# Patient Record
Sex: Female | Born: 1950 | Race: White | Hispanic: No | Marital: Married | Smoking: Never smoker
Health system: Southern US, Community
[De-identification: ages and names within clinical notes are randomized; demographics above are authoritative.]

## PROBLEM LIST (undated history)

## (undated) DIAGNOSIS — K589 Irritable bowel syndrome without diarrhea: Secondary | ICD-10-CM

## (undated) DIAGNOSIS — J45909 Unspecified asthma, uncomplicated: Secondary | ICD-10-CM

## (undated) DIAGNOSIS — C439 Malignant melanoma of skin, unspecified: Secondary | ICD-10-CM

## (undated) DIAGNOSIS — K8020 Calculus of gallbladder without cholecystitis without obstruction: Secondary | ICD-10-CM

## (undated) DIAGNOSIS — F329 Major depressive disorder, single episode, unspecified: Secondary | ICD-10-CM

## (undated) DIAGNOSIS — I493 Ventricular premature depolarization: Secondary | ICD-10-CM

## (undated) DIAGNOSIS — D649 Anemia, unspecified: Secondary | ICD-10-CM

## (undated) DIAGNOSIS — Z87440 Personal history of urinary (tract) infections: Secondary | ICD-10-CM

## (undated) DIAGNOSIS — M1990 Unspecified osteoarthritis, unspecified site: Secondary | ICD-10-CM

## (undated) DIAGNOSIS — N200 Calculus of kidney: Secondary | ICD-10-CM

## (undated) DIAGNOSIS — I10 Essential (primary) hypertension: Secondary | ICD-10-CM

## (undated) DIAGNOSIS — E119 Type 2 diabetes mellitus without complications: Secondary | ICD-10-CM

## (undated) DIAGNOSIS — M5030 Other cervical disc degeneration, unspecified cervical region: Secondary | ICD-10-CM

## (undated) DIAGNOSIS — K219 Gastro-esophageal reflux disease without esophagitis: Secondary | ICD-10-CM

## (undated) DIAGNOSIS — F32A Depression, unspecified: Secondary | ICD-10-CM

## (undated) DIAGNOSIS — Z86006 Personal history of melanoma in-situ: Secondary | ICD-10-CM

## (undated) DIAGNOSIS — Z8719 Personal history of other diseases of the digestive system: Secondary | ICD-10-CM

## (undated) DIAGNOSIS — B019 Varicella without complication: Secondary | ICD-10-CM

## (undated) DIAGNOSIS — E785 Hyperlipidemia, unspecified: Secondary | ICD-10-CM

## (undated) DIAGNOSIS — D049 Carcinoma in situ of skin, unspecified: Secondary | ICD-10-CM

## (undated) DIAGNOSIS — Z9889 Other specified postprocedural states: Secondary | ICD-10-CM

## (undated) DIAGNOSIS — E039 Hypothyroidism, unspecified: Secondary | ICD-10-CM

## (undated) DIAGNOSIS — T7840XA Allergy, unspecified, initial encounter: Secondary | ICD-10-CM

## (undated) HISTORY — DX: Other cervical disc degeneration, unspecified cervical region: M50.30

## (undated) HISTORY — DX: Personal history of urinary (tract) infections: Z87.440

## (undated) HISTORY — PX: RECTAL SURGERY: SHX760

## (undated) HISTORY — DX: Varicella without complication: B01.9

## (undated) HISTORY — DX: Other specified postprocedural states: Z98.890

## (undated) HISTORY — DX: Type 2 diabetes mellitus without complications: E11.9

## (undated) HISTORY — DX: Hypothyroidism, unspecified: E03.9

## (undated) HISTORY — DX: Carcinoma in situ of skin, unspecified: D04.9

## (undated) HISTORY — PX: COLONOSCOPY: SHX174

## (undated) HISTORY — PX: OTHER SURGICAL HISTORY: SHX169

## (undated) HISTORY — DX: Personal history of melanoma in-situ: Z86.006

## (undated) HISTORY — DX: Unspecified asthma, uncomplicated: J45.909

## (undated) HISTORY — DX: Calculus of kidney: N20.0

## (undated) HISTORY — DX: Malignant melanoma of skin, unspecified: C43.9

## (undated) HISTORY — DX: Irritable bowel syndrome, unspecified: K58.9

## (undated) HISTORY — DX: Personal history of other diseases of the digestive system: Z87.19

## (undated) HISTORY — DX: Gastro-esophageal reflux disease without esophagitis: K21.9

## (undated) HISTORY — DX: Allergy, unspecified, initial encounter: T78.40XA

## (undated) HISTORY — DX: Unspecified osteoarthritis, unspecified site: M19.90

## (undated) HISTORY — DX: Ventricular premature depolarization: I49.3

## (undated) HISTORY — PX: BREAST EXCISIONAL BIOPSY: SUR124

## (undated) HISTORY — DX: Hyperlipidemia, unspecified: E78.5

## (undated) HISTORY — DX: Depression, unspecified: F32.A

## (undated) HISTORY — DX: Essential (primary) hypertension: I10

## (undated) HISTORY — DX: Anemia, unspecified: D64.9

## (undated) HISTORY — DX: Calculus of gallbladder without cholecystitis without obstruction: K80.20

## (undated) HISTORY — DX: Major depressive disorder, single episode, unspecified: F32.9

---

## 1972-10-21 HISTORY — PX: ABDOMINAL HYSTERECTOMY: SHX81

## 1972-10-21 HISTORY — PX: BLADDER SURGERY: SHX569

## 1975-10-22 HISTORY — PX: APPENDECTOMY: SHX54

## 1985-10-21 HISTORY — PX: CHOLECYSTECTOMY: SHX55

## 1999-03-20 ENCOUNTER — Other Ambulatory Visit: Admission: RE | Admit: 1999-03-20 | Discharge: 1999-03-20 | Payer: Self-pay | Admitting: Family Medicine

## 1999-08-06 ENCOUNTER — Ambulatory Visit (HOSPITAL_COMMUNITY): Admission: RE | Admit: 1999-08-06 | Discharge: 1999-08-06 | Payer: Self-pay | Admitting: *Deleted

## 1999-08-06 ENCOUNTER — Encounter (INDEPENDENT_AMBULATORY_CARE_PROVIDER_SITE_OTHER): Payer: Self-pay

## 2000-07-01 ENCOUNTER — Other Ambulatory Visit: Admission: RE | Admit: 2000-07-01 | Discharge: 2000-07-01 | Payer: Self-pay | Admitting: *Deleted

## 2000-10-21 DIAGNOSIS — Z86006 Personal history of melanoma in-situ: Secondary | ICD-10-CM

## 2000-10-21 HISTORY — DX: Personal history of melanoma in-situ: Z86.006

## 2001-07-16 ENCOUNTER — Other Ambulatory Visit: Admission: RE | Admit: 2001-07-16 | Discharge: 2001-07-16 | Payer: Self-pay | Admitting: Obstetrics and Gynecology

## 2001-12-04 ENCOUNTER — Encounter: Admission: RE | Admit: 2001-12-04 | Discharge: 2001-12-04 | Payer: Self-pay | Admitting: Internal Medicine

## 2001-12-04 ENCOUNTER — Encounter: Payer: Self-pay | Admitting: Internal Medicine

## 2002-09-14 ENCOUNTER — Encounter (INDEPENDENT_AMBULATORY_CARE_PROVIDER_SITE_OTHER): Payer: Self-pay | Admitting: Specialist

## 2002-09-14 ENCOUNTER — Ambulatory Visit (HOSPITAL_BASED_OUTPATIENT_CLINIC_OR_DEPARTMENT_OTHER): Admission: RE | Admit: 2002-09-14 | Discharge: 2002-09-14 | Payer: Self-pay | Admitting: Plastic Surgery

## 2003-01-11 ENCOUNTER — Encounter: Payer: Self-pay | Admitting: Internal Medicine

## 2003-01-11 ENCOUNTER — Encounter: Admission: RE | Admit: 2003-01-11 | Discharge: 2003-01-11 | Payer: Self-pay | Admitting: Internal Medicine

## 2003-02-28 ENCOUNTER — Encounter: Admission: RE | Admit: 2003-02-28 | Discharge: 2003-02-28 | Payer: Self-pay | Admitting: Internal Medicine

## 2003-02-28 ENCOUNTER — Encounter: Payer: Self-pay | Admitting: Internal Medicine

## 2003-03-10 ENCOUNTER — Ambulatory Visit (HOSPITAL_BASED_OUTPATIENT_CLINIC_OR_DEPARTMENT_OTHER): Admission: RE | Admit: 2003-03-10 | Discharge: 2003-03-10 | Payer: Self-pay | Admitting: Urology

## 2004-02-23 ENCOUNTER — Other Ambulatory Visit: Admission: RE | Admit: 2004-02-23 | Discharge: 2004-02-23 | Payer: Self-pay | Admitting: Internal Medicine

## 2004-08-17 ENCOUNTER — Encounter: Admission: RE | Admit: 2004-08-17 | Discharge: 2004-08-17 | Payer: Self-pay | Admitting: Dermatology

## 2005-12-11 ENCOUNTER — Encounter: Admission: RE | Admit: 2005-12-11 | Discharge: 2005-12-11 | Payer: Self-pay | Admitting: Internal Medicine

## 2005-12-27 ENCOUNTER — Ambulatory Visit: Payer: Self-pay | Admitting: Internal Medicine

## 2006-01-07 ENCOUNTER — Ambulatory Visit: Payer: Self-pay | Admitting: Internal Medicine

## 2006-01-15 ENCOUNTER — Ambulatory Visit: Payer: Self-pay | Admitting: Internal Medicine

## 2006-02-17 ENCOUNTER — Ambulatory Visit: Payer: Self-pay | Admitting: Internal Medicine

## 2006-05-21 ENCOUNTER — Ambulatory Visit: Payer: Self-pay | Admitting: Family Medicine

## 2006-07-10 ENCOUNTER — Ambulatory Visit: Payer: Self-pay | Admitting: Family Medicine

## 2007-03-12 LAB — HM MAMMOGRAPHY

## 2007-05-25 ENCOUNTER — Encounter: Payer: Self-pay | Admitting: Family Medicine

## 2007-05-25 DIAGNOSIS — Z87442 Personal history of urinary calculi: Secondary | ICD-10-CM

## 2007-05-25 DIAGNOSIS — M1990 Unspecified osteoarthritis, unspecified site: Secondary | ICD-10-CM | POA: Insufficient documentation

## 2007-05-25 DIAGNOSIS — I10 Essential (primary) hypertension: Secondary | ICD-10-CM

## 2007-05-25 DIAGNOSIS — Z8739 Personal history of other diseases of the musculoskeletal system and connective tissue: Secondary | ICD-10-CM

## 2007-06-04 ENCOUNTER — Ambulatory Visit: Payer: Self-pay | Admitting: Family Medicine

## 2007-06-04 ENCOUNTER — Encounter: Payer: Self-pay | Admitting: Family Medicine

## 2007-06-04 ENCOUNTER — Other Ambulatory Visit: Admission: RE | Admit: 2007-06-04 | Discharge: 2007-06-04 | Payer: Self-pay | Admitting: Family Medicine

## 2007-06-04 DIAGNOSIS — H838X9 Other specified diseases of inner ear, unspecified ear: Secondary | ICD-10-CM

## 2007-06-04 DIAGNOSIS — F410 Panic disorder [episodic paroxysmal anxiety] without agoraphobia: Secondary | ICD-10-CM | POA: Insufficient documentation

## 2007-06-09 ENCOUNTER — Encounter: Payer: Self-pay | Admitting: Family Medicine

## 2007-06-10 LAB — CONVERTED CEMR LAB
ALT: 27 units/L (ref 0–35)
Albumin: 4 g/dL (ref 3.5–5.2)
Alkaline Phosphatase: 76 units/L (ref 39–117)
BUN: 6 mg/dL (ref 6–23)
Basophils Absolute: 0 10*3/uL (ref 0.0–0.1)
Basophils Relative: 0.5 % (ref 0.0–1.0)
Bilirubin, Direct: 0.1 mg/dL (ref 0.0–0.3)
Calcium: 9.4 mg/dL (ref 8.4–10.5)
Cholesterol: 187 mg/dL (ref 0–200)
Eosinophils Absolute: 0.1 10*3/uL (ref 0.0–0.6)
Eosinophils Relative: 2.1 % (ref 0.0–5.0)
GFR calc Af Amer: 111 mL/min
GFR calc non Af Amer: 92 mL/min
HCT: 40.6 % (ref 36.0–46.0)
Hemoglobin: 13.7 g/dL (ref 12.0–15.0)
LDL Cholesterol: 118 mg/dL — ABNORMAL HIGH (ref 0–99)
Lymphocytes Relative: 30.3 % (ref 12.0–46.0)
Monocytes Absolute: 0.7 10*3/uL (ref 0.2–0.7)
Neutro Abs: 2.5 10*3/uL (ref 1.4–7.7)
Neutrophils Relative %: 52 % (ref 43.0–77.0)
Potassium: 3.8 meq/L (ref 3.5–5.1)
RDW: 12.7 % (ref 11.5–14.6)
Total CHOL/HDL Ratio: 3.6
Total Protein: 7.2 g/dL (ref 6.0–8.3)
VLDL: 17 mg/dL (ref 0–40)

## 2007-07-15 ENCOUNTER — Telehealth: Payer: Self-pay

## 2007-07-22 ENCOUNTER — Telehealth: Payer: Self-pay | Admitting: Family Medicine

## 2007-09-10 ENCOUNTER — Ambulatory Visit: Payer: Self-pay | Admitting: Family Medicine

## 2007-12-04 ENCOUNTER — Encounter: Payer: Self-pay | Admitting: Family Medicine

## 2007-12-04 ENCOUNTER — Telehealth: Payer: Self-pay | Admitting: Internal Medicine

## 2007-12-04 ENCOUNTER — Ambulatory Visit: Payer: Self-pay | Admitting: Internal Medicine

## 2007-12-04 LAB — CONVERTED CEMR LAB
BUN: 8 mg/dL (ref 6–23)
CO2: 31 meq/L (ref 19–32)
Calcium: 9.8 mg/dL (ref 8.4–10.5)
Glucose, Bld: 88 mg/dL (ref 70–99)
Potassium: 4.4 meq/L (ref 3.5–5.1)
Sodium: 143 meq/L (ref 135–145)
T3, Free: 3 pg/mL (ref 2.3–4.2)

## 2007-12-15 ENCOUNTER — Encounter: Payer: Self-pay | Admitting: Pulmonary Disease

## 2007-12-15 ENCOUNTER — Encounter: Payer: Self-pay | Admitting: Family Medicine

## 2007-12-15 ENCOUNTER — Ambulatory Visit: Payer: Self-pay

## 2007-12-15 ENCOUNTER — Encounter: Payer: Self-pay | Admitting: Internal Medicine

## 2007-12-16 ENCOUNTER — Ambulatory Visit: Payer: Self-pay | Admitting: Family Medicine

## 2007-12-16 DIAGNOSIS — E039 Hypothyroidism, unspecified: Secondary | ICD-10-CM | POA: Insufficient documentation

## 2007-12-16 LAB — CONVERTED CEMR LAB: Vit D, 1,25-Dihydroxy: 41 (ref 30–89)

## 2008-01-14 ENCOUNTER — Ambulatory Visit: Payer: Self-pay | Admitting: Family Medicine

## 2008-01-18 LAB — CONVERTED CEMR LAB: TSH: 3.73 microintl units/mL (ref 0.35–5.50)

## 2008-02-01 ENCOUNTER — Telehealth: Payer: Self-pay | Admitting: Family Medicine

## 2008-03-24 ENCOUNTER — Telehealth: Payer: Self-pay | Admitting: Family Medicine

## 2008-03-31 ENCOUNTER — Ambulatory Visit: Payer: Self-pay | Admitting: Family Medicine

## 2008-03-31 DIAGNOSIS — I479 Paroxysmal tachycardia, unspecified: Secondary | ICD-10-CM | POA: Insufficient documentation

## 2008-04-13 ENCOUNTER — Telehealth: Payer: Self-pay | Admitting: Family Medicine

## 2008-04-13 ENCOUNTER — Encounter: Payer: Self-pay | Admitting: Family Medicine

## 2008-05-05 ENCOUNTER — Encounter: Payer: Self-pay | Admitting: Family Medicine

## 2008-05-05 ENCOUNTER — Encounter: Admission: RE | Admit: 2008-05-05 | Discharge: 2008-05-05 | Payer: Self-pay | Admitting: Dermatology

## 2008-05-19 ENCOUNTER — Ambulatory Visit: Payer: Self-pay | Admitting: Family Medicine

## 2008-05-19 DIAGNOSIS — R079 Chest pain, unspecified: Secondary | ICD-10-CM | POA: Insufficient documentation

## 2008-05-19 DIAGNOSIS — F419 Anxiety disorder, unspecified: Secondary | ICD-10-CM | POA: Insufficient documentation

## 2008-05-19 DIAGNOSIS — R0602 Shortness of breath: Secondary | ICD-10-CM

## 2008-05-19 DIAGNOSIS — F438 Other reactions to severe stress: Secondary | ICD-10-CM

## 2008-05-19 DIAGNOSIS — H04129 Dry eye syndrome of unspecified lacrimal gland: Secondary | ICD-10-CM | POA: Insufficient documentation

## 2008-05-25 ENCOUNTER — Telehealth: Payer: Self-pay | Admitting: Family Medicine

## 2008-06-09 ENCOUNTER — Ambulatory Visit: Payer: Self-pay | Admitting: Family Medicine

## 2008-06-16 ENCOUNTER — Telehealth: Payer: Self-pay | Admitting: Family Medicine

## 2008-06-22 ENCOUNTER — Encounter: Payer: Self-pay | Admitting: Family Medicine

## 2008-06-22 ENCOUNTER — Ambulatory Visit: Payer: Self-pay

## 2008-06-23 ENCOUNTER — Ambulatory Visit: Payer: Self-pay | Admitting: Family Medicine

## 2008-06-28 ENCOUNTER — Telehealth: Payer: Self-pay

## 2008-06-29 ENCOUNTER — Telehealth: Payer: Self-pay | Admitting: Critical Care Medicine

## 2008-06-29 ENCOUNTER — Ambulatory Visit: Payer: Self-pay | Admitting: Family Medicine

## 2008-06-29 ENCOUNTER — Telehealth: Payer: Self-pay | Admitting: Family Medicine

## 2008-06-29 DIAGNOSIS — R062 Wheezing: Secondary | ICD-10-CM

## 2008-06-30 ENCOUNTER — Ambulatory Visit: Payer: Self-pay | Admitting: Pulmonary Disease

## 2008-07-08 ENCOUNTER — Ambulatory Visit: Payer: Self-pay | Admitting: Pulmonary Disease

## 2008-07-11 ENCOUNTER — Telehealth (INDEPENDENT_AMBULATORY_CARE_PROVIDER_SITE_OTHER): Payer: Self-pay | Admitting: *Deleted

## 2008-07-15 ENCOUNTER — Ambulatory Visit: Payer: Self-pay | Admitting: Pulmonary Disease

## 2008-08-05 ENCOUNTER — Telehealth: Payer: Self-pay | Admitting: Family Medicine

## 2008-08-09 ENCOUNTER — Encounter: Payer: Self-pay | Admitting: Family Medicine

## 2008-08-30 ENCOUNTER — Encounter: Payer: Self-pay | Admitting: Family Medicine

## 2008-11-24 ENCOUNTER — Ambulatory Visit: Payer: Self-pay | Admitting: Family Medicine

## 2008-11-24 DIAGNOSIS — Z9109 Other allergy status, other than to drugs and biological substances: Secondary | ICD-10-CM

## 2008-12-05 ENCOUNTER — Telehealth: Payer: Self-pay

## 2009-03-24 ENCOUNTER — Encounter: Payer: Self-pay | Admitting: Family Medicine

## 2009-06-29 ENCOUNTER — Encounter: Payer: Self-pay | Admitting: Family Medicine

## 2009-07-14 ENCOUNTER — Encounter: Payer: Self-pay | Admitting: Family Medicine

## 2009-07-15 ENCOUNTER — Encounter: Payer: Self-pay | Admitting: Family Medicine

## 2009-07-17 ENCOUNTER — Telehealth: Payer: Self-pay | Admitting: Family Medicine

## 2009-07-20 ENCOUNTER — Ambulatory Visit: Payer: Self-pay | Admitting: Family Medicine

## 2009-07-20 DIAGNOSIS — E876 Hypokalemia: Secondary | ICD-10-CM

## 2009-07-28 LAB — CONVERTED CEMR LAB
Chloride: 103 meq/L (ref 96–112)
Sodium: 141 meq/L (ref 135–145)

## 2009-08-17 ENCOUNTER — Encounter: Payer: Self-pay | Admitting: Family Medicine

## 2009-08-17 ENCOUNTER — Ambulatory Visit: Payer: Self-pay | Admitting: Family Medicine

## 2009-08-17 ENCOUNTER — Other Ambulatory Visit: Admission: RE | Admit: 2009-08-17 | Discharge: 2009-08-17 | Payer: Self-pay | Admitting: Family Medicine

## 2009-08-17 LAB — CONVERTED CEMR LAB
Bilirubin Urine: NEGATIVE
Ketones, urine, test strip: NEGATIVE
Protein, U semiquant: NEGATIVE
WBC Urine, dipstick: NEGATIVE
pH: 7

## 2009-08-17 LAB — HM PAP SMEAR

## 2009-08-21 LAB — CONVERTED CEMR LAB: Vit D, 25-Hydroxy: 36 ng/mL (ref 30–89)

## 2009-08-22 ENCOUNTER — Telehealth: Payer: Self-pay | Admitting: Family Medicine

## 2009-08-23 ENCOUNTER — Encounter: Payer: Self-pay | Admitting: Family Medicine

## 2009-08-24 LAB — CONVERTED CEMR LAB
ALT: 20 units/L (ref 0–35)
Alkaline Phosphatase: 62 units/L (ref 39–117)
Basophils Absolute: 0 10*3/uL (ref 0.0–0.1)
Bilirubin, Direct: 0.1 mg/dL (ref 0.0–0.3)
CO2: 30 meq/L (ref 19–32)
Chloride: 100 meq/L (ref 96–112)
Cholesterol: 221 mg/dL — ABNORMAL HIGH (ref 0–200)
Creatinine, Ser: 0.6 mg/dL (ref 0.4–1.2)
HCT: 41.7 % (ref 36.0–46.0)
Hemoglobin: 14.1 g/dL (ref 12.0–15.0)
MCV: 91.8 fL (ref 78.0–100.0)
Neutrophils Relative %: 52.2 % (ref 43.0–77.0)
Platelets: 192 10*3/uL (ref 150.0–400.0)
RBC: 4.54 M/uL (ref 3.87–5.11)
RDW: 12.7 % (ref 11.5–14.6)
Sodium: 138 meq/L (ref 135–145)
Total CHOL/HDL Ratio: 4
Total Protein: 7.7 g/dL (ref 6.0–8.3)

## 2009-08-31 ENCOUNTER — Telehealth: Payer: Self-pay | Admitting: Family Medicine

## 2009-09-21 ENCOUNTER — Telehealth: Payer: Self-pay | Admitting: Family Medicine

## 2009-09-26 ENCOUNTER — Telehealth: Payer: Self-pay | Admitting: Family Medicine

## 2009-10-04 ENCOUNTER — Ambulatory Visit: Payer: Self-pay | Admitting: Family Medicine

## 2009-10-04 DIAGNOSIS — R609 Edema, unspecified: Secondary | ICD-10-CM | POA: Insufficient documentation

## 2009-10-05 ENCOUNTER — Encounter: Payer: Self-pay | Admitting: Family Medicine

## 2009-10-05 ENCOUNTER — Telehealth: Payer: Self-pay | Admitting: Family Medicine

## 2009-10-06 LAB — CONVERTED CEMR LAB: Potassium: 4.2 meq/L (ref 3.5–5.3)

## 2009-10-10 ENCOUNTER — Telehealth: Payer: Self-pay | Admitting: Family Medicine

## 2009-10-11 ENCOUNTER — Encounter: Payer: Self-pay | Admitting: Family Medicine

## 2009-10-12 ENCOUNTER — Encounter: Payer: Self-pay | Admitting: Family Medicine

## 2009-11-09 ENCOUNTER — Encounter: Payer: Self-pay | Admitting: Family Medicine

## 2009-11-14 ENCOUNTER — Encounter: Payer: Self-pay | Admitting: Family Medicine

## 2009-11-21 ENCOUNTER — Telehealth: Payer: Self-pay | Admitting: Family Medicine

## 2009-11-22 ENCOUNTER — Encounter: Payer: Self-pay | Admitting: Family Medicine

## 2009-11-27 ENCOUNTER — Encounter: Payer: Self-pay | Admitting: Family Medicine

## 2009-11-28 ENCOUNTER — Encounter: Payer: Self-pay | Admitting: Family Medicine

## 2009-12-04 ENCOUNTER — Encounter: Payer: Self-pay | Admitting: Family Medicine

## 2009-12-06 ENCOUNTER — Encounter: Payer: Self-pay | Admitting: Family Medicine

## 2010-01-01 ENCOUNTER — Telehealth (INDEPENDENT_AMBULATORY_CARE_PROVIDER_SITE_OTHER): Payer: Self-pay | Admitting: *Deleted

## 2010-01-03 ENCOUNTER — Ambulatory Visit (HOSPITAL_COMMUNITY): Admission: RE | Admit: 2010-01-03 | Discharge: 2010-01-03 | Payer: Self-pay | Admitting: General Surgery

## 2010-01-03 ENCOUNTER — Encounter: Payer: Self-pay | Admitting: Family Medicine

## 2010-01-16 ENCOUNTER — Encounter: Payer: Self-pay | Admitting: Family Medicine

## 2010-02-14 ENCOUNTER — Telehealth: Payer: Self-pay | Admitting: Family Medicine

## 2010-05-31 ENCOUNTER — Ambulatory Visit: Payer: Self-pay | Admitting: Family Medicine

## 2010-06-06 ENCOUNTER — Telehealth: Payer: Self-pay | Admitting: Family Medicine

## 2010-06-07 ENCOUNTER — Ambulatory Visit: Payer: Self-pay | Admitting: Family Medicine

## 2010-06-07 DIAGNOSIS — M79609 Pain in unspecified limb: Secondary | ICD-10-CM | POA: Insufficient documentation

## 2010-06-07 LAB — CONVERTED CEMR LAB: Potassium: 3.9 meq/L (ref 3.5–5.1)

## 2010-06-13 ENCOUNTER — Telehealth: Payer: Self-pay | Admitting: Family Medicine

## 2010-06-22 ENCOUNTER — Encounter: Payer: Self-pay | Admitting: Family Medicine

## 2010-07-20 ENCOUNTER — Encounter: Payer: Self-pay | Admitting: Family Medicine

## 2010-08-20 ENCOUNTER — Telehealth: Payer: Self-pay | Admitting: Family Medicine

## 2010-11-10 ENCOUNTER — Encounter: Payer: Self-pay | Admitting: Internal Medicine

## 2010-11-19 ENCOUNTER — Telehealth: Payer: Self-pay | Admitting: Family Medicine

## 2010-11-20 NOTE — Progress Notes (Signed)
Summary: meds are working  Phone Note Call from Patient   Caller: Patient Call For: Judithann Sheen MD Summary of Call: Pt is calling to let Dr.Stafford the Diazepam is working.  [phone] Initial call taken by: Lynann Beaver CMA,  June 13, 2010 12:22 PM  Follow-up for Phone Call        please denies panic or Follow-up by: Judithann Sheen MD,  June 17, 2010 2:11 PM

## 2010-11-20 NOTE — Progress Notes (Signed)
  Phone Note From Other Clinic   Caller: [facility] Details for Reason: Pt.Information Initial call taken by: Joice Lofts LOV,Stress over to Rosalio Macadamia @ [phone] [facility]  January 01, 2010 9:25 AM

## 2010-11-20 NOTE — Progress Notes (Signed)
Summary: lab results  Phone Note Call from Patient   Caller: Patient Call For: Judithann Sheen MD Summary of Call: [phone] Needs lab results Initial call taken by: [facility] CMA,  June 06, 2010 11:05 AM  Follow-up for Phone Call        k+ 3.9 and is ok.  Follow-up by: Pura Spice, RN,  June 06, 2010 2:07 PM     Appended Document: lab results pt aware..........gh rn......Marland Kitchen

## 2010-11-20 NOTE — Op Note (Signed)
Summary: Ultrasound Guided Core Biopsy of Breast/[facility]  Ultrasound Guided Core Biopsy of Breast/[facility]   Imported By: Maryln Gottron 12/27/2009 14:11:21  _____________________________________________________________________  External Attachment:    Type:   Image     Comment:   External Document

## 2010-11-20 NOTE — Assessment & Plan Note (Signed)
Summary: LEG PAIN/NJR   Vital Signs:  Patient profile:   60 year old female Menstrual status:  postmenopausal Weight:      163 pounds BMI:     28.08 O2 Sat:      98 % Temp:     98.3 degrees F Pulse rate:   62 / minute Pulse rhythm:   regular BP sitting:   130 / 80  (left arm)  Vitals Entered By: Pura Spice, RN (June 07, 2010 1:11 PM) CC: c/o legs aching    History of Present Illness: This 60 year old white married female is complaining of leg pain as well as some muscle like to pain all over ear does have cramping muscle pain has increased attachment site which has not helped Blood pressure then well controlled 2 get lab studies to help evaluate etiology of muscle cramps Continues to take Restasis for dry eyes Does not have panic attacks as in the past Rarely has episodes of labyrinthitis Russell edema is related to long she takes her diuretic Continues to have anxiety and feels stressed  Allergies: 1)  ! Codeine Phosphate (Codeine Phosphate) 2)  ! Iodine (Iodine)  Past History:  Past Medical History: Last updated: 05/25/2007 hx melanoma  2002 Hypertension hx kidney calculi Nephrolithiasis, hx of Osteoarthritis  Past Surgical History: Last updated: 05/25/2007 Appendectomy  1981 Cholecystectomy  1991 Hysterectomy  1973  Social History: Last updated: 06/30/2008 40  hours work Never Smoked 1/2 c -1 perday Alcohol use-no Married 6-7 hours.  pt is a lic. optician.  Risk Factors: Smoking Status: never (08/17/2009)  Review of Systems      See HPI  The patient denies anorexia, fever, weight loss, weight gain, vision loss, decreased hearing, hoarseness, chest pain, syncope, dyspnea on exertion, peripheral edema, prolonged cough, headaches, hemoptysis, abdominal pain, melena, hematochezia, severe indigestion/heartburn, hematuria, incontinence, genital sores, muscle weakness, suspicious skin lesions, transient blindness, difficulty walking, depression,  unusual weight change, abnormal bleeding, enlarged lymph nodes, angioedema, breast masses, and testicular masses.    Physical Exam  General:  Well-developed,well-nourished,in no acute distress; alert,appropriate and cooperative throughout examination Head:  Normocephalic and atraumatic without obvious abnormalities. No apparent alopecia or balding. Eyes:  No corneal or conjunctival inflammation noted. EOMI. Perrla. Funduscopic exam benign, without hemorrhages, exudates or papilledema. Vision grossly normal. Ears:  External ear exam shows no significant lesions or deformities.  Otoscopic examination reveals clear canals, tympanic membranes are intact bilaterally without bulging, retraction, inflammation or discharge. Hearing is grossly normal bilaterally. Nose:  External nasal examination shows no deformity or inflammation. Nasal mucosa are pink and moist without lesions or exudates. Mouth:  Oral mucosa and oropharynx without lesions or exudates.  Teeth in good repair. Neck:  No deformities, masses, or tenderness noted. Lungs:  Normal respiratory effort, chest expands symmetrically. Lungs are clear to auscultation, no crackles or wheezes. Heart:  Normal rate and regular rhythm. S1 and S2 normal without gallop, murmur, click, rub or other extra sounds. Abdomen:  Bowel sounds positive,abdomen soft and non-tender without masses, organomegaly or hernias noted. Extremities:  trace of pretibial edema bilaterally Neurologic:  No cranial nerve deficits noted. Station and gait are normal. Plantar reflexes are down-going bilaterally. DTRs are symmetrical throughout. Sensory, motor and coordinative functions appear intact.   Impression & Recommendations:  Problem # 1:  LEG PAIN, BILATERAL (ICD-729.5) Assessment New diazepam 5 mg t.i.d.  Problem # 2:  LEG EDEMA, BILATERAL (ICD-782.3) Assessment: Improved  The following medications were removed from the medication list:  Maxzide-25 37.5-25 Mg Tabs  (Triamterene-hctz) .Marland Kitchen... 23 stat then 1 once daily to pre vent fluid retention Her updated medication list for this problem includes:    Hydrochlorothiazide 25 Mg Tabs (Hydrochlorothiazide) .Marland Kitchen... 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  Problem # 4:  OSTEOARTHRITIS (ICD-715.90)  The following medications were removed from the medication list:    Ibuprofen 800 Mg Tabs (Ibuprofen) .Marland Kitchen... 1 three times a day pc for leg pains  Problem # 5:  WHEEZING (ICD-786.07) Assessment: Improved ProAir as needed  Problem # 6:  ANXIETY, SITUATIONAL (ICD-308.3) Assessment: Unchanged diazepam 5 mg t.i.d.  Problem # 7:  HYPERTENSION (ICD-401.9) Assessment: Improved  The following medications were removed from the medication list:    Catapres 0.1 Mg Tabs (Clonidine hcl) .Marland Kitchen... 1 tab q4-6 h prnelevated  blood presure    Maxzide-25 37.5-25 Mg Tabs (Triamterene-hctz) .Marland Kitchen... 23 stat then 1 once daily to pre vent fluid retention    Avapro 300 Mg Tabs (Irbesartan) .Marland Kitchen... 1 once daily for blood pressure Her updated medication list for this problem includes:    Hydrochlorothiazide 25 Mg Tabs (Hydrochlorothiazide) .Marland Kitchen... 1 once daily for bp    Bystolic 5 Mg Tabs (Nebivolol hcl) .Marland Kitchen... Take 1 by mouth once daily  Complete Medication List: 1)  Lorazepam 1 Mg Tabs (Lorazepam) .Marland Kitchen.. 1 by mouth three times a day as needed stress 2)  Restasis 0.05 % Emul (Cyclosporine) .... As needed 3)  Multivitamins Caps (Multiple vitamin) 4)  Fish Oil 1000 Mg Caps (Omega-3 fatty acids) 5)  Flax Seed Oil 1000 Mg Caps (Flaxseed (linseed)) 6)  Vivelle-dot 0.1 Mg/24hr Pttw (Estradiol) .... Apply 1 patch twcie week 7)  Acyclovir 400 Mg Tabs (Acyclovir) .Marland Kitchen.. 1 tab once daily 8)  Qvar 80 Mcg/act Aers (Beclomethasone dipropionate) 9)  Nasonex 50 Mcg/act Susp (Mometasone furoate) 10)  Proair Hfa 108 (90 Base) Mcg/act Aers (Albuterol sulfate) 11)  Kaon-cl-10 10 Meq Cr-tabs  (Potassium chloride) .Marland Kitchen.. 1 qd 12)  Hydrochlorothiazide 25 Mg Tabs (Hydrochlorothiazide) .Marland Kitchen.. 1 once daily for bp 13)  Bystolic 5 Mg Tabs (Nebivolol hcl) .... Take 1 by mouth once daily 14)  Diazepam 5 Mg Tabs (Diazepam) .Marland Kitchen.. 1 morn midafternoon and hs for stress,do not take lorazepam when taking diazepam  Patient Instructions: 1)  it appears the leg cramps and muscle aches were related to your stress in her life he did take diazepam 5 mg 3 times a day both for anxiety and stress as well as full muscle spasm 2)  Call as far as results Prescriptions: DIAZEPAM 5 MG TABS (DIAZEPAM) 1 morn midafternoon and hs for stress,do not take lorazepam when taking diazepam  #90 x 5   Entered and Authorized by:   Judithann Sheen MD   Signed by:   Judithann Sheen MD on 06/07/2010   Method used:   Print then Give to Patient   RxID:   [id number]

## 2010-11-20 NOTE — Letter (Signed)
Summary: [facility]   Imported By: Maryln Gottron 01/25/2010 14:08:13  _____________________________________________________________________  External Attachment:    Type:   Image     Comment:   External Document

## 2010-11-20 NOTE — Progress Notes (Signed)
Summary: avalide recalled  Phone Note Call from Patient Call back at [phone]   Call For: Amber Brewer Summary of Call: [name] works, but was just recalled.  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  Have 2 [name] 300/25 left.  Need a different Rx that I haven't tried/gotten reactions to.  [facility] [address].  Allergic or intolerant to codeine & sulfa.  All BP meds tried have been unable to take or [employer]..   Initial call taken by: Rudy Jew, RN,  November 21, 2009 9:26 AM  Follow-up for Phone Call        change prescription

## 2010-11-20 NOTE — Progress Notes (Signed)
Summary: sinus - zpak  Phone Note Call from Patient Call back at [phone]   Caller: vm Summary of Call: Sinus infection.  Green mucus. Stuffy.  Headache.   Front of face.   Request Zpak.  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  [facility] [address] [facility].    Initial call taken by: Rudy Jew, RN,  August 20, 2010 3:44 PM  Follow-up for Phone Call        OK to give one course of Azithromycin but if no response needs to be seen. Azithromycin 250mg  tabs 2 tabs by mouth once and then 1 tab by mouth once daily x 4 days, take plain Mucinex 600mg  tab 1 tab by mouth two times a day x 10 days as well and drink plenty of fluids Follow-up by: Danise Edge MD,  August 20, 2010 3:55 PM  Additional Follow-up for Phone Call Additional follow up Details #1::        Left a message for pt to return my call Additional Follow-up by: Josph Macho RMA,  August 21, 2010 9:10 AM    Additional Follow-up for Phone Call Additional follow up Details #2::    Pt informed Follow-up by: Josph Macho RMA,  August 21, 2010 4:19 PM  New/Updated Medications: AZITHROMYCIN 250 MG TABS (AZITHROMYCIN) 2 tabs by mouth once then 1 tab by mouth daily X4 days Prescriptions: AZITHROMYCIN 250 MG TABS (AZITHROMYCIN) 2 tabs by mouth once then 1 tab by mouth daily X4 days  #5 x 0   Entered by:   Josph Macho RMA   Authorized by:   Danise Edge MD   Signed by:   Josph Macho RMA on 08/21/2010   Method used:   Electronically to        [facility] [address] [id number]* (retail)       [address] [place], [place]  [address]       Ph: [phone]       Fax: [phone]   RxID:   [id number]

## 2010-11-20 NOTE — Op Note (Signed)
Summary: Biopsy Rt. Breast/[facility]  Biopsy Rt. Breast[facility]   Imported By: Maryln Gottron 12/12/2009 13:03:12  _____________________________________________________________________  External Attachment:    Type:   Image     Comment:   External Document

## 2010-11-20 NOTE — Letter (Signed)
Summary: [facility]   Imported By: Maryln Gottron 07/04/2009 14:35:02  _____________________________________________________________________  External Attachment:    Type:   Image     Comment:   External Document

## 2010-11-20 NOTE — Progress Notes (Signed)
Summary: refill info will come  Phone Note Call from Patient Call back at [phone]   Summary of Call: Bystolic 5mg  one daily given by cardiologist.  Needs Dr. Satira Sark to give refill to [facility].  Initial call taken by: Rudy Jew, RN,  February 14, 2010 9:19 AM  Follow-up for Phone Call        we have no record in her med list she is on this med. We will need to see some records from the  cardiologist to vertify she is on this med before we can refill it  Sheria will work on this.  Some kind of fax or phone will come. Rudy Jew, RN  February 14, 2010 9:40 AM  Follow-up by: Pura Spice, RN,  February 14, 2010 9:29 AM  Additional Follow-up for Phone Call Additional follow up Details #1::        ok will wait on notes to see who prescribed.  Additional Follow-up by: Pura Spice, RN,  February 14, 2010 3:16 PM     Appended Document: refill info will come  bystolic 5mg  rx

## 2010-11-20 NOTE — Op Note (Signed)
Summary: Right Breast Needle Localization/[facility]  Right Breast Needle Localization/[facility]   Imported By: Maryln Gottron 01/25/2010 13:12:56  _____________________________________________________________________  External Attachment:    Type:   Image     Comment:   External Document

## 2010-11-20 NOTE — Letter (Signed)
Summary: [facility]   Imported By: Maryln Gottron 02/16/2010 13:51:39  _____________________________________________________________________  External Attachment:    Type:   Image     Comment:   External Document

## 2010-11-28 NOTE — Progress Notes (Signed)
Summary: REFILL REQUEST  Phone Note Call from Patient Message from:  Patient on November 19, 2010 4:18 PM  Caller: Patient Reason for Call: Refill Medication Summary of Call: Pt states that she is still taking the [facility]  along with the HYDROCHLOROTHIAZIDE   and    BYSTOLIC.... Would like Rx for med to be sent to [facility], [facility]...Marland KitchenMarland KitchenMarland Kitchen Pt is currently out of medication.  Initial call taken by: Debbra Riding,  November 19, 2010 4:21 PM  Follow-up for Phone Call        Rx sent for HCTZ and Bystolic. I called [facility] and cancelled this rx then sent rx to [facility].  Pt states that she is still taking Avapro. Can we send this in as well? Follow-up by: Romualdo Bolk, CMA Duncan Dull),  November 20, 2010 12:47 PM  Additional Follow-up for Phone Call Additional follow up Details #1::        call in prescription for Avapro Additional Follow-up by: Judithann Sheen MD,  November 21, 2010 1:47 PM    Prescriptions: BYSTOLIC 5 MG TABS (NEBIVOLOL HCL) take 1 by mouth once daily  #30 x 2   Entered by:   Romualdo Bolk, CMA (AAMA)   Authorized by:   Judithann Sheen MD   Signed by:   Romualdo Bolk, CMA (AAMA) on 11/20/2010   Method used:   Electronically to        [id number]* (retail)       [address]       [place], [place]  [address]       Ph: [phone] or [phone]       Fax: [phone]   RxID:   [id number] BYSTOLIC 5 MG TABS (NEBIVOLOL HCL) take 1 by mouth once daily  #30 x 2   Entered by:   Romualdo Bolk, CMA (AAMA)   Authorized by:   Judithann Sheen MD   Signed by:   Romualdo Bolk, CMA (AAMA) on 11/20/2010   Method used:   Electronically to        [id number]* (retail)       [address] [place], [place]  [address]       Ph: [phone]       Fax: [phone]   RxID:   [id number]

## 2010-12-03 ENCOUNTER — Other Ambulatory Visit: Payer: Self-pay | Admitting: Family Medicine

## 2010-12-12 ENCOUNTER — Other Ambulatory Visit: Payer: Self-pay | Admitting: Family Medicine

## 2010-12-17 ENCOUNTER — Other Ambulatory Visit: Payer: Self-pay | Admitting: Family Medicine

## 2011-01-07 ENCOUNTER — Encounter: Payer: Self-pay | Admitting: Family Medicine

## 2011-01-14 LAB — DIFFERENTIAL
Basophils Relative: 1 % (ref 0–1)
Eosinophils Absolute: 0.1 10*3/uL (ref 0.0–0.7)
Eosinophils Relative: 1 % (ref 0–5)
Lymphs Abs: 1.3 10*3/uL (ref 0.7–4.0)
Monocytes Absolute: 0.7 10*3/uL (ref 0.1–1.0)
Monocytes Relative: 16 % — ABNORMAL HIGH (ref 3–12)

## 2011-01-14 LAB — BASIC METABOLIC PANEL
CO2: 29 mEq/L (ref 19–32)
Calcium: 10.1 mg/dL (ref 8.4–10.5)
Creatinine, Ser: 0.73 mg/dL (ref 0.4–1.2)
GFR calc Af Amer: >60 mL/min (ref >60)
GFR calc non Af Amer: >60 mL/min (ref >60)
Glucose, Bld: 106 mg/dL — ABNORMAL HIGH (ref 70–99)

## 2011-01-14 LAB — CBC
MCHC: 34.3 g/dL (ref 30.0–36.0)
RDW: 13 % (ref 11.5–15.5)

## 2011-01-15 ENCOUNTER — Other Ambulatory Visit (INDEPENDENT_AMBULATORY_CARE_PROVIDER_SITE_OTHER): Payer: BC Managed Care – PPO | Admitting: Family Medicine

## 2011-01-15 DIAGNOSIS — E785 Hyperlipidemia, unspecified: Secondary | ICD-10-CM

## 2011-01-15 DIAGNOSIS — Z0000 Encounter for general adult medical examination without abnormal findings: Secondary | ICD-10-CM

## 2011-01-15 LAB — CBC WITH DIFFERENTIAL/PLATELET
Basophils Absolute: 0 10*3/uL (ref 0.0–0.1)
HCT: 39.6 % (ref 36.0–46.0)
Lymphs Abs: 1.6 10*3/uL (ref 0.7–4.0)
Monocytes Relative: 15 % — ABNORMAL HIGH (ref 3.0–12.0)
Neutrophils Relative %: 50.1 % (ref 43.0–77.0)
Platelets: 197 10*3/uL (ref 150.0–400.0)
RDW: 13.5 % (ref 11.5–14.6)
WBC: 5.1 10*3/uL (ref 4.5–10.5)

## 2011-01-15 LAB — LIPID PANEL
Cholesterol: 218 mg/dL — ABNORMAL HIGH (ref 0–200)
Total CHOL/HDL Ratio: 4
Triglycerides: 113 mg/dL (ref 0.0–149.0)
VLDL: 22.6 mg/dL (ref 0.0–40.0)

## 2011-01-15 LAB — HEPATIC FUNCTION PANEL
AST: 23 U/L (ref 0–37)
Bilirubin, Direct: 0.1 mg/dL (ref 0.0–0.3)
Total Bilirubin: 0.7 mg/dL (ref 0.3–1.2)

## 2011-01-15 LAB — BASIC METABOLIC PANEL
BUN: 14 mg/dL (ref 6–23)
Calcium: 9.9 mg/dL (ref 8.4–10.5)
Creatinine, Ser: 0.8 mg/dL (ref 0.4–1.2)
GFR: 77.76 mL/min (ref >60.00)
Glucose, Bld: 100 mg/dL — ABNORMAL HIGH (ref 70–99)
Potassium: 4.9 mEq/L (ref 3.5–5.1)

## 2011-01-15 LAB — POCT URINALYSIS DIPSTICK
Blood, UA: NEGATIVE
Glucose, UA: NEGATIVE
Nitrite, UA: NEGATIVE
Urobilinogen, UA: 0.2
pH, UA: 6

## 2011-01-16 ENCOUNTER — Other Ambulatory Visit: Payer: Self-pay | Admitting: Family Medicine

## 2011-01-22 ENCOUNTER — Ambulatory Visit (INDEPENDENT_AMBULATORY_CARE_PROVIDER_SITE_OTHER): Payer: BC Managed Care – PPO | Admitting: Family Medicine

## 2011-01-22 ENCOUNTER — Encounter: Payer: Self-pay | Admitting: Family Medicine

## 2011-01-22 VITALS — BP 120/70 | HR 72 | Temp 98.2°F | Resp 14 | Ht 64.5 in | Wt 172.0 lb

## 2011-01-22 DIAGNOSIS — E039 Hypothyroidism, unspecified: Secondary | ICD-10-CM

## 2011-01-22 DIAGNOSIS — F410 Panic disorder [episodic paroxysmal anxiety] without agoraphobia: Secondary | ICD-10-CM

## 2011-01-22 DIAGNOSIS — Z0000 Encounter for general adult medical examination without abnormal findings: Secondary | ICD-10-CM

## 2011-01-22 DIAGNOSIS — B009 Herpesviral infection, unspecified: Secondary | ICD-10-CM

## 2011-01-22 DIAGNOSIS — I10 Essential (primary) hypertension: Secondary | ICD-10-CM

## 2011-01-22 MED ORDER — LEVOTHYROXINE SODIUM 50 MCG PO TABS: 50.0000 ug | ORAL_TABLET | Freq: Every day | ORAL | Status: DC

## 2011-01-22 NOTE — Patient Instructions (Addendum)
You are in general doing fine Lose some weightand continue to exercise Start synthroid 50 mcg each day Schedule TSH in 6 weeks I will call reults

## 2011-01-28 ENCOUNTER — Telehealth: Payer: Self-pay | Admitting: *Deleted

## 2011-01-28 NOTE — Telephone Encounter (Signed)
Pt needs to know when her last tetanus was. Pt stepped on rusty nail. Pls call back asap today.

## 2011-01-28 NOTE — Telephone Encounter (Signed)
Spoke with pt she is coming in for a tetanus on 01/29/2011

## 2011-01-28 NOTE — Telephone Encounter (Signed)
Pt. Stepped on a rusty nail this weekend, and needs to know if she needs to come in for a Tetanus. Last TD 10/2002 May leave a message.

## 2011-01-29 ENCOUNTER — Ambulatory Visit (INDEPENDENT_AMBULATORY_CARE_PROVIDER_SITE_OTHER): Payer: BC Managed Care – PPO | Admitting: Family Medicine

## 2011-01-29 DIAGNOSIS — Z23 Encounter for immunization: Secondary | ICD-10-CM

## 2011-01-29 MED ORDER — TETANUS-DIPHTH-ACELL PERTUSSIS 5-2.5-18.5 LF-MCG/0.5 IM SUSP: 0.5000 mL | Freq: Once | INTRAMUSCULAR | Status: DC

## 2011-01-30 ENCOUNTER — Other Ambulatory Visit: Payer: Self-pay | Admitting: Family Medicine

## 2011-02-05 ENCOUNTER — Ambulatory Visit: Payer: BC Managed Care – PPO | Admitting: Family Medicine

## 2011-02-08 ENCOUNTER — Encounter: Payer: Self-pay | Admitting: Family Medicine

## 2011-02-08 NOTE — Progress Notes (Signed)
  Subjective:    Patient ID: Amber Brewer, female    DOB: 09-24-1951, 60 y.o.   MRN: [id number] This 60 year old white married pleasant female is in the 40 per minute health maintenance exam relates she's been doing her well her bronchospasm and asthma has been much improved on Qvar which she inhaled the morning and uses per where it needed She has chronic history of dry usees restenosis normal teardrop Her tension and panic attacks are controlled with diazepam as well as preventing muscle cramps Hypertension continues to be controlled with Avapro 300 side Patient had Pap smear 1-1/2 years ago, relates she has some pain in the right lower quadrant which is episodic and not persistent and is really infrequent HPI    Review of Systems See history of present illness    Objective:   Physical Exam The patient is a healthy white female who appears to be in no distress,slightly overweight HEENT revealed a beginning about herpes simplex on lower left carotid pulses are good thyroid nonpalpable Lungs clear to palpation percussion auscultation Breasts are smooth no masses no tenderness nipples normal axilla clear no lymphadenopathy Heart nocardiomegaly heart sounds are good without murmurs peripheral pulses good and equal bilaterally Abdomen liver spleen and kidneys are nonpalpable no masses no tenderness right lower quadrant -2 examination pelvic examination not done Extremities negative neurological no positive findings       Assessment & Plan:  Physical examination in its entirety essentially normal blood pressure control lungs are clear no other abnormalities found Hypertension controlled TSH was elevated for new diagnosis of hypothyroidism borderline and will start treatment with Synthroid 50 mg q.d.

## 2011-02-12 ENCOUNTER — Other Ambulatory Visit: Payer: Self-pay | Admitting: Family Medicine

## 2011-03-05 ENCOUNTER — Other Ambulatory Visit: Payer: BC Managed Care – PPO

## 2011-03-05 ENCOUNTER — Other Ambulatory Visit (INDEPENDENT_AMBULATORY_CARE_PROVIDER_SITE_OTHER): Payer: BC Managed Care – PPO

## 2011-03-05 DIAGNOSIS — E039 Hypothyroidism, unspecified: Secondary | ICD-10-CM

## 2011-03-05 LAB — TSH: TSH: 1.21 u[IU]/mL (ref 0.35–5.50)

## 2011-03-18 ENCOUNTER — Other Ambulatory Visit: Payer: Self-pay | Admitting: Family Medicine

## 2011-04-09 ENCOUNTER — Other Ambulatory Visit: Payer: Self-pay | Admitting: Family Medicine

## 2011-05-09 ENCOUNTER — Other Ambulatory Visit: Payer: Self-pay | Admitting: Dermatology

## 2011-06-03 ENCOUNTER — Other Ambulatory Visit: Payer: Self-pay | Admitting: Family Medicine

## 2011-08-09 ENCOUNTER — Other Ambulatory Visit: Payer: Self-pay | Admitting: Family Medicine

## 2011-08-14 NOTE — Telephone Encounter (Signed)
[facility] Thanks.

## 2011-08-15 ENCOUNTER — Other Ambulatory Visit: Payer: Self-pay | Admitting: Family Medicine

## 2011-09-03 ENCOUNTER — Telehealth: Payer: Self-pay | Admitting: Family Medicine

## 2011-09-03 MED ORDER — ACYCLOVIR 400 MG PO TABS: 400.0000 mg | ORAL_TABLET | Freq: Every day | ORAL | Status: DC

## 2011-09-03 NOTE — Telephone Encounter (Signed)
rx sent into pharmacy

## 2011-09-03 NOTE — Telephone Encounter (Signed)
Pt requesting a 90 da refill on acyclovir (ZOVIRAX) 400 MG tablet    [facility]

## 2011-11-26 ENCOUNTER — Other Ambulatory Visit: Payer: Self-pay | Admitting: Family Medicine

## 2011-12-03 NOTE — Telephone Encounter (Signed)
Pt has not established with new pcp. Pt last seen on 01/22/11. Pls advise.

## 2011-12-03 NOTE — Telephone Encounter (Signed)
Patient needs to establish with new primary care physician before further refills

## 2012-02-09 ENCOUNTER — Other Ambulatory Visit: Payer: Self-pay | Admitting: Family Medicine

## 2012-04-29 ENCOUNTER — Ambulatory Visit (INDEPENDENT_AMBULATORY_CARE_PROVIDER_SITE_OTHER): Payer: BC Managed Care – PPO | Admitting: Family Medicine

## 2012-04-29 VITALS — BP 148/78 | HR 71 | Temp 97.4°F | Resp 18 | Ht 65.0 in | Wt 163.0 lb

## 2012-04-29 DIAGNOSIS — N390 Urinary tract infection, site not specified: Secondary | ICD-10-CM

## 2012-04-29 DIAGNOSIS — N760 Acute vaginitis: Secondary | ICD-10-CM

## 2012-04-29 DIAGNOSIS — N898 Other specified noninflammatory disorders of vagina: Secondary | ICD-10-CM

## 2012-04-29 LAB — POCT UA - MICROSCOPIC ONLY
Bacteria, U Microscopic: NEGATIVE
Casts, Ur, LPF, POC: NEGATIVE
Crystals, Ur, HPF, POC: NEGATIVE
Mucus, UA: NEGATIVE

## 2012-04-29 LAB — POCT WET PREP WITH KOH
KOH Prep POC: NEGATIVE
RBC Wet Prep HPF POC: NEGATIVE
WBC Wet Prep HPF POC: NEGATIVE

## 2012-04-29 LAB — POCT URINALYSIS DIPSTICK
Leukocytes, UA: NEGATIVE
Protein, UA: NEGATIVE
Spec Grav, UA: <=1.005
Urobilinogen, UA: 0.2

## 2012-04-29 MED ORDER — NITROFURANTOIN MONOHYD MACRO 100 MG PO CAPS: 100.0000 mg | ORAL_CAPSULE | Freq: Two times a day (BID) | ORAL | Status: AC

## 2012-04-29 MED ORDER — METRONIDAZOLE 0.75 % VA GEL: VAGINAL | Status: AC

## 2012-04-29 NOTE — Progress Notes (Signed)
Date:  04/29/2012   Name:  ULAH OLMO   DOB:  26-Apr-1951   MRN:  [id number]  PCP:  Gaye Alken, MD    Chief Complaint: Urinary Tract Infection   History of Present Illness:  BRYNNE DOANE is a 61 y.o. very pleasant female patient who presents with the following:  She has noted urinary discomfort, some cramping with urination, and frequency.  No hematuria.  She has had UTIs in the past and this is typical of her symptoms.  She has been treating herself at home with increased fluids and cranberry juice. She used an OTC UTI test kit which was "very positive." It tests for nitrites.  She has noted some left sided back pain today- otherwise no fever, vomiting, etc.    Jakera has also noted some vaginal itching, but is not sure if this is a separate problem.  She actually had a hysterectomy at age 80- this was recommended to her when she wanted to have a BTL after she had completed her family.  She states that her bladder and ovaries were damaged during this operation.   Patient Active Problem List  Diagnosis  . HYPOTHYROIDISM, BORDERLINE  . HYPOKALEMIA  . PANIC ATTACK  . ANXIETY, SITUATIONAL  . DRY EYE SYNDROME  . DISORDER, LABYRINTH NEC  . HYPERTENSION  . UNSPECIFIED PAROXYSMAL TACHYCARDIA  . OSTEOARTHRITIS  . LEG PAIN, BILATERAL  . LEG EDEMA, BILATERAL  . SHORTNESS OF BREATH  . WHEEZING  . CHEST PAIN, PRECORDIAL  . NEPHROLITHIASIS, HX OF  . ARTHRITIS, HX OF  . ALLERGY, HX OF   Past Medical History  Diagnosis Date  . Hx of melanoma in situ 2002  . Hypertension   . Kidney calculi   . Nephrolithiasis   . Osteoarthritis    Past Surgical History  Procedure Date  . Appendectomy   . Cholecystectomy   . Abdominal hysterectomy   . Removal of melanoma in situ    History  Substance Use Topics  . Smoking status: Never Smoker   . Smokeless tobacco: Never Used  . Alcohol Use: No   Family History  Problem Relation Age of Onset  . Hypertension Mother     . Heart attack Father   . Heart disease Father   . Thyroid disease Sister   . Hypertension Maternal Aunt   . Hypertension Maternal Uncle   . Clotting disorder Maternal Uncle   . Hypertension Maternal Grandmother   . Hyperlipidemia Maternal Grandfather   . Hyperlipidemia Paternal Grandmother   . Cancer Paternal Grandmother     breast  . Hyperlipidemia Paternal Grandfather   . Hypertension Sister    Allergies  Allergen Reactions  . Codeine Phosphate     REACTION: unspecified  . Iodine     REACTION: unspecified    Medication list has been reviewed and updated.  Current Outpatient Prescriptions on File Prior to Visit  Medication Sig Dispense Refill  . acyclovir (ZOVIRAX) 400 MG tablet TAKE 1 TABLET BY MOUTH EVERY DAY  30 tablet  0  . acyclovir (ZOVIRAX) 400 MG tablet Take 1 tablet (400 mg total) by mouth daily.  90 tablet  0  . Biotin 10 MG TABS Take by mouth.        . BYSTOLIC 5 MG tablet TAKE 1 TABLET BY MOUTH EVERY DAY  30 tablet  11  . cycloSPORINE (RESTASIS) 0.05 % ophthalmic emulsion 1 drop as needed.        . diazepam (VALIUM) 5 MG  tablet TAKE 1 TABLET BY MOUTH EVERY MORNING , MIDAFTERNOON AND AT BEDTIME FOR STRESS. DO NOT TAKE WITH LORAZEPAM  90 tablet  5  . estradiol (VIVELLE-DOT) 0.1 MG/24HR Place 1 patch onto the skin 2 (two) times a week.        . Flaxseed, Linseed, (FLAX SEED OIL) 1000 MG CAPS Take by mouth.        . hydrochlorothiazide 25 MG tablet TAKE 1 TABLET BY MOUTH ONCE DAILY FOR BLOOD PRESSURE  30 tablet  11  . irbesartan (AVAPRO) 300 MG tablet Take 300 mg by mouth at bedtime.        Marland Kitchen levothyroxine (SYNTHROID) 50 MCG tablet Take 1 tablet (50 mcg total) by mouth daily.  30 tablet  11  . mometasone (NASONEX) 50 MCG/ACT nasal spray 2 sprays by Nasal route daily.        . Multiple Vitamin (MULTIVITAMIN) capsule Take 1 capsule by mouth daily.        . Omega-3 Fatty Acids (FISH OIL) 1000 MG CAPS Take by mouth.        . potassium chloride (KAON-CL-10) 10 MEQ CR  tablet Take 10 mEq by mouth daily.        Marland Kitchen QVAR 80 MCG/ACT inhaler INHALE 2 PUFFS TWICE DAILY TO PREVENT COUGH OR WHEEZE. RINSE, GARGLE & SPIT AFTER USE  7.3 g  11  . calcium carbonate (OS-CAL - DOSED IN MG OF ELEMENTAL CALCIUM) 1250 MG tablet Take 1 tablet by mouth daily.         Current Facility-Administered Medications on File Prior to Visit  Medication Dose Route Frequency Provider Last Rate Last Dose  . TDaP (BOOSTRIX) injection 0.5 mL  0.5 mL Intramuscular Once Damian Leavell., MD        Review of Systems:  As per HPI- otherwise negative.   Physical Examination: Filed Vitals:   04/29/12 0955  BP: 148/78  Pulse: 71  Temp: 97.4 F (36.3 C)  Resp: 18   Filed Vitals:   04/29/12 0955  Height: 5\' 5"  (1.651 m)  Weight: 163 lb (73.936 kg)   Body mass index is 27.12 kg/(m^2). Ideal Body Weight: Weight in (lb) to have BMI = 25: 149.9   GEN: WDWN, NAD, Non-toxic, A & O x 3 HEENT: Atraumatic, Normocephalic. Neck supple. No masses, No LAD. Ears and Nose: No external deformity. CV: RRR, No M/G/R. No JVD. No thrill. No extra heart sounds. PULM: CTA B, no wheezes, crackles, rhonchi. No retractions. No resp. distress. No accessory muscle use. ABD: S, NT, ND, +BS. No rebound. No HSM.  No CVA tenderness EXTR: No c/c/e NEURO Normal gait.  PSYCH: Normally interactive. Conversant. Not depressed or anxious appearing.  Calm demeanor.  GU: normal external genitals and normal vaginal vault.    Results for orders placed in visit on 04/29/12  POCT UA - MICROSCOPIC ONLY      Component Value Range   WBC, Ur, HPF, POC neg     RBC, urine, microscopic neg     Bacteria, U Microscopic neg     Mucus, UA neg     Epithelial cells, urine per micros 1-2     Crystals, Ur, HPF, POC neg     Casts, Ur, LPF, POC neg     Yeast, UA neg    POCT WET PREP WITH KOH      Component Value Range   Trichomonas, UA Negative     Clue Cells Wet Prep HPF POC 3-5  Epithelial Wet Prep HPF POC 3-8       Yeast Wet Prep HPF POC neg     Bacteria Wet Prep HPF POC 1+     RBC Wet Prep HPF POC neg     WBC Wet Prep HPF POC neg     KOH Prep POC Negative    POCT URINALYSIS DIPSTICK      Component Value Range   Color, UA pale yellow     Clarity, UA clear     Glucose, UA neg     Bilirubin, UA neg     Ketones, UA neg     Spec Grav, UA <=1.005     Blood, UA neg     pH, UA 5.5     Protein, UA neg     Urobilinogen, UA 0.2     Nitrite, UA neg     Leukocytes, UA Negative      Assessment and Plan: 1. UTI (urinary tract infection)  Urine culture, POCT Urinalysis, Dipstick, POCT UA - Microscopic Only, Urine culture, POCT Urinalysis, Dipstick, POCT urinalysis dipstick, Urine culture, nitrofurantoin, macrocrystal-monohydrate, (MACROBID) 100 MG capsule  2. Vaginal itching  POCT Wet Prep with KOH, metroNIDAZOLE (METROGEL VAGINAL) 0.75 % vaginal gel   Rika's symptoms are consistent with a UTI- her clean urine may be due to very good hydration.  Will cover with macrobid while we await her urine culture.  However, will also treat with metrogel vaginal due to evidence of BV.  Patient (or parent if minor) instructed to return to clinic or call if not better in 2 day(s).  Will plan further follow- up pending labs.    Abbe Amsterdam, MD

## 2012-05-01 LAB — URINE CULTURE: Colony Count: 4000

## 2012-05-11 ENCOUNTER — Other Ambulatory Visit: Payer: Self-pay | Admitting: Family Medicine

## 2013-04-09 ENCOUNTER — Encounter (INDEPENDENT_AMBULATORY_CARE_PROVIDER_SITE_OTHER): Payer: BC Managed Care – PPO | Admitting: Ophthalmology

## 2013-09-14 ENCOUNTER — Other Ambulatory Visit: Payer: Self-pay | Admitting: Dermatology

## 2013-10-21 HISTORY — PX: CATARACT EXTRACTION: SUR2

## 2014-08-19 ENCOUNTER — Other Ambulatory Visit: Payer: Self-pay | Admitting: Dermatology

## 2015-09-05 LAB — GLUCOSE, POCT (MANUAL RESULT ENTRY): POC Glucose: 112 mg/dl — AB (ref 70–99)

## 2015-12-14 ENCOUNTER — Ambulatory Visit
Admission: RE | Admit: 2015-12-14 | Discharge: 2015-12-14 | Disposition: A | Discharge status: Home / Self Care | Payer: BLUE CROSS/BLUE SHIELD | Source: Ambulatory Visit | Attending: Dermatology | Admitting: Dermatology

## 2015-12-14 ENCOUNTER — Other Ambulatory Visit: Payer: Self-pay | Admitting: Dermatology

## 2015-12-14 DIAGNOSIS — R05 Cough: Secondary | ICD-10-CM

## 2015-12-14 DIAGNOSIS — R059 Cough, unspecified: Secondary | ICD-10-CM

## 2015-12-14 DIAGNOSIS — R0602 Shortness of breath: Secondary | ICD-10-CM

## 2016-01-01 DIAGNOSIS — R55 Syncope and collapse: Secondary | ICD-10-CM | POA: Diagnosis not present

## 2016-01-01 DIAGNOSIS — J069 Acute upper respiratory infection, unspecified: Secondary | ICD-10-CM | POA: Diagnosis not present

## 2016-01-01 DIAGNOSIS — K224 Dyskinesia of esophagus: Secondary | ICD-10-CM | POA: Diagnosis not present

## 2016-01-01 DIAGNOSIS — J45909 Unspecified asthma, uncomplicated: Secondary | ICD-10-CM | POA: Diagnosis not present

## 2016-01-05 ENCOUNTER — Encounter: Payer: Self-pay | Admitting: Internal Medicine

## 2016-01-09 ENCOUNTER — Telehealth: Payer: Self-pay | Admitting: Internal Medicine

## 2016-01-09 NOTE — Telephone Encounter (Signed)
Received records from [facility] for appointment with Dr Debara Pickett on 01/18/16.  Records given to [facility] (medical records) for Dr Lysbeth Penner schedule on 01/18/16.

## 2016-01-18 ENCOUNTER — Encounter: Payer: Self-pay | Admitting: Internal Medicine

## 2016-01-18 ENCOUNTER — Telehealth: Payer: Self-pay | Admitting: Internal Medicine

## 2016-01-18 ENCOUNTER — Ambulatory Visit (INDEPENDENT_AMBULATORY_CARE_PROVIDER_SITE_OTHER): Payer: PPO | Admitting: Internal Medicine

## 2016-01-18 VITALS — BP 158/78 | HR 64 | Ht 63.0 in | Wt 177.2 lb

## 2016-01-18 DIAGNOSIS — R072 Precordial pain: Secondary | ICD-10-CM

## 2016-01-18 DIAGNOSIS — I10 Essential (primary) hypertension: Secondary | ICD-10-CM

## 2016-01-18 DIAGNOSIS — R0602 Shortness of breath: Secondary | ICD-10-CM

## 2016-01-18 DIAGNOSIS — R079 Chest pain, unspecified: Secondary | ICD-10-CM | POA: Diagnosis not present

## 2016-01-18 NOTE — Patient Instructions (Addendum)
Your physician has requested that you have an exercise stress myoview. For further information please visit [email or web address]. Please follow instruction sheet, as given.    Your physician recommends that you schedule a follow-up appointment after your stress test.   

## 2016-01-18 NOTE — Progress Notes (Signed)
OFFICE NOTE  Chief Complaint:  Chest pain, DOE  Primary Care Physician: Gerrit Heck, MD  HPI:  Amber Brewer is a 65 y.o. female with a past medical history significant for hypertension, dyslipidemia, asthma/allergies, GERD, Schatzki's ring and prior esophageal dilatation about 5 years ago.  She now presents with recent episodes of squeezing in the chest which feel episodic and spasmodic. She is concerned that this may be esophageal spasm however her primary care provider wanted to rule out possible coronary disease. She feels a tightness in her chest that is not necessarily associated with eating or present with exertion or relieved by rest. She is also short of breath, particularly when walking up hills. The tightness is been coming on more recently. She's also been under significant stress and does have some history of anxiety. She is caring for disabled son and has an 34 year old mother who she is caring for. 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 She reports that she does have some mild trouble swallowing and occasionally food does get stuck in her mid throat and often times pills are difficult to swallow and may come up. She also reported a brief presyncopal episode however this is with significantly rotating her head about 90 the cause that quick decrease in vision which improved when straightening her head out. She does have a history of neck injury in the past.  PMHx:  Past Medical History  Diagnosis Date  . Hx of melanoma in situ 2002  . Hypertension   . Kidney calculi   . Nephrolithiasis   . Osteoarthritis     Past Surgical History  Procedure Laterality Date  . Appendectomy    . Cholecystectomy    . Abdominal hysterectomy    . Removal of melanoma in situ      FAMHx:  Family History  Problem Relation Age of Onset  . Hypertension Mother   . Heart attack Father   . Heart disease Father   . Thyroid disease Sister   .  Hypertension Maternal Aunt   . Hypertension Maternal Uncle   . Clotting disorder Maternal Uncle   . Hypertension Maternal Grandmother   . Hyperlipidemia Maternal Grandfather   . Hyperlipidemia Paternal Grandmother   . Breast cancer Paternal Grandmother   . Hyperlipidemia Paternal Grandfather   . Hypertension Sister     SOCHx:   reports that she has never smoked. She has never used smokeless tobacco. She reports that she does not drink alcohol or use illicit drugs.  ALLERGIES:  Allergies  Allergen Reactions  . Codeine Phosphate     REACTION: unspecified  . Iodine     Broke out on skin once years ago  . Prednisone     ROS: Pertinent items noted in HPI and remainder of comprehensive ROS otherwise negative.  HOME MEDS: Current Outpatient Prescriptions  Medication Sig Dispense Refill  . acyclovir (ZOVIRAX) 400 MG tablet TAKE 1 TABLET BY MOUTH EVERY DAY 30 tablet 0  . Biotin 10 MG TABS Take by mouth.      . BYSTOLIC 5 MG tablet TAKE 1 TABLET BY MOUTH EVERY DAY 30 tablet 11  . cholecalciferol (VITAMIN D) 1000 UNITS tablet Take 1,000 Units by mouth daily.    . cycloSPORINE (RESTASIS) 0.05 % ophthalmic emulsion 1 drop as needed.      . diazepam (VALIUM) 5 MG tablet TAKE 1 TABLET BY MOUTH EVERY MORNING , MIDAFTERNOON AND AT BEDTIME FOR STRESS. DO NOT TAKE WITH  LORAZEPAM 90 tablet 5  . estradiol (VIVELLE-DOT) 0.1 MG/24HR Place 1 patch onto the skin 2 (two) times a week.      . Flaxseed, Linseed, (FLAX SEED OIL) 1000 MG CAPS Take by mouth.      . hydrochlorothiazide 25 MG tablet TAKE 1 TABLET BY MOUTH ONCE DAILY FOR BLOOD PRESSURE 30 tablet 11  . irbesartan (AVAPRO) 300 MG tablet Take 300 mg by mouth at bedtime.      . mometasone (NASONEX) 50 MCG/ACT nasal spray 2 sprays by Nasal route daily.      . Multiple Vitamin (MULTIVITAMIN) capsule Take 1 capsule by mouth daily.      . Omega-3 Fatty Acids (FISH OIL) 1000 MG CAPS Take by mouth.      Marland Kitchen omeprazole (PRILOSEC) 20 MG capsule Take 1  capsule by mouth daily.    . potassium chloride (KAON-CL-10) 10 MEQ CR tablet Take 10 mEq by mouth daily.      Marland Kitchen PROAIR HFA 108 (90 Base) MCG/ACT inhaler as needed.    Marland Kitchen QVAR 80 MCG/ACT inhaler INHALE 2 PUFFS TWICE DAILY TO PREVENT COUGH OR WHEEZE. RINSE, GARGLE & SPIT AFTER USE 7.3 g 11  . levothyroxine (SYNTHROID) 50 MCG tablet Take 1 tablet (50 mcg total) by mouth daily. 30 tablet 11   Current Facility-Administered Medications  Medication Dose Route Frequency Provider Last Rate Last Dose  . TDaP (BOOSTRIX) injection 0.5 mL  0.5 mL Intramuscular Once Hoover Browns., MD        LABS/IMAGING: No results found for this or any previous visit (from the past 48 hour(s)). No results found.  WEIGHTS: Wt Readings from Last 3 Encounters:  01/18/16 177 lb 3.2 oz (80.377 kg)  04/29/12 163 lb (73.936 kg)  01/22/11 172 lb (78.019 kg)    VITALS: BP 158/78 mmHg  Pulse 64  Ht 5\' 3"  (1.6 m)  Wt 177 lb 3.2 oz (80.377 kg)  BMI 31.40 kg/m2  EXAM: General appearance: alert and no distress Neck: no carotid bruit and no JVD Lungs: clear to auscultation bilaterally Heart: regular rate and rhythm, S1, S2 normal, no murmur, click, rub or gallop Abdomen: soft, non-tender; bowel sounds normal; no masses,  no organomegaly Extremities: extremities normal, atraumatic, no cyanosis or edema Pulses: 2+ and symmetric Skin: Skin color, texture, turgor normal. No rashes or lesions Neurologic: Grossly normal Psych: Pleasant  EKG: Normal sinus rhythm at 64, nonspecific ST changes  ASSESSMENT: 1. Chest pain, more likely esophageal spasm however cannot rule out angina 2. Progressive dyspnea on exertion 3. Hypertension 4. Dyslipidemia  PLAN: 1.  Will obtain an exercise Myoview to evaluate for ischemia. At this is low risk I would defer back to her gastroenterologist Dr. Henrene Pastor for possible repeat EGD and evaluation of stricture/ring and possible esophageal spasm. Blood pressure appears fairly  well controlled. Cholesterol may need treatment as it's elevated. Will contemplate that in the context of her stress test findings.  Thanks for the kind referral.  Pixie Casino, MD, [facility] Attending Cardiologist [place] 01/18/2016, 11:26 AM

## 2016-01-18 NOTE — Telephone Encounter (Signed)
Faxed Release signed by patient to [facility] to obtain records per Dr Debara Pickett.  Relase faxed on 01/18/16. lp

## 2016-01-19 ENCOUNTER — Telehealth: Payer: Self-pay | Admitting: Internal Medicine

## 2016-01-19 ENCOUNTER — Encounter: Payer: Self-pay | Admitting: *Deleted

## 2016-01-19 NOTE — Telephone Encounter (Signed)
Received records from [facility] per Dr [facility] request.  Records given to Dr Debara Pickett to review.  Patient also has appointment on 02/22/16. lp

## 2016-02-06 ENCOUNTER — Encounter: Payer: Self-pay | Admitting: Internal Medicine

## 2016-02-06 ENCOUNTER — Telehealth (HOSPITAL_COMMUNITY): Payer: Self-pay

## 2016-02-06 ENCOUNTER — Telehealth: Payer: Self-pay | Admitting: Internal Medicine

## 2016-02-06 NOTE — Telephone Encounter (Signed)
Encounter complete. 

## 2016-02-07 NOTE — Telephone Encounter (Signed)
Close encounter 

## 2016-02-08 ENCOUNTER — Ambulatory Visit (HOSPITAL_COMMUNITY)
Admission: RE | Admit: 2016-02-08 | Discharge: 2016-02-08 | Disposition: A | Discharge status: Home / Self Care | Payer: PPO | Source: Ambulatory Visit | Attending: Cardiology | Admitting: Cardiology

## 2016-02-08 DIAGNOSIS — R5383 Other fatigue: Secondary | ICD-10-CM | POA: Diagnosis not present

## 2016-02-08 DIAGNOSIS — R0609 Other forms of dyspnea: Secondary | ICD-10-CM | POA: Diagnosis not present

## 2016-02-08 DIAGNOSIS — R002 Palpitations: Secondary | ICD-10-CM | POA: Diagnosis not present

## 2016-02-08 DIAGNOSIS — Z6831 Body mass index (BMI) 31.0-31.9, adult: Secondary | ICD-10-CM | POA: Insufficient documentation

## 2016-02-08 DIAGNOSIS — R0602 Shortness of breath: Secondary | ICD-10-CM | POA: Diagnosis not present

## 2016-02-08 DIAGNOSIS — I10 Essential (primary) hypertension: Secondary | ICD-10-CM

## 2016-02-08 DIAGNOSIS — E663 Overweight: Secondary | ICD-10-CM | POA: Insufficient documentation

## 2016-02-08 DIAGNOSIS — R079 Chest pain, unspecified: Secondary | ICD-10-CM | POA: Diagnosis not present

## 2016-02-08 DIAGNOSIS — Z8249 Family history of ischemic heart disease and other diseases of the circulatory system: Secondary | ICD-10-CM | POA: Diagnosis not present

## 2016-02-08 LAB — MYOCARDIAL PERFUSION IMAGING
CHL CUP MPHR: 155 {beats}/min
CHL CUP STRESS STAGE 1 DBP: 79 mmHg
CHL CUP STRESS STAGE 1 GRADE: 0 %
CHL CUP STRESS STAGE 1 HR: 72 {beats}/min
CHL CUP STRESS STAGE 1 SBP: 131 mmHg
CHL CUP STRESS STAGE 1 SPEED: 0 mph
CHL CUP STRESS STAGE 2 GRADE: 0 %
CHL CUP STRESS STAGE 2 HR: 68 {beats}/min
CHL CUP STRESS STAGE 4 HR: 101 {beats}/min
CHL CUP STRESS STAGE 4 SBP: 165 mmHg
CHL CUP STRESS STAGE 4 SPEED: 1.7 mph
CHL CUP STRESS STAGE 5 DBP: 91 mmHg
CHL CUP STRESS STAGE 5 GRADE: 12 %
CHL CUP STRESS STAGE 5 HR: 133 {beats}/min
CHL CUP STRESS STAGE 6 HR: 141 {beats}/min
CHL CUP STRESS STAGE 6 SPEED: 3.4 mph
CHL CUP STRESS STAGE 7 DBP: 97 mmHg
CHL CUP STRESS STAGE 7 GRADE: 0 %
CHL CUP STRESS STAGE 7 HR: 112 {beats}/min
CHL CUP STRESS STAGE 7 SBP: 198 mmHg
CHL CUP STRESS STAGE 7 SPEED: 1.5 mph
CHL CUP STRESS STAGE 8 DBP: 72 mmHg
CHL CUP STRESS STAGE 8 SBP: 143 mmHg
CSEPHR: 90 %
CSEPPMHR: 90 %
Estimated workload: 7.8 METS
Exercise duration (min): 6 min
Exercise duration (sec): 31 s
Peak HR: 141 {beats}/min
RPE: 17
Rest HR: 65 {beats}/min
Stage 2 Speed: 1 mph
Stage 3 Grade: 0.1 %
Stage 3 HR: 68 {beats}/min
Stage 3 Speed: 1 mph
Stage 4 DBP: 79 mmHg
Stage 4 Grade: 10 %
Stage 5 SBP: 186 mmHg
Stage 5 Speed: 2.5 mph
Stage 6 Grade: 14 %
Stage 8 Grade: 0 %
Stage 8 HR: 87 {beats}/min
Stage 8 Speed: 0 mph

## 2016-02-08 MED ORDER — TECHNETIUM TC 99M SESTAMIBI GENERIC - CARDIOLITE
10.0000 | Freq: Once | INTRAVENOUS | Status: AC | PRN
  Administered 2016-02-08: 10 via INTRAVENOUS

## 2016-02-08 MED ORDER — TECHNETIUM TC 99M SESTAMIBI GENERIC - CARDIOLITE
31.1000 | Freq: Once | INTRAVENOUS | Status: AC | PRN
  Administered 2016-02-08: 31.1 via INTRAVENOUS

## 2016-02-14 DIAGNOSIS — H40033 Anatomical narrow angle, bilateral: Secondary | ICD-10-CM | POA: Diagnosis not present

## 2016-02-14 DIAGNOSIS — H04123 Dry eye syndrome of bilateral lacrimal glands: Secondary | ICD-10-CM | POA: Diagnosis not present

## 2016-02-22 ENCOUNTER — Ambulatory Visit: Payer: PPO | Admitting: Internal Medicine

## 2016-02-26 ENCOUNTER — Encounter: Payer: Self-pay | Admitting: Internal Medicine

## 2016-02-26 ENCOUNTER — Ambulatory Visit (INDEPENDENT_AMBULATORY_CARE_PROVIDER_SITE_OTHER): Payer: PPO | Admitting: Internal Medicine

## 2016-02-26 VITALS — BP 140/86 | HR 66 | Ht 64.0 in | Wt 179.4 lb

## 2016-02-26 DIAGNOSIS — I10 Essential (primary) hypertension: Secondary | ICD-10-CM | POA: Diagnosis not present

## 2016-02-26 DIAGNOSIS — R072 Precordial pain: Secondary | ICD-10-CM

## 2016-02-26 MED ORDER — NEBIVOLOL HCL 5 MG PO TABS: 7.5000 mg | ORAL_TABLET | Freq: Every day | ORAL | Status: DC

## 2016-02-26 NOTE — Patient Instructions (Signed)
Your physician has recommended you make the following change in your medication: INCREASE bystolic to 7.5mg  once daily  Your physician recommends that you schedule a follow-up appointment in [place] with Dr. Debara Pickett

## 2016-02-26 NOTE — Progress Notes (Signed)
OFFICE NOTE  Chief Complaint:  Follow-up stress test  Primary Care Physician: Gerrit Heck, MD  HPI:  Amber Brewer is a 65 y.o. female with a past medical history significant for hypertension, dyslipidemia, asthma/allergies, GERD, Schatzki's ring and prior esophageal dilatation about 5 years ago.  She now presents with recent episodes of squeezing in the chest which feel episodic and spasmodic. She is concerned that this may be esophageal spasm however her primary care provider wanted to rule out possible coronary disease. She feels a tightness in her chest that is not necessarily associated with eating or present with exertion or relieved by rest. She is also short of breath, particularly when walking up hills. The tightness is been coming on more recently. She's also been under significant stress and does have some history of anxiety. She is caring for disabled son and has an 24 year old mother who she is caring for. 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 She reports that she does have some mild trouble swallowing and occasionally food does get stuck in her mid throat and often times pills are difficult to swallow and may come up. She also reported a brief presyncopal episode however this is with significantly rotating her head about 90 the cause that quick decrease in vision which improved when straightening her head out. She does have a history of neck injury in the past.  02/26/2016  [name] returns today for follow-up of her nuclear stress test. This is an exercise study in which no ischemia was noted. LVEF was greater than 65%. She did have a hypertensive response to exercise with a peak systolic blood pressure 123456. Blood pressure appears that is not as well-controlled as it should be. We discussed her chest discomfort which she says she is had some improvement in. Given her prior esophageal dilatation history and Schatzki's ring, she may be  having esophageal spasm. I asked her to follow-up with her gastroenterologist about this. She may need manometry. If this is the case then she may benefit from addition of low-dose calcium channel blocker. She does not want to add any extra medication at this time, however blood pressure is suboptimally controlled.  PMHx:  Past Medical History  Diagnosis Date  . Hx of melanoma in situ 2002  . Hypertension   . Kidney calculi   . Nephrolithiasis   . Osteoarthritis   . Hyperlipidemia   . Hypothyroidism     Past Surgical History  Procedure Laterality Date  . Appendectomy    . Cholecystectomy    . Abdominal hysterectomy  1974  . Removal of melanoma in situ    . Breast surgery  2011  . Bladder surgery  1974  . Cataract extraction  2015    FAMHx:  Family History  Problem Relation Age of Onset  . Hypertension Mother     thyroid issues  . Heart attack Father   . Heart disease Father   . Thyroid disease Sister   . Hypertension Maternal Aunt   . Hypertension Maternal Uncle   . Clotting disorder Maternal Uncle   . Hypertension Maternal Grandmother   . Hyperlipidemia Maternal Grandfather   . Hyperlipidemia Paternal Grandmother     also MI  . Breast cancer Paternal Grandmother   . Hyperlipidemia Paternal Grandfather     also MI  . Hypertension Sister   . Hypertension Son   . Endometriosis Daughter     SOCHx:   reports that she has  never smoked. She has never used smokeless tobacco. She reports that she does not drink alcohol or use illicit drugs.  ALLERGIES:  Allergies  Allergen Reactions  . Codeine Phosphate     REACTION: unspecified  . Iodine     Broke out on skin once years ago  . Prednisone     ROS: Pertinent items noted in HPI and remainder of comprehensive ROS otherwise negative.  HOME MEDS: Current Outpatient Prescriptions  Medication Sig Dispense Refill  . acyclovir (ZOVIRAX) 400 MG tablet TAKE 1 TABLET BY MOUTH EVERY DAY 30 tablet 0  . Biotin 10 MG TABS  Take by mouth.      . cholecalciferol (VITAMIN D) 1000 UNITS tablet Take 1,000 Units by mouth daily.    . cycloSPORINE (RESTASIS) 0.05 % ophthalmic emulsion 1 drop as needed.      . diazepam (VALIUM) 5 MG tablet TAKE 1 TABLET BY MOUTH EVERY MORNING , MIDAFTERNOON AND AT BEDTIME FOR STRESS. DO NOT TAKE WITH LORAZEPAM 90 tablet 5  . estradiol (VIVELLE-DOT) 0.1 MG/24HR Place 1 patch onto the skin 2 (two) times a week.      . Flaxseed, Linseed, (FLAX SEED OIL) 1000 MG CAPS Take by mouth.      . fluticasone (FLONASE) 50 MCG/ACT nasal spray Place 1 spray into both nostrils as needed.    . hydrochlorothiazide 25 MG tablet TAKE 1 TABLET BY MOUTH ONCE DAILY FOR BLOOD PRESSURE 30 tablet 11  . irbesartan (AVAPRO) 300 MG tablet Take 300 mg by mouth at bedtime.      . mometasone (NASONEX) 50 MCG/ACT nasal spray 2 sprays by Nasal route daily.      . Multiple Vitamin (MULTIVITAMIN) capsule Take 1 capsule by mouth daily.      . nebivolol (BYSTOLIC) 5 MG tablet Take 1.5 tablets (7.5 mg total) by mouth daily. 45 tablet 5  . Omega-3 Fatty Acids (FISH OIL) 1000 MG CAPS Take by mouth.      Marland Kitchen omeprazole (PRILOSEC) 20 MG capsule Take 1 capsule by mouth daily.    . potassium chloride (KAON-CL-10) 10 MEQ CR tablet Take 10 mEq by mouth daily.      Marland Kitchen PROAIR HFA 108 (90 Base) MCG/ACT inhaler as needed.    Marland Kitchen QVAR 80 MCG/ACT inhaler INHALE 2 PUFFS TWICE DAILY TO PREVENT COUGH OR WHEEZE. RINSE, GARGLE & SPIT AFTER USE 7.3 g 11  . levothyroxine (SYNTHROID) 50 MCG tablet Take 1 tablet (50 mcg total) by mouth daily. 30 tablet 11   Current Facility-Administered Medications  Medication Dose Route Frequency Provider Last Rate Last Dose  . TDaP (BOOSTRIX) injection 0.5 mL  0.5 mL Intramuscular Once Hoover Browns., MD        LABS/IMAGING: No results found for this or any previous visit (from the past 48 hour(s)). No results found.  WEIGHTS: Wt Readings from Last 3 Encounters:  02/26/16 179 lb 6.4 oz (81.375 kg)    02/08/16 177 lb (80.287 kg)  01/18/16 177 lb 3.2 oz (80.377 kg)    VITALS: BP 140/86 mmHg  Pulse 66  Ht 5\' 4"  (1.626 m)  Wt 179 lb 6.4 oz (81.375 kg)  BMI 30.78 kg/m2  EXAM: Deferred  EKG: Deferred  ASSESSMENT: 1. Chest pain, more likely esophageal spasm - low risk exercise nuclear stress test (2017) 2. Progressive dyspnea on exertion - improved 3. Hypertension - not at goal 4. Dyslipidemia  PLAN: 1.  Mrs. Mezquita had a low risk exercise nuclear stress test. I've encouraged her  to work on increasing exercise and weight loss. Blood pressure could be better controlled. We'll make a small increase in her Bystolic to 7.5 mg daily. She should follow-up with her gastroenterologist about possible esophageal spasm. If in fact she does have this, she may benefit from low-dose calcium channel blocker.  Follow-up with me in 3 months to review blood pressure control.  Pixie Casino, MD, [facility] Attending Cardiologist [place] Celeste Tavenner 02/26/2016, 8:41 AM

## 2016-03-01 ENCOUNTER — Ambulatory Visit: Payer: PPO | Admitting: Internal Medicine

## 2016-03-25 DIAGNOSIS — Z961 Presence of intraocular lens: Secondary | ICD-10-CM | POA: Diagnosis not present

## 2016-03-25 DIAGNOSIS — I10 Essential (primary) hypertension: Secondary | ICD-10-CM | POA: Diagnosis not present

## 2016-03-25 DIAGNOSIS — H26492 Other secondary cataract, left eye: Secondary | ICD-10-CM | POA: Diagnosis not present

## 2016-03-25 DIAGNOSIS — H16223 Keratoconjunctivitis sicca, not specified as Sjogren's, bilateral: Secondary | ICD-10-CM | POA: Diagnosis not present

## 2016-03-26 ENCOUNTER — Telehealth: Payer: Self-pay | Admitting: Internal Medicine

## 2016-03-26 ENCOUNTER — Encounter: Payer: Self-pay | Admitting: Internal Medicine

## 2016-03-29 NOTE — Telephone Encounter (Signed)
Close encounter 

## 2016-04-01 DIAGNOSIS — H43393 Other vitreous opacities, bilateral: Secondary | ICD-10-CM | POA: Diagnosis not present

## 2016-04-03 ENCOUNTER — Other Ambulatory Visit: Payer: Self-pay | Admitting: Family Medicine

## 2016-04-03 DIAGNOSIS — N63 Unspecified lump in unspecified breast: Secondary | ICD-10-CM

## 2016-04-11 ENCOUNTER — Ambulatory Visit
Admission: RE | Admit: 2016-04-11 | Discharge: 2016-04-11 | Disposition: A | Discharge status: Home / Self Care | Payer: PPO | Source: Ambulatory Visit | Attending: Family Medicine | Admitting: Family Medicine

## 2016-04-11 DIAGNOSIS — N63 Unspecified lump in unspecified breast: Secondary | ICD-10-CM

## 2016-04-24 ENCOUNTER — Telehealth: Payer: Self-pay | Admitting: Internal Medicine

## 2016-04-24 NOTE — Telephone Encounter (Signed)
Spoke with pt and she states the pain on her right side under the ribs is better but still has some pressure there. Pt has had gallbladder removed. Pt was placed on a PPI by her PCP and states it has helped some. Pt will keep her scheduled appt with Dr. Henrene Pastor.

## 2016-05-24 DIAGNOSIS — L814 Other melanin hyperpigmentation: Secondary | ICD-10-CM | POA: Diagnosis not present

## 2016-05-24 DIAGNOSIS — Z85828 Personal history of other malignant neoplasm of skin: Secondary | ICD-10-CM | POA: Diagnosis not present

## 2016-05-24 DIAGNOSIS — Z87898 Personal history of other specified conditions: Secondary | ICD-10-CM | POA: Diagnosis not present

## 2016-05-24 DIAGNOSIS — D2271 Melanocytic nevi of right lower limb, including hip: Secondary | ICD-10-CM | POA: Diagnosis not present

## 2016-05-24 DIAGNOSIS — L918 Other hypertrophic disorders of the skin: Secondary | ICD-10-CM | POA: Diagnosis not present

## 2016-05-24 DIAGNOSIS — Z808 Family history of malignant neoplasm of other organs or systems: Secondary | ICD-10-CM | POA: Diagnosis not present

## 2016-05-24 DIAGNOSIS — D485 Neoplasm of uncertain behavior of skin: Secondary | ICD-10-CM | POA: Diagnosis not present

## 2016-05-24 DIAGNOSIS — D1800 Hemangioma unspecified site: Secondary | ICD-10-CM | POA: Diagnosis not present

## 2016-05-24 DIAGNOSIS — D1801 Hemangioma of skin and subcutaneous tissue: Secondary | ICD-10-CM | POA: Diagnosis not present

## 2016-05-24 DIAGNOSIS — Z86018 Personal history of other benign neoplasm: Secondary | ICD-10-CM | POA: Diagnosis not present

## 2016-05-27 ENCOUNTER — Ambulatory Visit (INDEPENDENT_AMBULATORY_CARE_PROVIDER_SITE_OTHER): Payer: PPO | Admitting: Internal Medicine

## 2016-05-27 ENCOUNTER — Encounter: Payer: Self-pay | Admitting: Internal Medicine

## 2016-05-27 VITALS — BP 158/87 | HR 63 | Ht 64.0 in | Wt 182.4 lb

## 2016-05-27 DIAGNOSIS — I10 Essential (primary) hypertension: Secondary | ICD-10-CM | POA: Diagnosis not present

## 2016-05-27 DIAGNOSIS — R079 Chest pain, unspecified: Secondary | ICD-10-CM | POA: Diagnosis not present

## 2016-05-27 DIAGNOSIS — E785 Hyperlipidemia, unspecified: Secondary | ICD-10-CM

## 2016-05-27 NOTE — Progress Notes (Signed)
OFFICE NOTE  Chief Complaint:  Follow-up stress test  Primary Care Physician: Gerrit Heck, MD  HPI:  Amber Brewer is a 65 y.o. female with a past medical history significant for hypertension, dyslipidemia, asthma/allergies, GERD, Schatzki's ring and prior esophageal dilatation about 5 years ago.  She now presents with recent episodes of squeezing in the chest which feel episodic and spasmodic. She is concerned that this may be esophageal spasm however her primary care provider wanted to rule out possible coronary disease. She feels a tightness in her chest that is not necessarily associated with eating or present with exertion or relieved by rest. She is also short of breath, particularly when walking up hills. The tightness is been coming on more recently. She's also been under significant stress and does have some history of anxiety. She is caring for disabled son and has an 100 year old mother who she is caring for. 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 She reports that she does have some mild trouble swallowing and occasionally food does get stuck in her mid throat and often times pills are difficult to swallow and may come up. She also reported a brief presyncopal episode however this is with significantly rotating her head about 90 the cause that quick decrease in vision which improved when straightening her head out. She does have a history of neck injury in the past.  02/26/2016  Amber Brewer returns today for follow-up of her nuclear stress test. This is an exercise study in which no ischemia was noted. LVEF was greater than 65%. She did have a hypertensive response to exercise with a peak systolic blood pressure 123456. Blood pressure appears that is not as well-controlled as it should be. We discussed her chest discomfort which she says she is had some improvement in. Given her prior esophageal dilatation history and Schatzki's ring, she may be  having esophageal spasm. I asked her to follow-up with her gastroenterologist about this. She may need manometry. If this is the case then she may benefit from addition of low-dose calcium channel blocker. She does not want to add any extra medication at this time, however blood pressure is suboptimally controlled.  05/27/2016  Amber Brewer returns today for follow-up. She continues to have some anxiety episodes. This is associated with some throat tightness. She also has a history of esophageal stricture and Schatzki's ring. She has made an appointment with her gastroenterologist but that is not until September. It's unclear whether these episodes are GI related or anxiety. I recently increased her Bystolic to 7.5 mg daily. Her home blood pressure readings indicate good control.  PMHx:  Past Medical History:  Diagnosis Date  . Hx of melanoma in situ 2002  . Hyperlipidemia   . Hypertension   . Hypothyroidism   . Kidney calculi   . Nephrolithiasis   . Osteoarthritis     Past Surgical History:  Procedure Laterality Date  . ABDOMINAL HYSTERECTOMY  1974  . APPENDECTOMY    . BLADDER SURGERY  1974  . BREAST SURGERY  2011  . CATARACT EXTRACTION  2015  . CHOLECYSTECTOMY    . removal of melanoma in situ      FAMHx:  Family History  Problem Relation Age of Onset  . Hypertension Mother     thyroid issues  . Heart attack Father   . Heart disease Father   . Thyroid disease Sister   . Hypertension Maternal Aunt   . Hypertension Maternal  Uncle   . Clotting disorder Maternal Uncle   . Hypertension Maternal Grandmother   . Hyperlipidemia Maternal Grandfather   . Hyperlipidemia Paternal Grandmother     also MI  . Breast cancer Paternal Grandmother   . Hyperlipidemia Paternal Grandfather     also MI  . Hypertension Sister   . Hypertension Son   . Endometriosis Daughter     SOCHx:   reports that she has never smoked. She has never used smokeless tobacco. She reports that she does not  drink alcohol or use drugs.  ALLERGIES:  Allergies  Allergen Reactions  . Codeine Phosphate     REACTION: unspecified  . Iodine     Broke out on skin once years ago  . Prednisone     ROS: Pertinent items noted in HPI and remainder of comprehensive ROS otherwise negative.  HOME MEDS: Current Outpatient Prescriptions  Medication Sig Dispense Refill  . acyclovir (ZOVIRAX) 400 MG tablet TAKE 1 TABLET BY MOUTH EVERY DAY 30 tablet 0  . Biotin 10 MG TABS Take by mouth.      . cholecalciferol (VITAMIN D) 1000 UNITS tablet Take 1,000 Units by mouth daily.    . cycloSPORINE (RESTASIS) 0.05 % ophthalmic emulsion 1 drop as needed.      Marland Kitchen estradiol (VIVELLE-DOT) 0.1 MG/24HR Place 1 patch onto the skin 2 (two) times a week.      . Flaxseed, Linseed, (FLAX SEED OIL) 1000 MG CAPS Take by mouth.      . fluticasone (FLONASE) 50 MCG/ACT nasal spray Place 1 spray into both nostrils as needed.    . hydrochlorothiazide 25 MG tablet TAKE 1 TABLET BY MOUTH ONCE DAILY FOR BLOOD PRESSURE 30 tablet 11  . irbesartan (AVAPRO) 300 MG tablet Take 300 mg by mouth at bedtime.      . Multiple Vitamin (MULTIVITAMIN) capsule Take 1 capsule by mouth daily.      . nebivolol (BYSTOLIC) 5 MG tablet Take 1.5 tablets (7.5 mg total) by mouth daily. 45 tablet 5  . Omega-3 Fatty Acids (FISH OIL) 1000 MG CAPS Take by mouth.      Marland Kitchen omeprazole (PRILOSEC) 20 MG capsule Take 1 capsule by mouth daily.    . potassium chloride (KAON-CL-10) 10 MEQ CR tablet Take 10 mEq by mouth daily.      Marland Kitchen PROAIR HFA 108 (90 Base) MCG/ACT inhaler as needed.    Marland Kitchen levothyroxine (SYNTHROID) 50 MCG tablet Take 1 tablet (50 mcg total) by mouth daily. 30 tablet 11  . PULMICORT FLEXHALER 180 MCG/ACT inhaler      Current Facility-Administered Medications  Medication Dose Route Frequency Provider Last Rate Last Dose  . TDaP (BOOSTRIX) injection 0.5 mL  0.5 mL Intramuscular Once Hoover Browns., MD        LABS/IMAGING: No results found for  this or any previous visit (from the past 48 hour(s)). No results found.  WEIGHTS: Wt Readings from Last 3 Encounters:  05/27/16 182 lb 6.4 oz (82.7 kg)  02/26/16 179 lb 6.4 oz (81.4 kg)  02/08/16 177 lb (80.3 kg)    VITALS: BP (!) 158/87   Pulse 63   Ht 5\' 4"  (1.626 m)   Wt 182 lb 6.4 oz (82.7 kg)   BMI 31.31 kg/m   EXAM: General appearance: alert and no distress Lungs: clear to auscultation bilaterally Heart: regular rate and rhythm, S1, S2 normal, no murmur, click, rub or gallop Extremities: extremities normal, atraumatic, no cyanosis or edema  EKG: Normal sinus  rhythm at 63  ASSESSMENT: 1. Chest pain, more likely esophageal spasm - low risk exercise nuclear stress test (2017) 2. Progressive dyspnea on exertion - improved 3. Hypertension - controlled 4. Dyslipidemia  PLAN: 1.  Mrs. Jagielski has adequate blood pressure control on her current regimen. She scheduled to see her gastroenterologist in September. We will follow-up in 6 months for a pressure recheck and likely do cholesterol reassessment at that time.  Pixie Casino, MD, [facility] Attending Cardiologist [place] 05/27/2016, 8:31 AM

## 2016-05-27 NOTE — Patient Instructions (Signed)
Your physician wants you to follow-up in: 6 months with Dr. Hilty. You will receive a reminder letter in the mail two months in advance. If you don't receive a letter, please call our office to schedule the follow-up appointment.    

## 2016-06-03 ENCOUNTER — Ambulatory Visit: Payer: PPO | Admitting: Internal Medicine

## 2016-06-18 ENCOUNTER — Other Ambulatory Visit: Payer: Self-pay | Admitting: Family Medicine

## 2016-06-18 DIAGNOSIS — Z1231 Encounter for screening mammogram for malignant neoplasm of breast: Secondary | ICD-10-CM

## 2016-07-01 ENCOUNTER — Encounter: Payer: Self-pay | Admitting: Internal Medicine

## 2016-07-01 ENCOUNTER — Ambulatory Visit (INDEPENDENT_AMBULATORY_CARE_PROVIDER_SITE_OTHER): Payer: PPO | Admitting: Internal Medicine

## 2016-07-01 VITALS — BP 130/90 | HR 68 | Ht 63.39 in | Wt 183.2 lb

## 2016-07-01 DIAGNOSIS — K219 Gastro-esophageal reflux disease without esophagitis: Secondary | ICD-10-CM | POA: Diagnosis not present

## 2016-07-01 DIAGNOSIS — R1310 Dysphagia, unspecified: Secondary | ICD-10-CM | POA: Diagnosis not present

## 2016-07-01 DIAGNOSIS — K222 Esophageal obstruction: Secondary | ICD-10-CM

## 2016-07-01 DIAGNOSIS — Z1211 Encounter for screening for malignant neoplasm of colon: Secondary | ICD-10-CM | POA: Diagnosis not present

## 2016-07-01 DIAGNOSIS — R1011 Right upper quadrant pain: Secondary | ICD-10-CM

## 2016-07-01 MED ORDER — NA SULFATE-K SULFATE-MG SULF 17.5-3.13-1.6 GM/177ML PO SOLN: 1.0000 | Freq: Once | ORAL | 0 refills | Status: AC

## 2016-07-01 NOTE — Patient Instructions (Signed)
You have been scheduled for an endoscopy. Please follow written instructions given to you at your visit today. If you use inhalers (even only as needed), please bring them with you on the day of your procedure.   

## 2016-07-01 NOTE — Progress Notes (Signed)
HISTORY OF PRESENT ILLNESS:  Amber Brewer is a 65 y.o. female who is sent by her primary care provider Dr. Drema Dallas questioning "esophageal spasm". Patient's other chief complaints or chronic right upper quadrant pain, GERD, and mild dysphagia. I have not seen the patient in many years. On 01/15/2006 she underwent colonoscopy to evaluate change in bowel habits, abdominal discomfort with bloating, and hematochezia. Examination revealed diverticulosis but was otherwise normal. She was treated with antispasmodics. Upper endoscopy was performed for dysphagia. She was found to have a benign distal esophageal stricture which was dilated with [facility] dilator. She states this helped. For GERD she takes omeprazole 20 mg daily. No active reflux symptoms. She does report some recurrence of intermittent solid food dysphagia. Her chief complaint is that of a "fluttering sensation in her mid chest". She notices this to be worse when she is anxious, exercising, or lying down at night. No relationship to meals. She has had negative cardiac evaluation. Next, she reports a 30 year history of right-sided abdominal discomfort which is really unchanged since her cholecystectomy in 1987. It is positional in nature. Other surgeries include appendectomy and hysterectomy. She has had no weight loss. Actually weight gain. She does have intermittent loose stools which are unchanged. She is due for repeat screening colonoscopy REVIEW OF SYSTEMS:  All non-GI ROS negative except for sinus and allergy, anxiety, arthritis, visual change, cough, depression, urinary leakage, excessive urination, ankle edema   Past Medical History:  Diagnosis Date  . Asthma   . Depression   . Gallstones   . GERD (gastroesophageal reflux disease)   . Hx of melanoma in situ 2002  . Hyperlipidemia   . Hypertension   . Hypothyroidism   . IBS (irritable bowel syndrome)   . Nephrolithiasis   . Osteoarthritis   . Status post dilation of esophageal  narrowing     Past Surgical History:  Procedure Laterality Date  . ABDOMINAL HYSTERECTOMY  1974  . APPENDECTOMY  1977  . BLADDER SURGERY  1974  . BREAST LUMPECTOMY Right 2011  . CATARACT EXTRACTION Bilateral 2015  . CHOLECYSTECTOMY  1987  . removal of melanoma in situ      Social History [facility] [employer]  reports that she has never smoked. She has never used smokeless tobacco. She reports that she does not drink alcohol or use drugs.  family history includes Breast cancer in her paternal grandmother; Clotting disorder in her maternal uncle; Colon cancer in her maternal uncle; Colon polyps in her maternal uncle; Diabetes in her maternal grandfather and maternal uncle; Endometriosis in her daughter; Heart attack in her father; Heart disease in her father, maternal grandfather, and paternal grandfather; Hyperlipidemia in her maternal grandfather, paternal grandfather, and paternal grandmother; Hypertension in her maternal aunt, maternal grandmother, maternal uncle, mother, sister, and son; Prostate cancer in her maternal uncle; Thyroid disease in her mother, sister, and sister.  Allergies  Allergen Reactions  . Codeine Phosphate Nausea And Vomiting    REACTION: unspecified  . Prednisone Shortness Of Breath  . Iodine Rash    Broke out on skin once years ago       PHYSICAL EXAMINATION: Vital signs: BP 130/90 (BP Location: Left Arm, Patient Position: Sitting, Cuff Size: Normal)   Pulse 68   Ht 5' 3.39" (1.61 m) Comment: height measured without shoes  Wt 183 lb 4 oz (83.1 kg)   BMI 32.07 kg/m   Constitutional: generally well-appearing, no acute distress Psychiatric: alert and oriented x3, cooperative Eyes: extraocular movements  intact, anicteric, conjunctiva pink Mouth: oral pharynx moist, no lesions Neck: supple no lymphadenopathy Cardiovascular: heart regular rate and rhythm, no murmur Lungs: clear to auscultation bilaterally Abdomen: soft, obese, nontender, nondistended, no  obvious ascites, no peritoneal signs, normal bowel sounds, no organomegaly Rectal: deferred until colonoscopy Extremities: no clubbing cyanosis or  lower extremity edema bilaterally Skin: no lesions on visible extremities Neuro: No focal deficits. Creon nerves intact  ASSESSMENT:  #1. Fluttering sensation in chest. Sounds like skeletal muscle fasciculation. Doubt esophageal spasm #2. GERD. Lasix symptoms controlled with PPI #3. Dysphagia. History of peptic stricture. Suspect the same #4. Chronic stable right quadrant discomfort likely secondary to adhesions/musculoskeletal #5. Screening colonoscopy. Index exam 2007 with diverticulosis   PLAN:  #1. Reflux precautions #2. Continue PPI. Lowest dose to control symptoms #3. Upper endoscopy with esophageal dilation.The nature of the procedure, as well as the risks, benefits, and alternatives were carefully and thoroughly reviewed with the patient. Ample time for discussion and questions allowed. The patient understood, was satisfied, and agreed to proceed. #4. Screening colonoscopy.The nature of the procedure, as well as the risks, benefits, and alternatives were carefully and thoroughly reviewed with the patient. Ample time for discussion and questions allowed. The patient understood, was satisfied, and agreed to proceed. #5. Reassurance regarding chest wall fasciculations and chronic right upper quadrant pain

## 2016-07-10 DIAGNOSIS — H04123 Dry eye syndrome of bilateral lacrimal glands: Secondary | ICD-10-CM | POA: Diagnosis not present

## 2016-07-11 DIAGNOSIS — L853 Xerosis cutis: Secondary | ICD-10-CM | POA: Diagnosis not present

## 2016-07-11 DIAGNOSIS — Z23 Encounter for immunization: Secondary | ICD-10-CM | POA: Diagnosis not present

## 2016-07-11 DIAGNOSIS — R7301 Impaired fasting glucose: Secondary | ICD-10-CM | POA: Diagnosis not present

## 2016-07-11 DIAGNOSIS — E039 Hypothyroidism, unspecified: Secondary | ICD-10-CM | POA: Diagnosis not present

## 2016-07-11 DIAGNOSIS — J309 Allergic rhinitis, unspecified: Secondary | ICD-10-CM | POA: Diagnosis not present

## 2016-07-11 DIAGNOSIS — E7800 Pure hypercholesterolemia, unspecified: Secondary | ICD-10-CM | POA: Diagnosis not present

## 2016-07-11 DIAGNOSIS — Z1159 Encounter for screening for other viral diseases: Secondary | ICD-10-CM | POA: Diagnosis not present

## 2016-07-11 DIAGNOSIS — J45909 Unspecified asthma, uncomplicated: Secondary | ICD-10-CM | POA: Diagnosis not present

## 2016-07-11 DIAGNOSIS — I10 Essential (primary) hypertension: Secondary | ICD-10-CM | POA: Diagnosis not present

## 2016-07-23 DIAGNOSIS — T887XXA Unspecified adverse effect of drug or medicament, initial encounter: Secondary | ICD-10-CM | POA: Diagnosis not present

## 2016-07-23 DIAGNOSIS — R05 Cough: Secondary | ICD-10-CM | POA: Diagnosis not present

## 2016-07-23 DIAGNOSIS — J9801 Acute bronchospasm: Secondary | ICD-10-CM | POA: Diagnosis not present

## 2016-07-30 ENCOUNTER — Ambulatory Visit
Admission: RE | Admit: 2016-07-30 | Discharge: 2016-07-30 | Disposition: A | Discharge status: Home / Self Care | Payer: PPO | Source: Ambulatory Visit | Attending: Family Medicine | Admitting: Family Medicine

## 2016-07-30 DIAGNOSIS — Z1231 Encounter for screening mammogram for malignant neoplasm of breast: Secondary | ICD-10-CM

## 2016-07-31 ENCOUNTER — Encounter: Payer: Self-pay | Admitting: Allergy and Immunology

## 2016-07-31 ENCOUNTER — Ambulatory Visit (INDEPENDENT_AMBULATORY_CARE_PROVIDER_SITE_OTHER): Payer: PPO | Admitting: Allergy and Immunology

## 2016-07-31 VITALS — BP 128/80 | HR 80 | Temp 97.5°F | Resp 16 | Ht 63.5 in | Wt 182.0 lb

## 2016-07-31 DIAGNOSIS — J328 Other chronic sinusitis: Secondary | ICD-10-CM | POA: Diagnosis not present

## 2016-07-31 DIAGNOSIS — J4541 Moderate persistent asthma with (acute) exacerbation: Secondary | ICD-10-CM

## 2016-07-31 DIAGNOSIS — K219 Gastro-esophageal reflux disease without esophagitis: Secondary | ICD-10-CM

## 2016-07-31 DIAGNOSIS — J4540 Moderate persistent asthma, uncomplicated: Secondary | ICD-10-CM | POA: Diagnosis not present

## 2016-07-31 DIAGNOSIS — J3089 Other allergic rhinitis: Secondary | ICD-10-CM

## 2016-07-31 MED ORDER — MOMETASONE FURO-FORMOTEROL FUM 200-5 MCG/ACT IN AERO: INHALATION_SPRAY | RESPIRATORY_TRACT | 5 refills | Status: DC

## 2016-07-31 MED ORDER — METHYLPREDNISOLONE ACETATE 80 MG/ML IJ SUSP
80.0000 mg | Freq: Once | INTRAMUSCULAR | Status: AC
  Administered 2016-07-31: 80 mg via INTRAMUSCULAR

## 2016-07-31 MED ORDER — OMEPRAZOLE 40 MG PO CPDR: DELAYED_RELEASE_CAPSULE | ORAL | 5 refills | Status: DC

## 2016-07-31 MED ORDER — RANITIDINE HCL 300 MG PO TABS: ORAL_TABLET | ORAL | 5 refills | Status: DC

## 2016-07-31 MED ORDER — MONTELUKAST SODIUM 10 MG PO TABS: ORAL_TABLET | ORAL | 5 refills | Status: DC

## 2016-07-31 MED ORDER — AMOXICILLIN-POT CLAVULANATE 875-125 MG PO TABS: ORAL_TABLET | ORAL | 0 refills | Status: DC

## 2016-07-31 NOTE — Patient Instructions (Addendum)
  1. Treat inflammation:   A. Dulera 200 - 2 inhalations twice a day with spacer  B. OTC Rhinocort one spray each nostril one time per day  C. montelukast 10 mg one tablet one time per day  D. Depo-Medrol 80 IM delivered in clinic  2. Treat reflux:   A. minimize all caffeine and chocolate and alcohol consumption  B. increase omeprazole to 40 mg in a.m.  C. start ranitidine 300 mg in PM  3. Treat infection:   A. Augmentin 875 one tablet twice a day for the next 14 days  4. If needed:   A. nasal saline wash  B. ProAir HFA 2 puffs every 4-6 hours  C. OTC antihistamine - Claritin/Allegra/Zyrtec  D. OTC Mucinex DM 2 tablets twice a day  5. Return to clinic in 3 weeks or earlier if problem  6. Obtain flu vaccine at end of October

## 2016-07-31 NOTE — Progress Notes (Signed)
Dear Dr. Drema Dallas,  Thank you for referring Amber Brewer to the [facility] on 07/31/2016.   Below is a summation of this patient's evaluation and recommendations.  Thank you for your referral. I will keep you informed about this patient's response to treatment.   If you have any questions please to do hesitate to contact me.   Sincerely,  Jiles Prows, MD [place] [facility]   ______________________________________________________________________    NEW PATIENT NOTE  Referring Provider: Leighton Ruff, MD Primary Provider: Gerrit Heck, MD Date of office visit: 07/31/2016    Subjective:   Chief Complaint:  Amber Brewer (DOB: [birth year]) is a 65 y.o. female who presents to the clinic on 07/31/2016 with a chief complaint of New Patient (Initial Visit) (pt is just getting over pneumonia. She has is having some coughing with wheezing.); Cough; and Wheezing .     HPI: Amber Brewer presents to this clinic in evaluation of respiratory tract symptoms that become quite significant since January 2017. I had originally seen her in his clinic almost 10 years ago for issues with asthma and allergic rhinitis and reflux-induced respiratory disease.  Apparently in January 2017 she developed rather significant shortness of breath where she felt like she could not get air into her chest and she became quite short of breath whenever she laid on her back and had very significant dyspnea on exertion where she would get out of breath walking in the mall. She underwent cardiac evaluation which apparently was negative. In association with her shortness of breath she was coughing. Her coughing was disturbing her sleep. She would almost vomit and have micturition associated with her cough. She was apparently treated for bronchitis a few months ago with a Z-Pak which really did not help her. She was started on  Pulmicort which has not really helped her. She apparently had a chest x-ray which was normal. The administration of a short acting bronchodilator does not help.  Presently she complains about cough and throat clearing and a glob stuck in her throat and runny nose and clear to green nasal discharge and a decreased ability to smell.  There is no obvious provoking factor giving rise to her symptoms. She has not had a change in her environment.  Apparently she developed "pulsations" in her throat and she was placed on omeprazole for this issue assuming she had esophageal dysmotility. In the past she was using Nexium but for some reason that medication got terminated. She is scheduled to see Dr. Henrene Pastor next month to have an upper endoscopy performed.  Past Medical History:  Diagnosis Date  . Asthma   . Depression   . Gallstones   . GERD (gastroesophageal reflux disease)   . Hx of melanoma in situ 2002  . Hyperlipidemia   . Hypertension   . Hypothyroidism   . IBS (irritable bowel syndrome)   . Nephrolithiasis   . Osteoarthritis   . Status post dilation of esophageal narrowing     Past Surgical History:  Procedure Laterality Date  . ABDOMINAL HYSTERECTOMY  1974  . APPENDECTOMY  1977  . BLADDER SURGERY  1974  . BREAST LUMPECTOMY Right 2011  . CATARACT EXTRACTION Bilateral 2015  . CHOLECYSTECTOMY  1987  . removal of melanoma in situ        Medication List      acyclovir 400 MG tablet Commonly known as:  ZOVIRAX TAKE 1  TABLET BY MOUTH EVERY DAY   Biotin 10 MG Tabs Take by mouth.   cholecalciferol 1000 units tablet Commonly known as:  VITAMIN D Take 1,000 Units by mouth daily.   cycloSPORINE 0.05 % ophthalmic emulsion Commonly known as:  RESTASIS 1 drop as needed.   estradiol 0.1 MG/24HR patch Commonly known as:  VIVELLE-DOT Place 1 patch onto the skin 2 (two) times a week.   Fish Oil 1000 MG Caps Take by mouth.   Flax Seed Oil 1000 MG Caps Take by mouth as needed.    fluticasone 50 MCG/ACT nasal spray Commonly known as:  FLONASE Place 1 spray into both nostrils as needed.   hydrochlorothiazide 25 MG tablet Commonly known as:  HYDRODIURIL TAKE 1 TABLET BY MOUTH ONCE DAILY FOR BLOOD PRESSURE   irbesartan 300 MG tablet Commonly known as:  AVAPRO Take 300 mg by mouth at bedtime.   KAON-CL-10 10 MEQ CR tablet Generic drug:  potassium chloride Take 10 mEq by mouth daily.   levothyroxine 50 MCG tablet Commonly known as:  SYNTHROID Take 1 tablet (50 mcg total) by mouth daily.   multivitamin capsule Take 1 capsule by mouth daily.   nebivolol 5 MG tablet Commonly known as:  BYSTOLIC Take 1.5 tablets (7.5 mg total) by mouth daily.   omeprazole 20 MG capsule Commonly known as:  PRILOSEC Take one capsule every morning before breakfast   PROAIR HFA 108 (90 Base) MCG/ACT inhaler Generic drug:  albuterol as needed.   PULMICORT FLEXHALER 180 MCG/ACT inhaler Generic drug:  budesonide       Allergies  Allergen Reactions  . Codeine Phosphate Nausea And Vomiting    REACTION: unspecified  . Prednisone Shortness Of Breath  . Iodine Rash    Broke out on skin once years ago    Review of systems negative except as noted in HPI / PMHx or noted below:  Review of Systems  Constitutional: Negative.   HENT: Negative.   Eyes: Negative.   Respiratory: Negative.   Cardiovascular: Negative.   Gastrointestinal: Negative.   Genitourinary: Negative.   Musculoskeletal: Negative.   Skin: Negative.   Neurological: Negative.   Endo/Heme/Allergies: Negative.   Psychiatric/Behavioral: Negative.     Family History  Problem Relation Age of Onset  . Hypertension Mother   . Thyroid disease Mother   . Heart attack Father   . Heart disease Father   . Thyroid disease Sister   . Hypertension Maternal Aunt   . Hypertension Maternal Uncle   . Clotting disorder Maternal Uncle   . Prostate cancer Maternal Uncle   . Colon cancer Maternal Uncle   . Colon  polyps Maternal Uncle   . Diabetes Maternal Uncle     all uncles  . Hypertension Maternal Grandmother   . Hyperlipidemia Maternal Grandfather   . Diabetes Maternal Grandfather   . Heart disease Maternal Grandfather   . Hyperlipidemia Paternal Grandmother     also MI  . Breast cancer Paternal Grandmother   . Hyperlipidemia Paternal Grandfather     also MI  . Heart disease Paternal Grandfather   . Hypertension Sister   . Thyroid disease Sister   . Hypertension Son   . Endometriosis Daughter     Social History   Social History  . Marital status: Married    Spouse name: N/A  . Number of children: 2  . Years of education: 13   Occupational History  . licensed optician/retired Other    Office manager   Social History  Main Topics  . Smoking status: Never Smoker  . Smokeless tobacco: Never Used  . Alcohol use No  . Drug use: No  . Sexual activity: Yes    Birth control/ protection: Post-menopausal   Other Topics Concern  . Not on file   Social History Narrative   epworth sleepiness scale = 8 (01/18/2016)    Environmental and Social history  Lives in a house with a dry environment, 2 dogs located inside the household, hardwood in the bedroom, no plastic on the bed or pillow, and no smoking ongoing with inside the household. She is an optician  Objective:   Vitals:   07/31/16 0834  BP: 128/80  Pulse: 80  Resp: 16  Temp: 97.5 F (36.4 C)   Height: 5' 3.5" (161.3 cm) Weight: 182 lb (82.6 kg)  Physical Exam  Constitutional: She is well-developed, well-nourished, and in no distress.  Constant throat clearing, nasal voice, coughing  HENT:  Head: Normocephalic. Head is without right periorbital erythema and without left periorbital erythema.  Right Ear: Tympanic membrane, external ear and ear canal normal.  Left Ear: Tympanic membrane, external ear and ear canal normal.  Nose: Mucosal edema present. No rhinorrhea.  Mouth/Throat: Uvula is midline, oropharynx is  clear and moist and mucous membranes are normal. No oropharyngeal exudate.  Eyes: Conjunctivae and lids are normal. Pupils are equal, round, and reactive to light.  Neck: Trachea normal. No tracheal tenderness present. No tracheal deviation present. No thyromegaly present.  Cardiovascular: Normal rate, regular rhythm, S1 normal, S2 normal and normal heart sounds.   No murmur heard. Pulmonary/Chest: Effort normal and breath sounds normal. No stridor. No tachypnea. No respiratory distress. She has no wheezes. She has no rales. She exhibits no tenderness.  Abdominal: Soft. She exhibits no distension and no mass. There is no hepatosplenomegaly. There is no tenderness. There is no rebound and no guarding.  Musculoskeletal: She exhibits no edema or tenderness.  Lymphadenopathy:       Head (right side): No tonsillar adenopathy present.       Head (left side): No tonsillar adenopathy present.    She has no cervical adenopathy.    She has no axillary adenopathy.  Neurological: She is alert. Gait normal.  Skin: No rash noted. She is not diaphoretic. No erythema. No pallor. Nails show no clubbing.  Psychiatric: Mood and affect normal.    Diagnostics: Allergy skin tests were not performed.   Spirometry was performed and demonstrated an FEV1 of 1.63 @ 73 % of predicted. Following the administration of nebulized albuterol her FEV1 rose to 1.73 which was an increase of 6%  Results of the chest x-ray dated 12/14/2015 was essentially normal other than enlargement of cardiac silhouette  Assessment and Plan:    1. Asthma, not well controlled, moderate persistent, with acute exacerbation   2. Other allergic rhinitis   3. LPRD (laryngopharyngeal reflux disease)   4. Other chronic sinusitis     1. Treat inflammation:   A. Dulera 200 - 2 inhalations twice a day with spacer  B. OTC Rhinocort one spray each nostril one time per day  C. montelukast 10 mg one tablet one time per day  D. Depo-Medrol 80 IM  delivered in clinic  2. Treat reflux:   A. minimize all caffeine and chocolate and alcohol consumption  B. increase omeprazole to 40 mg in a.m.  C. start ranitidine 300 mg in PM  3. Treat infection:   A. Augmentin 875 one tablet twice a day  for the next 14 days  4. If needed:   A. nasal saline wash  B. ProAir HFA 2 puffs every 4-6 hours  C. OTC antihistamine - Claritin/Allegra/Zyrtec  D. OTC Mucinex DM 2 tablets twice a day  5. Return to clinic in 3 weeks or earlier if problem  6. Obtain flu vaccine at end of October  Amber Brewer appears to have a combination of various etiologic agents contributing to inflammation and irritation of her respiratory tract including some degree of atopic inflammation and reflux-induced respiratory disease and probable component of chronic sinusitis which I'm going to address with the therapy mentioned above. I will see her back in this clinic in 3 weeks or earlier if there is a problem.  Jiles Prows, MD [facility]

## 2016-08-21 ENCOUNTER — Encounter: Payer: Self-pay | Admitting: Allergy and Immunology

## 2016-08-21 ENCOUNTER — Ambulatory Visit (INDEPENDENT_AMBULATORY_CARE_PROVIDER_SITE_OTHER): Payer: PPO | Admitting: Allergy and Immunology

## 2016-08-21 VITALS — BP 146/74 | HR 76 | Resp 22

## 2016-08-21 DIAGNOSIS — J328 Other chronic sinusitis: Secondary | ICD-10-CM | POA: Diagnosis not present

## 2016-08-21 DIAGNOSIS — K219 Gastro-esophageal reflux disease without esophagitis: Secondary | ICD-10-CM

## 2016-08-21 DIAGNOSIS — J3089 Other allergic rhinitis: Secondary | ICD-10-CM

## 2016-08-21 DIAGNOSIS — J4540 Moderate persistent asthma, uncomplicated: Secondary | ICD-10-CM | POA: Diagnosis not present

## 2016-08-21 MED ORDER — FLUTICASONE FUROATE 100 MCG/ACT IN AEPB: 1.0000 | INHALATION_SPRAY | Freq: Every day | RESPIRATORY_TRACT | 5 refills | Status: DC

## 2016-08-21 NOTE — Patient Instructions (Addendum)
    1. Continue to Treat inflammation:   A. Change Dulera to Arnuity 100 one inhalation one time per day.  B. OTC Rhinocort one spray each nostril one time per day  C. montelukast 10 mg one tablet one time per day  2. Continue to Treat reflux:   A. minimize all caffeine and chocolate and alcohol consumption  B. omeprazole 20 mg in a.m.  C. ranitidine 300 mg in PM  3. If needed:   A. nasal saline wash  B. ProAir HFA 2 puffs every 4-6 hours  C. OTC antihistamine - Claritin/Allegra/Zyrtec  D. OTC Mucinex DM 2 tablets twice a day  5. Return to clinic in 12 weeks or earlier if problem

## 2016-08-21 NOTE — Progress Notes (Signed)
Follow-up Note  Referring Provider: Leighton Ruff, MD Primary Provider: Gerrit Heck, MD Date of Office Visit: 08/21/2016  Subjective:   Amber Brewer (DOB: 1951-05-02) is a 65 y.o. female who returns to the [facility] on 08/21/2016 in re-evaluation of the following:  HPI: [name] returns to this clinic in reevaluation of her respiratory tract issues tied up with a combination of asthma, allergic rhinitis, and reflux-induced respiratory disease and chronic sinusitis.  While utilizing medical therapy established during her initial evaluation of 07/31/2016 she has resolved all of her respiratory tract symptoms. She has almost no cough. She's had a dramatic decrease in her postnasal drip and nasal discharge. She no longer has green nasal discharge and can now smell. She does not need to use a short acting bronchodilator.  Although she has had improvement regarding all these issues she has developed side effects. She has cramps in her feet and she feels shaky throughout the day. As well, she had a little bit more burping when using omeprazole 40 mg daily and she dropped back down to 20 mg.    Medication List      acyclovir 400 MG tablet Commonly known as:  ZOVIRAX TAKE 1 TABLET BY MOUTH EVERY DAY   Biotin 10 MG Tabs Take by mouth.   cholecalciferol 1000 units tablet Commonly known as:  VITAMIN D Take 1,000 Units by mouth daily.   cycloSPORINE 0.05 % ophthalmic emulsion Commonly known as:  RESTASIS 1 drop as needed.   estradiol 0.1 MG/24HR patch Commonly known as:  VIVELLE-DOT Place 1 patch onto the skin 2 (two) times a week.   Fish Oil 1000 MG Caps Take by mouth.   Flax Seed Oil 1000 MG Caps Take by mouth as needed.   fluticasone 50 MCG/ACT nasal spray Commonly known as:  FLONASE Place 1 spray into both nostrils as needed.   hydrochlorothiazide 25 MG tablet Commonly known as:  HYDRODIURIL TAKE 1 TABLET BY MOUTH ONCE DAILY FOR BLOOD  PRESSURE   irbesartan 300 MG tablet Commonly known as:  AVAPRO Take 300 mg by mouth at bedtime.   KAON-CL-10 10 MEQ CR tablet Generic drug:  potassium chloride Take 10 mEq by mouth daily.   levothyroxine 50 MCG tablet Commonly known as:  SYNTHROID Take 1 tablet (50 mcg total) by mouth daily.   mometasone-formoterol 200-5 MCG/ACT Aero Commonly known as:  DULERA Inhale two puffs twice daily to prevent cough or wheeze. Rinse mouth after use.   montelukast 10 MG tablet Commonly known as:  SINGULAIR Take one tablet once daily   multivitamin capsule Take 1 capsule by mouth daily.   nebivolol 5 MG tablet Commonly known as:  BYSTOLIC Take 1.5 tablets (7.5 mg total) by mouth daily.   omeprazole 40 MG capsule Commonly known as:  PRILOSEC Take one capsule every morning before breakfast   PROAIR HFA 108 (90 Base) MCG/ACT inhaler Generic drug:  albuterol as needed.   PULMICORT FLEXHALER 180 MCG/ACT inhaler Generic drug:  budesonide   ranitidine 300 MG tablet Commonly known as:  ZANTAC Take one tablet once daily       Past Medical History:  Diagnosis Date  . Asthma   . Depression   . Gallstones   . GERD (gastroesophageal reflux disease)   . Hx of melanoma in situ 2002  . Hyperlipidemia   . Hypertension   . Hypothyroidism   . IBS (irritable bowel syndrome)   . Nephrolithiasis   . Osteoarthritis   .  Status post dilation of esophageal narrowing     Past Surgical History:  Procedure Laterality Date  . ABDOMINAL HYSTERECTOMY  1974  . APPENDECTOMY  1977  . BLADDER SURGERY  1974  . BREAST LUMPECTOMY Right 2011  . CATARACT EXTRACTION Bilateral 2015  . CHOLECYSTECTOMY  1987  . removal of melanoma in situ      Allergies  Allergen Reactions  . Codeine Phosphate Nausea And Vomiting    REACTION: unspecified  . Prednisone Shortness Of Breath  . Iodine Rash    Broke out on skin once years ago    Review of systems negative except as noted in HPI / PMHx or noted  below:  Review of Systems  Constitutional: Negative.   HENT: Negative.   Eyes: Negative.   Respiratory: Negative.   Cardiovascular: Negative.   Gastrointestinal: Negative.   Genitourinary: Negative.   Musculoskeletal: Negative.   Skin: Negative.   Neurological: Negative.   Endo/Heme/Allergies: Negative.   Psychiatric/Behavioral: Negative.      Objective:   Vitals:   08/21/16 0839  BP: (!) 146/74  Pulse: 76  Resp: (!) 22          Physical Exam  Constitutional: She is well-developed, well-nourished, and in no distress.  HENT:  Head: Normocephalic.  Right Ear: Tympanic membrane, external ear and ear canal normal.  Left Ear: Tympanic membrane, external ear and ear canal normal.  Nose: Nose normal. No mucosal edema or rhinorrhea.  Mouth/Throat: Uvula is midline, oropharynx is clear and moist and mucous membranes are normal. No oropharyngeal exudate.  Eyes: Conjunctivae are normal.  Neck: Trachea normal. No tracheal tenderness present. No tracheal deviation present. No thyromegaly present.  Cardiovascular: Normal rate, regular rhythm, S1 normal, S2 normal and normal heart sounds.   No murmur heard. Pulmonary/Chest: Breath sounds normal. No stridor. No respiratory distress. She has no wheezes. She has no rales.  Musculoskeletal: She exhibits no edema.  Lymphadenopathy:       Head (right side): No tonsillar adenopathy present.       Head (left side): No tonsillar adenopathy present.    She has no cervical adenopathy.  Neurological: She is alert. Gait normal.  Skin: No rash noted. She is not diaphoretic. No erythema. Nails show no clubbing.  Psychiatric: Mood and affect normal.    Diagnostics:    Spirometry was performed and demonstrated an FEV1 of 1.83 at 79 % of predicted.  Assessment and Plan:   1. Asthma, moderate persistent, well-controlled   2. Other allergic rhinitis   3. LPRD (laryngopharyngeal reflux disease)   4. Other chronic sinusitis      1.  Continue to Treat inflammation:   A. Change Dulera to Arnuity 100 one inhalation one time per day.  B. OTC Rhinocort one spray each nostril one time per day  C. montelukast 10 mg one tablet one time per day  2. Continue to Treat reflux:   A. minimize all caffeine and chocolate and alcohol consumption  B. omeprazole 20 mg in a.m.  C. ranitidine 300 mg in PM  3. If needed:   A. nasal saline wash  B. ProAir HFA 2 puffs every 4-6 hours  C. OTC antihistamine - Claritin/Allegra/Zyrtec  D. OTC Mucinex DM 2 tablets twice a day  5. Return to clinic in 12 weeks or earlier if problem  Korrine is obviously doing a lot better with her current medical plan however she has developed side effects which are most likely secondary to the use of her [facility]  and I'm going to have her switch that medication to a single component inhaler using inhaled fluticasone. She will continue to utilize medical therapy for inflammation of her respiratory tract and also for her reflux as stated above and I will see her back in this clinic in 12 weeks or earlier if there is a problem.  Allena Katz, MD [place]

## 2016-08-30 ENCOUNTER — Encounter: Payer: Self-pay | Admitting: Internal Medicine

## 2016-09-10 ENCOUNTER — Encounter: Payer: PPO | Admitting: Internal Medicine

## 2016-10-01 ENCOUNTER — Other Ambulatory Visit: Payer: Self-pay | Admitting: Internal Medicine

## 2016-10-01 NOTE — Telephone Encounter (Signed)
REFILL 

## 2016-10-31 ENCOUNTER — Encounter: Payer: Self-pay | Admitting: Internal Medicine

## 2016-10-31 ENCOUNTER — Telehealth: Payer: Self-pay | Admitting: Internal Medicine

## 2016-10-31 ENCOUNTER — Ambulatory Visit (INDEPENDENT_AMBULATORY_CARE_PROVIDER_SITE_OTHER): Payer: PPO | Admitting: Internal Medicine

## 2016-10-31 VITALS — BP 151/84 | HR 63 | Ht 64.0 in | Wt 182.2 lb

## 2016-10-31 DIAGNOSIS — I10 Essential (primary) hypertension: Secondary | ICD-10-CM | POA: Diagnosis not present

## 2016-10-31 DIAGNOSIS — F419 Anxiety disorder, unspecified: Secondary | ICD-10-CM | POA: Insufficient documentation

## 2016-10-31 DIAGNOSIS — I493 Ventricular premature depolarization: Secondary | ICD-10-CM

## 2016-10-31 MED ORDER — CARVEDILOL 12.5 MG PO TABS: 12.5000 mg | ORAL_TABLET | Freq: Two times a day (BID) | ORAL | 3 refills | Status: DC

## 2016-10-31 NOTE — Patient Instructions (Signed)
STOP bystolic START carvedilol 12.5mg  twice daily  Your physician recommends that you schedule a follow-up appointment in: [place] with Dr. Debara Pickett

## 2016-10-31 NOTE — Progress Notes (Signed)
OFFICE NOTE  Chief Complaint:  Feels faint, having palpitations  Primary Care Physician: Gerrit Heck, MD  HPI:  Amber Brewer is a 66 y.o. female with a past medical history significant for hypertension, dyslipidemia, asthma/allergies, GERD, Schatzki's ring and prior esophageal dilatation about 5 years ago.  She now presents with recent episodes of squeezing in the chest which feel episodic and spasmodic. She is concerned that this may be esophageal spasm however her primary care provider wanted to rule out possible coronary disease. She feels a tightness in her chest that is not necessarily associated with eating or present with exertion or relieved by rest. She is also short of breath, particularly when walking up hills. The tightness is been coming on more recently. She's also been under significant stress and does have some history of anxiety. She is caring for disabled son and has an 8 year old mother who she is caring for. 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 She reports that she does have some mild trouble swallowing and occasionally food does get stuck in her mid throat and often times pills are difficult to swallow and may come up. She also reported a brief presyncopal episode however this is with significantly rotating her head about 90 the cause that quick decrease in vision which improved when straightening her head out. She does have a history of neck injury in the past.  02/26/2016  [name] returns today for follow-up of her nuclear stress test. This is an exercise study in which no ischemia was noted. LVEF was greater than 65%. She did have a hypertensive response to exercise with a peak systolic blood pressure 123456. Blood pressure appears that is not as well-controlled as it should be. We discussed her chest discomfort which she says she is had some improvement in. Given her prior esophageal dilatation history and Schatzki's ring,  she may be having esophageal spasm. I asked her to follow-up with her gastroenterologist about this. She may need manometry. If this is the case then she may benefit from addition of low-dose calcium channel blocker. She does not want to add any extra medication at this time, however blood pressure is suboptimally controlled.  05/27/2016  [name] returns today for follow-up. She continues to have some anxiety episodes. This is associated with some throat tightness. She also has a history of esophageal stricture and Schatzki's ring. She has made an appointment with her gastroenterologist but that is not until September. It's unclear whether these episodes are GI related or anxiety. I recently increased her Bystolic to 7.5 mg daily. Her home blood pressure readings indicate good control.  11/08/2015  [name] was seen today as an add-on for feeling faint and having palpitations. An EKG in the office shows frequent unifocal PVCs. This is symptomatically similar to what she had when I saw her back in August however she is having more frequent episodes. She is also under significant stress. Her husband recently had intracranial hemorrhage and had emergency brain surgery. She also takes care of her elderly mother and disabled child. This causes a lot of stress in her life. She's had asthma however she is on inhaled steroid and rarely uses a beta agonist. She is on Bystolic however that has very poor ability to block PVCs. LV function was normal this past year in April 2017 by stress test without any ischemia, therefore not concerned about these being ischemic PVCs.  PMHx:  Past Medical History:  Diagnosis  Date  . Asthma   . Depression   . Gallstones   . GERD (gastroesophageal reflux disease)   . Hx of melanoma in situ 2002  . Hyperlipidemia   . Hypertension   . Hypothyroidism   . IBS (irritable bowel syndrome)   . Nephrolithiasis   . Osteoarthritis   . Status post dilation of esophageal  narrowing     Past Surgical History:  Procedure Laterality Date  . ABDOMINAL HYSTERECTOMY  1974  . APPENDECTOMY  1977  . BLADDER SURGERY  1974  . BREAST LUMPECTOMY Right 2011  . CATARACT EXTRACTION Bilateral 2015  . CHOLECYSTECTOMY  1987  . removal of melanoma in situ      FAMHx:  Family History  Problem Relation Age of Onset  . Hypertension Mother   . Thyroid disease Mother   . Heart attack Father   . Heart disease Father   . Thyroid disease Sister   . Hypertension Maternal Aunt   . Hypertension Maternal Uncle   . Clotting disorder Maternal Uncle   . Prostate cancer Maternal Uncle   . Colon cancer Maternal Uncle   . Colon polyps Maternal Uncle   . Diabetes Maternal Uncle     all uncles  . Hypertension Maternal Grandmother   . Hyperlipidemia Maternal Grandfather   . Diabetes Maternal Grandfather   . Heart disease Maternal Grandfather   . Hyperlipidemia Paternal Grandmother     also MI  . Breast cancer Paternal Grandmother   . Hyperlipidemia Paternal Grandfather     also MI  . Heart disease Paternal Grandfather   . Hypertension Sister   . Thyroid disease Sister   . Hypertension Son   . Endometriosis Daughter     SOCHx:   reports that she has never smoked. She has never used smokeless tobacco. She reports that she does not drink alcohol or use drugs.  ALLERGIES:  Allergies  Allergen Reactions  . Codeine Phosphate Nausea And Vomiting    REACTION: unspecified  . Prednisone Shortness Of Breath  . Iodine Rash    Broke out on skin once years ago    ROS: Pertinent items noted in HPI and remainder of comprehensive ROS otherwise negative.  HOME MEDS: Current Outpatient Prescriptions  Medication Sig Dispense Refill  . acyclovir (ZOVIRAX) 200 MG capsule Take 200 mg by mouth 2 (two) times a week.    . Biotin 10 MG TABS Take by mouth.      . budesonide (RHINOCORT ALLERGY) 32 MCG/ACT nasal spray Place 1 spray into both nostrils daily as needed.     Marland Kitchen BYSTOLIC 5 MG  tablet TAKE ONE & ONE-HALF TABLETS BY MOUTH ONCE DAILY 135 tablet 1  . cholecalciferol (VITAMIN D) 1000 UNITS tablet Take 5,000 Units by mouth daily.     . cycloSPORINE (RESTASIS) 0.05 % ophthalmic emulsion 1 drop as needed.      . Flaxseed, Linseed, (FLAX SEED OIL) 1000 MG CAPS Take by mouth as needed.     . Fluticasone Furoate (ARNUITY ELLIPTA) 100 MCG/ACT AEPB Inhale 1 Dose into the lungs daily. Rinse, gargle, and spit after use. 30 each 5  . hydrochlorothiazide 25 MG tablet TAKE 1 TABLET BY MOUTH ONCE DAILY FOR BLOOD PRESSURE 30 tablet 11  . irbesartan (AVAPRO) 300 MG tablet Take 300 mg by mouth at bedtime.      . montelukast (SINGULAIR) 10 MG tablet Take one tablet once daily 30 tablet 5  . Multiple Vitamins-Minerals (ZINC PO) Take by mouth daily.    Marland Kitchen  Omega-3 Fatty Acids (FISH OIL) 1000 MG CAPS Take by mouth.      Marland Kitchen omeprazole (PRILOSEC) 20 MG capsule     . potassium chloride (KAON-CL-10) 10 MEQ CR tablet Take 10 mEq by mouth daily.      Marland Kitchen PROAIR HFA 108 (90 Base) MCG/ACT inhaler as needed.    . ranitidine (ZANTAC) 300 MG tablet Take one tablet once daily 30 tablet 5  . SYNTHROID 50 MCG tablet     . estradiol (VIVELLE-DOT) 0.05 MG/24HR patch Place 1 patch onto the skin 2 (two) times a week.    . levothyroxine (SYNTHROID) 50 MCG tablet Take 1 tablet (50 mcg total) by mouth daily. 30 tablet 11   Current Facility-Administered Medications  Medication Dose Route Frequency Provider Last Rate Last Dose  . TDaP (BOOSTRIX) injection 0.5 mL  0.5 mL Intramuscular Once Hoover Browns., MD        LABS/IMAGING: No results found for this or any previous visit (from the past 48 hour(s)). No results found.  WEIGHTS: Wt Readings from Last 3 Encounters:  10/31/16 182 lb 3.2 oz (82.6 kg)  07/31/16 182 lb (82.6 kg)  07/01/16 183 lb 4 oz (83.1 kg)    VITALS: BP (!) 151/84   Pulse 63   Ht 5\' 4"  (1.626 m)   Wt 182 lb 3.2 oz (82.6 kg)   BMI 31.27 kg/m   EXAM: General appearance:  alert and no distress Lungs: clear to auscultation bilaterally Heart: irregularly irregular rhythm Extremities: extremities normal, atraumatic, no cyanosis or edema  EKG: Sinus rhythm with frequent PVCs at 63  ASSESSMENT: 1. Frequent symptomatic PVC's 2. Chest pain, more likely esophageal spasm - low risk exercise nuclear stress test (2017) 3. Progressive dyspnea on exertion - improved 4. Hypertension - controlled 5. Dyslipidemia  PLAN: 1.  Mrs. Fowles is having frequent PVCs which I think are causing her her symptoms. I like to discontinue her Bystolic and switch her to carvedilol 12.5 mg twice a day. She also has some positional change dizziness. This could be related to some dehydration. We may need to consider decreasing her hydrochlorothiazide when she follows up in 2 weeks.  Pixie Casino, MD, [facility] Attending Cardiologist [place] C Yuval Rubens 10/31/2016, 1:46 PM

## 2016-10-31 NOTE — Telephone Encounter (Signed)
Spoke w patient.  Seen in August by Dr. Debara Pickett She notes she's had recurrent palpitations, feeling like her heart is out of rhythm for "months", "maybe a year". She had a scare this morning where she felt dizzy, faint for approx 1 hr, notes this resolved. She's having no lingering symptoms. Denies headache, aura, LOC changes, etc.  She denies SOB, chest pain, though endorses complaint of small pain in back at times w exertion/ambulation - she's unsure if this is related.  Pt then was dismissive of concerns - "Now that I'm calling I feel stupid, it's probably nothing". I encouraged her that it was reasonable for her to call, and that I'd feel better to have her seen in office today if she's able. She was agreeable to this, appt w Dr. Debara Pickett or APP as available.  Discussed w Eliezer Lofts - a 1:30pm spot was open and OK to add. Pt aware to come for this appt and arrive early to update any pertinent information. She was appreciative of arrangements.

## 2016-10-31 NOTE — Telephone Encounter (Signed)
[name] states that she feels like she is going to faint and has felt this way all morning. She also reported an irregular heartbeat. Please call, she is anxious and nervous. Thanks.

## 2016-11-01 ENCOUNTER — Telehealth: Payer: Self-pay | Admitting: Internal Medicine

## 2016-11-01 NOTE — Telephone Encounter (Signed)
Pt called regarding her med being changed on yesterday's ov, she has taken two pills so far and feels faint and dizzy-BP 109/63 sitting at 1025am this am-pls advise

## 2016-11-01 NOTE — Telephone Encounter (Signed)
Returned call to patient-patient reports short episode this AM when she felt "heavy" and "faint", as if she was going to pass out.  Denies LOC, CP, SOB.  Reports BP this AM 109/63 and HR 57.  Reports episode lasted approximately 3 mins and occurred after taking a shower.  At time of call-patient reports symptoms resolved and she is feeling much better compared to before the medication change (see OV note 1/11).  Reports "I feel like I can actually breath now".    Patient had OV yesterday with Dr. Debara Pickett for feeling faint and having palpitations.  Advised to continue to monitor at this time.  Advised to call if episodes reoccur or with further concerns. Advised to continue to monitor BP as well.   Pt agreed and verbalized understanding.

## 2016-11-11 ENCOUNTER — Ambulatory Visit (INDEPENDENT_AMBULATORY_CARE_PROVIDER_SITE_OTHER): Payer: PPO | Admitting: Internal Medicine

## 2016-11-11 ENCOUNTER — Encounter: Payer: Self-pay | Admitting: Internal Medicine

## 2016-11-11 VITALS — BP 100/62 | HR 69 | Ht 64.0 in | Wt 182.0 lb

## 2016-11-11 DIAGNOSIS — I493 Ventricular premature depolarization: Secondary | ICD-10-CM

## 2016-11-11 DIAGNOSIS — I10 Essential (primary) hypertension: Secondary | ICD-10-CM | POA: Diagnosis not present

## 2016-11-11 DIAGNOSIS — R0602 Shortness of breath: Secondary | ICD-10-CM

## 2016-11-11 MED ORDER — IRBESARTAN 150 MG PO TABS
150.0000 mg | ORAL_TABLET | Freq: Every day | ORAL | 5 refills | Status: DC
Start: 2016-11-11 — End: 2016-12-19

## 2016-11-11 NOTE — Patient Instructions (Addendum)
Your physician has recommended you make the following change in your medication:  1. INCREASE carvedilol to 25mg  in the evening 2. DECREASE irbesartan to 150mg  daily  Your physician recommends that you schedule a follow-up appointment in: [place] with Dr. Debara Pickett

## 2016-11-11 NOTE — Progress Notes (Signed)
OFFICE NOTE  Chief Complaint:  Symptoms have improved  Primary Care Physician: Gerrit Heck, MD  HPI:  Amber Brewer is a 66 y.o. female with a past medical history significant for hypertension, dyslipidemia, asthma/allergies, GERD, Schatzki's ring and prior esophageal dilatation about 5 years ago.  She now presents with recent episodes of squeezing in the chest which feel episodic and spasmodic. She is concerned that this may be esophageal spasm however her primary care provider wanted to rule out possible coronary disease. She feels a tightness in her chest that is not necessarily associated with eating or present with exertion or relieved by rest. She is also short of breath, particularly when walking up hills. The tightness is been coming on more recently. She's also been under significant stress and does have some history of anxiety. She is caring for disabled son and has an 81 year old mother who she is caring for. 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 She reports that she does have some mild trouble swallowing and occasionally food does get stuck in her mid throat and often times pills are difficult to swallow and may come up. She also reported a brief presyncopal episode however this is with significantly rotating her head about 90 the cause that quick decrease in vision which improved when straightening her head out. She does have a history of neck injury in the past.  02/26/2016  Amber Brewer returns today for follow-up of her nuclear stress test. This is an exercise study in which no ischemia was noted. LVEF was greater than 65%. She did have a hypertensive response to exercise with a peak systolic blood pressure 123456. Blood pressure appears that is not as well-controlled as it should be. We discussed her chest discomfort which she says she is had some improvement in. Given her prior esophageal dilatation history and Schatzki's ring, she may be  having esophageal spasm. I asked her to follow-up with her gastroenterologist about this. She may need manometry. If this is the case then she may benefit from addition of low-dose calcium channel blocker. She does not want to add any extra medication at this time, however blood pressure is suboptimally controlled.  05/27/2016  Amber Brewer returns today for follow-up. She continues to have some anxiety episodes. This is associated with some throat tightness. She also has a history of esophageal stricture and Schatzki's ring. She has made an appointment with her gastroenterologist but that is not until September. It's unclear whether these episodes are GI related or anxiety. I recently increased her Bystolic to 7.5 mg daily. Her home blood pressure readings indicate good control.  11/08/2015  Amber Brewer was seen today as an add-on for feeling faint and having palpitations. An EKG in the office shows frequent unifocal PVCs. This is symptomatically similar to what she had when I saw her back in August however she is having more frequent episodes. She is also under significant stress. Her husband recently had intracranial hemorrhage and had emergency brain surgery. She also takes care of her elderly mother and disabled child. This causes a lot of stress in her life. She's had asthma however she is on inhaled steroid and rarely uses a beta agonist. She is on Bystolic however that has very poor ability to block PVCs. LV function was normal this past year in April 2017 by stress test without any ischemia, therefore not concerned about these being ischemic PVCs.  11/11/2016  Amber Brewer returns today for follow-up.  She reports symptomatic improvement in her palpitations on carvedilol. Blood pressure is low normal. She was getting a little dizzy and move her blood pressure medicines to the middle of the day. She reports about 50% improvement in her palpitations. She says she is breathing better and has more  energy.  PMHx:  Past Medical History:  Diagnosis Date  . Asthma   . Depression   . Gallstones   . GERD (gastroesophageal reflux disease)   . Hx of melanoma in situ 2002  . Hyperlipidemia   . Hypertension   . Hypothyroidism   . IBS (irritable bowel syndrome)   . Nephrolithiasis   . Osteoarthritis   . Status post dilation of esophageal narrowing     Past Surgical History:  Procedure Laterality Date  . ABDOMINAL HYSTERECTOMY  1974  . APPENDECTOMY  1977  . BLADDER SURGERY  1974  . BREAST LUMPECTOMY Right 2011  . CATARACT EXTRACTION Bilateral 2015  . CHOLECYSTECTOMY  1987  . removal of melanoma in situ      FAMHx:  Family History  Problem Relation Age of Onset  . Hypertension Mother   . Thyroid disease Mother   . Heart attack Father   . Heart disease Father   . Thyroid disease Sister   . Hypertension Maternal Aunt   . Hypertension Maternal Uncle   . Clotting disorder Maternal Uncle   . Prostate cancer Maternal Uncle   . Colon cancer Maternal Uncle   . Colon polyps Maternal Uncle   . Diabetes Maternal Uncle     all uncles  . Hypertension Maternal Grandmother   . Hyperlipidemia Maternal Grandfather   . Diabetes Maternal Grandfather   . Heart disease Maternal Grandfather   . Hyperlipidemia Paternal Grandmother     also MI  . Breast cancer Paternal Grandmother   . Hyperlipidemia Paternal Grandfather     also MI  . Heart disease Paternal Grandfather   . Hypertension Sister   . Thyroid disease Sister   . Hypertension Son   . Endometriosis Daughter     SOCHx:   reports that she has never smoked. She has never used smokeless tobacco. She reports that she does not drink alcohol or use drugs.  ALLERGIES:  Allergies  Allergen Reactions  . Codeine Phosphate Nausea And Vomiting    REACTION: unspecified  . Prednisone Shortness Of Breath  . Iodine Rash    Broke out on skin once years ago    ROS: Pertinent items noted in HPI and remainder of comprehensive ROS  otherwise negative.  HOME MEDS: Current Outpatient Prescriptions  Medication Sig Dispense Refill  . acyclovir (ZOVIRAX) 200 MG capsule Take 200 mg by mouth 2 (two) times a week.    . Biotin 10 MG TABS Take by mouth.      . budesonide (RHINOCORT ALLERGY) 32 MCG/ACT nasal spray Place 1 spray into both nostrils daily as needed.     . carvedilol (COREG) 12.5 MG tablet Take 1 tablet (12.5 mg total) by mouth 2 (two) times daily. 180 tablet 3  . cholecalciferol (VITAMIN D) 1000 UNITS tablet Take 5,000 Units by mouth daily.     . cycloSPORINE (RESTASIS) 0.05 % ophthalmic emulsion 1 drop as needed.      Marland Kitchen estradiol (VIVELLE-DOT) 0.05 MG/24HR patch Place 1 patch onto the skin 2 (two) times a week.    . Flaxseed, Linseed, (FLAX SEED OIL) 1000 MG CAPS Take by mouth as needed.     . Fluticasone Furoate (ARNUITY ELLIPTA) 100  MCG/ACT AEPB Inhale 1 Dose into the lungs daily. Rinse, gargle, and spit after use. 30 each 5  . hydrochlorothiazide 25 MG tablet TAKE 1 TABLET BY MOUTH ONCE DAILY FOR BLOOD PRESSURE 30 tablet 11  . irbesartan (AVAPRO) 300 MG tablet Take 300 mg by mouth at bedtime.      . montelukast (SINGULAIR) 10 MG tablet Take one tablet once daily 30 tablet 5  . Multiple Vitamins-Minerals (ZINC PO) Take by mouth daily.    . Omega-3 Fatty Acids (FISH OIL) 1000 MG CAPS Take by mouth.      Marland Kitchen omeprazole (PRILOSEC) 20 MG capsule     . potassium chloride (KAON-CL-10) 10 MEQ CR tablet Take 10 mEq by mouth daily.      Marland Kitchen PROAIR HFA 108 (90 Base) MCG/ACT inhaler as needed.    . ranitidine (ZANTAC) 300 MG tablet Take one tablet once daily 30 tablet 5  . SYNTHROID 50 MCG tablet     . levothyroxine (SYNTHROID) 50 MCG tablet Take 1 tablet (50 mcg total) by mouth daily. 30 tablet 11   Current Facility-Administered Medications  Medication Dose Route Frequency Provider Last Rate Last Dose  . TDaP (BOOSTRIX) injection 0.5 mL  0.5 mL Intramuscular Once Hoover Browns., MD        LABS/IMAGING: No  results found for this or any previous visit (from the past 48 hour(s)). No results found.  WEIGHTS: Wt Readings from Last 3 Encounters:  11/11/16 182 lb (82.6 kg)  10/31/16 182 lb 3.2 oz (82.6 kg)  07/31/16 182 lb (82.6 kg)    VITALS: BP 100/62 (BP Location: Right Arm, Patient Position: Sitting, Cuff Size: Normal)   Pulse 69   Ht 5\' 4"  (1.626 m)   Wt 182 lb (82.6 kg)   SpO2 96%   BMI 31.24 kg/m   EXAM: Deferred  EKG: Normal sinus rhythm at 69  ASSESSMENT: 1. Frequent symptomatic PVC's 2. Chest pain, more likely esophageal spasm - low risk exercise nuclear stress test (2017) 3. Progressive dyspnea on exertion - improved 4. Hypertension - controlled 5. Dyslipidemia  PLAN: 1.  Amber Brewer reports improvement in her PVCs on carvedilol. She still has some breakthrough at night. Will increase the p.m. dose to 25 mg and continue 12.5 mg in the morning. Decrease irbesartan to 150 mg daily continue current dose of hydrochlorothiazide. She'll need a recheck of blood pressure and symptoms in one month.  Pixie Casino, MD, [facility] Attending Cardiologist [place] [facility] 11/11/2016, 9:09 AM

## 2016-11-13 ENCOUNTER — Ambulatory Visit: Payer: PPO | Admitting: Allergy and Immunology

## 2016-12-10 DIAGNOSIS — M542 Cervicalgia: Secondary | ICD-10-CM | POA: Diagnosis not present

## 2016-12-10 DIAGNOSIS — R002 Palpitations: Secondary | ICD-10-CM | POA: Diagnosis not present

## 2016-12-10 DIAGNOSIS — I10 Essential (primary) hypertension: Secondary | ICD-10-CM | POA: Diagnosis not present

## 2016-12-10 DIAGNOSIS — E7800 Pure hypercholesterolemia, unspecified: Secondary | ICD-10-CM | POA: Diagnosis not present

## 2016-12-10 DIAGNOSIS — R7301 Impaired fasting glucose: Secondary | ICD-10-CM | POA: Diagnosis not present

## 2016-12-10 DIAGNOSIS — Z23 Encounter for immunization: Secondary | ICD-10-CM | POA: Diagnosis not present

## 2016-12-10 DIAGNOSIS — F411 Generalized anxiety disorder: Secondary | ICD-10-CM | POA: Diagnosis not present

## 2016-12-10 DIAGNOSIS — M8580 Other specified disorders of bone density and structure, unspecified site: Secondary | ICD-10-CM | POA: Diagnosis not present

## 2016-12-10 DIAGNOSIS — R51 Headache: Secondary | ICD-10-CM | POA: Diagnosis not present

## 2016-12-10 DIAGNOSIS — Z0000 Encounter for general adult medical examination without abnormal findings: Secondary | ICD-10-CM | POA: Diagnosis not present

## 2016-12-10 DIAGNOSIS — G4700 Insomnia, unspecified: Secondary | ICD-10-CM | POA: Diagnosis not present

## 2016-12-13 DIAGNOSIS — M542 Cervicalgia: Secondary | ICD-10-CM | POA: Diagnosis not present

## 2016-12-16 ENCOUNTER — Other Ambulatory Visit (HOSPITAL_COMMUNITY): Payer: Self-pay | Admitting: Orthopedic Surgery

## 2016-12-16 ENCOUNTER — Ambulatory Visit (HOSPITAL_COMMUNITY)
Admission: RE | Admit: 2016-12-16 | Discharge: 2016-12-16 | Disposition: A | Discharge status: Home / Self Care | Payer: PPO | Source: Ambulatory Visit | Attending: Orthopedic Surgery | Admitting: Orthopedic Surgery

## 2016-12-16 DIAGNOSIS — Z135 Encounter for screening for eye and ear disorders: Secondary | ICD-10-CM | POA: Diagnosis not present

## 2016-12-16 DIAGNOSIS — M542 Cervicalgia: Secondary | ICD-10-CM | POA: Diagnosis not present

## 2016-12-16 DIAGNOSIS — Z01818 Encounter for other preprocedural examination: Secondary | ICD-10-CM | POA: Diagnosis not present

## 2016-12-19 ENCOUNTER — Encounter: Payer: Self-pay | Admitting: Internal Medicine

## 2016-12-19 ENCOUNTER — Ambulatory Visit (INDEPENDENT_AMBULATORY_CARE_PROVIDER_SITE_OTHER): Payer: PPO | Admitting: Internal Medicine

## 2016-12-19 VITALS — BP 120/68 | HR 68 | Ht 63.5 in | Wt 180.2 lb

## 2016-12-19 DIAGNOSIS — R0602 Shortness of breath: Secondary | ICD-10-CM

## 2016-12-19 DIAGNOSIS — I10 Essential (primary) hypertension: Secondary | ICD-10-CM | POA: Diagnosis not present

## 2016-12-19 DIAGNOSIS — I493 Ventricular premature depolarization: Secondary | ICD-10-CM

## 2016-12-19 MED ORDER — CARVEDILOL 6.25 MG PO TABS: 6.2500 mg | ORAL_TABLET | Freq: Two times a day (BID) | ORAL | 3 refills | Status: DC

## 2016-12-19 NOTE — Patient Instructions (Signed)
DECREASE carvedilol to 6.25mg  twice daily  Your physician wants you to follow-up in: [place] with Dr. Debara Pickett. You will receive a reminder letter in the mail two months in advance. If you don't receive a letter, please call our office to schedule the follow-up appointment.

## 2016-12-19 NOTE — Progress Notes (Signed)
OFFICE NOTE  Chief Complaint:  Feeling well  Primary Care Physician: Gerrit Heck, MD  HPI:  Amber Brewer is a 66 y.o. female with a past medical history significant for hypertension, dyslipidemia, asthma/allergies, GERD, Schatzki's ring and prior esophageal dilatation about 5 years ago.  She now presents with recent episodes of squeezing in the chest which feel episodic and spasmodic. She is concerned that this may be esophageal spasm however her primary care provider wanted to rule out possible coronary disease. She feels a tightness in her chest that is not necessarily associated with eating or present with exertion or relieved by rest. She is also short of breath, particularly when walking up hills. The tightness is been coming on more recently. She's also been under significant stress and does have some history of anxiety. She is caring for disabled son and has an 74 year old mother who she is caring for. 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 She reports that she does have some mild trouble swallowing and occasionally food does get stuck in her mid throat and often times pills are difficult to swallow and may come up. She also reported a brief presyncopal episode however this is with significantly rotating her head about 90 the cause that quick decrease in vision which improved when straightening her head out. She does have a history of neck injury in the past.  02/26/2016  Amber Brewer returns today for follow-up of her nuclear stress test. This is an exercise study in which no ischemia was noted. LVEF was greater than 65%. She did have a hypertensive response to exercise with a peak systolic blood pressure 123456. Blood pressure appears that is not as well-controlled as it should be. We discussed her chest discomfort which she says she is had some improvement in. Given her prior esophageal dilatation history and Schatzki's ring, she may be having  esophageal spasm. I asked her to follow-up with her gastroenterologist about this. She may need manometry. If this is the case then she may benefit from addition of low-dose calcium channel blocker. She does not want to add any extra medication at this time, however blood pressure is suboptimally controlled.  05/27/2016  Amber Brewer returns today for follow-up. She continues to have some anxiety episodes. This is associated with some throat tightness. She also has a history of esophageal stricture and Schatzki's ring. She has made an appointment with her gastroenterologist but that is not until September. It's unclear whether these episodes are GI related or anxiety. I recently increased her Bystolic to 7.5 mg daily. Her home blood pressure readings indicate good control.  11/08/2015  Amber Brewer was seen today as an add-on for feeling faint and having palpitations. An EKG in the office shows frequent unifocal PVCs. This is symptomatically similar to what she had when I saw her back in August however she is having more frequent episodes. She is also under significant stress. Her husband recently had intracranial hemorrhage and had emergency brain surgery. She also takes care of her elderly mother and disabled child. This causes a lot of stress in her life. She's had asthma however she is on inhaled steroid and rarely uses a beta agonist. She is on Bystolic however that has very poor ability to block PVCs. LV function was normal this past year in April 2017 by stress test without any ischemia, therefore not concerned about these being ischemic PVCs.  11/11/2016  Amber Brewer returns today for follow-up. She  reports symptomatic improvement in her palpitations on carvedilol. Blood pressure is low normal. She was getting a little dizzy and move her blood pressure medicines to the middle of the day. She reports about 50% improvement in her palpitations. She says she is breathing better and has more  energy.  12/19/2016  Amber Brewer is seen today in follow-up. Overall she feels much better. She is actually made adjustments or other medication at home. She felt like a higher dose of carvedilol was robbing her of energy. Not quite as bad as being on diastolic but certainly worse. She therefore decreased the dose and currently is only taking 12.5 mg every morning. She subsequently increase the dose of her irbesartan up to 3 mg daily. Blood pressure control is been excellent with most readings between [id number] and 123456 systolic. She denies any worsening palpitations. She is generally pleased with how she feels on this current medication regimen. I did report to her that carvedilol is typically twice daily medicine because of the short half-life and we discussed the possibility of dividing the dose twice daily.  PMHx:  Past Medical History:  Diagnosis Date  . Asthma   . Depression   . Gallstones   . GERD (gastroesophageal reflux disease)   . Hx of melanoma in situ 2002  . Hyperlipidemia   . Hypertension   . Hypothyroidism   . IBS (irritable bowel syndrome)   . Nephrolithiasis   . Osteoarthritis   . Status post dilation of esophageal narrowing     Past Surgical History:  Procedure Laterality Date  . ABDOMINAL HYSTERECTOMY  1974  . APPENDECTOMY  1977  . BLADDER SURGERY  1974  . BREAST LUMPECTOMY Right 2011  . CATARACT EXTRACTION Bilateral 2015  . CHOLECYSTECTOMY  1987  . removal of melanoma in situ      FAMHx:  Family History  Problem Relation Age of Onset  . Hypertension Mother   . Thyroid disease Mother   . Heart attack Father   . Heart disease Father   . Thyroid disease Sister   . Hypertension Maternal Aunt   . Hypertension Maternal Uncle   . Clotting disorder Maternal Uncle   . Prostate cancer Maternal Uncle   . Colon cancer Maternal Uncle   . Colon polyps Maternal Uncle   . Diabetes Maternal Uncle     all uncles  . Hypertension Maternal Grandmother   . Hyperlipidemia  Maternal Grandfather   . Diabetes Maternal Grandfather   . Heart disease Maternal Grandfather   . Hyperlipidemia Paternal Grandmother     also MI  . Breast cancer Paternal Grandmother   . Hyperlipidemia Paternal Grandfather     also MI  . Heart disease Paternal Grandfather   . Hypertension Sister   . Thyroid disease Sister   . Hypertension Son   . Endometriosis Daughter     SOCHx:   reports that she has never smoked. She has never used smokeless tobacco. She reports that she does not drink alcohol or use drugs.  ALLERGIES:  Allergies  Allergen Reactions  . Codeine Phosphate Nausea And Vomiting    REACTION: unspecified  . Prednisone Shortness Of Breath  . Iodine Rash    Broke out on skin once years ago    ROS: Pertinent items noted in HPI and remainder of comprehensive ROS otherwise negative.  HOME MEDS: Current Outpatient Prescriptions  Medication Sig Dispense Refill  . acyclovir (ZOVIRAX) 200 MG capsule Take 200 mg by mouth 2 (two) times a week.    Marland Kitchen  Biotin 10 MG TABS Take by mouth.      . budesonide (RHINOCORT ALLERGY) 32 MCG/ACT nasal spray Place 1 spray into both nostrils daily as needed.     . cholecalciferol (VITAMIN D) 1000 UNITS tablet Take 5,000 Units by mouth daily.     . cycloSPORINE (RESTASIS) 0.05 % ophthalmic emulsion 1 drop as needed.      Marland Kitchen estradiol (VIVELLE-DOT) 0.05 MG/24HR patch Place 1 patch onto the skin 2 (two) times a week.    . Flaxseed, Linseed, (FLAX SEED OIL) 1000 MG CAPS Take by mouth as needed.     . Fluticasone Furoate (ARNUITY ELLIPTA) 100 MCG/ACT AEPB Inhale 1 Dose into the lungs daily. Rinse, gargle, and spit after use. 30 each 5  . hydrochlorothiazide 25 MG tablet TAKE 1 TABLET BY MOUTH ONCE DAILY FOR BLOOD PRESSURE 30 tablet 11  . irbesartan (AVAPRO) 300 MG tablet Take 300 mg by mouth daily.    . montelukast (SINGULAIR) 10 MG tablet Take one tablet once daily 30 tablet 5  . Multiple Vitamins-Minerals (ZINC PO) Take by mouth daily.    .  Omega-3 Fatty Acids (FISH OIL) 1000 MG CAPS Take by mouth.      Marland Kitchen omeprazole (PRILOSEC) 20 MG capsule     . potassium chloride (KAON-CL-10) 10 MEQ CR tablet Take 10 mEq by mouth daily.      Marland Kitchen PROAIR HFA 108 (90 Base) MCG/ACT inhaler as needed.    . ranitidine (ZANTAC) 300 MG tablet Take one tablet once daily 30 tablet 5  . carvedilol (COREG) 6.25 MG tablet Take 1 tablet (6.25 mg total) by mouth 2 (two) times daily. 180 tablet 3  . levothyroxine (SYNTHROID) 50 MCG tablet Take 1 tablet (50 mcg total) by mouth daily. 30 tablet 11   Current Facility-Administered Medications  Medication Dose Route Frequency Provider Last Rate Last Dose  . TDaP (BOOSTRIX) injection 0.5 mL  0.5 mL Intramuscular Once Hoover Browns., MD        LABS/IMAGING: No results found for this or any previous visit (from the past 48 hour(s)). No results found.  WEIGHTS: Wt Readings from Last 3 Encounters:  12/19/16 180 lb 3.2 oz (81.7 kg)  11/11/16 182 lb (82.6 kg)  10/31/16 182 lb 3.2 oz (82.6 kg)    VITALS: BP 120/68   Pulse 68   Ht 5' 3.5" (1.613 m)   Wt 180 lb 3.2 oz (81.7 kg)   BMI 31.42 kg/m   EXAM: General appearance: alert and no distress Neck: no carotid bruit and no JVD Lungs: clear to auscultation bilaterally Heart: regular rate and rhythm, S1, S2 normal, no murmur, click, rub or gallop Abdomen: soft, non-tender; bowel sounds normal; no masses,  no organomegaly Extremities: extremities normal, atraumatic, no cyanosis or edema Pulses: 2+ and symmetric Skin: Skin color, texture, turgor normal. No rashes or lesions Neurologic: Grossly normal Psych: Pleasant  EKG: Deferred  ASSESSMENT: 1. Frequent symptomatic PVC's -  improved 2. Chest pain, more likely esophageal spasm - low risk exercise nuclear stress test (2017) 3. Progressive dyspnea on exertion - improved 4. Hypertension - controlled 5. Dyslipidemia  PLAN: 1.  Amber Brewer has had a nice improvement in her symptoms after  adjusting some of her medications. I encouraged her to decrease her carvedilol 6.25 mg but take it twice daily, effectively the same dose for full day coverage. She's also continue on her increased dose irbesartan. Blood pressure is well-controlled. She is asymptomatic and again encouraged exercise and physical  activity which will be beneficial. Follow-up with me in 6 months.  Pixie Casino, MD, [facility] Attending Cardiologist [place] 12/19/2016, 1:31 PM

## 2016-12-23 DIAGNOSIS — M542 Cervicalgia: Secondary | ICD-10-CM | POA: Diagnosis not present

## 2016-12-25 ENCOUNTER — Ambulatory Visit (INDEPENDENT_AMBULATORY_CARE_PROVIDER_SITE_OTHER): Payer: PPO | Admitting: Allergy and Immunology

## 2016-12-25 VITALS — BP 128/70 | HR 72 | Resp 16

## 2016-12-25 DIAGNOSIS — J3089 Other allergic rhinitis: Secondary | ICD-10-CM

## 2016-12-25 DIAGNOSIS — J4530 Mild persistent asthma, uncomplicated: Secondary | ICD-10-CM | POA: Diagnosis not present

## 2016-12-25 DIAGNOSIS — K219 Gastro-esophageal reflux disease without esophagitis: Secondary | ICD-10-CM | POA: Diagnosis not present

## 2016-12-25 NOTE — Progress Notes (Signed)
Follow-up Note  Referring Provider: Leighton Ruff, MD Primary Provider: Gerrit Heck, MD Date of Office Visit: 12/25/2016  Subjective:   Amber Brewer (DOB: 10-17-1951) is a 66 y.o. female who returns to the [facility] on 12/25/2016 in re-evaluation of the following:  HPI: Amber Brewer returns to this clinic in reevaluation of her asthma and allergic rhinitis and LPR and history of chronic sinusitis. I have not seen her in this clinic since November 2017.  During the interval she has done wonderful and has not required a systemic steroid to treat an exacerbation of her asthma and rarely uses a short acting bronchodilator and can exert herself without any problem. During her last visit we removed her combination inhaler and had her use just an inhaled steroid. She has also been taken off bystolic by her cardiologist and she thinks that this is also helped her breathing.  She's had very little issues with her nose. She has not required an antibiotic to treat an episode of sinusitis.  Her throat is doing very well at this point in time. She may have some slight throat clearing and possibly a small cough in the morning and some postnasal drip but overall has had a dramatic improvement regarding all the issues involving her throat. She is now caffeine free and does not really consume chocolate or alcohol.  She did obtain the flu vaccine this year and has also received Prevnar.  Allergies as of 12/25/2016      Reactions   Codeine Phosphate Nausea And Vomiting   REACTION: unspecified   Prednisone Shortness Of Breath   Iodine Rash   Broke out on skin once years ago      Medication List      acyclovir 200 MG capsule Commonly known as:  ZOVIRAX Take 200 mg by mouth 2 (two) times a week.   Biotin 10 MG Tabs Take by mouth.   carvedilol 6.25 MG tablet Commonly known as:  COREG Take 1 tablet (6.25 mg total) by mouth 2 (two) times daily.   cholecalciferol  1000 units tablet Commonly known as:  VITAMIN D Take 5,000 Units by mouth daily.   cycloSPORINE 0.05 % ophthalmic emulsion Commonly known as:  RESTASIS 1 drop as needed.   estradiol 0.05 MG/24HR patch Commonly known as:  VIVELLE-DOT Place 1 patch onto the skin 2 (two) times a week.   Fish Oil 1000 MG Caps Take by mouth.   Flax Seed Oil 1000 MG Caps Take by mouth as needed.   Fluticasone Furoate 100 MCG/ACT Aepb Commonly known as:  ARNUITY ELLIPTA Inhale 1 Dose into the lungs daily. Rinse, gargle, and spit after use.   hydrochlorothiazide 25 MG tablet Commonly known as:  HYDRODIURIL TAKE 1 TABLET BY MOUTH ONCE DAILY FOR BLOOD PRESSURE   irbesartan 300 MG tablet Commonly known as:  AVAPRO Take 300 mg by mouth daily.   KAON-CL-10 10 MEQ CR tablet Generic drug:  potassium chloride Take 10 mEq by mouth daily.   levothyroxine 50 MCG tablet Commonly known as:  SYNTHROID Take 1 tablet (50 mcg total) by mouth daily.   montelukast 10 MG tablet Commonly known as:  SINGULAIR Take one tablet once daily   omeprazole 20 MG capsule Commonly known as:  PRILOSEC   PROAIR HFA 108 (90 Base) MCG/ACT inhaler Generic drug:  albuterol as needed.   ranitidine 300 MG tablet Commonly known as:  ZANTAC Take one tablet once daily   RHINOCORT ALLERGY 32 MCG/ACT nasal  spray Generic drug:  budesonide Place 1 spray into both nostrils daily as needed.   ZINC PO Take by mouth daily.       Past Medical History:  Diagnosis Date  . Asthma   . Depression   . Gallstones   . GERD (gastroesophageal reflux disease)   . Hx of melanoma in situ 2002  . Hyperlipidemia   . Hypertension   . Hypothyroidism   . IBS (irritable bowel syndrome)   . Nephrolithiasis   . Osteoarthritis   . Status post dilation of esophageal narrowing     Past Surgical History:  Procedure Laterality Date  . ABDOMINAL HYSTERECTOMY  1974  . APPENDECTOMY  1977  . BLADDER SURGERY  1974  . BREAST LUMPECTOMY  Right 2011  . CATARACT EXTRACTION Bilateral 2015  . CHOLECYSTECTOMY  1987  . removal of melanoma in situ      Review of systems negative except as noted in HPI / PMHx or noted below:  Review of Systems  Constitutional: Negative.   HENT: Negative.   Eyes: Negative.   Respiratory: Negative.   Cardiovascular: Negative.   Gastrointestinal: Negative.   Genitourinary: Negative.   Musculoskeletal: Negative.   Skin: Negative.   Neurological: Negative.   Endo/Heme/Allergies: Negative.   Psychiatric/Behavioral: Negative.      Objective:   Vitals:   12/25/16 1032  BP: 128/70  Pulse: 72  Resp: 16          Physical Exam  Constitutional: She is well-developed, well-nourished, and in no distress.  HENT:  Head: Normocephalic.  Right Ear: Tympanic membrane, external ear and ear canal normal.  Left Ear: Tympanic membrane, external ear and ear canal normal.  Nose: Nose normal. No mucosal edema or rhinorrhea.  Mouth/Throat: Uvula is midline, oropharynx is clear and moist and mucous membranes are normal. No oropharyngeal exudate.  Eyes: Conjunctivae are normal.  Neck: Trachea normal. No tracheal tenderness present. No tracheal deviation present. No thyromegaly present.  Cardiovascular: Normal rate, regular rhythm, S1 normal, S2 normal and normal heart sounds.   No murmur heard. Pulmonary/Chest: Breath sounds normal. No stridor. No respiratory distress. She has no wheezes. She has no rales.  Musculoskeletal: She exhibits no edema.  Lymphadenopathy:       Head (right side): No tonsillar adenopathy present.       Head (left side): No tonsillar adenopathy present.    She has no cervical adenopathy.  Neurological: She is alert. Gait normal.  Skin: No rash noted. She is not diaphoretic. No erythema. Nails show no clubbing.  Psychiatric: Mood and affect normal.    Diagnostics:    Spirometry was performed and demonstrated an FEV1 of 1.60 at 69 % of predicted.    Assessment and Plan:     1. Asthma, well controlled, mild persistent   2. Other allergic rhinitis   3. LPRD (laryngopharyngeal reflux disease)      1. Continue to Treat inflammation:   A.  Arnuity 100 one inhalation one time per day.  B. OTC Rhinocort one spray each nostril one time per day  C. montelukast 10 mg one tablet one time per day  2. Continue to Treat reflux:   A. Continue to minimize caffeine and chocolate    B. omeprazole 20 mg in a.m.  C. ranitidine 300 mg in PM  3. If needed:   A. nasal saline wash  B. ProAir HFA 2 puffs every 4-6 hours  C. OTC antihistamine - Claritin/Allegra/Zyrtec  D. OTC Mucinex DM 2 tablets  twice a day  5. Return to clinic in Summer 2018 or earlier if problem  Salote has really done very well on her current plan and we will continue to have her use Arnuity and Rhinocort and montelukast as anti-inflammatory therapy for her respiratory tract as she goes to the spring. In addition, she will remain on aggressive therapy directed against LPR as noted above. If she does well over the course of the next several months I will see her back in this clinic in the summer and will make an attempt to consolidate her treatment with that visit.  Allena Katz, MD Allergy / Immunology [place]

## 2016-12-25 NOTE — Patient Instructions (Signed)
    1. Continue to Treat inflammation:   A.  Arnuity 100 one inhalation one time per day.  B. OTC Rhinocort one spray each nostril one time per day  C. montelukast 10 mg one tablet one time per day  2. Continue to Treat reflux:   A. Continue to minimize caffeine and chocolate    B. omeprazole 20 mg in a.m.  C. ranitidine 300 mg in PM  3. If needed:   A. nasal saline wash  B. ProAir HFA 2 puffs every 4-6 hours  C. OTC antihistamine - Claritin/Allegra/Zyrtec  D. OTC Mucinex DM 2 tablets twice a day  5. Return to clinic in Summer 2018 or earlier if problem

## 2016-12-26 ENCOUNTER — Encounter: Payer: Self-pay | Admitting: Allergy and Immunology

## 2016-12-31 DIAGNOSIS — M542 Cervicalgia: Secondary | ICD-10-CM | POA: Diagnosis not present

## 2017-01-07 DIAGNOSIS — I10 Essential (primary) hypertension: Secondary | ICD-10-CM | POA: Diagnosis not present

## 2017-01-07 DIAGNOSIS — E7800 Pure hypercholesterolemia, unspecified: Secondary | ICD-10-CM | POA: Diagnosis not present

## 2017-01-07 DIAGNOSIS — R7301 Impaired fasting glucose: Secondary | ICD-10-CM | POA: Diagnosis not present

## 2017-01-07 DIAGNOSIS — E039 Hypothyroidism, unspecified: Secondary | ICD-10-CM | POA: Diagnosis not present
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
## 2017-01-17 ENCOUNTER — Telehealth: Payer: Self-pay | Admitting: Neurology

## 2017-01-17 ENCOUNTER — Encounter: Payer: Self-pay | Admitting: Neurology

## 2017-01-17 ENCOUNTER — Ambulatory Visit (INDEPENDENT_AMBULATORY_CARE_PROVIDER_SITE_OTHER): Payer: PPO | Admitting: Neurology

## 2017-01-17 VITALS — BP 133/70 | HR 66 | Ht 63.0 in | Wt 187.0 lb

## 2017-01-17 DIAGNOSIS — R42 Dizziness and giddiness: Secondary | ICD-10-CM | POA: Diagnosis not present

## 2017-01-17 DIAGNOSIS — R2689 Other abnormalities of gait and mobility: Secondary | ICD-10-CM

## 2017-01-17 DIAGNOSIS — M542 Cervicalgia: Secondary | ICD-10-CM

## 2017-01-17 DIAGNOSIS — R51 Headache with orthostatic component, not elsewhere classified: Secondary | ICD-10-CM

## 2017-01-17 DIAGNOSIS — R519 Headache, unspecified: Secondary | ICD-10-CM

## 2017-01-17 NOTE — Patient Instructions (Addendum)
Remember to drink plenty of fluid, eat healthy meals and do not skip any meals. Try to eat protein with a every meal and eat a healthy snack such as fruit or nuts in between meals. Try to keep a regular sleep-wake schedule and try to exercise daily, particularly in the form of walking, 20-30 minutes a day, if you can.   As far as diagnostic testing: Physical Therapy, MRI brain and blood vessels  I would like to see you back in 3 months, sooner if we need to. Please call us with any interim questions, concerns, problems, updates or refill requests.   Our phone number is [phone]. We also have an after hours call service for urgent matters and there is a physician on-call for urgent questions. For any emergencies you know to call 911 or go to the nearest emergency room

## 2017-01-17 NOTE — Telephone Encounter (Signed)
Hey Dr. Jaynee Eagles, I saw that you had ordered some bloodwork, but pt wasn't sure if she needed to since her PCP had her do that on 3/20. Would you still like her to?

## 2017-01-17 NOTE — Telephone Encounter (Signed)
Delsa Sale would u call on Tuesday? thanks

## 2017-01-17 NOTE — Telephone Encounter (Signed)
Left vm for patient that if she had lab work done at PCP, Dr. Jaynee Eagles needs a copy fax to [phone]. Rn tried to call PCP office but they were close. Rn requested pt call back on Monday.

## 2017-01-20 ENCOUNTER — Other Ambulatory Visit: Payer: Self-pay | Admitting: Allergy and Immunology

## 2017-01-20 NOTE — Telephone Encounter (Signed)
Patient called office states she has spoken to  PCP and they have faxed lab results to our office.  Pt also has a question about a medication in her office visit that she is no longer taking.

## 2017-01-21 NOTE — Telephone Encounter (Signed)
Received faxed lab results from [facility]. CMP wnl except glucose 105 (H). Placed in Dr. Cathren Laine box for review. Copy to medical records to scan into pt's chart.

## 2017-01-21 NOTE — Addendum Note (Signed)
Addended by: Monte Fantasia on: 01/21/2017 12:40 PM   Modules accepted: Orders

## 2017-01-21 NOTE — Telephone Encounter (Signed)
Called pt to let her know that we did receive labs and that kidney function was wnl for upcoming MRI w w/o contrast. Verbalized understanding. Also reported that she is not longer taking Bystolic. Med list updated. Pt voiced appreciation for call.

## 2017-02-02 ENCOUNTER — Other Ambulatory Visit: Payer: Self-pay | Admitting: Allergy and Immunology

## 2017-02-03 ENCOUNTER — Ambulatory Visit
Admission: RE | Admit: 2017-02-03 | Discharge: 2017-02-03 | Disposition: A | Discharge status: Home / Self Care | Payer: PPO | Source: Ambulatory Visit | Attending: Neurology | Admitting: Neurology

## 2017-02-03 DIAGNOSIS — R519 Headache, unspecified: Secondary | ICD-10-CM

## 2017-02-03 DIAGNOSIS — R51 Headache with orthostatic component, not elsewhere classified: Secondary | ICD-10-CM

## 2017-02-03 DIAGNOSIS — R2689 Other abnormalities of gait and mobility: Secondary | ICD-10-CM | POA: Diagnosis not present

## 2017-02-03 DIAGNOSIS — M542 Cervicalgia: Secondary | ICD-10-CM

## 2017-02-03 DIAGNOSIS — R42 Dizziness and giddiness: Secondary | ICD-10-CM | POA: Diagnosis not present

## 2017-02-03 MED ORDER — GADOBENATE DIMEGLUMINE 529 MG/ML IV SOLN
17.0000 mL | Freq: Once | INTRAVENOUS | Status: AC | PRN
  Administered 2017-02-03: 17 mL via INTRAVENOUS

## 2017-02-06 ENCOUNTER — Other Ambulatory Visit: Payer: Self-pay | Admitting: Neurology

## 2017-02-06 ENCOUNTER — Telehealth: Payer: Self-pay

## 2017-02-06 DIAGNOSIS — R51 Headache with orthostatic component, not elsewhere classified: Secondary | ICD-10-CM

## 2017-02-06 DIAGNOSIS — R42 Dizziness and giddiness: Secondary | ICD-10-CM

## 2017-02-06 DIAGNOSIS — R519 Headache, unspecified: Secondary | ICD-10-CM

## 2017-02-06 DIAGNOSIS — G450 Vertebro-basilar artery syndrome: Secondary | ICD-10-CM

## 2017-02-06 NOTE — Telephone Encounter (Signed)
-----   Message from Melvenia Beam, MD sent at 02/05/2017  5:39 PM EDT ----- MRI brain unremarkable. MRA of the head showed a small1.5 mm outpouching in one of the arteries. This could represent an expanded arterial infundibulum or a small aneurysm. Follow-up imaging in a year is recommended but nothing concerning as of now. thanks

## 2017-02-06 NOTE — Telephone Encounter (Signed)
Orders entered per MD.

## 2017-02-06 NOTE — Telephone Encounter (Signed)
I ordered an MRA of the head thanks

## 2017-02-06 NOTE — Telephone Encounter (Signed)
Oh you are right she already had an MRA of the head. Please cancel it. Then I wonder what she is talking about, let me take a look.

## 2017-02-06 NOTE — Addendum Note (Signed)
Addended by: Monte Fantasia on: 02/06/2017 11:10 AM   Modules accepted: Orders

## 2017-02-06 NOTE — Telephone Encounter (Signed)
Yes please order carotid dopplers Delsa Sale, thanks

## 2017-02-06 NOTE — Addendum Note (Signed)
Addended by: Monte Fantasia on: 02/06/2017 11:29 AM   Modules accepted: Orders

## 2017-02-06 NOTE — Telephone Encounter (Signed)
Called pt w/ results. Verbalized understanding and appreciation for call.  Says that she forgot to mention that when she bends over, for example, to pick something up, she feels that the "blood drains" and then has pain under her left ear around the mastoid bone for 1-2 days. Reports that this only occurs every couple of months. She continues to have dizziness/lightheadedness when her head is back. Says that she discussed w/ Dr. Jaynee Eagles the possibility of checking the blood flow in her neck.

## 2017-02-10 DIAGNOSIS — D351 Benign neoplasm of parathyroid gland: Secondary | ICD-10-CM | POA: Diagnosis not present

## 2017-02-10 DIAGNOSIS — Z808 Family history of malignant neoplasm of other organs or systems: Secondary | ICD-10-CM | POA: Diagnosis not present

## 2017-02-10 DIAGNOSIS — D485 Neoplasm of uncertain behavior of skin: Secondary | ICD-10-CM | POA: Diagnosis not present

## 2017-02-10 DIAGNOSIS — Z86018 Personal history of other benign neoplasm: Secondary | ICD-10-CM | POA: Diagnosis not present

## 2017-02-10 DIAGNOSIS — D1800 Hemangioma unspecified site: Secondary | ICD-10-CM | POA: Diagnosis not present

## 2017-02-10 DIAGNOSIS — D1801 Hemangioma of skin and subcutaneous tissue: Secondary | ICD-10-CM | POA: Diagnosis not present

## 2017-02-10 DIAGNOSIS — Z85828 Personal history of other malignant neoplasm of skin: Secondary | ICD-10-CM | POA: Diagnosis not present

## 2017-02-10 DIAGNOSIS — L820 Inflamed seborrheic keratosis: Secondary | ICD-10-CM | POA: Diagnosis not present

## 2017-02-10 DIAGNOSIS — L821 Other seborrheic keratosis: Secondary | ICD-10-CM | POA: Diagnosis not present

## 2017-02-10 DIAGNOSIS — Z87898 Personal history of other specified conditions: Secondary | ICD-10-CM | POA: Diagnosis not present

## 2017-02-10 DIAGNOSIS — L814 Other melanin hyperpigmentation: Secondary | ICD-10-CM | POA: Diagnosis not present

## 2017-02-10 DIAGNOSIS — D2271 Melanocytic nevi of right lower limb, including hip: Secondary | ICD-10-CM | POA: Diagnosis not present

## 2017-02-10 DIAGNOSIS — L719 Rosacea, unspecified: Secondary | ICD-10-CM | POA: Diagnosis not present

## 2017-02-10 DIAGNOSIS — B351 Tinea unguium: Secondary | ICD-10-CM | POA: Diagnosis not present

## 2017-02-19 ENCOUNTER — Ambulatory Visit (HOSPITAL_COMMUNITY)
Admission: RE | Admit: 2017-02-19 | Discharge: 2017-02-19 | Disposition: A | Discharge status: Home / Self Care | Payer: PPO | Source: Ambulatory Visit | Attending: Neurology | Admitting: Neurology

## 2017-02-19 DIAGNOSIS — R42 Dizziness and giddiness: Secondary | ICD-10-CM

## 2017-02-19 DIAGNOSIS — R51 Headache with orthostatic component, not elsewhere classified: Secondary | ICD-10-CM

## 2017-02-19 DIAGNOSIS — G450 Vertebro-basilar artery syndrome: Secondary | ICD-10-CM | POA: Diagnosis not present

## 2017-02-19 LAB — VAS US CAROTID
LCCAPDIAS: 24 cm/s
LCCAPSYS: 110 cm/s
LEFT ECA DIAS: -6 cm/s
LEFT VERTEBRAL DIAS: -7 cm/s
LICADDIAS: -51 cm/s
LICADSYS: -121 cm/s
LICAPSYS: -71 cm/s
Left CCA dist dias: -24 cm/s
Left CCA dist sys: -83 cm/s
Left ICA prox dias: -27 cm/s
RCCADSYS: -82 cm/s
RIGHT ECA DIAS: -10 cm/s
RIGHT VERTEBRAL DIAS: -17 cm/s
Right CCA prox dias: -12 cm/s
Right CCA prox sys: -80 cm/s

## 2017-02-19 NOTE — Progress Notes (Signed)
**  Preliminary report by tech**  Carotid artery duplex complete. Findings are consistent with a 1-39 percent stenosis involving the right internal carotid artery and the left internal carotid artery. The vertebral arteries demonstrate antegrade flow.  02/19/17 10:14 AM Amber Brewer RVT

## 2017-02-21 ENCOUNTER — Telehealth: Payer: Self-pay

## 2017-02-21 NOTE — Telephone Encounter (Signed)
-----   Message from Melvenia Beam, MD sent at 02/21/2017 10:33 AM EDT ----- No signficant stenosis or cause for symptoms thanks

## 2017-02-21 NOTE — Telephone Encounter (Signed)
Called pt w/ results. Verbalized understanding and appreciation for call.

## 2017-02-24 ENCOUNTER — Other Ambulatory Visit: Payer: Self-pay | Admitting: Allergy and Immunology

## 2017-04-18 ENCOUNTER — Ambulatory Visit: Payer: PPO | Attending: Neurology | Admitting: Physical Therapy

## 2017-04-18 ENCOUNTER — Encounter: Payer: Self-pay | Admitting: Physical Therapy

## 2017-04-18 DIAGNOSIS — R2681 Unsteadiness on feet: Secondary | ICD-10-CM | POA: Diagnosis not present

## 2017-04-18 DIAGNOSIS — M542 Cervicalgia: Secondary | ICD-10-CM

## 2017-04-18 DIAGNOSIS — R262 Difficulty in walking, not elsewhere classified: Secondary | ICD-10-CM | POA: Insufficient documentation

## 2017-04-18 DIAGNOSIS — R42 Dizziness and giddiness: Secondary | ICD-10-CM | POA: Diagnosis not present
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
## 2017-04-21 ENCOUNTER — Ambulatory Visit: Payer: PPO | Admitting: Neurology

## 2017-04-25 ENCOUNTER — Encounter: Payer: Self-pay | Admitting: Rehabilitation

## 2017-04-25 ENCOUNTER — Other Ambulatory Visit: Payer: Self-pay | Admitting: Allergy and Immunology

## 2017-04-25 ENCOUNTER — Ambulatory Visit: Payer: PPO | Attending: Neurology | Admitting: Rehabilitation

## 2017-04-25 DIAGNOSIS — M542 Cervicalgia: Secondary | ICD-10-CM | POA: Insufficient documentation

## 2017-04-25 DIAGNOSIS — R262 Difficulty in walking, not elsewhere classified: Secondary | ICD-10-CM | POA: Insufficient documentation

## 2017-04-25 DIAGNOSIS — R2681 Unsteadiness on feet: Secondary | ICD-10-CM | POA: Insufficient documentation

## 2017-04-25 DIAGNOSIS — R42 Dizziness and giddiness: Secondary | ICD-10-CM | POA: Insufficient documentation

## 2017-04-25 NOTE — Patient Instructions (Signed)
   Make small roll in towel and hook it under bones at base of your skull.  Pull in and slightly up and while doing this look up as far as you can comfortably and hold for 3-5 seconds.  Repeat in this same manner looking down and hold for 3-5 seconds.  Repeat for 5 reps each direction.     SNAG  Using a towel draped around your neck, begin with one hand stabilizing one end of towel with light downward pull. Next, turn your head as far to one direction as able (as in picture). Use opposite hand to gently pull towel across your face making sure not to pull across your jaw to allow for light stretch into rotational direction. Hold each direction for 3-5 seconds and repeat x 5 reps each direction.    Do these 2-3 times per day.
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
## 2017-05-05 ENCOUNTER — Ambulatory Visit: Payer: PPO | Admitting: Rehabilitative and Restorative Service Providers"

## 2017-05-05 DIAGNOSIS — M542 Cervicalgia: Secondary | ICD-10-CM

## 2017-05-05 DIAGNOSIS — R2681 Unsteadiness on feet: Secondary | ICD-10-CM

## 2017-05-05 DIAGNOSIS — R42 Dizziness and giddiness: Secondary | ICD-10-CM

## 2017-05-05 DIAGNOSIS — R262 Difficulty in walking, not elsewhere classified: Secondary | ICD-10-CM

## 2017-05-05 NOTE — Patient Instructions (Addendum)
Rehabilitation      Make small roll in towel and hook it under bones at base of your skull.  Pull in and slightly up and while doing this look up as far as you can comfortably and hold for 3-5 seconds.  Repeat in this same manner looking down and hold for 3-5 seconds.  Repeat for 5 reps each direction.     SNAG  Using a towel draped around your neck, begin with one hand stabilizing one end of towel with light downward pull. Next, turn your head as far to one direction as able (as in picture). Use opposite hand to gently pull towel across your face making sure not to pull across your jaw to allow for light stretch into rotational direction. Hold each direction for 3-5 seconds and repeat x 5 reps each direction.    Do these 2-3 times per day.                     Gaze Stabilization - Tip Card  1.Target must remain in focus, not blurry, and appear stationary while head is in motion. 2.Perform exercises with small head movements (45 to either side of midline). 3.Increase speed of head motion so long as target is in focus. 4.If you wear eyeglasses, be sure you can see target through lens (therapist will give specific instructions for bifocal / progressive lenses). 5.These exercises may provoke dizziness or nausea. Work through these symptoms. If too dizzy, slow head movement slightly. Rest between each exercise. 6.Exercises demand concentration; avoid distractions. 7.For safety, perform standing exercises close to a counter, wall, corner, or next to someone.  Copyright  VHI. All rights reserved.   Gaze Stabilization - Standing Feet Apart   Feet shoulder width apart, keeping eyes on target on wall 3 feet away, tilt head down slightly and move head side to side for 20 seconds. Repeat while moving head up and down for 20 seconds. Do 2-3 sessions per day.   Copyright  VHI. All rights reserved.  Feet Apart, Varied Arm Positions - Eyes Closed    Stand with  feet shoulder width apart and arms at your side. Close eyes and visualize upright position. Hold __30__ seconds. Repeat __3__ times per session. Do __2__ sessions per day.  Copyright  VHI. All rights reserved.
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
## 2017-05-06 ENCOUNTER — Ambulatory Visit: Payer: PPO | Admitting: Neurology

## 2017-05-06 ENCOUNTER — Emergency Department (HOSPITAL_COMMUNITY)
Admission: EM | Admit: 2017-05-06 | Discharge: 2017-05-06 | Disposition: A | Discharge status: Home / Self Care | Payer: PPO | Attending: Emergency Medicine | Admitting: Emergency Medicine

## 2017-05-06 ENCOUNTER — Encounter (HOSPITAL_COMMUNITY): Payer: Self-pay | Admitting: Emergency Medicine

## 2017-05-06 DIAGNOSIS — R339 Retention of urine, unspecified: Secondary | ICD-10-CM | POA: Insufficient documentation

## 2017-05-06 DIAGNOSIS — Z79899 Other long term (current) drug therapy: Secondary | ICD-10-CM | POA: Diagnosis not present

## 2017-05-06 DIAGNOSIS — J45909 Unspecified asthma, uncomplicated: Secondary | ICD-10-CM | POA: Insufficient documentation

## 2017-05-06 DIAGNOSIS — R3129 Other microscopic hematuria: Secondary | ICD-10-CM | POA: Diagnosis not present

## 2017-05-06 DIAGNOSIS — N23 Unspecified renal colic: Secondary | ICD-10-CM | POA: Diagnosis not present

## 2017-05-06 DIAGNOSIS — I10 Essential (primary) hypertension: Secondary | ICD-10-CM | POA: Insufficient documentation

## 2017-05-06 DIAGNOSIS — Z87442 Personal history of urinary calculi: Secondary | ICD-10-CM | POA: Diagnosis not present

## 2017-05-06 DIAGNOSIS — N200 Calculus of kidney: Secondary | ICD-10-CM | POA: Diagnosis not present

## 2017-05-06 DIAGNOSIS — R1032 Left lower quadrant pain: Secondary | ICD-10-CM | POA: Diagnosis present

## 2017-05-06 LAB — URINALYSIS, ROUTINE W REFLEX MICROSCOPIC
BILIRUBIN URINE: NEGATIVE
Glucose, UA: NEGATIVE mg/dL
Ketones, ur: NEGATIVE mg/dL
Leukocytes, UA: NEGATIVE
NITRITE: NEGATIVE
PH: 7 (ref 5.0–8.0)
Protein, ur: NEGATIVE mg/dL
SPECIFIC GRAVITY, URINE: 1.015 (ref 1.005–1.030)

## 2017-05-06 LAB — I-STAT CHEM 8, ED
BUN: 20 mg/dL (ref 6–20)
CALCIUM ION: 1 mmol/L — AB (ref 1.15–1.40)
CREATININE: 0.9 mg/dL (ref 0.44–1.00)
Chloride: 106 mmol/L (ref 101–111)
Glucose, Bld: 123 mg/dL — ABNORMAL HIGH (ref 65–99)
HCT: 43 % (ref 36.0–46.0)
Hemoglobin: 14.6 g/dL (ref 12.0–15.0)
Potassium: 4 mmol/L (ref 3.5–5.1)
Sodium: 139 mmol/L (ref 135–145)
TCO2: 24 mmol/L (ref 0–100)

## 2017-05-06 MED ORDER — KETOROLAC TROMETHAMINE 30 MG/ML IJ SOLN
30.0000 mg | Freq: Once | INTRAMUSCULAR | Status: AC
  Administered 2017-05-06: 30 mg via INTRAMUSCULAR
  Filled 2017-05-06: qty 1

## 2017-05-06 MED ORDER — KETOROLAC TROMETHAMINE 15 MG/ML IJ SOLN
15.0000 mg | Freq: Once | INTRAMUSCULAR | Status: DC
Start: 2017-05-06 — End: 2017-05-06

## 2017-05-06 MED ORDER — MORPHINE SULFATE (PF) 4 MG/ML IV SOLN: 4.0000 mg | Freq: Once | INTRAVENOUS | Status: DC

## 2017-05-06 NOTE — ED Provider Notes (Signed)
[facility] DEPT Provider Note   CSN: [id number] Arrival date & time: 05/06/17  1845     History   Chief Complaint Chief Complaint  Patient presents with  . Flank Pain  . Urinary Retention    HPI Amber Brewer is a 66 y.o. female.  The history is provided by the patient.  Flank Pain  This is a new problem. The current episode started 1 to 2 hours ago. The problem occurs constantly. The problem has not changed since onset.Pertinent negatives include no chest pain, no abdominal pain and no shortness of breath. Nothing aggravates the symptoms. Nothing relieves the symptoms. She has tried nothing for the symptoms.   Similar to her prior kidney stone pain. Endorses urinary frequency but no dysuria.   Past Medical History:  Diagnosis Date  . Asthma   . Depression   . Gallstones   . GERD (gastroesophageal reflux disease)   . Hx of melanoma in situ 2002  . Hyperlipidemia   . Hypertension   . Hypothyroidism   . IBS (irritable bowel syndrome)   . Nephrolithiasis   . Osteoarthritis   . Status post dilation of esophageal narrowing     Patient Active Problem List   Diagnosis Date Noted  . Symptomatic PVCs 10/31/2016  . Benign hypertension 10/31/2016  . Anxiety 10/31/2016  . Hyperlipidemia 05/27/2016  . LEG PAIN, BILATERAL 06/07/2010  . LEG EDEMA, BILATERAL 10/04/2009  . HYPOKALEMIA 07/20/2009  . ALLERGY, HX OF 11/24/2008  . WHEEZING 06/29/2008  . ANXIETY, SITUATIONAL 05/19/2008  . 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 SHORTNESS OF BREATH 05/19/2008  . Pain in the chest 05/19/2008  . 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 HYPOTHYROIDISM, BORDERLINE 12/16/2007  . PANIC ATTACK 06/04/2007  . DISORDER, 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  . Essential hypertension 05/25/2007  . OSTEOARTHRITIS 05/25/2007  . NEPHROLITHIASIS, HX OF 05/25/2007  . ARTHRITIS, HX OF 05/25/2007    Past Surgical History:  Procedure Laterality Date  . ABDOMINAL HYSTERECTOMY  1974  . APPENDECTOMY   1977  . BLADDER SURGERY  1974  . BREAST LUMPECTOMY Right 2011  . CATARACT EXTRACTION Bilateral 2015  . CHOLECYSTECTOMY  1987  . removal of melanoma in situ      OB History    No data available       Home Medications    Prior to Admission medications   Medication Sig Start Date End Date Taking? Authorizing Provider  acyclovir (ZOVIRAX) 200 MG capsule Take 200 mg by mouth 2 (two) times a week.   Yes [provider]  ARNUITY ELLIPTA 100 MCG/ACT AEPB INHALE ONE PUFF INTO LUNGS ONCE DAILY (RINSE,  GARGLE,  AND  SPIT  AFTER  USE) 02/24/17  Yes Kozlow, Donnamarie Poag, MD  Biotin 10 MG TABS Take 1 tablet by mouth daily.    Yes [provider]  carvedilol (COREG) 6.25 MG tablet Take 1 tablet (6.25 mg total) by mouth 2 (two) times daily. 12/19/16  Yes Hilty, Nadean Corwin, MD  cholecalciferol (VITAMIN D) 1000 UNITS tablet Take 5,000 Units by mouth daily.    Yes [provider]  cycloSPORINE (RESTASIS) 0.05 % ophthalmic emulsion Place 1 drop into both eyes every morning.    Yes [provider]  Flaxseed, Linseed, (FLAX SEED OIL) 1000 MG CAPS Take 1 capsule by mouth daily.    Yes [provider]  hydrochlorothiazide 25 MG tablet TAKE 1 TABLET BY MOUTH ONCE DAILY FOR BLOOD PRESSURE 02/12/11  Yes Hoover Browns., MD  irbesartan (  AVAPRO) 300 MG tablet Take 300 mg by mouth daily.   Yes [provider]  montelukast (SINGULAIR) 10 MG tablet TAKE 1 TABLET BY MOUTH ONCE DAILY 04/28/17  Yes Kozlow, Donnamarie Poag, MD  Multiple Vitamins-Minerals (ZINC PO) Take 1 tablet by mouth daily.    Yes [provider]  Omega-3 Fatty Acids (FISH OIL) 1000 MG CAPS Take 1 capsule by mouth daily.    Yes [provider]  omeprazole (PRILOSEC) 20 MG capsule Take 20 mg by mouth daily.  07/12/16  Yes [provider]  potassium chloride (KAON-CL-10) 10 MEQ CR tablet Take 10 mEq by mouth daily.     Yes [provider]  ranitidine (ZANTAC) 300 MG tablet  TAKE ONE TABLET BY MOUTH ONCE DAILY 02/03/17  Yes Kozlow, Donnamarie Poag, MD  SYNTHROID 50 MCG tablet Take 50 mcg by mouth daily before breakfast.  03/27/17  Yes [provider]  budesonide (RHINOCORT ALLERGY) 32 MCG/ACT nasal spray Place 1 spray into both nostrils daily as needed for allergies.     [provider]  estradiol (VIVELLE-DOT) 0.05 MG/24HR patch Place 1 patch onto the skin 2 (two) times a week. 08/27/16   [provider]  levothyroxine (SYNTHROID) 50 MCG tablet Take 1 tablet (50 mcg total) by mouth daily. 01/22/11 01/17/17  Hoover Browns., MD    Family History Family History  Problem Relation Age of Onset  . Hypertension Mother   . Thyroid disease Mother   . Heart attack Father   . Heart disease Father   . Thyroid disease Sister   . Hypertension Maternal Aunt   . Hypertension Maternal Uncle   . Clotting disorder Maternal Uncle   . Prostate cancer Maternal Uncle   . Colon cancer Maternal Uncle   . Colon polyps Maternal Uncle   . Diabetes Maternal Uncle        all uncles  . Hypertension Maternal Grandmother   . Hyperlipidemia Maternal Grandfather   . Diabetes Maternal Grandfather   . Heart disease Maternal Grandfather   . Hyperlipidemia Paternal Grandmother        also MI  . Breast cancer Paternal Grandmother   . Hyperlipidemia Paternal Grandfather        also MI  . Heart disease Paternal Grandfather   . Hypertension Sister   . Thyroid disease Sister   . Hypertension Son   . Endometriosis Daughter     Social History Social History  Substance Use Topics  . Smoking status: Never Smoker  . Smokeless tobacco: Never Used  . Alcohol use No     Allergies   Codeine phosphate; Prednisone; and Iodine   Review of Systems Review of Systems  Respiratory: Negative for shortness of breath.   Cardiovascular: Negative for chest pain.  Gastrointestinal: Negative for abdominal pain.  Genitourinary: Positive for flank pain.   All other  systems are reviewed and are negative for acute change except as noted in the HPI   Physical Exam Updated Vital Signs BP (!) 178/91 (BP Location: Right Arm)   Pulse 78   Temp 97.6 F (36.4 C) (Oral)   Resp (!) 22   Ht 5' 3.5" (1.613 m)   Wt 81.6 kg (180 lb)   SpO2 100%   BMI 31.39 kg/m   Physical Exam  Constitutional: She is oriented to person, place, and time. She appears well-developed and well-nourished. No distress.  in obvious discomfort  HENT:  Head: Normocephalic and atraumatic.  Nose: Nose normal.  Eyes:  Pupils are equal, round, and reactive to light. Conjunctivae and EOM are normal. Right eye exhibits no discharge. Left eye exhibits no discharge. No scleral icterus.  Neck: Normal range of motion. Neck supple.  Cardiovascular: Normal rate and regular rhythm.  Exam reveals no gallop and no friction rub.   No murmur heard. Pulmonary/Chest: Effort normal and breath sounds normal. No stridor. No respiratory distress. She has no rales.  Abdominal: Soft. She exhibits no distension. There is no tenderness.  Musculoskeletal: She exhibits no edema or tenderness.  Neurological: She is alert and oriented to person, place, and time.  Skin: Skin is warm and dry. No rash noted. She is not diaphoretic. No erythema.  Psychiatric: She has a normal mood and affect.  Vitals reviewed.    ED Treatments / Results  Labs (all labs ordered are listed, but only abnormal results are displayed) Labs Reviewed  URINALYSIS, ROUTINE W REFLEX MICROSCOPIC - Abnormal; Notable for the following:       Result Value   Color, Urine STRAW (*)    Hgb urine dipstick MODERATE (*)    Bacteria, UA RARE (*)    Squamous Epithelial / LPF 0-5 (*)    All other components within normal limits  I-STAT CHEM 8, ED - Abnormal; Notable for the following:    Glucose, Bld 123 (*)    Calcium, Ion 1.00 (*)    All other components within normal limits    EKG  EKG Interpretation None       Radiology No  results found.  Procedures Procedures (including critical care time) EMERGENCY DEPARTMENT US RENAL EXAM  "Study: Limited Retroperitoneal Ultrasound of Kidneys"  INDICATIONS: Flank pain Long and short axis of both kidneys were obtained.   PERFORMED BY: Myself IMAGES ARCHIVED?: Yes LIMITATIONS: Body habitus VIEWS USED: Long axis and Short axis  INTERPRETATION: No Hydronephrosis, No Renal cyst, No Kidney stone   EMERGENCY DEPARTMENT ULTRASOUND  Study: Limited Retroperitoneal Ultrasound of the Abdominal Aorta.  INDICATIONS:Back pain and Age>55 Multiple views of the abdominal aorta were obtained in real-time from the diaphragmatic hiatus to the aortic bifurcation in transverse planes with a multi-frequency probe.  PERFORMED BY: Myself IMAGES ARCHIVED?: Yes LIMITATIONS:  Bowel gas INTERPRETATION:  No abdominal aortic aneurysm    Medications Ordered in ED Medications  morphine 4 MG/ML injection 4 mg (not administered)  ketorolac (TORADOL) 30 MG/ML injection 30 mg (30 mg Intramuscular Given 05/06/17 2036)     Initial Impression / Assessment and Plan / ED Course  I have reviewed the triage vital signs and the nursing notes.  Pertinent labs & imaging results that were available during my care of the patient were reviewed by me and considered in my medical decision making (see chart for details).     UA with hematuria and no evidence of infection. Presentation is consistent with renal colic. Bedside ultrasound without evidence of hydronephrosis or visible stones. Also negative for AAA. Patient provided with IM Toradol which significantly improved her pain  The patient is safe for discharge with strict return precautions.   Final Clinical Impressions(s) / ED Diagnoses   Final diagnoses:  Other microscopic hematuria  Renal colic on left side   Disposition: Discharge  Condition: Good  I have discussed the results, Dx and Tx plan with the patient who expressed understanding  and agree(s) with the plan. Discharge instructions discussed at great length. The patient was given strict return precautions who verbalized understanding of the instructions. No further questions at time of discharge.  New Prescriptions   No medications on file    Follow Up: Leighton Ruff, [place] [address] [phone]  Schedule an appointment as soon as possible for a visit  As needed  Pa, [facility] [address] [place] [address] [place] [address] [phone]  Schedule an appointment as soon as possible for a visit  As needed      Fatima Blank, MD 05/06/17 2204

## 2017-05-06 NOTE — ED Notes (Signed)
EDP at bedside doing ultrasound of abdomen. Patient complaining of 10/10 flank pain-several attempts at IV-Dr. Shellee Milo states to given Toradol IM for pain and to hold off on further IV attempts and bloodwork.

## 2017-05-06 NOTE — ED Notes (Signed)
ED PA at bedside

## 2017-05-06 NOTE — ED Triage Notes (Signed)
patient c/o left flank pain and urinary retention that started today. Patient has PMH kidney stones.  Patient reports that she has urge to urinate but very little actually comes out.

## 2017-05-07 DIAGNOSIS — N201 Calculus of ureter: Secondary | ICD-10-CM | POA: Diagnosis not present

## 2017-05-07 DIAGNOSIS — R1084 Generalized abdominal pain: Secondary | ICD-10-CM | POA: Diagnosis not present

## 2017-05-09 ENCOUNTER — Ambulatory Visit: Payer: PPO | Admitting: Rehabilitation

## 2017-05-14 ENCOUNTER — Ambulatory Visit: Payer: PPO | Admitting: Physical Therapy

## 2017-05-14 ENCOUNTER — Encounter: Payer: Self-pay | Admitting: Physical Therapy

## 2017-05-14 DIAGNOSIS — M542 Cervicalgia: Secondary | ICD-10-CM

## 2017-05-14 DIAGNOSIS — R2681 Unsteadiness on feet: Secondary | ICD-10-CM

## 2017-05-14 DIAGNOSIS — R42 Dizziness and giddiness: Secondary | ICD-10-CM

## 2017-05-14 DIAGNOSIS — R262 Difficulty in walking, not elsewhere classified: Secondary | ICD-10-CM
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
## 2017-05-16 ENCOUNTER — Ambulatory Visit: Payer: PPO | Admitting: Physical Therapy

## 2017-05-16 ENCOUNTER — Encounter: Payer: Self-pay | Admitting: Physical Therapy

## 2017-05-16 DIAGNOSIS — R262 Difficulty in walking, not elsewhere classified: Secondary | ICD-10-CM

## 2017-05-16 DIAGNOSIS — R42 Dizziness and giddiness: Secondary | ICD-10-CM

## 2017-05-16 DIAGNOSIS — R2681 Unsteadiness on feet: Secondary | ICD-10-CM

## 2017-05-16 DIAGNOSIS — M542 Cervicalgia: Secondary | ICD-10-CM | POA: Diagnosis not present
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
## 2017-05-19 ENCOUNTER — Ambulatory Visit: Payer: PPO | Admitting: Physical Therapy

## 2017-05-20 ENCOUNTER — Ambulatory Visit: Payer: PPO | Admitting: Physical Therapy

## 2017-05-20 ENCOUNTER — Encounter: Payer: Self-pay | Admitting: Physical Therapy

## 2017-05-20 DIAGNOSIS — M542 Cervicalgia: Secondary | ICD-10-CM

## 2017-05-20 NOTE — Therapy (Addendum)
[place], [place], [address] Phone: [phone]   Fax:  [phone]  Physical Therapy Treatment  Patient Details  Name: Amber Brewer MRN: [id number] Date of Birth: 07-28-1951 Referring Provider: Melvenia Beam, MD  Encounter Date: 05/20/2017      PT End of Session - 05/20/17 1323    Visit Number 6   Number of Visits 17   Date for PT Re-Evaluation 06/17/17   Authorization Type [employer]: G code/PN 10th visit   PT Start Time 1323   PT Stop Time 1425   PT Time Calculation (min) 62 min   Activity Tolerance Patient tolerated treatment well   Behavior During Therapy [facility] for tasks assessed/performed      Past Medical History:  Diagnosis Date  . Asthma   . Depression   . Gallstones   . GERD (gastroesophageal reflux disease)   . Hx of melanoma in situ 2002  . Hyperlipidemia   . Hypertension   . Hypothyroidism   . IBS (irritable bowel syndrome)   . Nephrolithiasis   . Osteoarthritis   . Status post dilation of esophageal narrowing     Past Surgical History:  Procedure Laterality Date  . ABDOMINAL HYSTERECTOMY  1974  . APPENDECTOMY  1977  . BLADDER SURGERY  1974  . BREAST LUMPECTOMY Right 2011  . CATARACT EXTRACTION Bilateral 2015  . CHOLECYSTECTOMY  1987  . removal of melanoma in situ      There were no vitals filed for this visit.      Subjective Assessment - 05/20/17 1324    Subjective Pressure under jaw and in ears when extending through neck, able to subside when looking down. Feels that medication has made tightness worse. Neck feels "jammed" all of the time.                          [facility] Adult PT Treatment/Exercise - 05/20/17 0001      Neck Exercises: Seated   Neck Retraction Limitations seated & supine cervical retraction   Other Seated Exercise mandibular haptic tapping   Other Seated Exercise scapular retraction for resting posture     Manual  Therapy   Soft tissue mobilization bilateral SCM, scalenes, upper traps   Manual Traction manual cervical traction     Neck Exercises: Stretches   Other Neck Stretches door pec stretch   Other Neck Stretches sidebend stretch for scalenes v upper trap          Trigger Point Dry Needling - 05/20/17 1359    Consent Given? Yes   Education Handout Provided No  verbal education   Muscles Treated Upper Body Sternocleidomastoid  L scalenes, R SCM   Sternocleidomastoid Response Twitch response elicited;Palpable increased muscle length              PT Education - 05/20/17 1518    Education provided Yes   Education Details TPDN rationale and outcomes, rationale for manual & exercises   Person(s) Educated Patient   Methods Explanation;Demonstration;Tactile cues;Verbal cues;Handout   Comprehension Verbalized understanding;Returned demonstration;Verbal cues required;Tactile cues required;Need further instruction          PT Short Term Goals - 05/16/17 1651      PT SHORT TERM GOAL #1   Title TARGET DATE FOR ALL STG 05/18/2017: Pt will demonstrate 5-8 deg increase in cervical ROM in all directions.   Baseline See neck assessment   Time 4   Period  Weeks   Status Partially Met     PT SHORT TERM GOAL #2   Title Pt will report 4/10 dizziness/disequilibrium when performing habituation/neck ROM exercises (especially looking up)   Baseline working towards finding ways to adjust tasks to avoid extreme extension ROM   Time 4   Period Weeks   Status On-going     PT SHORT TERM GOAL #3   Title Pt will participate in BERG/FGA/SOT assessment with LTG TBD   Baseline Berg=54/56, FGA=21/30   Time 4   Period Weeks   Status Achieved     PT SHORT TERM GOAL #4   Title Pt will improve gaze adaptation as indicated by improvement in DVA to 2 line difference   Baseline 3 line difference; 2 line difference on 05/16/17   Time 4   Period Weeks   Status Achieved     PT SHORT TERM GOAL #5    Title Pt will improve gait velocity to > or = 3.5 ft/sec with head turns in various directions without LOB   Baseline 3.31 ft/sec; 3.77 ft/sec on 05/16/17   Time 4   Period Weeks   Status Achieved           PT Long Term Goals - 05/05/17 1504      PT LONG TERM GOAL #1   Title (TARGET DATE FOR LTG 06/17/2017)  Pt will demonstrate independence with neck/postural/vestibular HEP and will report <2/10 disequilibrium with head turns   Time 8   Period Weeks   Status New     PT LONG TERM GOAL #2   Title Pt will improve FGA score to > or = to 23/30 to indicate decreased risk for falls in community.   Baseline 21/30 FGA score   Time 8   Period Weeks   Status New     PT LONG TERM GOAL #3   Title Pt will improve BERG score to > or = to 45/56 to indicate improved standing balance and decreased risk for falls   Baseline Met at baseline with score 54/56.   Time 8   Status Achieved     PT LONG TERM GOAL #4   Title Pt will improve neck ROM to > or = 10 deg for all movements   Time 8   Period Weeks   Status New     PT LONG TERM GOAL #5   Title Pt will ambulate x 1000' outside over uneven/compliant terrain MOD I while performing head turns in various directions; will negotiate 12 stairs alternating sequence with one rail and carrying items in one hand MOD I without LOB   Time 8   Period Weeks   Status New     PT LONG TERM GOAL #6   Title SOT goal if indicated   Baseline TBD     PT LONG TERM GOAL #7   Title Pt will improve Neck Disability Index (NDI/FOTO) score to <20%   Baseline 34%   Time 8   Period Weeks   Status New               Plan - 05/20/17 1520    Clinical Impression Statement TPDN for bilateral scalenes and SCM today, pt verbalized ability to lay supine without pressure in anterior aspect of her neck following treatment. tightness noted around L TMJ that was reduced with haptic tapping. Therex added to maintain gains made today. Will benefit from addressing upper  traps as well at future visit.    PT Treatment/Interventions  ADLs/Self Care Home Management;Cryotherapy;Electrical Stimulation;Moist Heat;Traction;Gait training;Stair training;Functional mobility training;Therapeutic activities;Therapeutic exercise;Balance training;Neuromuscular re-education;Patient/family education;Manual techniques;Passive range of motion;Dry needling;Taping;Vestibular;Visual/perceptual remediation/compensation   PT Next Visit Plan continue to work on ways to adjust home and shopping tasks to avoid extreme neck ROM-especially extension; review x 1 viewing and progress if able; neck stretching program for HEP (avoiding sensation of pressure in head/ears); scapular/postural stabilization exercises, balance/dynamic gait.   PT Home Exercise Plan x 1 viewing for gaze adaptation, habituation for motion and visual sensitivity, posture exercises-stretches for anterior chest, scalene, upper trap, neck ROM; supine or door pec stretch, cervical retraction, scapular retraction   Consulted and Agree with Plan of Care Patient      Patient will benefit from skilled therapeutic intervention in order to improve the following deficits and impairments:  Decreased balance, Decreased range of motion, Dizziness, Postural dysfunction, Impaired vision/preception, Impaired flexibility, Hypomobility  Visit Diagnosis: Cervicalgia     Problem List Patient Active Problem List   Diagnosis Date Noted  . Symptomatic PVCs 10/31/2016  . Benign hypertension 10/31/2016  . Anxiety 10/31/2016  . Hyperlipidemia 05/27/2016  . LEG PAIN, BILATERAL 06/07/2010  . LEG EDEMA, BILATERAL 10/04/2009  . HYPOKALEMIA 07/20/2009  . ALLERGY, HX OF 11/24/2008  . WHEEZING 06/29/2008  . ANXIETY, SITUATIONAL 05/19/2008  . 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 SHORTNESS OF BREATH 05/19/2008  . Pain in the chest 05/19/2008  . 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 HYPOTHYROIDISM, BORDERLINE 12/16/2007  . PANIC ATTACK  06/04/2007  . DISORDER, 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  . Essential hypertension 05/25/2007  . OSTEOARTHRITIS 05/25/2007  . NEPHROLITHIASIS, HX OF 05/25/2007  . ARTHRITIS, HX OF 05/25/2007    Maricus Tanzi C. Ruari Duggan PT, DPT 05/20/17 3:28 PM   [facility] [address] [place], [place], [address] Phone: [phone]   Fax:  [phone]  Name: Amber Brewer MRN: [id number] Date of Birth: [birth year]

## 2017-05-21 ENCOUNTER — Ambulatory Visit: Payer: PPO | Attending: Neurology | Admitting: Physical Therapy

## 2017-05-21 DIAGNOSIS — R2681 Unsteadiness on feet: Secondary | ICD-10-CM | POA: Insufficient documentation

## 2017-05-21 DIAGNOSIS — R262 Difficulty in walking, not elsewhere classified: Secondary | ICD-10-CM | POA: Insufficient documentation

## 2017-05-21 DIAGNOSIS — M542 Cervicalgia: Secondary | ICD-10-CM | POA: Insufficient documentation

## 2017-05-21 DIAGNOSIS — R42 Dizziness and giddiness: Secondary | ICD-10-CM
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
## 2017-05-23 DIAGNOSIS — N201 Calculus of ureter: Secondary | ICD-10-CM | POA: Diagnosis not present

## 2017-05-23 DIAGNOSIS — N200 Calculus of kidney: Secondary | ICD-10-CM | POA: Diagnosis not present

## 2017-05-26 ENCOUNTER — Ambulatory Visit: Payer: PPO | Admitting: Physical Therapy

## 2017-05-27 ENCOUNTER — Encounter: Payer: Self-pay | Admitting: Neurology

## 2017-05-27 ENCOUNTER — Ambulatory Visit (INDEPENDENT_AMBULATORY_CARE_PROVIDER_SITE_OTHER): Payer: PPO | Admitting: Neurology

## 2017-05-27 VITALS — BP 144/78 | HR 66 | Ht 63.0 in | Wt 187.0 lb

## 2017-05-27 DIAGNOSIS — R42 Dizziness and giddiness: Secondary | ICD-10-CM
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
## 2017-05-27 NOTE — Patient Instructions (Signed)

## 2017-05-29 ENCOUNTER — Ambulatory Visit: Payer: PPO | Admitting: Rehabilitation

## 2017-05-29 ENCOUNTER — Encounter: Payer: Self-pay | Admitting: Rehabilitation

## 2017-05-29 ENCOUNTER — Telehealth: Payer: Self-pay | Admitting: Rehabilitation

## 2017-05-29 DIAGNOSIS — M542 Cervicalgia: Secondary | ICD-10-CM | POA: Diagnosis not present

## 2017-05-29 DIAGNOSIS — R2681 Unsteadiness on feet: Secondary | ICD-10-CM
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
## 2017-05-29 NOTE — Telephone Encounter (Signed)
Dr. Jaynee Eagles,   I am seeing Mrs. Amber Brewer at [facility] for PT.  Note that she saw you two days ago and I see in your note that you recommended possible vascular testing.  I think the reason the patient declined is that she thought you meant carotid doppler/vasular study, however in therapy many of her symptoms seem to be related to posterior circulation (vertebral A.) and feel she could benefit from this study to better understand symptoms.  She is doing very well with PT otherwise.  Is there anyway someone from your office could call her to explain/get this scheduled for her?  Thanks so much for your input.  Thanks,  Cameron Sprang, PT, MPT [facility] [address] [place], [place], [address] Phone: [phone]   Fax:  [phone] 05/29/17, 2:45 PM

## 2017-05-29 NOTE — Telephone Encounter (Signed)
Amber Brewer, would you call and discuss with patient that her physical therapist thinks a vascular study of the brain (MRA head or CTA of the head) would be valuable? Explain that a clogged blood vessel can cause dizziness and we would like to evaluate her for that. I would prefer a CTA of the head and neck which involves contrast and a CT scan. Thanks   Raquel Sarna, [place] am [place] of [place] rest of the month but I will have a nurse in our office call and discuss thanks

## 2017-05-30 ENCOUNTER — Other Ambulatory Visit: Payer: Self-pay | Admitting: Allergy and Immunology

## 2017-05-30 NOTE — Telephone Encounter (Signed)
Called and spoke to patient who would like to wait at this time on having CTA. Says that she is a little afraid of having contrast again so soon as she had MRI/MRA in April. Also is dealing w/ a lodged kidney stone. Says that she'll call back in a few weeks to set up scan after she's finished dealing w/ her current health issue.

## 2017-05-31 NOTE — Telephone Encounter (Signed)
Amber Brewer. thanks

## 2017-06-02 ENCOUNTER — Encounter: Payer: Self-pay | Admitting: Physical Therapy

## 2017-06-02 ENCOUNTER — Ambulatory Visit: Payer: PPO | Admitting: Physical Therapy

## 2017-06-02 DIAGNOSIS — R42 Dizziness and giddiness: Secondary | ICD-10-CM

## 2017-06-02 DIAGNOSIS — M542 Cervicalgia: Secondary | ICD-10-CM

## 2017-06-02 DIAGNOSIS — R2681 Unsteadiness on feet: Secondary | ICD-10-CM

## 2017-06-02 DIAGNOSIS — R262 Difficulty in walking, not elsewhere classified: Secondary | ICD-10-CM
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
## 2017-06-03 ENCOUNTER — Telehealth: Payer: Self-pay | Admitting: Physical Therapy

## 2017-06-03 NOTE — Telephone Encounter (Signed)
Pt contacted [place] with questions about symptoms she has had today: feeling cold/chills for about 4 hours this morning, stiffness at neck, feeling faint and high BP (173/87 to 162/89) even after taking medication. She says she tried to contact PCP but line was busy. I advised her to go to ER or nearby clinic to be checked due to systemic nature of her symptoms. Encouraged her to contact me with any further questions. Pt reported in phone call prior to mine that her son is with her.  Ellarose Brandi C. Amadea Keagy PT, DPT 06/03/17 5:22 PM

## 2017-06-04 ENCOUNTER — Ambulatory Visit: Payer: PPO | Admitting: Physical Therapy

## 2017-06-04 ENCOUNTER — Encounter: Payer: Self-pay | Admitting: Physical Therapy

## 2017-06-04 DIAGNOSIS — R2681 Unsteadiness on feet: Secondary | ICD-10-CM

## 2017-06-04 DIAGNOSIS — M542 Cervicalgia: Secondary | ICD-10-CM | POA: Diagnosis not present

## 2017-06-04 DIAGNOSIS — R262 Difficulty in walking, not elsewhere classified: Secondary | ICD-10-CM

## 2017-06-04 DIAGNOSIS — R42 Dizziness and giddiness: Secondary | ICD-10-CM
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
## 2017-06-11 ENCOUNTER — Encounter: Payer: Self-pay | Admitting: Physical Therapy

## 2017-06-11 ENCOUNTER — Ambulatory Visit: Payer: PPO | Admitting: Physical Therapy

## 2017-06-11 DIAGNOSIS — R2681 Unsteadiness on feet: Secondary | ICD-10-CM

## 2017-06-11 DIAGNOSIS — R42 Dizziness and giddiness: Secondary | ICD-10-CM

## 2017-06-11 DIAGNOSIS — M542 Cervicalgia: Secondary | ICD-10-CM

## 2017-06-11 DIAGNOSIS — R262 Difficulty in walking, not elsewhere classified: Secondary | ICD-10-CM
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
## 2017-06-11 NOTE — Patient Instructions (Signed)
Gaze Stabilization: Sitting    Keeping eyes on target on wall 3 feet away, and move head side to side for _30 working up to 60___ seconds. Repeat while moving head up and down for __30 working up to 60__ seconds. Do __2-3__ sessions per day.  Copyright  VHI. All rights reserved.   Gaze Stabilization: Tip Card  1.Target must remain in focus, not blurry, and appear stationary while head is in motion. 2.Perform exercises with small head movements (45 to either side of midline). 3.Increase speed of head motion so long as target is in focus. 4.If you wear eyeglasses, be sure you can see target through lens (therapist will give specific instructions for bifocal / progressive lenses). 5.These exercises may provoke dizziness or nausea. Work through these symptoms. If too dizzy, slow head movement slightly. Rest between each exercise. 6.Exercises demand concentration; avoid distractions.  Balance: Eyes Closed - Bilateral (Varied Surfaces)    Stand, feet-HEELS TOGETHER, close eyes. Maintain balance 30 seconds. Repeat 2 times per set. Do 2 sets per DAY. Do 5 sessions per week.   Feet Together, Head Motion - Eyes Closed      With eyes closed and feet together, move head slowly, up and down 10 TIMES; side to side 10 TIMES. Repeat 2 times per session. Do 2 sessions per day.

## 2017-06-13 ENCOUNTER — Ambulatory Visit: Payer: PPO | Admitting: Physical Therapy

## 2017-06-13 DIAGNOSIS — M542 Cervicalgia: Secondary | ICD-10-CM | POA: Diagnosis not present

## 2017-06-13 DIAGNOSIS — R42 Dizziness and giddiness: Secondary | ICD-10-CM

## 2017-06-13 NOTE — Patient Instructions (Signed)
Shoulder Rotation: Double Arm    On back, knees bent, feet flat, elbows tucked at sides, bent 90, hands palms up. Pull hands apart and down toward floor, keeping elbows near sides. Hold momentarily. Slowly return to starting position. Repeat ___ times. Band color ______   Copyright  VHI. All rights reserved.  Sash    On back, knees bent, feet flat, left hand on left hip, right hand above left. Pull right arm DIAGONALLY (hip to shoulder) across chest. Bring right arm along head toward floor. Hold momentarily. Slowly return to starting position. Repeat ___ times. Do with left arm. Band color ______   Copyright  VHI. All rights reserved.  Side Pull: Double Arm    On back, knees bent, feet flat. Arms perpendicular to body, shoulder level, elbows straight but relaxed. Pull arms out to sides, elbows straight. Resistance band comes across collarbones, hands toward floor. Hold momentarily. Slowly return to starting position. Repeat ___ times. Band color _____    Copyright  VHI. All rights reserved.  Over Head Pull: Wide Grip    On back, knees bent, feet flat, band across thighs, elbows straight but relaxed. Pull hands apart (start). Keeping elbows straight, bring arms up and over head, hands toward floor. Keep steady pull on band. Hold momentarily. Return slowly, keeping pull steady, back to start. Repeat ___ times. Band color ______    Copyright  VHI. All rights reserved.  Over Head Pull: Narrow Grip    On back, knees bent, feet flat, band across thighs, elbows straight but relaxed. Pull hands apart (start). Keeping elbows straight, bring arms up and over head, hands toward floor. Keep pull steady on band. Hold momentarily. Return slowly, keeping pull steady, back to start. Repeat ___ times. Band color ______   Copyright  VHI. All rights reserved.
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
## 2017-06-16 ENCOUNTER — Ambulatory Visit: Payer: PPO | Admitting: Physical Therapy

## 2017-06-16 ENCOUNTER — Telehealth: Payer: Self-pay | Admitting: Internal Medicine

## 2017-06-16 NOTE — Telephone Encounter (Signed)
Spoke with pt, she reports that she did not feel well when she got up this morning. Currently she feels she may pass out, she feels weak and her legs are heavy. Her bp this morning is 168/90. She has not taken her medications and has not eaten. Her husband is on the way home and she will lie down and elevate her legs until he gets there. Advised the patient to eat something to see if that makes her feel better. She has a follow up appointment tomorrow with dr De[facility]. She will call us back.

## 2017-06-16 NOTE — Telephone Encounter (Signed)
Spoke with pt, she reports she is feeling better. Eating something helped.

## 2017-06-16 NOTE — Telephone Encounter (Signed)
New Message  Pt c/o BP issue: STAT if pt c/o blurred vision, one-sided weakness or slurred speech  1. What are your last 5 BP readings? Just taken 168/90  2. Are you having any other symptoms (ex. Dizziness, headache, blurred vision, passed out)? Feels like she's about to pass out   3. What is your BP issue?

## 2017-06-17 ENCOUNTER — Ambulatory Visit: Payer: PPO | Admitting: Internal Medicine

## 2017-06-17 ENCOUNTER — Ambulatory Visit (INDEPENDENT_AMBULATORY_CARE_PROVIDER_SITE_OTHER): Payer: PPO | Admitting: Internal Medicine

## 2017-06-17 ENCOUNTER — Encounter: Payer: Self-pay | Admitting: Internal Medicine

## 2017-06-17 VITALS — BP 130/78 | HR 67 | Ht 64.0 in | Wt 179.6 lb

## 2017-06-17 DIAGNOSIS — I10 Essential (primary) hypertension: Secondary | ICD-10-CM | POA: Diagnosis not present

## 2017-06-17 DIAGNOSIS — F419 Anxiety disorder, unspecified: Secondary | ICD-10-CM | POA: Diagnosis not present

## 2017-06-17 DIAGNOSIS — I493 Ventricular premature depolarization: Secondary | ICD-10-CM | POA: Diagnosis not present

## 2017-06-17 MED ORDER — CARVEDILOL 12.5 MG PO TABS: 12.5000 mg | ORAL_TABLET | Freq: Two times a day (BID) | ORAL | 3 refills | Status: DC

## 2017-06-17 MED ORDER — IRBESARTAN 150 MG PO TABS
150.0000 mg | ORAL_TABLET | Freq: Every day | ORAL | 3 refills | Status: DC
Start: 2017-06-17 — End: 2018-02-17

## 2017-06-17 NOTE — Patient Instructions (Addendum)
Your physician has recommended you make the following change in your medication:  -- INCREASE carvedilol to 12.5mg  twice daily   -- take 2 of your 6.25mg  tablets  -- new Rx has been sent to pharmacy -- DECREASE irbesartan to 150mg  daily  -- cut 300mg  tablets in half  -- new Rx has been sent to pharmacy  Your physician wants you to follow-up in: ONE YEAR with Dr. Debara Pickett. You will receive a reminder letter in the mail two months in advance. If you don't receive a letter, please call our office to schedule the follow-up appointment.
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
## 2017-06-19 ENCOUNTER — Ambulatory Visit: Payer: PPO | Admitting: Physical Therapy

## 2017-06-25 ENCOUNTER — Ambulatory Visit: Payer: PPO | Attending: Neurology | Admitting: Physical Therapy

## 2017-06-25 ENCOUNTER — Encounter: Payer: Self-pay | Admitting: Physical Therapy

## 2017-06-25 VITALS — BP 116/74 | HR 59

## 2017-06-25 DIAGNOSIS — R2681 Unsteadiness on feet: Secondary | ICD-10-CM | POA: Insufficient documentation

## 2017-06-25 DIAGNOSIS — R42 Dizziness and giddiness: Secondary | ICD-10-CM | POA: Diagnosis not present

## 2017-06-25 DIAGNOSIS — R262 Difficulty in walking, not elsewhere classified: Secondary | ICD-10-CM | POA: Insufficient documentation

## 2017-06-25 DIAGNOSIS — M542 Cervicalgia: Secondary | ICD-10-CM | POA: Insufficient documentation
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
## 2017-06-25 NOTE — Patient Instructions (Signed)
Gaze Stabilization - Tip Card  1.Target must remain in focus, not blurry, and appear stationary while head is in motion. 2.Perform exercises with small head movements (45 to either side of midline). 3.Increase speed of head motion so long as target is in focus. 4.If you wear eyeglasses, be sure you can see target through lens (therapist will give specific instructions for bifocal / progressive lenses). 5.These exercises may provoke dizziness or nausea. Work through these symptoms. If too dizzy, slow head movement slightly. Rest between each exercise. 6.Exercises demand concentration; avoid distractions. 7.For safety, perform standing exercises close to a counter, wall, corner, or next to someone.  Copyright  VHI. All rights reserved.   Gaze Stabilization - Standing Feet Apart   Feet shoulder width apart, keeping eyes on target on wall 3 feet away, tilt head down slightly and move head side to side for 45 seconds. Repeat while moving head up and down small range for 45 seconds. *Work up to tolerating 60 seconds, as able. Do 2-3 sessions per day.    Feet Apart (Compliant Surface) Head Motion - Eyes Closed    Stand on compliant surface: pillow or blanket with feet shoulder width apart. Close eyes and STAND STILL FOR 10-12 SECONDS AT A TIME. Repeat 2 times per session. Do 2 sessions per day.

## 2017-06-27 ENCOUNTER — Ambulatory Visit: Payer: PPO | Admitting: Physical Therapy

## 2017-06-27 ENCOUNTER — Encounter: Payer: Self-pay | Admitting: Physical Therapy

## 2017-06-27 DIAGNOSIS — R2681 Unsteadiness on feet: Secondary | ICD-10-CM

## 2017-06-27 DIAGNOSIS — M542 Cervicalgia: Secondary | ICD-10-CM

## 2017-06-27 DIAGNOSIS — R262 Difficulty in walking, not elsewhere classified: Secondary | ICD-10-CM

## 2017-06-27 DIAGNOSIS — Z87442 Personal history of urinary calculi: Secondary | ICD-10-CM | POA: Diagnosis not present

## 2017-06-27 DIAGNOSIS — R42 Dizziness and giddiness: Secondary | ICD-10-CM
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
## 2017-06-27 NOTE — Patient Instructions (Signed)
Rehabilitation      Make small roll in towel and hook it under bones at base of your skull.  Looking and bending head down: Pull in and slightly down and hold for 10-12 seconds.  Move towel down one level and repeat.    SNAG  Using a towel draped around your neck, begin with one hand stabilizing one end of towel with light downward pull. Next, turn your head as far to one direction as able (as in picture). Use opposite hand to gently pull towel across your face making sure not to pull across your jaw to allow for light stretch into rotational direction. DO NOT PULL UPWARDS or LOOK UPWARDS. Hold each direction for 10 seconds and repeat x 5 reps each direction.   Do these 2-3 times per day.      NECK TENSION: Assisted Stretch    Reach right arm around head and hold slightly above ear, stabilize left arm by grabbing side of chair. Gently bring right ear toward right shoulder. Hold position for 15-20 seconds; rotate head and look at arm pit-hold for 20 seconds. Repeat with other arm. Repeat _2_ times, alternating arms. Do _2-3__ times per day.  Neck Flexors    Sit with back straight. Tilt head slightly forward while resisting with fingertips. Do not let head move. Keep chin tucked. Do not allow trunk to lean forward. Hold _5-6___ seconds. Repeat __10__ times. Do __2-3__ sessions per day. CAUTION: Use steady pressure with fingertips only.  Neck: Retraction    Sit with back straight or lie on firm surface. Place hands, with fingers locked, behind head. Keep head in neutral position. Push back with head while resisting with hands. Do not let head actually move. Hold __5-6__ seconds. Repeat _10___ times. Do _2-3__ sessions per day. CAUTION: Pressure should be steady.   Neck: Lateral Tilt    Sit with back straight. Tilt head slightly to right while resisting with fingertips. Do not let head move. Do not allow trunk to lean sideways. Hold _5-6_ seconds. Repeat __10__  times. Do __2-3__ sessions per day. CAUTION: Use steady pressure with fingertips only.  Rotation: Isometric (Supine / Sitting)   Place hands firmly on both sides of head. Motion -Cue patient to exhale while turning head to left -Helper blocks movement. CAUTION: Use gentle pressure to avoid neck strain. Hold __5-6_ seconds. Relax. Repeat _10__ times. Repeat in other direction. Do _2-3__ sessions per day. Variation: Perform with head turned toward: left right   Copyright  VHI. All rights reserved.

## 2017-07-02 ENCOUNTER — Other Ambulatory Visit: Payer: Self-pay | Admitting: Family Medicine

## 2017-07-02 DIAGNOSIS — Z1231 Encounter for screening mammogram for malignant neoplasm of breast: Secondary | ICD-10-CM

## 2017-07-03 ENCOUNTER — Ambulatory Visit: Payer: PPO | Admitting: Physical Therapy

## 2017-07-03 DIAGNOSIS — R2681 Unsteadiness on feet: Secondary | ICD-10-CM

## 2017-07-03 DIAGNOSIS — M542 Cervicalgia: Secondary | ICD-10-CM | POA: Diagnosis not present

## 2017-07-03 DIAGNOSIS — R42 Dizziness and giddiness: Secondary | ICD-10-CM

## 2017-07-03 DIAGNOSIS — R262 Difficulty in walking, not elsewhere classified: Secondary | ICD-10-CM
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
## 2017-07-06 ENCOUNTER — Other Ambulatory Visit: Payer: Self-pay | Admitting: Allergy and Immunology

## 2017-07-14 ENCOUNTER — Encounter: Payer: Self-pay | Admitting: Rehabilitation

## 2017-07-14 ENCOUNTER — Ambulatory Visit: Payer: PPO | Admitting: Rehabilitation

## 2017-07-14 DIAGNOSIS — M542 Cervicalgia: Secondary | ICD-10-CM | POA: Diagnosis not present

## 2017-07-14 NOTE — Patient Instructions (Addendum)
   Seated thoracic Extension  - Find a seat with a low and rigid back. - Sit all the way back in your seat with you feet flat on the floor.  - Reach hands behind your head and extend thoracic spine over the back rest - * Be sure to "chin tuck" to avoid cervical extension Hold position for 5 secs and repeat x 10 reps.      Replace your chin tuck exercises (both seated and lying down) with lying down, perform the chin tuck and then lift head VERY slightly, trying not to use front neck muscles.  Hold for 3-5 secs. Relax.  Do 2 sets of 5 with rest in between.
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
## 2017-07-17 ENCOUNTER — Ambulatory Visit: Payer: PPO | Admitting: Physical Therapy

## 2017-07-24 ENCOUNTER — Ambulatory Visit: Payer: PPO | Attending: Neurology | Admitting: Physical Therapy

## 2017-07-24 ENCOUNTER — Encounter: Payer: Self-pay | Admitting: Physical Therapy

## 2017-07-24 DIAGNOSIS — R42 Dizziness and giddiness: Secondary | ICD-10-CM | POA: Diagnosis not present

## 2017-07-24 DIAGNOSIS — R2681 Unsteadiness on feet: Secondary | ICD-10-CM

## 2017-07-24 DIAGNOSIS — M542 Cervicalgia: Secondary | ICD-10-CM | POA: Diagnosis not present

## 2017-07-24 DIAGNOSIS — R262 Difficulty in walking, not elsewhere classified: Secondary | ICD-10-CM | POA: Diagnosis not present
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
## 2017-07-25 ENCOUNTER — Encounter: Payer: Self-pay | Admitting: Physical Therapy

## 2017-07-25 ENCOUNTER — Other Ambulatory Visit: Payer: Self-pay | Admitting: Allergy and Immunology

## 2017-07-25 ENCOUNTER — Ambulatory Visit: Payer: PPO | Admitting: Physical Therapy

## 2017-07-25 DIAGNOSIS — R2681 Unsteadiness on feet: Secondary | ICD-10-CM

## 2017-07-25 DIAGNOSIS — R42 Dizziness and giddiness: Secondary | ICD-10-CM

## 2017-07-25 DIAGNOSIS — M542 Cervicalgia: Secondary | ICD-10-CM
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
## 2017-07-28 ENCOUNTER — Ambulatory Visit: Payer: PPO | Admitting: Physical Therapy

## 2017-07-28 ENCOUNTER — Encounter: Payer: Self-pay | Admitting: Physical Therapy

## 2017-07-28 DIAGNOSIS — R42 Dizziness and giddiness: Secondary | ICD-10-CM

## 2017-07-28 DIAGNOSIS — R2681 Unsteadiness on feet: Secondary | ICD-10-CM

## 2017-07-28 DIAGNOSIS — M542 Cervicalgia: Secondary | ICD-10-CM | POA: Diagnosis not present
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
## 2017-07-28 NOTE — Patient Instructions (Signed)
  Feet Together (Compliant Surface) Head Motion - Eyes Closed    Stand on compliant surface:  with feet together. Close eyes and move head slowly, up and down x 10. Then left and right x 10.   Do 1 sessions per day.

## 2017-08-01 ENCOUNTER — Ambulatory Visit: Payer: PPO | Admitting: Physical Therapy

## 2017-08-04 ENCOUNTER — Ambulatory Visit: Payer: PPO | Admitting: Physical Therapy

## 2017-08-04 ENCOUNTER — Encounter: Payer: Self-pay | Admitting: Physical Therapy

## 2017-08-04 DIAGNOSIS — M542 Cervicalgia: Secondary | ICD-10-CM | POA: Diagnosis not present

## 2017-08-04 DIAGNOSIS — R2681 Unsteadiness on feet: Secondary | ICD-10-CM

## 2017-08-04 DIAGNOSIS — R42 Dizziness and giddiness: Secondary | ICD-10-CM

## 2017-08-04 DIAGNOSIS — R262 Difficulty in walking, not elsewhere classified: Secondary | ICD-10-CM
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
## 2017-08-05 ENCOUNTER — Other Ambulatory Visit: Payer: Self-pay | Admitting: Allergy and Immunology

## 2017-08-06 ENCOUNTER — Ambulatory Visit: Payer: PPO | Admitting: Physical Therapy

## 2017-08-08 ENCOUNTER — Encounter: Payer: Self-pay | Admitting: Physical Therapy

## 2017-08-08 ENCOUNTER — Ambulatory Visit: Payer: PPO | Admitting: Physical Therapy

## 2017-08-08 DIAGNOSIS — R42 Dizziness and giddiness: Secondary | ICD-10-CM

## 2017-08-08 DIAGNOSIS — M542 Cervicalgia: Secondary | ICD-10-CM

## 2017-08-08 DIAGNOSIS — R2681 Unsteadiness on feet: Secondary | ICD-10-CM

## 2017-08-08 NOTE — Patient Instructions (Signed)
Gaze Stabilization - Standing Feet Apart   STANDING ON PILLOWS: Feet shoulder width apart, keeping eyes on target on wall 3 feet away, tilt head down slightly and move head side to side for 20 seconds. Repeat while moving head up and down for 20 seconds. Do 2-3 sessions per day.    Feet Together (Compliant Surface) Head Motion - Eyes Closed    Stand on compliant surface:  with feet together. Close eyes and move head slowly, up and down x 10. Then left and right x 10.   Do 1 sessions per day.     Seated thoracic Extension  - Find a seat with a low and rigid back. - Sit all the way back in your seat with you feet flat on the floor.  - Reach hands behind your head and extend thoracic spine over the back rest - * Be sure to "chin tuck" to avoid cervical extension Hold position for 5 secs and repeat x 10 reps.    Replace your chin tuck exercises (both seated and lying down) with lying down, perform the chin tuck and then lift head VERY slightly, trying not to use front neck muscles.  Hold for 3-5 secs. Relax.  Do 2 sets of 5 with rest in between.    NECK TENSION: Assisted Stretch    Reach right arm around head and hold slightly above ear, stabilize left arm by grabbing side of chair. Gently bring right ear toward right shoulder. Hold position for 15-20 seconds; rotate head and look at arm pit-hold for 20 seconds. Repeat with other arm. Repeat _2_ times, alternating arms. Do _2-3__ times per day.  Neck Flexors    Sit with back straight. Tilt head slightly forward while resisting with fingertips. Do not let head move. Keep chin tucked. Do not allow trunk to lean forward. Hold _5-6___ seconds. Repeat __10__ times. Do __2-3__ sessions per day. CAUTION: Use steady pressure with fingertips only.  Neck: Retraction    Sit with back straight or lie on firm surface. Place hands, with fingers locked, behind head. Keep head in neutral position. Push back with head while resisting  with hands. Do not let head actually move. Hold __5-6__ seconds. Repeat _10___ times. Do _2-3__ sessions per day. CAUTION: Pressure should be steady.   Neck: Lateral Tilt    Sit with back straight. Tilt head slightly to right while resisting with fingertips. Do not let head move. Do not allow trunk to lean sideways. Hold _5-6_ seconds. Repeat __10__ times. Do __2-3__ sessions per day. CAUTION: Use steady pressure with fingertips only  Lying on mat with towel roll along spine for the following:    On back, knees bent, feet flat, elbows tucked at sides, bent 90, hands palms up. Pull hands apart and down toward floor, keeping elbows near sides. Hold momentarily. Slowly return to starting position. Repeat _10__ times. Band color _red_   Copyright  VHI. All rights reserved.  Sash    On back, knees bent, feet flat, left hand on left hip, right hand above left. Pull right arm DIAGONALLY (hip to shoulder) across chest. Bring right arm along head toward floor. Hold momentarily. Slowly return to starting position. Repeat _10_ times. Do with left arm. Band color _red_____   Copyright  VHI. All rights reserved.  Side Pull: Double Arm    On back, knees bent, feet flat. Arms perpendicular to body, shoulder level, elbows straight but relaxed. Pull arms out to sides, elbows straight. Resistance band comes across  collarbones, hands toward floor. Hold momentarily. Slowly return to starting position. Repeat __10_ times. Band color __red___    Copyright  VHI. All rights reserved.  Over Head Pull: Wide Grip    On back, knees bent, feet flat, band across thighs, elbows straight but relaxed. Pull hands apart (start). Keeping elbows straight, bring arms up and over head, hands toward floor. Keep steady pull on band. Hold momentarily. Return slowly, keeping pull steady, back to start. Repeat _10__ times. Band color _red_____    Copyright  VHI. All rights reserved.  Over Head Pull: Narrow  Grip    On back, knees bent, feet flat, band across thighs, elbows straight but relaxed. Pull hands apart (start). Keeping elbows straight, bring arms up and over head, hands toward floor. Keep pull steady on band. Hold momentarily. Return slowly, keeping pull steady, back to start. Repeat _10__ times. Band color _red_____   Copyright  VHI. All rights reserved.
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
## 2017-08-11 ENCOUNTER — Ambulatory Visit: Payer: PPO | Admitting: Physical Therapy

## 2017-08-12 ENCOUNTER — Encounter: Payer: Self-pay | Admitting: Allergy and Immunology

## 2017-08-12 ENCOUNTER — Ambulatory Visit (INDEPENDENT_AMBULATORY_CARE_PROVIDER_SITE_OTHER): Payer: PPO | Admitting: Allergy and Immunology

## 2017-08-12 VITALS — BP 118/78 | HR 64 | Resp 16

## 2017-08-12 DIAGNOSIS — J3089 Other allergic rhinitis: Secondary | ICD-10-CM | POA: Diagnosis not present

## 2017-08-12 DIAGNOSIS — K219 Gastro-esophageal reflux disease without esophagitis: Secondary | ICD-10-CM | POA: Diagnosis not present

## 2017-08-12 DIAGNOSIS — J4530 Mild persistent asthma, uncomplicated: Secondary | ICD-10-CM | POA: Diagnosis not present

## 2017-08-12 DIAGNOSIS — M542 Cervicalgia: Secondary | ICD-10-CM | POA: Diagnosis not present

## 2017-08-12 NOTE — Progress Notes (Signed)
Follow-up Note  Referring Provider: Leighton Ruff, MD Primary Provider: Leighton Ruff, MD Date of Office Visit: 08/12/2017  Subjective:   Amber Brewer (DOB: [birth year]) is a 66 y.o. female who returns to the [facility] on 08/12/2017 in re-evaluation of the following:  HPI: Avabella returns to this clinic in reevaluation of her asthma and allergic rhinitis and LPR. I last saw her in this clinic March 2018.  She has done wonderful with her asthma and does not have any wheezing and coughing and has no need to use a short acting bronchodilator and is exercising multiple times a week without any difficulty and has not required a systemic steroid to treat an exacerbation.  She has had no problems with her nose and has not required an antibiotic to treat an episode of sinusitis.  She has had no problems with her throat or her reflux. She remains caffeine free for the most part.  Allergies as of 08/12/2017      Reactions   Codeine Phosphate Nausea And Vomiting   REACTION: unspecified   Prednisone Shortness Of Breath   Iodine Rash   Broke out on skin once years ago      Medication List      acyclovir 200 MG capsule Commonly known as:  ZOVIRAX Take 200 mg by mouth 2 (two) times a week.   ARNUITY ELLIPTA 100 MCG/ACT Aepb Generic drug:  Fluticasone Furoate INHALE 1 PUFF INTO LUNGS ONCE DAILY RINSE  GARGLE  SPIT  AFTER  USE   Biotin 10 MG Tabs Take 1 tablet by mouth daily.   carvedilol 12.5 MG tablet Commonly known as:  COREG Take 1 tablet (12.5 mg total) by mouth 2 (two) times daily.   cholecalciferol 1000 units tablet Commonly known as:  VITAMIN D Take 5,000 Units by mouth daily.   cycloSPORINE 0.05 % ophthalmic emulsion Commonly known as:  RESTASIS Place 1 drop into both eyes every morning.   estradiol 0.05 MG/24HR patch Commonly known as:  VIVELLE-DOT Place 1 patch onto the skin 2 (two) times a week.   Fish Oil 1000 MG Caps Take 1 capsule  by mouth daily.   Flax Seed Oil 1000 MG Caps Take 1 capsule by mouth daily.   hydrochlorothiazide 25 MG tablet Commonly known as:  HYDRODIURIL TAKE 1 TABLET BY MOUTH ONCE DAILY FOR BLOOD PRESSURE   irbesartan 150 MG tablet Commonly known as:  AVAPRO Take 1 tablet (150 mg total) by mouth daily.   KAON-CL-10 10 MEQ CR tablet Generic drug:  potassium chloride Take 10 mEq by mouth daily.   levothyroxine 50 MCG tablet Commonly known as:  SYNTHROID, LEVOTHROID Take 50 mcg by mouth daily before breakfast.   montelukast 10 MG tablet Commonly known as:  SINGULAIR TAKE 1 TABLET BY MOUTH ONCE DAILY   omeprazole 20 MG capsule Commonly known as:  PRILOSEC Take 20 mg by mouth daily.   ranitidine 300 MG tablet Commonly known as:  ZANTAC TAKE 1 TABLET BY MOUTH ONCE DAILY (LAST  FILL,  NEEDS  APPOINTMENT)   RHINOCORT ALLERGY 32 MCG/ACT nasal spray Generic drug:  budesonide Place 1 spray into both nostrils daily as needed for allergies.   ZINC PO Take 1 tablet by mouth daily.       Past Medical History:  Diagnosis Date  . Asthma   . Depression   . Gallstones   . GERD (gastroesophageal reflux disease)   . Hx of melanoma in situ 2002  .  Hyperlipidemia   . Hypertension   . Hypothyroidism   . IBS (irritable bowel syndrome)   . Nephrolithiasis   . Osteoarthritis   . Status post dilation of esophageal narrowing     Past Surgical History:  Procedure Laterality Date  . ABDOMINAL HYSTERECTOMY  1974  . APPENDECTOMY  1977  . BLADDER SURGERY  1974  . BREAST LUMPECTOMY Right 2011  . CATARACT EXTRACTION Bilateral 2015  . CHOLECYSTECTOMY  1987  . removal of melanoma in situ      Review of systems negative except as noted in HPI / PMHx or noted below:  Review of Systems  Constitutional: Negative.   HENT: Negative.   Eyes: Negative.   Respiratory: Negative.   Cardiovascular: Negative.   Gastrointestinal: Negative.   Genitourinary: Negative.   Musculoskeletal: Negative.     Skin: Negative.   Neurological: Negative.   Endo/Heme/Allergies: Negative.   Psychiatric/Behavioral: Negative.      Objective:   Vitals:   08/12/17 1116  BP: 118/78  Pulse: 64  Resp: 16          Physical Exam  Constitutional: She is well-developed, well-nourished, and in no distress.  HENT:  Head: Normocephalic.  Right Ear: Tympanic membrane, external ear and ear canal normal.  Left Ear: Tympanic membrane, external ear and ear canal normal.  Nose: Nose normal. No mucosal edema or rhinorrhea.  Mouth/Throat: Uvula is midline, oropharynx is clear and moist and mucous membranes are normal. No oropharyngeal exudate.  Eyes: Conjunctivae are normal.  Neck: Trachea normal. No tracheal tenderness present. No tracheal deviation present. No thyromegaly present.  Cardiovascular: Normal rate, regular rhythm, S1 normal, S2 normal and normal heart sounds.   No murmur heard. Pulmonary/Chest: Breath sounds normal. No stridor. No respiratory distress. She has no wheezes. She has no rales.  Musculoskeletal: She exhibits no edema.  Lymphadenopathy:       Head (right side): No tonsillar adenopathy present.       Head (left side): No tonsillar adenopathy present.    She has no cervical adenopathy.  Neurological: She is alert. Gait normal.  Skin: No rash noted. She is not diaphoretic. No erythema. Nails show no clubbing.  Psychiatric: Mood and affect normal.    Diagnostics:    Spirometry was performed and demonstrated an FEV1 of 1.82 at 76 % of predicted.  The patient had an Asthma Control Test with the following results: ACT Total Score: 22.    Assessment and Plan:   1. Asthma, well controlled, mild persistent   2. Other allergic rhinitis   3. LPRD (laryngopharyngeal reflux disease)      1. Continue to Treat inflammation:   A. DECREASE Arnuity 100 one inhalation MONDAY - FRIDAY  B. DECREASE OTC Rhinocort one spray each nostril Monday - FRIDAY  C. DISCONTINUE montelukast  2.  Continue to Treat reflux:   A. minimize caffeine and chocolate    B. omeprazole 20 mg in a.m.  C. ranitidine 300 mg in PM  3. If needed:   A. nasal saline wash  B. ProAir HFA 2 puffs every 4-6 hours  C. OTC antihistamine - Claritin/Allegra/Zyrtec  D. OTC Mucinex DM 2 tablets twice a day  4. "Action plan" for asthma flare up:   A. increase Arnuity to 2 inhalations twice a day  B. use ProAir HFA if needed  5. Return to clinic in February 2019 or earlier if problem  6. Flu vaccine administered in clinic today  Valyncia has done very well over the  course of the past 6 months and I think there is an opportunity to consolidate some of her medical treatment as noted above. I will see her back in this clinic in February 2019 or earlier if there is a problem.  Allena Katz, MD Allergy / Immunology [place]

## 2017-08-12 NOTE — Patient Instructions (Addendum)
    1. Continue to Treat inflammation:   A. DECREASE Arnuity 100 one inhalation MONDAY - FRIDAY  B. DECREASE OTC Rhinocort one spray each nostril Monday - FRIDAY  C. DISCONTINUE montelukast  2. Continue to Treat reflux:   A. minimize caffeine and chocolate    B. omeprazole 20 mg in a.m.  C. ranitidine 300 mg in PM  3. If needed:   A. nasal saline wash  B. ProAir HFA 2 puffs every 4-6 hours  C. OTC antihistamine - Claritin/Allegra/Zyrtec  D. OTC Mucinex DM 2 tablets twice a day  4. "Action plan" for asthma flare up:   A. increase Arnuity to 2 inhalations twice a day  B. use ProAir HFA if needed  5. Return to clinic in February 2019 or earlier if problem  6. Flu vaccine administered in clinic today

## 2017-08-13 DIAGNOSIS — L821 Other seborrheic keratosis: Secondary | ICD-10-CM | POA: Diagnosis not present

## 2017-08-13 DIAGNOSIS — Z85828 Personal history of other malignant neoplasm of skin: Secondary | ICD-10-CM | POA: Diagnosis not present

## 2017-08-13 DIAGNOSIS — D2361 Other benign neoplasm of skin of right upper limb, including shoulder: Secondary | ICD-10-CM | POA: Diagnosis not present

## 2017-08-13 DIAGNOSIS — Z23 Encounter for immunization: Secondary | ICD-10-CM | POA: Diagnosis not present

## 2017-08-13 DIAGNOSIS — D2271 Melanocytic nevi of right lower limb, including hip: Secondary | ICD-10-CM | POA: Diagnosis not present

## 2017-08-13 DIAGNOSIS — D2371 Other benign neoplasm of skin of right lower limb, including hip: Secondary | ICD-10-CM | POA: Diagnosis not present

## 2017-08-13 DIAGNOSIS — L7211 Pilar cyst: Secondary | ICD-10-CM | POA: Diagnosis not present

## 2017-08-13 DIAGNOSIS — L570 Actinic keratosis: Secondary | ICD-10-CM | POA: Diagnosis not present

## 2017-08-13 DIAGNOSIS — D225 Melanocytic nevi of trunk: Secondary | ICD-10-CM | POA: Diagnosis not present

## 2017-08-13 DIAGNOSIS — Z86018 Personal history of other benign neoplasm: Secondary | ICD-10-CM | POA: Diagnosis not present

## 2017-08-13 DIAGNOSIS — Z808 Family history of malignant neoplasm of other organs or systems: Secondary | ICD-10-CM | POA: Diagnosis not present

## 2017-08-13 DIAGNOSIS — Z87898 Personal history of other specified conditions: Secondary | ICD-10-CM | POA: Diagnosis not present

## 2017-08-14 ENCOUNTER — Ambulatory Visit: Payer: PPO | Admitting: Physical Therapy

## 2017-08-26 ENCOUNTER — Ambulatory Visit
Admission: RE | Admit: 2017-08-26 | Discharge: 2017-08-26 | Disposition: A | Discharge status: Home / Self Care | Payer: PPO | Source: Ambulatory Visit | Attending: Family Medicine | Admitting: Family Medicine

## 2017-08-26 DIAGNOSIS — Z1231 Encounter for screening mammogram for malignant neoplasm of breast: Secondary | ICD-10-CM | POA: Diagnosis not present

## 2017-09-03 ENCOUNTER — Other Ambulatory Visit: Payer: Self-pay | Admitting: Allergy and Immunology

## 2017-09-04 ENCOUNTER — Other Ambulatory Visit: Payer: Self-pay | Admitting: Allergy and Immunology

## 2017-09-04 ENCOUNTER — Telehealth: Payer: Self-pay | Admitting: Allergy and Immunology

## 2017-09-04 NOTE — Telephone Encounter (Signed)
Sent in through surescripts.

## 2017-09-04 NOTE — Telephone Encounter (Signed)
Pt called and needs to have the Ranitidine hci 300 mg change to 90 day .

## 2017-10-22 DIAGNOSIS — M8588 Other specified disorders of bone density and structure, other site: Secondary | ICD-10-CM | POA: Diagnosis not present

## 2017-11-08 ENCOUNTER — Other Ambulatory Visit: Payer: Self-pay | Admitting: Allergy and Immunology

## 2017-11-10 NOTE — Telephone Encounter (Signed)
Courtesy refill. will last patient until able to see doctor.

## 2017-12-02 ENCOUNTER — Ambulatory Visit: Payer: PPO | Admitting: Allergy and Immunology

## 2017-12-02 ENCOUNTER — Encounter: Payer: Self-pay | Admitting: Allergy and Immunology

## 2017-12-02 VITALS — BP 108/70 | HR 61 | Ht 64.0 in | Wt 179.0 lb

## 2017-12-02 DIAGNOSIS — J3089 Other allergic rhinitis: Secondary | ICD-10-CM | POA: Diagnosis not present

## 2017-12-02 DIAGNOSIS — J4530 Mild persistent asthma, uncomplicated: Secondary | ICD-10-CM | POA: Diagnosis not present

## 2017-12-02 DIAGNOSIS — K219 Gastro-esophageal reflux disease without esophagitis: Secondary | ICD-10-CM | POA: Diagnosis not present

## 2017-12-02 MED ORDER — RANITIDINE HCL 300 MG PO TABS: ORAL_TABLET | ORAL | 1 refills | Status: DC

## 2017-12-02 NOTE — Progress Notes (Signed)
Follow-up Note  Referring Provider: Leighton Ruff, MD Primary Provider: Leighton Ruff, MD Date of Office Visit: 12/02/2017  Subjective:   Amber Brewer (DOB: 1950/10/22) is a 67 y.o. female who returns to the [facility] [place] on 12/02/2017 in re-evaluation of the following:  HPI: Amber Brewer returns to this clinic in reevaluation of asthma and allergic rhinitis and LPR.  Her last visit to this clinic was 12 August 2017.  Overall she has just done wonderful with her plan and rarely has any significant respiratory tract symptoms.  When she wakes up in the morning she has some slight cough but this clears up in the rest of the day she has absolutely no respiratory tract symptoms involving her chest or her throat or her nose.  She has not required a systemic steroid or an antibiotic to treat any type of respiratory tract issue.  She rarely uses a short acting bronchodilator.  She can exercise without any difficulty.  She has had no problems with reflux.  She had to discontinue omeprazole because it started to produce problems with abdominal burning and abdominal pain and she has been off this medication for 6 weeks and is relying on the use of ranitidine every day.  She still remains away from all caffeine consumption.  Allergies as of 12/02/2017      Reactions   Codeine Phosphate Nausea And Vomiting   REACTION: unspecified   Prednisone Shortness Of Breath   Iodine Rash   Broke out on skin once years ago      Medication List      acyclovir 200 MG capsule Commonly known as:  ZOVIRAX Take 200 mg by mouth 2 (two) times a week.   ARNUITY ELLIPTA 100 MCG/ACT Aepb Generic drug:  Fluticasone Furoate INHALE 1 PUFF INTO LUNGS ONCE DAILY RINSE  AND  SPIT  AFTER  USE   Biotin 10 MG Tabs Take 1 tablet by mouth daily.   carvedilol 12.5 MG tablet Commonly known as:  COREG Take 1 tablet (12.5 mg total) by mouth 2 (two) times daily.   cholecalciferol 1000 units  tablet Commonly known as:  VITAMIN D Take 5,000 Units by mouth daily.   cycloSPORINE 0.05 % ophthalmic emulsion Commonly known as:  RESTASIS Place 1 drop into both eyes every morning.   estradiol 0.05 MG/24HR patch Commonly known as:  VIVELLE-DOT Place 1 patch onto the skin 2 (two) times a week.   Fish Oil 1000 MG Caps Take 1 capsule by mouth daily.   Flax Seed Oil 1000 MG Caps Take 1 capsule by mouth daily.   hydrochlorothiazide 25 MG tablet Commonly known as:  HYDRODIURIL TAKE 1 TABLET BY MOUTH ONCE DAILY FOR BLOOD PRESSURE   irbesartan 150 MG tablet Commonly known as:  AVAPRO Take 1 tablet (150 mg total) by mouth daily.   KAON-CL-10 10 MEQ CR tablet Generic drug:  potassium chloride Take 10 mEq by mouth daily.   levothyroxine 50 MCG tablet Commonly known as:  SYNTHROID, LEVOTHROID Take 50 mcg by mouth daily before breakfast.   ranitidine 300 MG tablet Commonly known as:  ZANTAC TAKE 1 TABLET BY MOUTH ONCE DAILY(NEED APPT FOR FUTURE REFILLS)   RHINOCORT ALLERGY 32 MCG/ACT nasal spray Generic drug:  budesonide Place 1 spray into both nostrils daily as needed for allergies.   ZINC PO Take 1 tablet by mouth daily.       Past Medical History:  Diagnosis Date  . Asthma   . Depression   .  Gallstones   . GERD (gastroesophageal reflux disease)   . Hx of melanoma in situ 2002  . Hyperlipidemia   . Hypertension   . Hypothyroidism   . IBS (irritable bowel syndrome)   . Nephrolithiasis   . Osteoarthritis   . Status post dilation of esophageal narrowing     Past Surgical History:  Procedure Laterality Date  . ABDOMINAL HYSTERECTOMY  1974  . APPENDECTOMY  1977  . BLADDER SURGERY  1974  . BREAST LUMPECTOMY Right 2011  . CATARACT EXTRACTION Bilateral 2015  . CHOLECYSTECTOMY  1987  . removal of melanoma in situ      Review of systems negative except as noted in HPI / PMHx or noted below:  Review of Systems  Constitutional: Negative.   HENT: Negative.    Eyes: Negative.   Respiratory: Negative.   Cardiovascular: Negative.   Gastrointestinal: Negative.   Genitourinary: Negative.   Musculoskeletal: Negative.   Skin: Negative.   Neurological: Negative.   Endo/Heme/Allergies: Negative.   Psychiatric/Behavioral: Negative.      Objective:   Vitals:   12/02/17 1052  BP: 108/70  Pulse: 61  SpO2: 96%   Height: 5\' 4"  (162.6 cm)  Weight: 179 lb (81.2 kg)   Physical Exam  Constitutional: She is well-developed, well-nourished, and in no distress.  HENT:  Head: Normocephalic.  Right Ear: Tympanic membrane, external ear and ear canal normal.  Left Ear: Tympanic membrane, external ear and ear canal normal.  Nose: Nose normal. No mucosal edema or rhinorrhea.  Mouth/Throat: Uvula is midline, oropharynx is clear and moist and mucous membranes are normal. No oropharyngeal exudate.  Eyes: Conjunctivae are normal.  Neck: Trachea normal. No tracheal tenderness present. No tracheal deviation present. No thyromegaly present.  Cardiovascular: Normal rate, regular rhythm, S1 normal, S2 normal and normal heart sounds.  No murmur heard. Pulmonary/Chest: Breath sounds normal. No stridor. No respiratory distress. She has no wheezes. She has no rales.  Musculoskeletal: She exhibits no edema.  Lymphadenopathy:       Head (right side): No tonsillar adenopathy present.       Head (left side): No tonsillar adenopathy present.    She has no cervical adenopathy.  Neurological: She is alert. Gait normal.  Skin: No rash noted. She is not diaphoretic. No erythema. Nails show no clubbing.  Psychiatric: Mood and affect normal.    Diagnostics:    Spirometry was performed and demonstrated an FEV1 of 1.72 at 75 % of predicted.  The patient had an Asthma Control Test with the following results: ACT Total Score: 21.    Assessment and Plan:   1. Asthma, well controlled, mild persistent   2. Other allergic rhinitis   3. LPRD (laryngopharyngeal reflux  disease)      1. Continue to Treat inflammation:   A. Arnuity 100 one inhalation MONDAY - FRIDAY  B. OTC Rhinocort one spray each nostril Monday - FRIDAY  2. Continue to Treat reflux:   A. minimize caffeine and chocolate    B. ranitidine 300 mg in PM  3. If needed:   A. nasal saline wash  B. ProAir HFA 2 puffs every 4-6 hours  C. OTC antihistamine - Claritin/Allegra/Zyrtec  D. OTC Mucinex DM 2 tablets twice a day  4. "Action plan" for asthma flare up:   A. increase Arnuity to 2 inhalations twice a day  B. use ProAir HFA if needed  5. Return to clinic in July 2019 or earlier if problem  Amber Brewer has done wonderful on  her current plan which includes anti-inflammatory agents for her upper and lower airway and therapy directed against reflux.  I would like to keep her on her current dosage as she goes through this upcoming spring season and I will see her back in this clinic in July 2019 or earlier if there is a problem.  Allena Katz, MD Allergy / Immunology [place]

## 2017-12-02 NOTE — Patient Instructions (Signed)
    1. Continue to Treat inflammation:   A. Arnuity 100 one inhalation MONDAY - FRIDAY  B. OTC Rhinocort one spray each nostril Monday - FRIDAY  2. Continue to Treat reflux:   A. minimize caffeine and chocolate    B. ranitidine 300 mg in PM  3. If needed:   A. nasal saline wash  B. ProAir HFA 2 puffs every 4-6 hours  C. OTC antihistamine - Claritin/Allegra/Zyrtec  D. OTC Mucinex DM 2 tablets twice a day  4. "Action plan" for asthma flare up:   A. increase Arnuity to 2 inhalations twice a day  B. use ProAir HFA if needed  5. Return to clinic in July 2019 or earlier if problem

## 2017-12-03 ENCOUNTER — Encounter: Payer: Self-pay | Admitting: Allergy and Immunology

## 2017-12-10 ENCOUNTER — Other Ambulatory Visit: Payer: Self-pay | Admitting: Internal Medicine

## 2017-12-10 DIAGNOSIS — I493 Ventricular premature depolarization: Secondary | ICD-10-CM

## 2017-12-10 NOTE — Telephone Encounter (Signed)
REFILL 

## 2017-12-16 DIAGNOSIS — I10 Essential (primary) hypertension: Secondary | ICD-10-CM | POA: Diagnosis not present

## 2017-12-16 DIAGNOSIS — J9801 Acute bronchospasm: Secondary | ICD-10-CM | POA: Diagnosis not present

## 2017-12-16 DIAGNOSIS — Z1211 Encounter for screening for malignant neoplasm of colon: Secondary | ICD-10-CM | POA: Diagnosis not present

## 2017-12-16 DIAGNOSIS — R7301 Impaired fasting glucose: Secondary | ICD-10-CM | POA: Diagnosis not present

## 2017-12-16 DIAGNOSIS — E7800 Pure hypercholesterolemia, unspecified: Secondary | ICD-10-CM | POA: Diagnosis not present

## 2017-12-16 DIAGNOSIS — G4700 Insomnia, unspecified: Secondary | ICD-10-CM | POA: Diagnosis not present

## 2017-12-16 DIAGNOSIS — Z23 Encounter for immunization: Secondary | ICD-10-CM | POA: Diagnosis not present

## 2017-12-16 DIAGNOSIS — E039 Hypothyroidism, unspecified: Secondary | ICD-10-CM | POA: Diagnosis not present

## 2017-12-16 DIAGNOSIS — Z0000 Encounter for general adult medical examination without abnormal findings: Secondary | ICD-10-CM | POA: Diagnosis not present

## 2017-12-16 DIAGNOSIS — F419 Anxiety disorder, unspecified: Secondary | ICD-10-CM | POA: Diagnosis not present

## 2017-12-16 DIAGNOSIS — Z1389 Encounter for screening for other disorder: Secondary | ICD-10-CM | POA: Diagnosis not present

## 2018-01-28 ENCOUNTER — Encounter: Payer: Self-pay | Admitting: Family Medicine

## 2018-02-17 ENCOUNTER — Other Ambulatory Visit: Payer: Self-pay | Admitting: Adult Health

## 2018-02-17 ENCOUNTER — Ambulatory Visit (INDEPENDENT_AMBULATORY_CARE_PROVIDER_SITE_OTHER): Payer: PPO | Admitting: Adult Health

## 2018-02-17 ENCOUNTER — Encounter: Payer: Self-pay | Admitting: Adult Health

## 2018-02-17 VITALS — BP 128/80 | Temp 98.3°F | Wt 174.0 lb

## 2018-02-17 DIAGNOSIS — E039 Hypothyroidism, unspecified: Secondary | ICD-10-CM

## 2018-02-17 DIAGNOSIS — Z7689 Persons encountering health services in other specified circumstances: Secondary | ICD-10-CM

## 2018-02-17 DIAGNOSIS — F419 Anxiety disorder, unspecified: Secondary | ICD-10-CM | POA: Diagnosis not present

## 2018-02-17 DIAGNOSIS — I10 Essential (primary) hypertension: Secondary | ICD-10-CM

## 2018-02-17 LAB — TSH: TSH: 2.16 u[IU]/mL (ref 0.35–4.50)

## 2018-02-17 LAB — T3, FREE: T3, Free: 6.4 pg/mL — ABNORMAL HIGH (ref 2.3–4.2)

## 2018-02-17 LAB — T4, FREE: FREE T4: 1.76 ng/dL — AB (ref 0.60–1.60)

## 2018-02-17 MED ORDER — POTASSIUM CHLORIDE ER 10 MEQ PO TBCR: 10.0000 meq | EXTENDED_RELEASE_TABLET | Freq: Every day | ORAL | 3 refills | Status: DC

## 2018-02-17 MED ORDER — IRBESARTAN 150 MG PO TABS: 150.0000 mg | ORAL_TABLET | Freq: Every day | ORAL | 3 refills | Status: DC

## 2018-02-17 MED ORDER — CITALOPRAM HYDROBROMIDE 10 MG PO TABS: 5.0000 mg | ORAL_TABLET | Freq: Every day | ORAL | 0 refills | Status: DC

## 2018-02-17 NOTE — Telephone Encounter (Signed)
Sent to the pharmacy by e-scribe. 

## 2018-02-17 NOTE — Telephone Encounter (Signed)
Patient called and asked about the refill of Potassium, she says "I take this everyday for years because the Hydrochlorothiazide I take wipes out my potassium." I advised the refill request will be sent to the provider for approval.  KDur 10 meq and Irbesartan refill Last OV: 02/17/18 Last refill:KDur (listed 2012); Irbesartan (05/2017) by another provider Pharmacy: [facility] [address] ([address] [place] [phone] (Phone) [phone] (Fax)

## 2018-02-17 NOTE — Patient Instructions (Signed)
It was great seeing you today   Please follow up in one month to see how you are doing after starting celexa and for check of A1c

## 2018-02-17 NOTE — Telephone Encounter (Signed)
Ok to fill both for one year

## 2018-02-17 NOTE — Telephone Encounter (Signed)
Copied from [place] [id number]. Topic: Quick Communication - Rx Refill/Question >> Feb 17, 2018 12:27 PM Cleaster Cor[place], [place] wrote: Medication: potassium chloride (KAON-CL-10) 10 MEQ CR tablet [[id number]] irbesartan (AVAPRO) 150 MG tablet [[id number]]  Has the patient contacted their pharmacy?no (Agent: If no, request that the patient contact the pharmacy for the refill.) Preferred Pharmacy (with phone number or street name): [facility] ([address] [place] [address] [place] ([place]) [place] [address] Phone: [phone] Fax: [phone]   Agent: Please be advised that RX refills may take up to 3 business days. We ask that you follow-up with your pharmacy.

## 2018-02-17 NOTE — Progress Notes (Signed)
Patient presents to clinic today to establish care. She is a pleasant 67 year old female who  has a past medical history of Asthma, Chicken pox, DDD (degenerative disc disease), cervical, Depression, Gallstones, GERD (gastroesophageal reflux disease), melanoma in situ (2002), UTI (urinary tract infection), Hyperlipidemia, Hypertension, Hypothyroidism, IBS (irritable bowel syndrome), Melanoma ([place]), Nephrolithiasis, Osteoarthritis, PVC's (premature ventricular contractions), and Status post dilation of esophageal narrowing.   She is a former patient of Dr. Leighton Ruff.  Her last physical was in February 2019.    Acute Concerns: Establish Care   Chronic Issues:  Essential Hypertension - Takes HCTZ, Avapro, and Coreg 12.5 mg - well controlled.  BP Readings from Last 3 Encounters:  02/17/18 128/80  12/02/17 108/70  08/12/17 118/78   Hypothyroidism - Takes synthroid 50 mcg every morning.  She reports only that she is been feeling more cold and "foggy headed".  Did have her thyroid checked in February at her physical and reports that the screening was normal.  Seasonal Allergies and Asthma - Is seen by Allergy and Asthma. Currently prescribed Arnuity Ellipta/Rhinocort  Hyperlipidemia - diet related. Not on a statin   Pre diabetic - reports last A1c was 6.3 at [facility]  Anxiety-currently working, taking care of the needs of her elderly mother, and taking care of her husband.  She reports over the last few months anxiety has been slowly becoming worse.  Finds her self not sleeping well at night and when she wakes up in the middle the night she can get back to sleep due to racing thoughts.   Health Maintenance: Dental -- Routine Care Vision -- Routine Care  Immunizations -- UTD Colonoscopy -- Needs to have done.  Mammogram -- UTD  PAP -- UTD  Bone Density -- nEEDS Diet: Has been working on diet since she found out she was pre diabetic. She has been working portion control.    Exercise: She has been exercising and is walking her dog multiple times per day.   Treatment Team  - Dermatology - Dr. Delman Cheadle - yearly  - Cardiology - Dr. Debara Pickett - yearly  - Allergy and Asthma - Dr. Carmelina Peal  - Neurology - Dr. Jaynee Eagles  - GI - Dr. Henrene Pastor.      Past Medical History:  Diagnosis Date  . Asthma   . Chicken pox   . DDD (degenerative disc disease), cervical   . Depression   . Gallstones   . GERD (gastroesophageal reflux disease)   . Hx of melanoma in situ 2002  . Hx: UTI (urinary tract infection)   . Hyperlipidemia   . Hypertension   . Hypothyroidism   . IBS (irritable bowel syndrome)   . Melanoma ([place])    x2   . Nephrolithiasis   . Osteoarthritis   . PVC's (premature ventricular contractions)   . Status post dilation of esophageal narrowing     Past Surgical History:  Procedure Laterality Date  . ABDOMINAL HYSTERECTOMY  1974  . APPENDECTOMY  1977  . BLADDER SURGERY  1974  . BREAST LUMPECTOMY Right 2011  . CATARACT EXTRACTION Bilateral 2015  . CHOLECYSTECTOMY  1987  . removal of melanoma in situ      Current Outpatient Medications on File Prior to Visit  Medication Sig Dispense Refill  . acyclovir (ZOVIRAX) 200 MG capsule Take 200 mg by mouth 2 (two) times a week.    . Albuterol Sulfate (PROAIR HFA IN) Inhale into the lungs.    . ARNUITY ELLIPTA 100 MCG/ACT  AEPB INHALE 1 PUFF INTO LUNGS ONCE DAILY RINSE  AND  SPIT  AFTER  USE 90 each 1  . BIOTIN PO Take 200 mcg by mouth daily.    . budesonide (RHINOCORT ALLERGY) 32 MCG/ACT nasal spray Place 1 spray into both nostrils daily as needed for allergies.     . carvedilol (COREG) 12.5 MG tablet TAKE ONE TABLET BY MOUTH TWICE DAILY (Patient taking differently: TAKE ONE TABLET IN THE MORNING AND 1/2 TAB AT NIGHT) 180 tablet 3  . cholecalciferol (VITAMIN D) 1000 UNITS tablet Take 5,000 Units by mouth daily.     . cycloSPORINE (RESTASIS) 0.05 % ophthalmic emulsion Place 1 drop into both eyes 2 (two) times daily.     Marland Kitchen  estradiol (VIVELLE-DOT) 0.05 MG/24HR patch Place 1 patch onto the skin 2 (two) times a week.    . Flaxseed, Linseed, (FLAX SEED OIL) 1000 MG CAPS Take 1 capsule by mouth daily.     . hydrochlorothiazide 25 MG tablet TAKE 1 TABLET BY MOUTH ONCE DAILY FOR BLOOD PRESSURE 30 tablet 11  . irbesartan (AVAPRO) 150 MG tablet Take 1 tablet (150 mg total) by mouth daily. 90 tablet 3  . levothyroxine (SYNTHROID, LEVOTHROID) 50 MCG tablet Take 50 mcg by mouth daily before breakfast.    . Omega-3 Fatty Acids (FISH OIL) 1000 MG CAPS Take 1 capsule by mouth daily.     . potassium chloride (KAON-CL-10) 10 MEQ CR tablet Take 10 mEq by mouth daily.      . ranitidine (ZANTAC) 300 MG tablet TAKE 1 TABLET BY MOUTH ONCE DAILY 90 tablet 1  . Zinc 100 MG TABS Take 1 tablet by mouth daily.     No current facility-administered medications on file prior to visit.     Allergies  Allergen Reactions  . Codeine Phosphate Nausea And Vomiting    REACTION: unspecified  . Prednisone Shortness Of Breath  . Iodine Rash    Broke out on skin once years ago    Family History  Problem Relation Age of Onset  . Hypertension Mother   . Thyroid disease Mother   . Arthritis Mother   . Depression Mother   . Hearing loss Mother   . Miscarriages / [place] Mother   . Heart attack Father   . Heart disease Father   . Alcohol abuse Father   . Arthritis Father   . Depression Father   . Drug abuse Father   . Hypertension Father   . Alcohol abuse Sister   . Arthritis Sister   . Depression Sister   . Hearing loss Sister   . Hypotension Sister   . Hypertension Maternal Aunt   . Hypertension Maternal Uncle   . Clotting disorder Maternal Uncle   . Prostate cancer Maternal Uncle   . Colon cancer Maternal Uncle   . Colon polyps Maternal Uncle   . Diabetes Maternal Uncle        all uncles  . Arthritis Maternal Grandmother   . Asthma Maternal Grandmother   . Breast cancer Maternal Grandmother   . Stomach cancer Maternal  Grandmother   . Hearing loss Maternal Grandmother   . Diabetes Maternal Grandfather   . Heart disease Maternal Grandfather   . Arthritis Maternal Grandfather   . Hearing loss Maternal Grandfather   . Hypertension Maternal Grandfather   . Kidney disease Maternal Grandfather   . Hyperlipidemia Paternal Grandmother        also MI  . Breast cancer Paternal Grandmother   .  Arthritis Paternal Grandmother   . Heart attack Paternal Grandmother   . Hyperlipidemia Paternal Grandfather        also MI  . Heart disease Paternal Grandfather   . Heart attack Paternal Grandfather   . Depression Sister   . Heart disease Sister   . Hyperlipidemia Sister   . Hypertension Son   . Endometriosis Daughter     Social History   Socioeconomic History  . Marital status: Married    Spouse name: Not on file  . Number of children: 2  . Years of education: 34  . Highest education level: Not on file  Occupational History  . Occupation: licensed Engineer, site: OTHER    Comment: Office manager  Social Needs  . Financial resource strain: Not on file  . Food insecurity:    Worry: Not on file    Inability: Not on file  . Transportation needs:    Medical: Not on file    Non-medical: Not on file  Tobacco Use  . Smoking status: Never Smoker  . Smokeless tobacco: Never Used  Substance and Sexual Activity  . Alcohol use: No  . Drug use: No  . Sexual activity: Yes    Birth control/protection: Post-menopausal  Lifestyle  . Physical activity:    Days per week: Not on file    Minutes per session: Not on file  . Stress: Not on file  Relationships  . Social connections:    Talks on phone: Not on file    Gets together: Not on file    Attends religious service: Not on file    Active member of club or organization: Not on file    Attends meetings of clubs or organizations: Not on file    Relationship status: Not on file  . Intimate partner violence:    Fear of current or ex partner:  Not on file    Emotionally abused: Not on file    Physically abused: Not on file    Forced sexual activity: Not on file  Other Topics Concern  . Not on file  Social History Narrative   epworth sleepiness scale = 8 (01/18/2016)    Review of Systems  Constitutional: Negative.   HENT: Negative.   Eyes: Negative.   Respiratory: Negative.   Cardiovascular: Negative.   Genitourinary: Negative.   Musculoskeletal: Positive for back pain and joint pain.  Skin: Negative.   Neurological: Negative.   Endo/Heme/Allergies:       Cold intolerance    Psychiatric/Behavioral: The patient is nervous/anxious and has insomnia.   All other systems reviewed and are negative.      BP 128/80 (BP Location: Left Arm)   Temp 98.3 F (36.8 C) (Oral)   Wt 174 lb (78.9 kg)   BMI 29.87 kg/m   Physical Exam  Constitutional: She is oriented to person, place, and time. She appears well-developed and well-nourished. No distress.  Eyes: Pupils are equal, round, and reactive to light. EOM are normal.  Neck: Normal range of motion. Neck supple. No JVD present. No tracheal deviation present. No thyromegaly present.  Cardiovascular: Normal rate, regular rhythm, normal heart sounds and intact distal pulses. Exam reveals no gallop and no friction rub.  No murmur heard. Pulmonary/Chest: Effort normal and breath sounds normal. No stridor. No respiratory distress. She has no wheezes. She has no rales. She exhibits no tenderness.  Abdominal: No hernia.  Lymphadenopathy:    She has no cervical adenopathy.  Neurological: She is alert  and oriented to person, place, and time. She displays normal reflexes. No cranial nerve deficit or sensory deficit. She exhibits normal muscle tone. Coordination normal.  Skin: Skin is warm and dry. Capillary refill takes less than 2 seconds. She is not diaphoretic.  Psychiatric: She has a normal mood and affect. Her behavior is normal. Judgment and thought content normal.  Nursing note  and vitals reviewed.  Assessment/Plan:   1. Encounter to establish care - Follow up in February for CPE  - Follow up sooner if needed  2. Hypothyroidism, unspecified type - Consider dose change  - T3, Free - T4, Free - TSH  3. Anxiety  - citalopram (CELEXA) 10 MG tablet; Take 0.5 tablets (5 mg total) by mouth daily.  Dispense: 30 tablet; Refill: 0  4. Essential hypertension - No change in medications   [name], NP

## 2018-03-06 ENCOUNTER — Encounter: Payer: Self-pay | Admitting: Family Medicine

## 2018-03-12 ENCOUNTER — Other Ambulatory Visit: Payer: Self-pay | Admitting: Allergy and Immunology

## 2018-03-12 NOTE — Telephone Encounter (Signed)
RF for Arnuity given x 90 with 0 refills at [facility]

## 2018-04-14 ENCOUNTER — Ambulatory Visit: Payer: PPO | Admitting: Adult Health

## 2018-04-15 DIAGNOSIS — W57XXXA Bitten or stung by nonvenomous insect and other nonvenomous arthropods, initial encounter: Secondary | ICD-10-CM | POA: Diagnosis not present

## 2018-04-15 DIAGNOSIS — S30860A Insect bite (nonvenomous) of lower back and pelvis, initial encounter: Secondary | ICD-10-CM | POA: Diagnosis not present

## 2018-04-28 ENCOUNTER — Ambulatory Visit: Payer: PPO | Admitting: Allergy and Immunology

## 2018-04-28 ENCOUNTER — Encounter: Payer: Self-pay | Admitting: Allergy and Immunology

## 2018-04-28 VITALS — BP 108/64 | HR 68 | Resp 16

## 2018-04-28 DIAGNOSIS — J3089 Other allergic rhinitis: Secondary | ICD-10-CM

## 2018-04-28 DIAGNOSIS — K219 Gastro-esophageal reflux disease without esophagitis: Secondary | ICD-10-CM

## 2018-04-28 DIAGNOSIS — J4540 Moderate persistent asthma, uncomplicated: Secondary | ICD-10-CM | POA: Diagnosis not present

## 2018-04-28 MED ORDER — FLUTICASONE FUROATE-VILANTEROL 200-25 MCG/INH IN AEPB: 1.0000 | INHALATION_SPRAY | Freq: Every day | RESPIRATORY_TRACT | 1 refills | Status: DC

## 2018-04-28 NOTE — Patient Instructions (Signed)
    1. Continue to Treat inflammation:   A. Replace Arnuity with Breo 200 - one inhalation daily  B. OTC Rhinocort one spray each nostril Monday - FRIDAY  2. Continue to Treat reflux:   A. minimize caffeine and chocolate    B. ranitidine 300 mg in PM  3. If needed:   A. nasal saline wash  B. ProAir HFA 2 puffs every 4-6 hours  C. OTC antihistamine - Claritin/Allegra/Zyrtec  D. OTC Mucinex DM 2 tablets twice a day  4. Return to clinic in 4 weeks or earlier if problem

## 2018-04-28 NOTE — Progress Notes (Signed)
Follow-up Note  Referring Provider: Leighton Ruff, MD Primary Provider: [name], NP Date of Office Visit: 04/28/2018  Subjective:   Amber Brewer (DOB: [birth year]) is a 67 y.o. female who returns to the [facility] on 04/28/2018 in re-evaluation of the following:  HPI: [name] returns to this clinic in reevaluation of asthma and allergic rhinitis and LPR.  Her last visit to this clinic was 02 December 2017.  During the interval she has done relatively well without a requirement for systemic steroid or antibiotic to treat any type of respiratory tract issue and she rarely used a short acting bronchodilator and she could exercise without any difficulty.  Unfortunately, for about the past month she has been developing some cough in the morning and a little bit of nasal drip and some shortness of breath when she exerts herself.  It appears as though heat exposure is the major issue for this problem.  She cannot really go outside during the daytime when it is hot and must rely on walking her dog only in the morning.  This issue has been occurring while she has been consistently using Arnuity and Rhinocort.  Her reflux is under pretty good control.  She might have a little bit more mucus in her throat recently but overall her classic reflux issue is still under good control with ranitidine.  Allergies as of 04/28/2018      Reactions   Codeine Phosphate Nausea And Vomiting   REACTION: unspecified   Prednisone Shortness Of Breath   Sulfa Antibiotics Nausea And Vomiting   Iodine Rash   Broke out on skin once years ago      Medication List      ARNUITY ELLIPTA 100 MCG/ACT Aepb Generic drug:  Fluticasone Furoate INHALE 1 PUFF INTO LUNGS ONCE DAILY(RINSE AND SPIT OUT AFTER USE)   carvedilol 12.5 MG tablet Commonly known as:  COREG TAKE ONE TABLET BY MOUTH TWICE DAILY   cholecalciferol 1000 units tablet Commonly known as:  VITAMIN D Take 5,000 Units by  mouth daily.   citalopram 10 MG tablet Commonly known as:  CELEXA Take 0.5 tablets (5 mg total) by mouth daily.   cycloSPORINE 0.05 % ophthalmic emulsion Commonly known as:  RESTASIS Place 1 drop into both eyes 2 (two) times daily.   Fish Oil 1000 MG Caps Take 1 capsule by mouth daily.   Flax Seed Oil 1000 MG Caps Take 1 capsule by mouth daily.   hydrochlorothiazide 25 MG tablet Commonly known as:  HYDRODIURIL TAKE 1 TABLET BY MOUTH ONCE DAILY FOR BLOOD PRESSURE   irbesartan 150 MG tablet Commonly known as:  AVAPRO Take 1 tablet (150 mg total) by mouth daily.   levothyroxine 50 MCG tablet Commonly known as:  SYNTHROID, LEVOTHROID Take 50 mcg by mouth daily before breakfast.   potassium chloride 10 MEQ tablet Commonly known as:  K-DUR Take 1 tablet (10 mEq total) by mouth daily.   PROAIR HFA IN Inhale into the lungs.   ranitidine 300 MG tablet Commonly known as:  ZANTAC TAKE 1 TABLET BY MOUTH ONCE DAILY   RHINOCORT ALLERGY 32 MCG/ACT nasal spray Generic drug:  budesonide Place 1 spray into both nostrils daily as needed for allergies.   Zinc 100 MG Tabs Take 1 tablet by mouth daily.       Past Medical History:  Diagnosis Date  . Asthma   . Chicken pox   . DDD (degenerative disc disease), cervical   . Depression   .  Gallstones   . GERD (gastroesophageal reflux disease)   . Hx of melanoma in situ 2002  . Hx: UTI (urinary tract infection)   . Hyperlipidemia   . Hypertension   . Hypothyroidism   . IBS (irritable bowel syndrome)   . Melanoma ([place])    x2   . Nephrolithiasis   . Osteoarthritis   . PVC's (premature ventricular contractions)   . Status post dilation of esophageal narrowing     Past Surgical History:  Procedure Laterality Date  . ABDOMINAL HYSTERECTOMY  1974  . APPENDECTOMY  1977  . BLADDER SURGERY  1974  . BREAST LUMPECTOMY Right 2011  . CATARACT EXTRACTION Bilateral 2015  . CHOLECYSTECTOMY  1987  . removal of melanoma in situ       Review of systems negative except as noted in HPI / PMHx or noted below:  Review of Systems  Constitutional: Negative.   HENT: Negative.   Eyes: Negative.   Respiratory: Negative.   Cardiovascular: Negative.   Gastrointestinal: Negative.   Genitourinary: Negative.   Musculoskeletal: Negative.   Skin: Negative.   Neurological: Negative.   Endo/Heme/Allergies: Negative.   Psychiatric/Behavioral: Negative.      Objective:   Vitals:   04/28/18 1059  BP: 108/64  Pulse: 68  Resp: 16          Physical Exam  HENT:  Head: Normocephalic.  Right Ear: Tympanic membrane, external ear and ear canal normal.  Left Ear: Tympanic membrane, external ear and ear canal normal.  Nose: Nose normal. No mucosal edema or rhinorrhea.  Mouth/Throat: Uvula is midline, oropharynx is clear and moist and mucous membranes are normal. No oropharyngeal exudate.  Eyes: Conjunctivae are normal.  Neck: Trachea normal. No tracheal tenderness present. No tracheal deviation present. No thyromegaly present.  Cardiovascular: Normal rate, regular rhythm, S1 normal, S2 normal and normal heart sounds.  No murmur heard. Pulmonary/Chest: Breath sounds normal. No stridor. No respiratory distress. She has no wheezes. She has no rales.  Musculoskeletal: She exhibits no edema.  Lymphadenopathy:       Head (right side): No tonsillar adenopathy present.       Head (left side): No tonsillar adenopathy present.    She has no cervical adenopathy.  Neurological: She is alert.  Skin: No rash noted. She is not diaphoretic. No erythema. Nails show no clubbing.    Diagnostics:    Spirometry was performed and demonstrated an FEV1 of 1.83 at 77 % of predicted.  The patient had an Asthma Control Test with the following results: ACT Total Score: 16.    Assessment and Plan:   1. Not well controlled moderate persistent asthma   2. Other allergic rhinitis   3. LPRD (laryngopharyngeal reflux disease)      1. Continue  to Treat inflammation:   A. Replace Arnuity with Breo 200 - one inhalation daily  B. OTC Rhinocort one spray each nostril Monday - FRIDAY  2. Continue to Treat reflux:   A. minimize caffeine and chocolate    B. ranitidine 300 mg in PM  3. If needed:   A. nasal saline wash  B. ProAir HFA 2 puffs every 4-6 hours  C. OTC antihistamine - Claritin/Allegra/Zyrtec  D. OTC Mucinex DM 2 tablets twice a day  4. Return to clinic in 4 weeks or earlier if problem  I will have Caleah start a combination inhaler with Breo to see if we can get her summertime respiratory tract symptoms under better control with this approach.  She will continue to utilize anti-inflammatory therapy for her upper airway and treatment directed against reflux as noted above.  I will regroup with her in 4 weeks to assess her response to this approach.  Allena Katz, MD Allergy / Immunology [place]

## 2018-04-29 ENCOUNTER — Encounter: Payer: Self-pay | Admitting: Allergy and Immunology

## 2018-05-05 ENCOUNTER — Ambulatory Visit: Payer: PPO | Admitting: Adult Health

## 2018-05-06 ENCOUNTER — Ambulatory Visit (INDEPENDENT_AMBULATORY_CARE_PROVIDER_SITE_OTHER): Payer: PPO | Admitting: Adult Health

## 2018-05-06 ENCOUNTER — Encounter: Payer: Self-pay | Admitting: Adult Health

## 2018-05-06 VITALS — BP 140/78 | Temp 98.1°F | Wt 172.0 lb

## 2018-05-06 DIAGNOSIS — E039 Hypothyroidism, unspecified: Secondary | ICD-10-CM | POA: Diagnosis not present

## 2018-05-06 DIAGNOSIS — F419 Anxiety disorder, unspecified: Secondary | ICD-10-CM

## 2018-05-06 DIAGNOSIS — R7303 Prediabetes: Secondary | ICD-10-CM

## 2018-05-06 LAB — POCT GLYCOSYLATED HEMOGLOBIN (HGB A1C): Hemoglobin A1C: 5.7 % — AB (ref 4.0–5.6)

## 2018-05-06 LAB — T3, FREE: T3, Free: 2.6 pg/mL (ref 2.3–4.2)

## 2018-05-06 LAB — T4, FREE: FREE T4: 0.76 ng/dL (ref 0.60–1.60)

## 2018-05-06 LAB — TSH: TSH: 6.15 u[IU]/mL — ABNORMAL HIGH (ref 0.35–4.50)

## 2018-05-06 MED ORDER — CITALOPRAM HYDROBROMIDE 10 MG PO TABS: 5.0000 mg | ORAL_TABLET | Freq: Every day | ORAL | 1 refills | Status: DC

## 2018-05-06 NOTE — Progress Notes (Signed)
Subjective:    Patient ID: Amber Brewer, female    DOB: Jul 14, 1951, 67 y.o.   MRN: [id number]  HPI  67 year old female who  has a past medical history of Asthma, Chicken pox, DDD (degenerative disc disease), cervical, Depression, Gallstones, GERD (gastroesophageal reflux disease), melanoma in situ (2002), UTI (urinary tract infection), Hyperlipidemia, Hypertension, Hypothyroidism, IBS (irritable bowel syndrome), Melanoma ([place]), Nephrolithiasis, Osteoarthritis, PVC's (premature ventricular contractions), and Status post dilation of esophageal narrowing.   She presents to the office today for one-month follow-up regarding anxiety.  In the last visit she expressed anxiety that was stemming from having to work a full-time job, taking care of the needs of her elderly mother and taking care of her husband.  She had been experiencing anxiety over the previous few months but has been slowly becoming worse.  She eventually sought help when she found herself not sleeping well at night and often waking up in the middle of the night and unable to get back to bed because of racing thoughts.  Viewed options for therapy and ultimately decided on trying Celexa 5 mg. She reports that since starting this medication she has felt much improved. She feels less anxious and is happier. She denies any side effects of this medication. She would like to continue the current dose   During her last visit she also reported that her previous primary care had been diagnosed with " pre diabetes" as her A1c was 6.3. She has been working on diet and has cut out sugars and carbs. She has been able to lose two pounds since the last visit.   Wt Readings from Last 3 Encounters:  05/06/18 172 lb (78 kg)  02/17/18 174 lb (78.9 kg)  12/02/17 179 lb (81.2 kg)   Additionally, during the last visit her labs showed that she was over medicated with synthroid. Her dose was cut to 25 mcg. She does report feeling " better" since the  decrease in medication. We will recheck labs today   Review of Systems See HPI   Past Medical History:  Diagnosis Date  . Asthma   . Chicken pox   . DDD (degenerative disc disease), cervical   . Depression   . Gallstones   . GERD (gastroesophageal reflux disease)   . Hx of melanoma in situ 2002  . Hx: UTI (urinary tract infection)   . Hyperlipidemia   . Hypertension   . Hypothyroidism   . IBS (irritable bowel syndrome)   . Melanoma ([place])    x2   . Nephrolithiasis   . Osteoarthritis   . PVC's (premature ventricular contractions)   . Status post dilation of esophageal narrowing     Social History   Socioeconomic History  . Marital status: Married    Spouse name: Not on file  . Number of children: 2  . Years of education: 26  . Highest education level: Not on file  Occupational History  . Occupation: licensed Engineer, site: OTHER    Comment: Office manager  Social Needs  . Financial resource strain: Not on file  . Food insecurity:    Worry: Not on file    Inability: Not on file  . Transportation needs:    Medical: Not on file    Non-medical: Not on file  Tobacco Use  . Smoking status: Never Smoker  . Smokeless tobacco: Never Used  Substance and Sexual Activity  . Alcohol use: No  . Drug use: No  .  Sexual activity: Yes    Birth control/protection: Post-menopausal  Lifestyle  . Physical activity:    Days per week: Not on file    Minutes per session: Not on file  . Stress: Not on file  Relationships  . Social connections:    Talks on phone: Not on file    Gets together: Not on file    Attends religious service: Not on file    Active member of club or organization: Not on file    Attends meetings of clubs or organizations: Not on file    Relationship status: Not on file  . Intimate partner violence:    Fear of current or ex partner: Not on file    Emotionally abused: Not on file    Physically abused: Not on file    Forced sexual  activity: Not on file  Other Topics Concern  . Not on file  Social History Narrative   epworth sleepiness scale = 8 (01/18/2016)    Past Surgical History:  Procedure Laterality Date  . ABDOMINAL HYSTERECTOMY  1974  . APPENDECTOMY  1977  . BLADDER SURGERY  1974  . BREAST LUMPECTOMY Right 2011  . CATARACT EXTRACTION Bilateral 2015  . CHOLECYSTECTOMY  1987  . removal of melanoma in situ      Family History  Problem Relation Age of Onset  . Hypertension Mother   . Thyroid disease Mother   . Arthritis Mother   . Depression Mother   . Hearing loss Mother   . Miscarriages / [place] Mother   . Heart attack Father   . Heart disease Father   . Alcohol abuse Father   . Arthritis Father   . Depression Father   . Drug abuse Father   . Hypertension Father   . Alcohol abuse Sister   . Arthritis Sister   . Depression Sister   . Hearing loss Sister   . Hypotension Sister   . Hypertension Maternal Aunt   . Hypertension Maternal Uncle   . Clotting disorder Maternal Uncle   . Prostate cancer Maternal Uncle   . Colon cancer Maternal Uncle   . Colon polyps Maternal Uncle   . Diabetes Maternal Uncle        all uncles  . Arthritis Maternal Grandmother   . Asthma Maternal Grandmother   . Breast cancer Maternal Grandmother   . Stomach cancer Maternal Grandmother   . Hearing loss Maternal Grandmother   . Diabetes Maternal Grandfather   . Heart disease Maternal Grandfather   . Arthritis Maternal Grandfather   . Hearing loss Maternal Grandfather   . Hypertension Maternal Grandfather   . Kidney disease Maternal Grandfather   . Hyperlipidemia Paternal Grandmother        also MI  . Breast cancer Paternal Grandmother   . Arthritis Paternal Grandmother   . Heart attack Paternal Grandmother   . Hyperlipidemia Paternal Grandfather        also MI  . Heart disease Paternal Grandfather   . Heart attack Paternal Grandfather   . Depression Sister   . Heart disease Sister   .  Hyperlipidemia Sister   . Hypertension Son   . Endometriosis Daughter     Allergies  Allergen Reactions  . Codeine Phosphate Nausea And Vomiting    REACTION: unspecified  . Prednisone Shortness Of Breath  . Sulfa Antibiotics Nausea And Vomiting  . Iodine Rash    Broke out on skin once years ago    Current Outpatient Medications on File  Prior to Visit  Medication Sig Dispense Refill  . Albuterol Sulfate (PROAIR HFA IN) Inhale into the lungs.    . ARNUITY ELLIPTA 100 MCG/ACT AEPB INHALE 1 PUFF INTO LUNGS ONCE DAILY(RINSE AND SPIT OUT AFTER USE) 90 each 0  . budesonide (RHINOCORT ALLERGY) 32 MCG/ACT nasal spray Place 1 spray into both nostrils daily as needed for allergies.     . carvedilol (COREG) 12.5 MG tablet TAKE ONE TABLET BY MOUTH TWICE DAILY (Patient taking differently: TAKE ONE TABLET IN THE MORNING AND 1/2 TAB AT NIGHT) 180 tablet 3  . cholecalciferol (VITAMIN D) 1000 UNITS tablet Take 5,000 Units by mouth daily.     . citalopram (CELEXA) 10 MG tablet Take 0.5 tablets (5 mg total) by mouth daily. 30 tablet 0  . cycloSPORINE (RESTASIS) 0.05 % ophthalmic emulsion Place 1 drop into both eyes 2 (two) times daily.     . Flaxseed, Linseed, (FLAX SEED OIL) 1000 MG CAPS Take 1 capsule by mouth daily.     . fluticasone furoate-vilanterol (BREO ELLIPTA) 200-25 MCG/INH AEPB Inhale 1 puff into the lungs daily. 28 each 1  . hydrochlorothiazide 25 MG tablet TAKE 1 TABLET BY MOUTH ONCE DAILY FOR BLOOD PRESSURE 30 tablet 11  . irbesartan (AVAPRO) 150 MG tablet Take 1 tablet (150 mg total) by mouth daily. 90 tablet 3  . levothyroxine (SYNTHROID, LEVOTHROID) 50 MCG tablet Take 50 mcg by mouth daily before breakfast.    . Omega-3 Fatty Acids (FISH OIL) 1000 MG CAPS Take 1 capsule by mouth daily.     . potassium chloride (K-DUR) 10 MEQ tablet Take 1 tablet (10 mEq total) by mouth daily. 90 tablet 3  . ranitidine (ZANTAC) 300 MG tablet TAKE 1 TABLET BY MOUTH ONCE DAILY 90 tablet 1  . Zinc 100 MG  TABS Take 1 tablet by mouth daily.     No current facility-administered medications on file prior to visit.     There were no vitals taken for this visit.      Objective:   Physical Exam  Constitutional: She is oriented to person, place, and time. She appears well-developed and well-nourished. No distress.  Cardiovascular: Normal rate, regular rhythm, normal heart sounds and intact distal pulses. Exam reveals no gallop and no friction rub.  No murmur heard. Pulmonary/Chest: Effort normal and breath sounds normal. No stridor. No respiratory distress. She has no wheezes. She has no rales. She exhibits no tenderness.  Neurological: She is alert and oriented to person, place, and time.  Skin: Skin is warm and dry. She is not diaphoretic.  Psychiatric: She has a normal mood and affect. Her behavior is normal. Judgment and thought content normal.  Nursing note and vitals reviewed.     Assessment & Plan:  1. Pre-diabetes - Continue with lifestyle modifications  - POCT A1C - 5.7   2. Anxiety  - citalopram (CELEXA) 10 MG tablet; Take 0.5 tablets (5 mg total) by mouth daily.  Dispense: 45 tablet; Refill: 1  3. Hypothyroidism, unspecified type  - TSH - T3, Free - T4, Free   [name], NP

## 2018-05-20 ENCOUNTER — Telehealth: Payer: Self-pay | Admitting: Adult Health

## 2018-05-20 NOTE — Telephone Encounter (Signed)
Copied from [facility] [id number]. Topic: Quick Communication - See Telephone Encounter >> May 20, 2018 11:56 AM Ahmed Prima L wrote: CRM for notification. See Telephone encounter for: 05/20/18. Patient states that she would like to start back on estradiol (VIVELLE-DOT) 0.05 MG/24HR patch [place] ([facility] [place] [address] [place] ([place]) [place] [address] Phone: [phone] Fax: [phone]

## 2018-05-21 MED ORDER — ESTRADIOL 0.05 MG/24HR TD PTTW: 1.0000 | MEDICATED_PATCH | TRANSDERMAL | 1 refills | Status: DC

## 2018-05-21 NOTE — Telephone Encounter (Signed)
Office visit for this med?

## 2018-05-21 NOTE — Telephone Encounter (Signed)
Sent to the pharmacy by e-scribe. Pt notified.

## 2018-05-21 NOTE — Telephone Encounter (Signed)
Ok to send in medication?   

## 2018-05-21 NOTE — Telephone Encounter (Signed)
Pt states she was just seen 7/17 and thought she would come off this med, but that is not working for her. Pt states she works 10 hour days. Having hot flashes, so cannot do this right now.  Pt states she discussed this info with Tommi Rumps.  Hopes not to have to come back in so soon.  Please call [phone] Ok to leave message on this line

## 2018-05-21 NOTE — Telephone Encounter (Addendum)
Left a message for a return call.  CRM created. 

## 2018-05-27 ENCOUNTER — Ambulatory Visit: Payer: PPO | Admitting: Allergy and Immunology

## 2018-05-27 ENCOUNTER — Encounter: Payer: Self-pay | Admitting: Allergy and Immunology

## 2018-05-27 VITALS — BP 138/72 | HR 62 | Resp 17

## 2018-05-27 DIAGNOSIS — K219 Gastro-esophageal reflux disease without esophagitis: Secondary | ICD-10-CM | POA: Diagnosis not present

## 2018-05-27 DIAGNOSIS — J4540 Moderate persistent asthma, uncomplicated: Secondary | ICD-10-CM | POA: Diagnosis not present

## 2018-05-27 DIAGNOSIS — J3089 Other allergic rhinitis: Secondary | ICD-10-CM

## 2018-05-27 MED ORDER — ALBUTEROL SULFATE HFA 108 (90 BASE) MCG/ACT IN AERS: 1.0000 | INHALATION_SPRAY | RESPIRATORY_TRACT | 1 refills | Status: DC | PRN

## 2018-05-27 NOTE — Progress Notes (Signed)
Follow-up Note  Referring Provider: [name], NP Primary Provider: [name], NP Date of Office Visit: 05/27/2018  Subjective:   Amber Brewer (DOB: 03-22-1951) is a 67 y.o. female who returns to the [facility] on 05/27/2018 in re-evaluation of the following:  HPI: Amber Brewer returns to this clinic in reevaluation of her asthma and allergic rhinitis and LPR.  Her last visit to this clinic was 28 April 2018.  During her last visit we gave her Memory Dance but Memory Dance made her cough and made her arms feel funny so she reverted back to using Arnuity and for the most part has done very well with her asthma.  Certainly on hot days and humid days she does have a little bit of problem with breathing but rarely uses her short acting bronchodilator and she can still exercise without difficulty.  Likewise her nose has been doing relatively well.  She continues to use some Rhinocort.  Her reflux and her throat have been doing very well while using ranitidine and being careful about caffeine and chocolate.  Allergies as of 05/27/2018      Reactions   Codeine Phosphate Nausea And Vomiting   REACTION: unspecified   Prednisone Shortness Of Breath   Sulfa Antibiotics Nausea And Vomiting   Iodine Rash   Broke out on skin once years ago      Medication List      carvedilol 12.5 MG tablet Commonly known as:  COREG TAKE ONE TABLET BY MOUTH TWICE DAILY   cholecalciferol 1000 units tablet Commonly known as:  VITAMIN D Take 5,000 Units by mouth daily.   citalopram 10 MG tablet Commonly known as:  CELEXA Take 0.5 tablets (5 mg total) by mouth daily.   cycloSPORINE 0.05 % ophthalmic emulsion Commonly known as:  RESTASIS Place 1 drop into both eyes 2 (two) times daily.   estradiol 0.05 MG/24HR patch Commonly known as:  VIVELLE-DOT Place 1 patch (0.05 mg total) onto the skin 2 (two) times a week.   Fish Oil 1000 MG Caps Take 1 capsule by mouth daily.   Flax Seed Oil  1000 MG Caps Take 1 capsule by mouth daily.   hydrochlorothiazide 25 MG tablet Commonly known as:  HYDRODIURIL TAKE 1 TABLET BY MOUTH ONCE DAILY FOR BLOOD PRESSURE   irbesartan 150 MG tablet Commonly known as:  AVAPRO Take 1 tablet (150 mg total) by mouth daily.   levothyroxine 50 MCG tablet Commonly known as:  SYNTHROID, LEVOTHROID Take 50 mcg by mouth daily before breakfast.   potassium chloride 10 MEQ tablet Commonly known as:  K-DUR Take 1 tablet (10 mEq total) by mouth daily.   PROAIR HFA IN Inhale into the lungs.   ranitidine 300 MG tablet Commonly known as:  ZANTAC TAKE 1 TABLET BY MOUTH ONCE DAILY   RHINOCORT ALLERGY 32 MCG/ACT nasal spray Generic drug:  budesonide Place 1 spray into both nostrils daily as needed for allergies.   TURMERIC PO Take by mouth.   Zinc 100 MG Tabs Take 1 tablet by mouth daily.       Past Medical History:  Diagnosis Date  . Asthma   . Chicken pox   . DDD (degenerative disc disease), cervical   . Depression   . Gallstones   . GERD (gastroesophageal reflux disease)   . Hx of melanoma in situ 2002  . Hx: UTI (urinary tract infection)   . Hyperlipidemia   . Hypertension   . Hypothyroidism   .  IBS (irritable bowel syndrome)   . Melanoma ([place])    x2   . Nephrolithiasis   . Osteoarthritis   . PVC's (premature ventricular contractions)   . Status post dilation of esophageal narrowing     Past Surgical History:  Procedure Laterality Date  . ABDOMINAL HYSTERECTOMY  1974  . APPENDECTOMY  1977  . BLADDER SURGERY  1974  . BREAST LUMPECTOMY Right 2011  . CATARACT EXTRACTION Bilateral 2015  . CHOLECYSTECTOMY  1987  . removal of melanoma in situ      Review of systems negative except as noted in HPI / PMHx or noted below:  Review of Systems  Constitutional: Negative.   HENT: Negative.   Eyes: Negative.   Respiratory: Negative.   Cardiovascular: Negative.   Gastrointestinal: Negative.   Genitourinary: Negative.     Musculoskeletal: Negative.   Skin: Negative.   Neurological: Negative.   Endo/Heme/Allergies: Negative.   Psychiatric/Behavioral: Negative.      Objective:   Vitals:   05/27/18 1043  BP: 138/72  Pulse: 62  Resp: 17          Physical Exam  HENT:  Head: Normocephalic.  Right Ear: Tympanic membrane, external ear and ear canal normal.  Left Ear: Tympanic membrane, external ear and ear canal normal.  Nose: Nose normal. No mucosal edema or rhinorrhea.  Mouth/Throat: Uvula is midline, oropharynx is clear and moist and mucous membranes are normal. No oropharyngeal exudate.  Eyes: Conjunctivae are normal.  Neck: Trachea normal. No tracheal tenderness present. No tracheal deviation present. No thyromegaly present.  Cardiovascular: Normal rate, regular rhythm, S1 normal, S2 normal and normal heart sounds.  No murmur heard. Pulmonary/Chest: Breath sounds normal. No stridor. No respiratory distress. She has no wheezes. She has no rales.  Musculoskeletal: She exhibits no edema.  Lymphadenopathy:       Head (right side): No tonsillar adenopathy present.       Head (left side): No tonsillar adenopathy present.    She has no cervical adenopathy.  Neurological: She is alert.  Skin: No rash noted. She is not diaphoretic. No erythema. Nails show no clubbing.    Diagnostics:    Spirometry was performed and demonstrated an FEV1 of 1.72 at 75 % of predicted.  The patient had an Asthma Control Test with the following results: ACT Total Score: 19.    Assessment and Plan:   1. Asthma, moderate persistent, well-controlled   2. Other allergic rhinitis   3. LPRD (laryngopharyngeal reflux disease)      1. Continue to Treat inflammation:   A. Arnuity 200 - one inhalation daily  B. OTC Rhinocort one spray each nostril 3-7 times a week  2. Continue to Treat reflux:   A. minimize caffeine and chocolate    B. ranitidine 300 mg in PM  3. If needed:   A. nasal saline wash  B. ProAir  HFA 2 puffs every 4-6 hours  C. OTC antihistamine - Claritin/Allegra/Zyrtec  D. OTC Mucinex DM 2 tablets twice a day  4. Return to clinic in January 2020 or earlier if problem  5. Obtain fall flu vaccine  Amber Brewer appears to be doing okay at this point in time and she is satisfied with the response that she has received on her current therapy directed against respiratory tract inflammation and reflux.  We will keep her on the medications noted above and I will see her back in his clinic in 6 months or earlier if there is a problem.  Randall Hiss  Neldon Mc, MD Allergy / Immunology [place]

## 2018-05-27 NOTE — Patient Instructions (Addendum)
    1. Continue to Treat inflammation:   A. Arnuity 200 - one inhalation daily  B. OTC Rhinocort one spray each nostril 3-7 times a week  2. Continue to Treat reflux:   A. minimize caffeine and chocolate    B. ranitidine 300 mg in PM  3. If needed:   A. nasal saline wash  B. ProAir HFA 2 puffs every 4-6 hours  C. OTC antihistamine - Claritin/Allegra/Zyrtec  D. OTC Mucinex DM 2 tablets twice a day  4. Return to clinic in January 2020 or earlier if problem  5. Obtain fall flu vaccine

## 2018-05-28 ENCOUNTER — Encounter: Payer: Self-pay | Admitting: Allergy and Immunology

## 2018-06-03 ENCOUNTER — Telehealth: Payer: Self-pay | Admitting: Neurology

## 2018-06-03 ENCOUNTER — Encounter: Payer: Self-pay | Admitting: Neurology

## 2018-06-03 ENCOUNTER — Ambulatory Visit: Payer: PPO | Admitting: Neurology

## 2018-06-03 VITALS — BP 137/82 | HR 63 | Ht 64.0 in | Wt 173.0 lb

## 2018-06-03 DIAGNOSIS — M7918 Myalgia, other site: Secondary | ICD-10-CM | POA: Diagnosis not present

## 2018-06-03 DIAGNOSIS — I720 Aneurysm of carotid artery: Secondary | ICD-10-CM | POA: Diagnosis not present

## 2018-06-03 DIAGNOSIS — R42 Dizziness and giddiness: Secondary | ICD-10-CM | POA: Diagnosis not present

## 2018-06-03 DIAGNOSIS — H93A3 Pulsatile tinnitus, bilateral: Secondary | ICD-10-CM | POA: Diagnosis not present

## 2018-06-03 DIAGNOSIS — H9190 Unspecified hearing loss, unspecified ear: Secondary | ICD-10-CM | POA: Diagnosis not present

## 2018-06-03 NOTE — Addendum Note (Signed)
Addended by: Sarina Ill B on: 06/03/2018 11:15 AM   Modules accepted: Orders
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
## 2018-06-03 NOTE — Patient Instructions (Signed)
CTA of the head and neck Referral to Ear,Nose and Throat Dr. Ernesto Rutherford   Dizziness Dizziness is a common problem. It is a feeling of unsteadiness or light-headedness. You may feel like you are about to faint. Dizziness can lead to injury if you stumble or fall. Anyone can become dizzy, but dizziness is more common in older adults. This condition can be caused by a number of things, including medicines, dehydration, or illness. Follow these instructions at home: Eating and drinking  Drink enough fluid to keep your urine clear or pale yellow. This helps to keep you from becoming dehydrated. Try to drink more clear fluids, such as water.  Do not drink alcohol.  Limit your caffeine intake if told to do so by your health care provider. Check ingredients and nutrition facts to see if a food or beverage contains caffeine.  Limit your salt (sodium) intake if told to do so by your health care provider. Check ingredients and nutrition facts to see if a food or beverage contains sodium. Activity  Avoid making quick movements. ? Rise slowly from chairs and steady yourself until you feel okay. ? In the morning, first sit up on the side of the bed. When you feel okay, stand slowly while you hold onto something until you know that your balance is fine.  If you need to stand in one place for a long time, move your legs often. Tighten and relax the muscles in your legs while you are standing.  Do not drive or use heavy machinery if you feel dizzy.  Avoid bending down if you feel dizzy. Place items in your home so that they are easy for you to reach without leaning over. Lifestyle  Do not use any products that contain nicotine or tobacco, such as cigarettes and e-cigarettes. If you need help quitting, ask your health care provider.  Try to reduce your stress level by using methods such as yoga or meditation. Talk with your health care provider if you need help to manage your stress. General  instructions  Watch your dizziness for any changes.  Take over-the-counter and prescription medicines only as told by your health care provider. Talk with your health care provider if you think that your dizziness is caused by a medicine that you are taking.  Tell a friend or a family member that you are feeling dizzy. If he or she notices any changes in your behavior, have this person call your health care provider.  Keep all follow-up visits as told by your health care provider. This is important. Contact a health care provider if:  Your dizziness does not go away.  Your dizziness or light-headedness gets worse.  You feel nauseous.  You have reduced hearing.  You have new symptoms.  You are unsteady on your feet or you feel like the room is spinning. Get help right away if:  You vomit or have diarrhea and are unable to eat or drink anything.  You have problems talking, walking, swallowing, or using your arms, hands, or legs.  You feel generally weak.  You are not thinking clearly or you have trouble forming sentences. It may take a friend or family member to notice this.  You have chest pain, abdominal pain, shortness of breath, or sweating.  Your vision changes.  You have any bleeding.  You have a severe headache.  You have neck pain or a stiff neck.  You have a fever. These symptoms may represent a serious problem that is an  emergency. Do not wait to see if the symptoms will go away. Get medical help right away. Call your local emergency services (911 in the [place] Do not drive yourself to the hospital. Summary  Dizziness is a feeling of unsteadiness or light-headedness. This condition can be caused by a number of things, including medicines, dehydration, or illness.  Anyone can become dizzy, but dizziness is more common in older adults.  Drink enough fluid to keep your urine clear or pale yellow. Do not drink alcohol.  Avoid making quick movements if you feel  dizzy. Monitor your dizziness for any changes. This information is not intended to replace advice given to you by your health care provider. Make sure you discuss any questions you have with your health care provider. Document Released: 04/02/2001 Document Revised: 11/09/2016 Document Reviewed: 11/09/2016 Elsevier Interactive Patient Education  2018 [employer].   Cerebral Aneurysm An aneurysm is the bulging or ballooning out of part of the weakened wall of a vein or artery. An aneurysm in the vein or artery of the brain is called a brain aneurysm, or cerebral aneurysm. Aneurysms are a risk to your health because they may leak or rupture. Once the aneurysm leaks or ruptures, bleeding occurs. If the bleeding occurs within the brain tissue, the condition is called an intracerebral hemorrhage. An intracerebral hemorrhage can result in a hemorrhagic stroke. If the bleeding occurs in the area between the brain and the thin tissues that cover the brain, the condition is called a subarachnoid hemorrhage. This increases the pressure on the brain and causes some areas of the brain to not get the necessary blood flow. The blood from the ruptured aneurysm collects and presses on the surrounding brain tissue. A subarachnoid hemorrhage can cause a stroke. A ruptured cerebral aneurysm is a medical emergency. This can cause permanent damage and loss of brain function. What are the causes? A cerebral aneurysm is caused when a weakened part of the blood vessel expands. The blood vessel expands due to the constant pressure from the flow of blood through the weakened blood vessel. Usually the aneurysm expands slowly. As the weakened aneurysm expands, the walls of the aneurysm become weaker. Aneurysms may be associated with diseases that weaken and damage the walls of your blood vessels or blood vessels that develop abnormally. Some known causes for cerebral aneurysms are:  Head trauma.  Infection.  Use of  "recreational drugs" such as cocaine or amphetamines.  What increases the risk? People at risk for a cerebral aneurysm or hemorrhagic stroke usually have one or more risk factors, which include:  Having high blood pressure (hypertension).  Abusing alcohol.  Having abnormal blood vessels present since birth.  Having certain bleeding disorders, such as hemophilia, sickle cell disease, or liver disease.  Taking blood thinners (anticoagulants).  Smoking.  Having a family history of aneurysm.  What are the signs or symptoms? The signs and symptoms of an unruptured cerebral aneurysm will partly depend on its size and rate of growth. A small, unchanging aneurysm generally does not produce symptoms. A larger aneurysm that is steadily growing can increase pressure on the brain or nerves. That increased pressure from the unruptured cerebral aneurysm can cause:  A headache.  Problems with your vision.  Numbness or weakness in an arm or leg.  Problems with memory.  Problems speaking.  Seizures.  If an aneurysm leaks or bursts, it can cause a stroke and be life-threatening. Symptoms may include:  A sudden, severe headache with no known  cause. The headache is often described as the worst headache ever experienced.  Nausea or vomiting, especially when combined with other symptoms such as a headache.  Sudden weakness or numbness of the face, arm, or leg, especially on one side of the body.  Sudden trouble walking or difficulty moving arms or legs.  Sudden confusion.  Sudden personality changes.  Trouble speaking (aphasia) or understanding.  Difficulty swallowing.  Sudden trouble seeing in one or both eyes.  Double vision.  Dizziness.  Loss of balance or coordination.  Intolerance to light.  Stiff neck.  How is this diagnosed? Your health care provider may use one of the following tests to diagnose your aneurysm:  Computed tomographic angiography (CTA). This test  uses dye and a scanner to produce images of your blood vessels.  Magnetic resonance angiography (MRA). This test uses an MRI machine to produce images of your blood vessels.  Digital subtraction angiography (DSA). This test uses dye and X-rays to take images of your blood vessels. Your health care provider may use this test to help determine the best course of treatment.  How is this treated? Unruptured Aneurysms Treatment is complex when an aneurysm is found and it is not causing problems. Treatment is very individualized, as each case is different. Many things must be considered, such as the size and exact location of your aneurysm, your age, your overall health, and your feelings and preferences. Small aneurysms in certain locations of the brain have a very low chance of bleeding or rupturing. These small aneurysms may not be treated. However, depending on the size and location of the aneurysm, treatments may be recommended and include:  Coiling. During this procedure, a catheter is inserted and advanced through a blood vessel. Once the catheter reaches the aneurysm, tiny coils are used to block blood flow into the aneurysm.  Surgical clipping. During surgery, a clip is placed at the base of the aneurysm. The clip prevents blood from continuing to enter the aneurysm.  Flow diversion. This procedure is used to divert blood flow around the aneurysm.  Ruptured Aneurysms Immediate emergency surgery may be needed to help prevent damage to the brain and to reduce the risk of rebleeding. Timing of treatment is an important factor in the prevention of complications. Successful early treatment of a ruptured aneurysm (within the first 3 days of a bleed) helps to prevent rebleeding and blood vessel spasm. In some cases, there may be a reason to treat later (10-14 days after a rupture). Many things are considered when making this decision, and each case is handled individually. Follow these instructions at  home:  Take medicines only as instructed by your health care provider.  Eat healthy foods. It is recommended that you eat 5 or more servings of fruits and vegetables each day. Foods may need to be a special consistency (soft or pureed), or small bites may need to be taken if you have had a ruptured aneurysm or stroke. Certain dietary changes may be advised to address high blood pressure, high cholesterol, diabetes, or obesity. ? Food choices that are low in salt (sodium), saturated fat, trans fat, and cholesterol are recommended to manage high blood pressure. ? Food choices that are high in fiber and low in saturated fat, trans fat, and cholesterol are recommended to control cholesterol levels. ? Controlling carbohydrate and sugar intake is recommended to manage diabetes. ? Reducing calorie intake and making food choices that are low in sodium, saturated fat, trans fat, and cholesterol are  recommended to manage obesity.  Maintain a healthy weight.  Stay physically active. It is recommended that you get at least 30 minutes of activity on most or all days.  Do not smoke.  Limit alcohol use. Moderate alcohol use is considered to be: ? No more than 2 drinks each day for men. ? No more than 1 drink each day for nonpregnant women.  Stop drug abuse.  A safe home environment is important to reduce the risk of falls. Your health care provider may arrange for specialists to evaluate your home. Having grab bars in the bedroom and bathroom is often important. Your health care provider may arrange for special equipment to be used at home, such as raised toilets and a seat for the shower.  Physical, occupational, and speech therapy. Ongoing therapy may be needed to maximize your recovery after a ruptured aneurysm or stroke. If you have been advised to use a walker or a cane, use it at all times. Be sure to keep your therapy appointments.  Follow all instructions for follow-up with your health care  provider. This is very important. This includes any referrals, physical therapy, rehabilitation, and laboratory tests. Proper follow-up may prevent an aneurysm rupture or a stroke. Get help right away if:  You have a sudden, severe headache with no known cause.  You have sudden nausea or vomiting with a severe headache.  You have sudden weakness or numbness of the face, arm, or leg, especially on one side of the body.  You have sudden trouble walking or difficulty moving arms or legs.  You have sudden confusion.  You have trouble speaking or understanding.  You have sudden trouble seeing in one or both eyes.  You have a sudden loss of balance or coordination.  You have a stiff neck.  You have difficulty breathing.  You have a partial or total loss of consciousness. Any of these symptoms may represent a serious problem that is an emergency. Do not wait to see if the symptoms will go away. Get medical help at once. Call your local emergency services (911 in [place] Do not drive yourself to the hospital. This information is not intended to replace advice given to you by your health care provider. Make sure you discuss any questions you have with your health care provider. Document Released: 06/29/2002 Document Revised: 03/14/2016 Document Reviewed: 03/25/2013 Elsevier Interactive Patient Education  2017 [employer].

## 2018-06-03 NOTE — Telephone Encounter (Signed)
Health team order sent to GI. No auth they will reach out to the pt to schedule.  °

## 2018-06-04 LAB — BASIC METABOLIC PANEL
BUN/Creatinine Ratio: 17 (ref 12–28)
BUN: 16 mg/dL (ref 8–27)
CALCIUM: 10.3 mg/dL (ref 8.7–10.3)
CO2: 26 mmol/L (ref 20–29)
Chloride: 102 mmol/L (ref 96–106)
Creatinine, Ser: 0.95 mg/dL (ref 0.57–1.00)
GFR calc Af Amer: 72 mL/min/{1.73_m2} (ref >59)
GFR calc non Af Amer: 62 mL/min/{1.73_m2} (ref >59)
GLUCOSE: 89 mg/dL (ref 65–99)
POTASSIUM: 4.5 mmol/L (ref 3.5–5.2)
SODIUM: 143 mmol/L (ref 134–144)

## 2018-06-17 ENCOUNTER — Ambulatory Visit
Admission: RE | Admit: 2018-06-17 | Discharge: 2018-06-17 | Disposition: A | Discharge status: Home / Self Care | Payer: PPO | Source: Ambulatory Visit | Attending: Neurology | Admitting: Neurology

## 2018-06-17 DIAGNOSIS — H9190 Unspecified hearing loss, unspecified ear: Secondary | ICD-10-CM

## 2018-06-17 DIAGNOSIS — I720 Aneurysm of carotid artery: Secondary | ICD-10-CM

## 2018-06-17 DIAGNOSIS — R42 Dizziness and giddiness: Secondary | ICD-10-CM

## 2018-06-17 DIAGNOSIS — H93A3 Pulsatile tinnitus, bilateral: Secondary | ICD-10-CM

## 2018-06-17 MED ORDER — IOPAMIDOL (ISOVUE-370) INJECTION 76%
75.0000 mL | Freq: Once | INTRAVENOUS | Status: AC | PRN
  Administered 2018-06-17: 75 mL via INTRAVENOUS

## 2018-06-19 ENCOUNTER — Other Ambulatory Visit: Payer: Self-pay | Admitting: Allergy and Immunology

## 2018-07-13 DIAGNOSIS — J301 Allergic rhinitis due to pollen: Secondary | ICD-10-CM | POA: Diagnosis not present

## 2018-07-13 DIAGNOSIS — H6122 Impacted cerumen, left ear: Secondary | ICD-10-CM | POA: Diagnosis not present

## 2018-07-13 DIAGNOSIS — H9311 Tinnitus, right ear: Secondary | ICD-10-CM | POA: Diagnosis not present

## 2018-07-13 DIAGNOSIS — H8143 Vertigo of central origin, bilateral: Secondary | ICD-10-CM | POA: Diagnosis not present

## 2018-07-13 DIAGNOSIS — J323 Chronic sphenoidal sinusitis: Secondary | ICD-10-CM | POA: Diagnosis not present

## 2018-07-13 DIAGNOSIS — H9313 Tinnitus, bilateral: Secondary | ICD-10-CM | POA: Diagnosis not present

## 2018-07-13 DIAGNOSIS — J3089 Other allergic rhinitis: Secondary | ICD-10-CM | POA: Diagnosis not present

## 2018-08-10 ENCOUNTER — Ambulatory Visit
Admission: RE | Admit: 2018-08-10 | Discharge: 2018-08-10 | Disposition: A | Discharge status: Home / Self Care | Payer: PPO | Source: Ambulatory Visit | Attending: Otolaryngology | Admitting: Otolaryngology

## 2018-08-10 ENCOUNTER — Other Ambulatory Visit: Payer: Self-pay | Admitting: Otolaryngology

## 2018-08-10 DIAGNOSIS — H04123 Dry eye syndrome of bilateral lacrimal glands: Secondary | ICD-10-CM | POA: Diagnosis not present

## 2018-08-10 DIAGNOSIS — J323 Chronic sphenoidal sinusitis: Secondary | ICD-10-CM

## 2018-08-10 DIAGNOSIS — H40033 Anatomical narrow angle, bilateral: Secondary | ICD-10-CM | POA: Diagnosis not present

## 2018-08-10 DIAGNOSIS — J0130 Acute sphenoidal sinusitis, unspecified: Secondary | ICD-10-CM | POA: Diagnosis not present

## 2018-08-10 DIAGNOSIS — H8110 Benign paroxysmal vertigo, unspecified ear: Secondary | ICD-10-CM | POA: Diagnosis not present

## 2018-08-17 ENCOUNTER — Other Ambulatory Visit: Payer: Self-pay | Admitting: Adult Health

## 2018-08-17 ENCOUNTER — Other Ambulatory Visit: Payer: Self-pay | Admitting: Allergy and Immunology

## 2018-08-17 DIAGNOSIS — Z1231 Encounter for screening mammogram for malignant neoplasm of breast: Secondary | ICD-10-CM

## 2018-08-22 DIAGNOSIS — H43393 Other vitreous opacities, bilateral: Secondary | ICD-10-CM | POA: Diagnosis not present

## 2018-08-24 DIAGNOSIS — J323 Chronic sphenoidal sinusitis: Secondary | ICD-10-CM | POA: Diagnosis not present

## 2018-09-09 DIAGNOSIS — Z23 Encounter for immunization: Secondary | ICD-10-CM | POA: Diagnosis not present

## 2018-09-09 DIAGNOSIS — Z87898 Personal history of other specified conditions: Secondary | ICD-10-CM | POA: Diagnosis not present

## 2018-09-09 DIAGNOSIS — Z85828 Personal history of other malignant neoplasm of skin: Secondary | ICD-10-CM | POA: Diagnosis not present

## 2018-09-09 DIAGNOSIS — D2361 Other benign neoplasm of skin of right upper limb, including shoulder: Secondary | ICD-10-CM | POA: Diagnosis not present

## 2018-09-09 DIAGNOSIS — D225 Melanocytic nevi of trunk: Secondary | ICD-10-CM | POA: Diagnosis not present

## 2018-09-09 DIAGNOSIS — D2271 Melanocytic nevi of right lower limb, including hip: Secondary | ICD-10-CM | POA: Diagnosis not present

## 2018-09-09 DIAGNOSIS — L821 Other seborrheic keratosis: Secondary | ICD-10-CM | POA: Diagnosis not present

## 2018-09-09 DIAGNOSIS — L719 Rosacea, unspecified: Secondary | ICD-10-CM | POA: Diagnosis not present

## 2018-09-09 DIAGNOSIS — Z86018 Personal history of other benign neoplasm: Secondary | ICD-10-CM | POA: Diagnosis not present

## 2018-09-09 DIAGNOSIS — D2371 Other benign neoplasm of skin of right lower limb, including hip: Secondary | ICD-10-CM | POA: Diagnosis not present

## 2018-09-09 DIAGNOSIS — Z808 Family history of malignant neoplasm of other organs or systems: Secondary | ICD-10-CM | POA: Diagnosis not present

## 2018-09-21 DIAGNOSIS — H10023 Other mucopurulent conjunctivitis, bilateral: Secondary | ICD-10-CM | POA: Diagnosis not present

## 2018-09-28 ENCOUNTER — Inpatient Hospital Stay: Admission: RE | Admit: 2018-09-28 | Payer: PPO | Source: Ambulatory Visit

## 2018-09-28 ENCOUNTER — Ambulatory Visit
Admission: RE | Admit: 2018-09-28 | Discharge: 2018-09-28 | Disposition: A | Discharge status: Home / Self Care | Payer: PPO | Source: Ambulatory Visit | Attending: Adult Health | Admitting: Adult Health

## 2018-09-28 DIAGNOSIS — Z1231 Encounter for screening mammogram for malignant neoplasm of breast: Secondary | ICD-10-CM

## 2018-10-05 DIAGNOSIS — H10023 Other mucopurulent conjunctivitis, bilateral: Secondary | ICD-10-CM | POA: Diagnosis not present

## 2018-10-23 ENCOUNTER — Telehealth: Payer: Self-pay | Admitting: Allergy and Immunology

## 2018-10-23 ENCOUNTER — Other Ambulatory Visit: Payer: Self-pay | Admitting: *Deleted

## 2018-10-23 MED ORDER — FAMOTIDINE 40 MG PO TABS: 40.0000 mg | ORAL_TABLET | Freq: Every day | ORAL | 5 refills | Status: DC

## 2018-10-23 NOTE — Telephone Encounter (Signed)
Pt has appointment on tue 10/27/2018. But she is out of zantac and need something else, but the last rx he gave her made her stomach hurt [facility]. [phone].

## 2018-10-23 NOTE — Telephone Encounter (Signed)
Patient called back and explained that Omeprazole burns her stomach and since Ranitidine has been recalled wanted a medication to replace it. Famotodine 40mg  has been sent in to the patient's pharmacy to replace Ranitidine. Patient verbalized understanding.

## 2018-10-23 NOTE — Telephone Encounter (Signed)
Called and left voicemail asking for patient to return call to discuss.  

## 2018-10-27 ENCOUNTER — Ambulatory Visit: Payer: PPO | Admitting: Allergy and Immunology

## 2018-10-27 ENCOUNTER — Encounter: Payer: Self-pay | Admitting: *Deleted

## 2018-10-27 ENCOUNTER — Encounter: Payer: Self-pay | Admitting: Allergy and Immunology

## 2018-10-27 VITALS — BP 112/70 | HR 80 | Resp 20

## 2018-10-27 DIAGNOSIS — K219 Gastro-esophageal reflux disease without esophagitis: Secondary | ICD-10-CM | POA: Diagnosis not present

## 2018-10-27 DIAGNOSIS — B9789 Other viral agents as the cause of diseases classified elsewhere: Secondary | ICD-10-CM

## 2018-10-27 DIAGNOSIS — J4540 Moderate persistent asthma, uncomplicated: Secondary | ICD-10-CM

## 2018-10-27 DIAGNOSIS — J3089 Other allergic rhinitis: Secondary | ICD-10-CM | POA: Diagnosis not present

## 2018-10-27 DIAGNOSIS — H44002 Unspecified purulent endophthalmitis, left eye: Secondary | ICD-10-CM | POA: Diagnosis not present

## 2018-10-27 MED ORDER — VALACYCLOVIR HCL 500 MG PO TABS: ORAL_TABLET | ORAL | 1 refills | Status: DC

## 2018-10-27 MED ORDER — FAMOTIDINE 40 MG PO TABS: 40.0000 mg | ORAL_TABLET | Freq: Every day | ORAL | 1 refills | Status: DC

## 2018-10-27 MED ORDER — FLUTICASONE FUROATE 200 MCG/ACT IN AEPB: 1.0000 | INHALATION_SPRAY | Freq: Every day | RESPIRATORY_TRACT | 5 refills | Status: DC

## 2018-10-27 MED ORDER — VALACYCLOVIR HCL 500 MG PO TABS: 500.0000 mg | ORAL_TABLET | Freq: Two times a day (BID) | ORAL | 0 refills | Status: AC

## 2018-10-27 MED ORDER — FLUTICASONE FUROATE 200 MCG/ACT IN AEPB: 1.0000 | INHALATION_SPRAY | Freq: Every day | RESPIRATORY_TRACT | 1 refills | Status: DC

## 2018-10-27 NOTE — Patient Instructions (Addendum)
    1. Continue to Treat inflammation:   A. Arnuity 200 - one inhalation daily  B. OTC Rhinocort one spray each nostril 3-7 times a week  2. Continue to Treat reflux:   A. minimize caffeine and chocolate    B.  Famotidine 40 mg total in PM (Can be 10, 20, 40 mg tablets)  3. Treat a chronic viral eye infection with Valtrex 500 2 times per day for 10 days. Inform [facility] about response to Valtrex during next week's visit  4. If needed:   A. nasal saline wash  B. ProAir HFA 2 puffs every 4-6 hours  C. OTC antihistamine - Claritin/Allegra/Zyrtec  D. OTC Mucinex DM 2 tablets twice a day  5. Engage in a progressive aerobic exercise routine  6. Return to clinic in 6 months or earlier if problem

## 2018-10-27 NOTE — Progress Notes (Signed)
Writer sent in medications (Arnuity 200 and Valtrex) incorrectly to pharmacy resent new rx with corrections. [facility] on [place] and spoke to [facility] she corrected in their system.

## 2018-10-27 NOTE — Progress Notes (Signed)
Follow-up Note  Referring Provider: [name], NP Primary Provider: [name], NP Date of Office Visit: 10/27/2018  Subjective:   Amber Brewer (DOB: June 14, 1951) is a 68 y.o. female who returns to the [facility] on 10/27/2018 in re-evaluation of the following:  HPI: Kazue returns to this clinic in reevaluation of asthma and allergic rhinitis and LPR.  Her last visit to this clinic was 27 May 2018.  She has not required a systemic steroid or an antibiotic to treat any type of airway issue and she rarely uses a short acting bronchodilator.  However, she has noticed recently that she has had a little more dyspnea on exertion going up steps.  Her recovery time is less than a minute.  She has also noticed a little more wheezing.  She continues to use Arnuity 100 at this point in time.  She does not really exercise to any degree other than to occasionally use weights and yoga.  She has had very little issues with her upper airways.  Because of a availability issue she is no longer using ranitidine over the course of the past 2 weeks and has had a little bit more throat clearing and mucus in her throat.  She did have a prescription for famotidine 40 mg tablet sent into the pharmacy but that agent is apparently on back order.  Elzada informs me that she has had a chronic issue with her left eye for which she sought out treatment at [facility] ophthalmology department.  Her eye gets mucousy and puffy and sometimes itchy and basically those issues are there all the time to some degree.  If she utilizes a steroid eyedrop it helps during the use of the steroid but returns quickly after discontinuation of the steroid.  She has been on multiple oral antibiotics for this issue and topical antibiotics for this issue which have not helped her.  She has an appointment with [facility] next week to address this issue.  She did obtain a flu vaccine this year.  Allergies as of 10/27/2018       Reactions   Codeine Phosphate Nausea And Vomiting   REACTION: unspecified   Prednisone Shortness Of Breath   Betadine [povidone Iodine] Hives   After surgical skin prep   Sulfa Antibiotics Nausea And Vomiting   Iodine Rash   Broke out on skin once years ago--**Betadine Skin Prep**      Medication List      albuterol 108 (90 Base) MCG/ACT inhaler Commonly known as:  PROAIR HFA Inhale 1-2 puffs into the lungs every 4 (four) hours as needed.   ARNUITY ELLIPTA 100 MCG/ACT Aepb Generic drug:  Fluticasone Furoate Inhale 1 puff into the lungs daily.   ARNUITY ELLIPTA 100 MCG/ACT Aepb Generic drug:  Fluticasone Furoate INHALE 1 PUFF BY MOUTH ONCE DAILY (RINSE AND SPIT OUT AFTER USE)   carvedilol 12.5 MG tablet Commonly known as:  COREG TAKE ONE TABLET BY MOUTH TWICE DAILY   cholecalciferol 1000 units tablet Commonly known as:  VITAMIN D Take 5,000 Units by mouth daily.   citalopram 10 MG tablet Commonly known as:  CELEXA Take 0.5 tablets (5 mg total) by mouth daily.   cycloSPORINE 0.05 % ophthalmic emulsion Commonly known as:  RESTASIS Place 1 drop into both eyes 2 (two) times daily.   estradiol 0.05 MG/24HR patch Commonly known as:  VIVELLE-DOT Place 1 patch (0.05 mg total) onto the skin 2 (two) times a week.   famotidine  40 MG tablet Commonly known as:  PEPCID Take 1 tablet (40 mg total) by mouth at bedtime.   Fish Oil 1000 MG Caps Take 1 capsule by mouth daily.   Flax Seed Oil 1000 MG Caps Take 1 capsule by mouth daily.   hydrochlorothiazide 25 MG tablet Commonly known as:  HYDRODIURIL TAKE 1 TABLET BY MOUTH ONCE DAILY FOR BLOOD PRESSURE   irbesartan 150 MG tablet Commonly known as:  AVAPRO Take 1 tablet (150 mg total) by mouth daily.   levothyroxine 50 MCG tablet Commonly known as:  SYNTHROID, LEVOTHROID Take 25 mcg by mouth daily before breakfast.   potassium chloride 10 MEQ tablet Commonly known as:  K-DUR Take 1 tablet (10 mEq total) by mouth  daily.   RHINOCORT ALLERGY 32 MCG/ACT nasal spray Generic drug:  budesonide Place 1 spray into both nostrils daily as needed for allergies.   TURMERIC PO Take by mouth.   Zinc 100 MG Tabs Take 1 tablet by mouth daily.       Past Medical History:  Diagnosis Date  . Asthma   . Chicken pox   . DDD (degenerative disc disease), cervical   . Depression   . Gallstones   . GERD (gastroesophageal reflux disease)   . Hx of melanoma in situ 2002  . Hx: UTI (urinary tract infection)   . Hyperlipidemia   . Hypertension   . Hypothyroidism   . IBS (irritable bowel syndrome)   . Melanoma ([place])    x2   . Nephrolithiasis   . Osteoarthritis   . PVC's (premature ventricular contractions)   . Status post dilation of esophageal narrowing     Past Surgical History:  Procedure Laterality Date  . ABDOMINAL HYSTERECTOMY  1974  . APPENDECTOMY  1977  . BLADDER SURGERY  1974  . BREAST EXCISIONAL BIOPSY    . CATARACT EXTRACTION Bilateral 2015  . CHOLECYSTECTOMY  1987  . removal of melanoma in situ      Review of systems negative except as noted in HPI / PMHx or noted below:  Review of Systems  Constitutional: Negative.   HENT: Negative.   Eyes: Negative.   Respiratory: Negative.   Cardiovascular: Negative.   Gastrointestinal: Negative.   Genitourinary: Negative.   Musculoskeletal: Negative.   Skin: Negative.   Neurological: Negative.   Endo/Heme/Allergies: Negative.   Psychiatric/Behavioral: Negative.      Objective:   Vitals:   10/27/18 1038  BP: 112/70  Pulse: 80  Resp: 20          Physical Exam Constitutional:      Appearance: She is not diaphoretic.  HENT:     Head: Normocephalic.     Right Ear: Tympanic membrane, ear canal and external ear normal.     Left Ear: Tympanic membrane, ear canal and external ear normal.     Nose: Nose normal. No mucosal edema or rhinorrhea.     Mouth/Throat:     Pharynx: Uvula midline. No oropharyngeal exudate.  Eyes:      Conjunctiva/sclera: Conjunctivae normal.  Neck:     Thyroid: No thyromegaly.     Trachea: Trachea normal. No tracheal tenderness or tracheal deviation.  Cardiovascular:     Rate and Rhythm: Normal rate and regular rhythm.     Heart sounds: Normal heart sounds, S1 normal and S2 normal. No murmur.  Pulmonary:     Effort: No respiratory distress.     Breath sounds: Normal breath sounds. No stridor. No wheezing or rales.  Lymphadenopathy:  Head:     Right side of head: No tonsillar adenopathy.     Left side of head: No tonsillar adenopathy.     Cervical: No cervical adenopathy.  Skin:    Findings: No erythema or rash.     Nails: There is no clubbing.   Neurological:     Mental Status: She is alert.     Diagnostics:    Spirometry was performed and demonstrated an FEV1 of 1.62 at 68 % of predicted.  The patient had an Asthma Control Test with the following results:  .    Assessment and Plan:   1. Not well controlled moderate persistent asthma   2. Other allergic rhinitis   3. LPRD (laryngopharyngeal reflux disease)   4. Viral infection of left eye      1. Continue to Treat inflammation:   A. Arnuity 200 - one inhalation daily  B. OTC Rhinocort one spray each nostril 3-7 times a week  2. Continue to Treat reflux:   A. minimize caffeine and chocolate    B.  Famotidine 40 mg total in PM (Can be 10, 20, 40 mg tablets)  3. Treat a chronic viral eye infection with Valtrex 500 2 times per day for 10 days. Inform [facility] about response to Valtrex during next week's visit  4. If needed:   A. nasal saline wash  B. ProAir HFA 2 puffs every 4-6 hours  C. OTC antihistamine - Claritin/Allegra/Zyrtec  D. OTC Mucinex DM 2 tablets twice a day  5. Engage in a progressive aerobic exercise routine  6. Return to clinic in 6 months or earlier if problem  Shayma appears to have a little bit more activity of her lower airway inflammatory condition based on her symptomatology and her  spirometry and and we will give her Arnuity at 200 mcg/day instead of her current dose of 100.  I have also made the recommendation that she undergo some form of or progressive aerobic activity as there is probably a component of cardiac deconditioning also giving rise to some of her increased lower airway symptoms.  We will have her reinitiate use of an H2 receptor blocker to address her LPR.  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  I will see her back in this clinic in 6 months or earlier if there is a problem.  Allena Katz, MD Allergy / Immunology [place]

## 2018-10-28 ENCOUNTER — Encounter: Payer: Self-pay | Admitting: Allergy and Immunology

## 2018-10-30 ENCOUNTER — Telehealth: Payer: Self-pay

## 2018-10-30 NOTE — Telephone Encounter (Signed)
Patients pharmacy did receive medications. Pharmacy misinform patient that famotidine was on back order and her  Arnuity and Valtrex cannot be refilled since it is too early to refill them.

## 2018-10-30 NOTE — Telephone Encounter (Signed)
Patients pharmacy is telling her they do not have her refills on file. Could we please resend. The pharmacy on file Is correct.  [facility].

## 2018-11-05 ENCOUNTER — Other Ambulatory Visit: Payer: Self-pay | Admitting: Adult Health

## 2018-11-05 DIAGNOSIS — F419 Anxiety disorder, unspecified: Secondary | ICD-10-CM

## 2018-11-06 NOTE — Telephone Encounter (Signed)
Sent to the pharmacy by e-scribe for 90 days.  Pt has upcoming appt on 12/08/2018.

## 2018-12-08 ENCOUNTER — Encounter: Payer: Self-pay | Admitting: Adult Health

## 2018-12-08 ENCOUNTER — Ambulatory Visit (INDEPENDENT_AMBULATORY_CARE_PROVIDER_SITE_OTHER): Payer: PPO | Admitting: Adult Health

## 2018-12-08 VITALS — BP 166/100 | Temp 98.3°F | Wt 183.0 lb

## 2018-12-08 DIAGNOSIS — E039 Hypothyroidism, unspecified: Secondary | ICD-10-CM

## 2018-12-08 DIAGNOSIS — R7303 Prediabetes: Secondary | ICD-10-CM

## 2018-12-08 DIAGNOSIS — I10 Essential (primary) hypertension: Secondary | ICD-10-CM | POA: Diagnosis not present

## 2018-12-08 DIAGNOSIS — F419 Anxiety disorder, unspecified: Secondary | ICD-10-CM | POA: Diagnosis not present

## 2018-12-08 DIAGNOSIS — Z1211 Encounter for screening for malignant neoplasm of colon: Secondary | ICD-10-CM | POA: Diagnosis not present

## 2018-12-08 DIAGNOSIS — E782 Mixed hyperlipidemia: Secondary | ICD-10-CM

## 2018-12-08 DIAGNOSIS — Z0000 Encounter for general adult medical examination without abnormal findings: Secondary | ICD-10-CM | POA: Diagnosis not present

## 2018-12-08 LAB — COMPREHENSIVE METABOLIC PANEL
ALT: 14 U/L (ref 0–35)
AST: 16 U/L (ref 0–37)
Albumin: 4.2 g/dL (ref 3.5–5.2)
Alkaline Phosphatase: 83 U/L (ref 39–117)
BILIRUBIN TOTAL: 0.8 mg/dL (ref 0.2–1.2)
BUN: 16 mg/dL (ref 6–23)
CO2: 29 mEq/L (ref 19–32)
Calcium: 9.8 mg/dL (ref 8.4–10.5)
Chloride: 100 mEq/L (ref 96–112)
Creatinine, Ser: 0.86 mg/dL (ref 0.40–1.20)
GFR: 65.64 mL/min (ref >60.00)
GLUCOSE: 98 mg/dL (ref 70–99)
Potassium: 4.7 mEq/L (ref 3.5–5.1)
SODIUM: 138 meq/L (ref 135–145)
Total Protein: 6.9 g/dL (ref 6.0–8.3)

## 2018-12-08 LAB — HEPATIC FUNCTION PANEL
ALT: 14 U/L (ref 0–35)
AST: 16 U/L (ref 0–37)
Albumin: 4.2 g/dL (ref 3.5–5.2)
Alkaline Phosphatase: 83 U/L (ref 39–117)
BILIRUBIN TOTAL: 0.8 mg/dL (ref 0.2–1.2)
Bilirubin, Direct: 0.1 mg/dL (ref 0.0–0.3)
Total Protein: 6.9 g/dL (ref 6.0–8.3)

## 2018-12-08 LAB — CBC WITH DIFFERENTIAL/PLATELET
BASOS ABS: 0 10*3/uL (ref 0.0–0.1)
Basophils Relative: 0.7 % (ref 0.0–3.0)
Eosinophils Absolute: 0.1 10*3/uL (ref 0.0–0.7)
Eosinophils Relative: 2 % (ref 0.0–5.0)
HCT: 39.7 % (ref 36.0–46.0)
Hemoglobin: 13.1 g/dL (ref 12.0–15.0)
Lymphocytes Relative: 17.5 % (ref 12.0–46.0)
Lymphs Abs: 1.1 10*3/uL (ref 0.7–4.0)
MCHC: 33.2 g/dL (ref 30.0–36.0)
MCV: 88.9 fl (ref 78.0–100.0)
MONOS PCT: 13.7 % — AB (ref 3.0–12.0)
Monocytes Absolute: 0.9 10*3/uL (ref 0.1–1.0)
Neutro Abs: 4.1 10*3/uL (ref 1.4–7.7)
Neutrophils Relative %: 66.1 % (ref 43.0–77.0)
PLATELETS: 242 10*3/uL (ref 150.0–400.0)
RBC: 4.46 Mil/uL (ref 3.87–5.11)
RDW: 14.4 % (ref 11.5–15.5)
WBC: 6.2 10*3/uL (ref 4.0–10.5)

## 2018-12-08 LAB — T4, FREE: Free T4: 0.86 ng/dL (ref 0.60–1.60)

## 2018-12-08 LAB — HEMOGLOBIN A1C: HEMOGLOBIN A1C: 6.1 % (ref 4.6–6.5)

## 2018-12-08 LAB — TSH: TSH: 7.22 u[IU]/mL — AB (ref 0.35–4.50)

## 2018-12-08 LAB — T3, FREE: T3, Free: 3.2 pg/mL (ref 2.3–4.2)

## 2018-12-08 NOTE — Patient Instructions (Signed)
It was great seeing you today   We will follow up with you regarding the blood work   We have ordered cologuard for colon cancer screening   Please work on weight loss through diet and exercise

## 2018-12-08 NOTE — Progress Notes (Signed)
Subjective:    Patient ID: Amber Brewer, female    DOB: 1951-01-10, 68 y.o.   MRN: [id number]  HPI Patient presents for yearly preventative medicine examination. Pleasant 68 year old female who  has a past medical history of Asthma, Chicken pox, DDD (degenerative disc disease), cervical, Depression, Gallstones, GERD (gastroesophageal reflux disease), melanoma in situ (2002), UTI (urinary tract infection), Hyperlipidemia, Hypertension, Hypothyroidism, IBS (irritable bowel syndrome), Melanoma ([place]), Nephrolithiasis, Osteoarthritis, PVC's (premature ventricular contractions), and Status post dilation of esophageal narrowing.   Essential Hypertension -takes hydrochlorothiazide, Avapro, and Coreg.  Her blood pressures have been well controlled in the past.  She denies chest pain, shortness of breath, lightheadedness, dizziness, or headaches. She has been monitoring her blood pressure at home and reports readings between 093-267 systolic. She did not take her BP medication this morning  BP Readings from Last 3 Encounters:  12/08/18 (!) 166/100  10/27/18 112/70  06/03/18 137/82   Hypothyroidism - Takes Synthroid 50 mcg every morning.   Seasonal Allergies and Asthma -is seen by allergy and asthma.  Currently prescribed Arnuity Ellipta and Rhinocort  Hyperlipidemia - not currently on a statin medication  Pre Diabetic  Lab Results  Component Value Date   HGBA1C 5.7 (A) 05/06/2018    Anxiety - Takes Celexa 5 mg daily. She reports that her symptoms are very well controlled.   All immunizations and health maintenance protocols were reviewed with the patient and needed orders were placed. Vaccinations are up to date   Appropriate screening laboratory values were ordered for the patient including screening of hyperlipidemia, renal function and hepatic function.  Medication reconciliation,  past medical history, social history, problem list and allergies were reviewed in detail with the  patient  Goals were established with regard to weight loss, exercise, and  diet in compliance with medications. She has not been exercising nor eating healthy.   Wt Readings from Last 3 Encounters:  12/08/18 183 lb (83 kg)  06/03/18 173 lb (78.5 kg)  05/06/18 172 lb (78 kg)   End of life planning was discussed.  She is up-to-date on routine screening items such as mammogram, dental and vision screens.  She is due for colonoscopy- she would like to do cologuard    Review of Systems  Constitutional: Negative.   HENT: Negative.   Eyes: Negative.   Respiratory: Negative.   Cardiovascular: Negative.   Gastrointestinal: Negative.   Endocrine: Negative.   Genitourinary: Negative.   Musculoskeletal: Negative.   Skin: Negative.   Allergic/Immunologic: Negative.   Neurological: Negative.   Hematological: Negative.   Psychiatric/Behavioral: Negative.    Past Medical History:  Diagnosis Date  . Asthma   . Chicken pox   . DDD (degenerative disc disease), cervical   . Depression   . Gallstones   . GERD (gastroesophageal reflux disease)   . Hx of melanoma in situ 2002  . Hx: UTI (urinary tract infection)   . Hyperlipidemia   . Hypertension   . Hypothyroidism   . IBS (irritable bowel syndrome)   . Melanoma ([place])    x2   . Nephrolithiasis   . Osteoarthritis   . PVC's (premature ventricular contractions)   . Status post dilation of esophageal narrowing     Social History   Socioeconomic History  . Marital status: Married    Spouse name: Not on file  . Number of children: 2  . Years of education: 70  . Highest education level: Not on file  Occupational History  .  Occupation: licensed Engineer, site: OTHER    Comment: Office manager  Social Needs  . Financial resource strain: Not on file  . Food insecurity:    Worry: Not on file    Inability: Not on file  . Transportation needs:    Medical: Not on file    Non-medical: Not on file  Tobacco Use  .  Smoking status: Never Smoker  . Smokeless tobacco: Never Used  Substance and Sexual Activity  . Alcohol use: No  . Drug use: No  . Sexual activity: Yes    Birth control/protection: Post-menopausal, Surgical  Lifestyle  . Physical activity:    Days per week: Not on file    Minutes per session: Not on file  . Stress: Not on file  Relationships  . Social connections:    Talks on phone: Not on file    Gets together: Not on file    Attends religious service: Not on file    Active member of club or organization: Not on file    Attends meetings of clubs or organizations: Not on file    Relationship status: Not on file  . Intimate partner violence:    Fear of current or ex partner: Not on file    Emotionally abused: Not on file    Physically abused: Not on file    Forced sexual activity: Not on file  Other Topics Concern  . Not on file  Social History Narrative   epworth sleepiness scale = 8 (01/18/2016)   Lives at home with her husband and disabled son   Right handed   Caffeine: decaf    Past Surgical History:  Procedure Laterality Date  . ABDOMINAL HYSTERECTOMY  1974  . APPENDECTOMY  1977  . BLADDER SURGERY  1974  . BREAST EXCISIONAL BIOPSY    . CATARACT EXTRACTION Bilateral 2015  . CHOLECYSTECTOMY  1987  . removal of melanoma in situ      Family History  Problem Relation Age of Onset  . Hypertension Mother   . Thyroid disease Mother   . Arthritis Mother   . Depression Mother   . Hearing loss Mother   . Miscarriages / [place] Mother   . Heart attack Father   . Heart disease Father   . Alcohol abuse Father   . Arthritis Father   . Depression Father   . Drug abuse Father   . Hypertension Father   . Alcohol abuse Sister   . Arthritis Sister   . Depression Sister   . Hearing loss Sister   . Hypotension Sister   . Hypertension Maternal Aunt   . Hypertension Maternal Uncle   . Clotting disorder Maternal Uncle   . Prostate cancer Maternal Uncle   . Colon  cancer Maternal Uncle   . Colon polyps Maternal Uncle   . Diabetes Maternal Uncle        all uncles  . Arthritis Maternal Grandmother   . Asthma Maternal Grandmother   . Breast cancer Maternal Grandmother   . Stomach cancer Maternal Grandmother   . Hearing loss Maternal Grandmother   . Diabetes Maternal Grandfather   . Heart disease Maternal Grandfather   . Arthritis Maternal Grandfather   . Hearing loss Maternal Grandfather   . Hypertension Maternal Grandfather   . Kidney disease Maternal Grandfather   . Hyperlipidemia Paternal Grandmother        also MI  . Breast cancer Paternal Grandmother   . Arthritis Paternal Grandmother   .  Heart attack Paternal Grandmother   . Hyperlipidemia Paternal Grandfather        also MI  . Heart disease Paternal Grandfather   . Heart attack Paternal Grandfather   . Depression Sister   . Heart disease Sister   . Hyperlipidemia Sister   . Hypertension Son   . Endometriosis Daughter     Allergies  Allergen Reactions  . Codeine Phosphate Nausea And Vomiting    REACTION: unspecified  . Prednisone Shortness Of Breath  . Betadine [Povidone Iodine] Hives    After surgical skin prep  . Sulfa Antibiotics Nausea And Vomiting  . Iodine Rash    Broke out on skin once years ago--**Betadine Skin Prep**    Current Outpatient Medications on File Prior to Visit  Medication Sig Dispense Refill  . albuterol (PROAIR HFA) 108 (90 Base) MCG/ACT inhaler Inhale 1-2 puffs into the lungs every 4 (four) hours as needed. 1 Inhaler 1  . budesonide (RHINOCORT ALLERGY) 32 MCG/ACT nasal spray Place 1 spray into both nostrils daily as needed for allergies.     . carvedilol (COREG) 12.5 MG tablet TAKE ONE TABLET BY MOUTH TWICE DAILY (Patient taking differently: TAKE ONE TABLET IN THE MORNING AND 1/2 TAB AT NIGHT) 180 tablet 3  . cholecalciferol (VITAMIN D) 1000 UNITS tablet Take 5,000 Units by mouth daily.     . citalopram (CELEXA) 10 MG tablet TAKE 1/2 (ONE-HALF) TABLET  BY MOUTH ONCE DAILY 45 tablet 0  . cycloSPORINE (RESTASIS) 0.05 % ophthalmic emulsion Place 1 drop into both eyes 2 (two) times daily.     Marland Kitchen estradiol (VIVELLE-DOT) 0.05 MG/24HR patch Place 1 patch (0.05 mg total) onto the skin 2 (two) times a week. 24 patch 1  . famotidine (PEPCID) 40 MG tablet Take 1 tablet (40 mg total) by mouth daily. 90 tablet 1  . Flaxseed, Linseed, (FLAX SEED OIL) 1000 MG CAPS Take 1 capsule by mouth daily.     . Fluticasone Furoate (ARNUITY ELLIPTA) 200 MCG/ACT AEPB Inhale 1 puff into the lungs daily. 90 each 1  . hydrochlorothiazide 25 MG tablet TAKE 1 TABLET BY MOUTH ONCE DAILY FOR BLOOD PRESSURE 30 tablet 11  . irbesartan (AVAPRO) 150 MG tablet Take 1 tablet (150 mg total) by mouth daily. 90 tablet 3  . levothyroxine (SYNTHROID, LEVOTHROID) 50 MCG tablet Take 25 mcg by mouth daily before breakfast.     . Omega-3 Fatty Acids (FISH OIL) 1000 MG CAPS Take 1 capsule by mouth daily.     . potassium chloride (K-DUR) 10 MEQ tablet Take 1 tablet (10 mEq total) by mouth daily. 90 tablet 3  . TURMERIC PO Take by mouth.    . Zinc 100 MG TABS Take 1 tablet by mouth daily.     No current facility-administered medications on file prior to visit.     BP (!) 166/100   Temp 98.3 F (36.8 C)   Wt 183 lb (83 kg)   BMI 31.41 kg/m       Objective:   Physical Exam Vitals signs and nursing note reviewed.  Constitutional:      General: She is not in acute distress.    Appearance: Normal appearance. She is well-developed and normal weight.  HENT:     Head: Normocephalic and atraumatic.     Right Ear: Tympanic membrane, ear canal and external ear normal. There is no impacted cerumen.     Left Ear: Tympanic membrane, ear canal and external ear normal. There is no impacted cerumen.  Nose: Nose normal. No congestion or rhinorrhea.     Mouth/Throat:     Mouth: Mucous membranes are moist.     Pharynx: Oropharynx is clear. No oropharyngeal exudate.  Eyes:     General:         Right eye: No discharge.        Left eye: No discharge.     Extraocular Movements: Extraocular movements intact.     Conjunctiva/sclera: Conjunctivae normal.     Pupils: Pupils are equal, round, and reactive to light.  Neck:     Musculoskeletal: Normal range of motion and neck supple.     Thyroid: No thyromegaly.     Trachea: No tracheal deviation.  Cardiovascular:     Rate and Rhythm: Normal rate and regular rhythm.     Heart sounds: Normal heart sounds. No murmur. No friction rub. No gallop.   Pulmonary:     Effort: Pulmonary effort is normal. No respiratory distress.     Breath sounds: Normal breath sounds. No stridor. No wheezing, rhonchi or rales.  Chest:     Chest wall: No tenderness.  Abdominal:     General: Bowel sounds are normal. There is no distension.     Palpations: Abdomen is soft. There is no mass.     Tenderness: There is no abdominal tenderness. There is no guarding or rebound.  Musculoskeletal: Normal range of motion.        General: No swelling, tenderness, deformity or signs of injury.     Right lower leg: No edema.     Left lower leg: No edema.  Lymphadenopathy:     Cervical: No cervical adenopathy.  Skin:    General: Skin is warm and dry.     Capillary Refill: Capillary refill takes less than 2 seconds.     Coloration: Skin is not jaundiced or pale.     Findings: No bruising, erythema, lesion or rash.  Neurological:     General: No focal deficit present.     Mental Status: She is alert and oriented to person, place, and time.     Cranial Nerves: No cranial nerve deficit.     Sensory: No sensory deficit.     Motor: No weakness.     Coordination: Coordination normal.     Gait: Gait normal.     Deep Tendon Reflexes: Reflexes normal.  Psychiatric:        Mood and Affect: Mood normal.        Behavior: Behavior normal.        Thought Content: Thought content normal.        Judgment: Judgment normal.       Assessment & Plan:  1. Routine general medical  examination at a health care facility - Needs to get back in the routine of heart healthy diet and frequent exercise  - Follow up in one year or sooner if needed - Hepatic function panel - TSH - Hemoglobin A1c - Comprehensive metabolic panel - CBC with Differential/Platelet - T3, Free - T4, Free  2. Anxiety - Well controlled with Celexa 5 mg   3. Essential hypertension - Advised to take BP medication after this visit.  - Continue to monitor  - Hepatic function panel - TSH - Hemoglobin A1c - Comprehensive metabolic panel - CBC with Differential/Platelet - T3, Free - T4, Free  4. Mixed hyperlipidemia - Consider statin  - Hepatic function panel - TSH - Hemoglobin A1c - Comprehensive metabolic panel - CBC with Differential/Platelet -  T3, Free - T4, Free  5. Hypothyroidism, unspecified type - consider increase in synthroid  - Hepatic function panel - TSH - Hemoglobin A1c - Comprehensive metabolic panel - CBC with Differential/Platelet - T3, Free - T4, Free  6. Pre-diabetes - Encouraged diet and exercise  - Hepatic function panel - TSH - Hemoglobin A1c - Comprehensive metabolic panel - CBC with Differential/Platelet - T3, Free - T4, Free  7. Colon cancer screening - Will order cologuard   [name], NP   [name], NP

## 2018-12-25 ENCOUNTER — Other Ambulatory Visit: Payer: Self-pay | Admitting: Family Medicine

## 2018-12-25 MED ORDER — IRBESARTAN 150 MG PO TABS: 150.0000 mg | ORAL_TABLET | Freq: Every day | ORAL | 3 refills | Status: DC

## 2019-01-08 DIAGNOSIS — Z1211 Encounter for screening for malignant neoplasm of colon: Secondary | ICD-10-CM | POA: Diagnosis not present

## 2019-01-08 DIAGNOSIS — Z1212 Encounter for screening for malignant neoplasm of rectum: Secondary | ICD-10-CM | POA: Diagnosis not present

## 2019-01-12 LAB — COLOGUARD: Cologuard: NEGATIVE

## 2019-01-15 ENCOUNTER — Telehealth: Payer: Self-pay | Admitting: Adult Health

## 2019-01-15 MED ORDER — HYDROCHLOROTHIAZIDE 25 MG PO TABS: 25.0000 mg | ORAL_TABLET | Freq: Every day | ORAL | 3 refills | Status: DC

## 2019-01-15 NOTE — Telephone Encounter (Unsigned)
Copied from [place] [id number]. Topic: Quick Communication - Rx Refill/Question >> Jan 15, 2019  1:22 PM [place], [place] A wrote: Medication: hydrochlorothiazide 25 MG tablet [[id number]] - Left VM need 90 days supply - Tommi Rumps has never filled this for pt   Has the patient contacted their pharmacy? No. (Agent: If no, request that the patient contact the pharmacy for the refill.) (Agent: If yes, when and what did the pharmacy advise?)  Preferred Pharmacy (with phone number or street name): [facility] ([address]),  - [place] [phone] (Phone)   Agent: Please be advised that RX refills may take up to 3 business days. We ask that you follow-up with your pharmacy.

## 2019-01-15 NOTE — Telephone Encounter (Signed)
Medication refill

## 2019-02-05 ENCOUNTER — Encounter: Payer: Self-pay | Admitting: Adult Health

## 2019-02-06 ENCOUNTER — Other Ambulatory Visit: Payer: Self-pay | Admitting: Adult Health

## 2019-02-06 DIAGNOSIS — F419 Anxiety disorder, unspecified: Secondary | ICD-10-CM

## 2019-02-08 ENCOUNTER — Encounter: Payer: Self-pay | Admitting: Family Medicine

## 2019-02-08 NOTE — Telephone Encounter (Signed)
Sent to the pharmacy by e-scribe. 

## 2019-02-15 IMAGING — MG DIGITAL SCREENING BILATERAL MAMMOGRAM WITH TOMO AND CAD
8 series · 9 of 24 positions shown · non-contrast
Comparison: Previous exam(s).

CLINICAL DATA: Screening.

EXAM:
DIGITAL SCREENING BILATERAL MAMMOGRAM WITH TOMO AND CAD

[L MLO synth-2D]
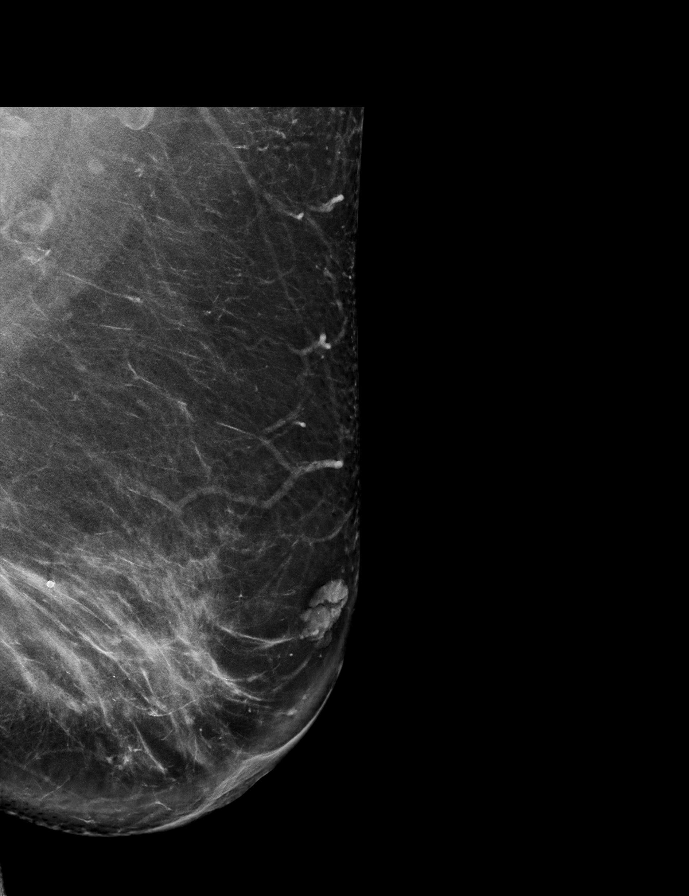

[R MLO synth-2D]
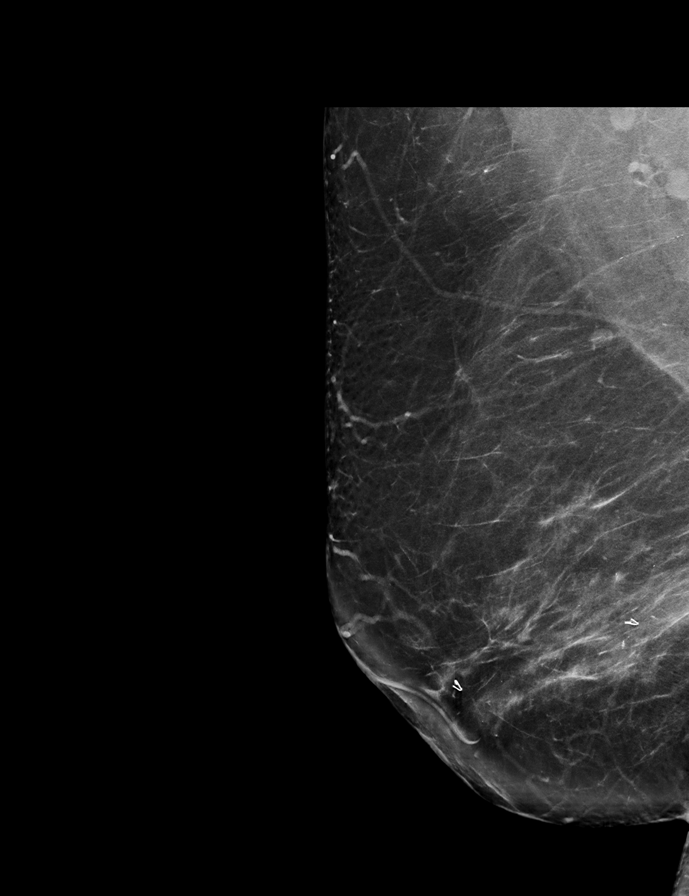

[R CC synth-2D]
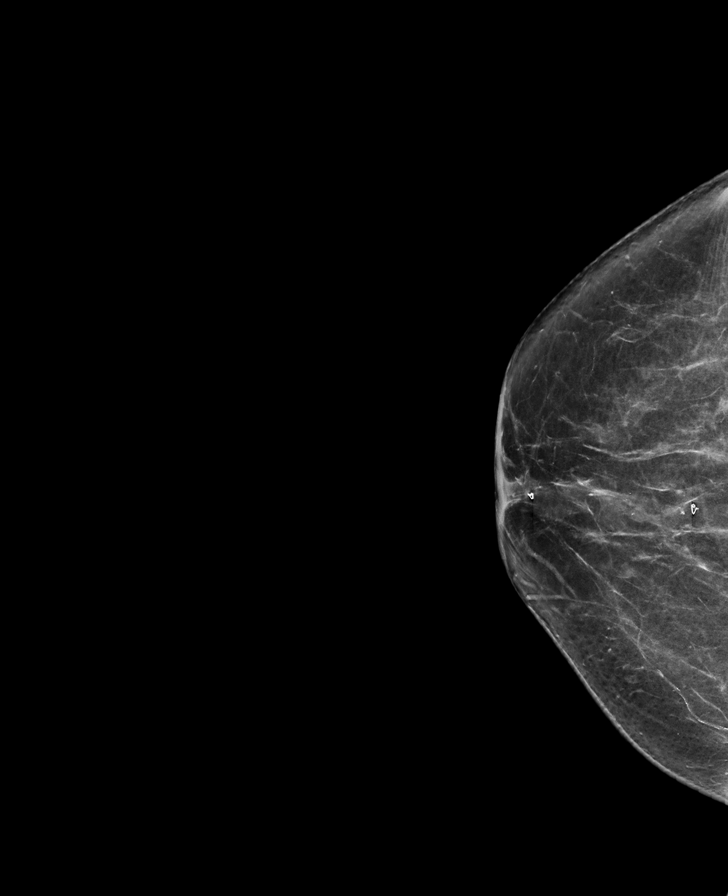

[L CC synth-2D]
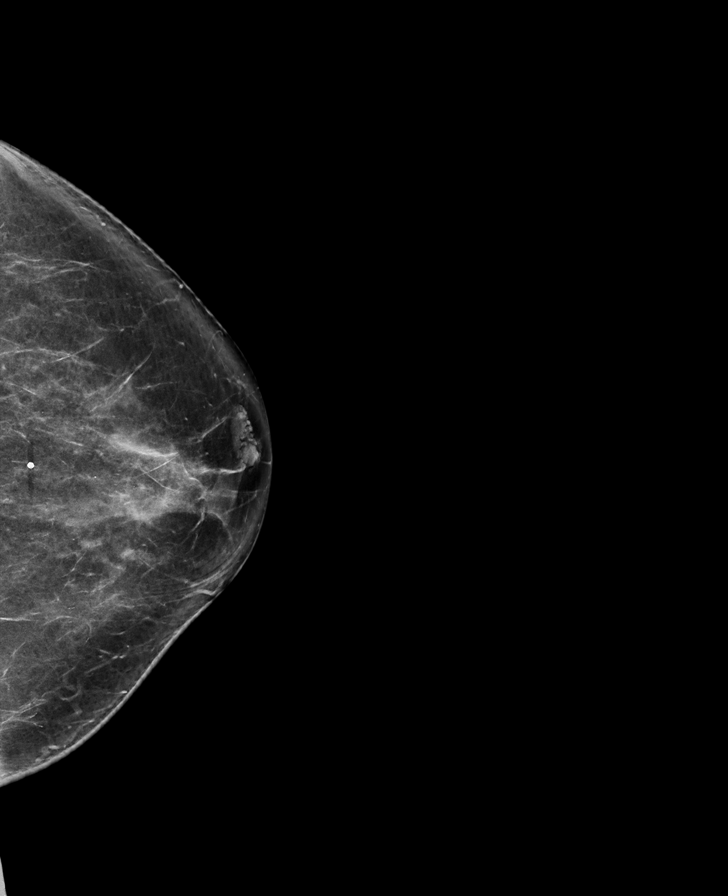

[L MLO tomo · 2 of 88 frames shown]
[frame 29/88]
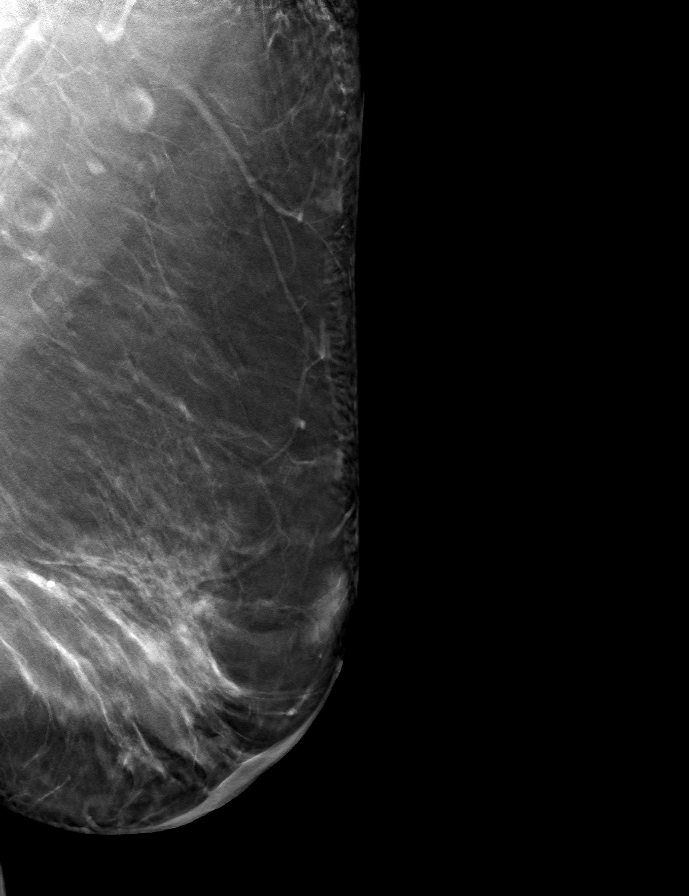
[frame 45/88]
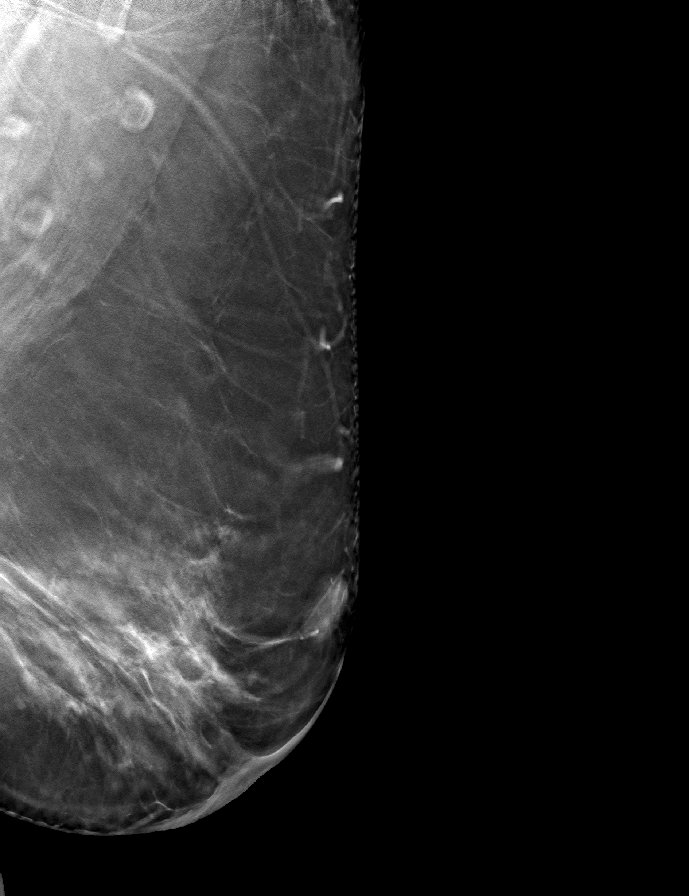

[L CC tomo · tomo slice 39/76.0]
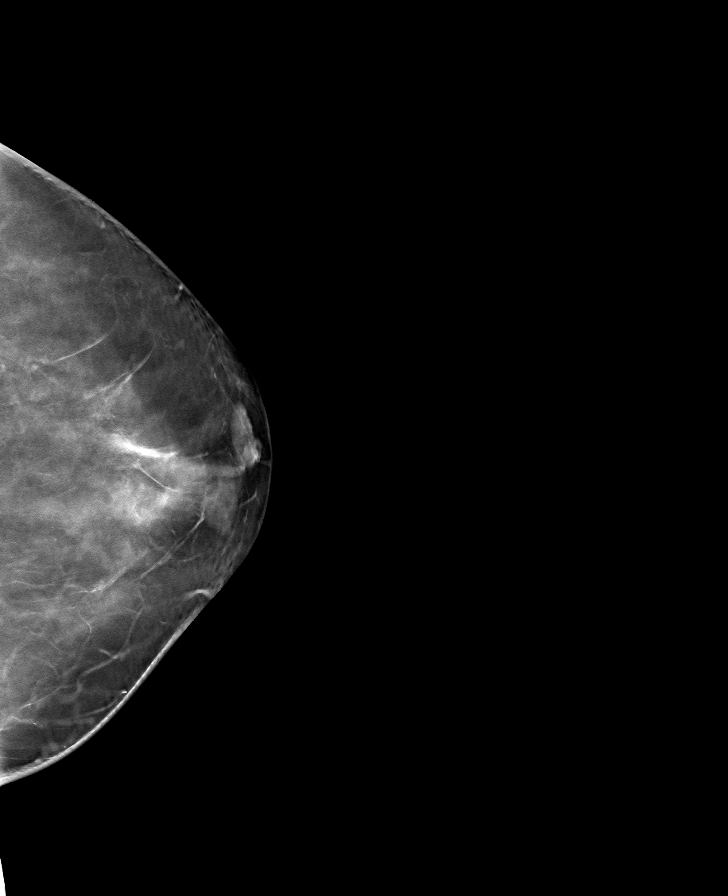

[R MLO tomo · tomo slice 43/85.0]
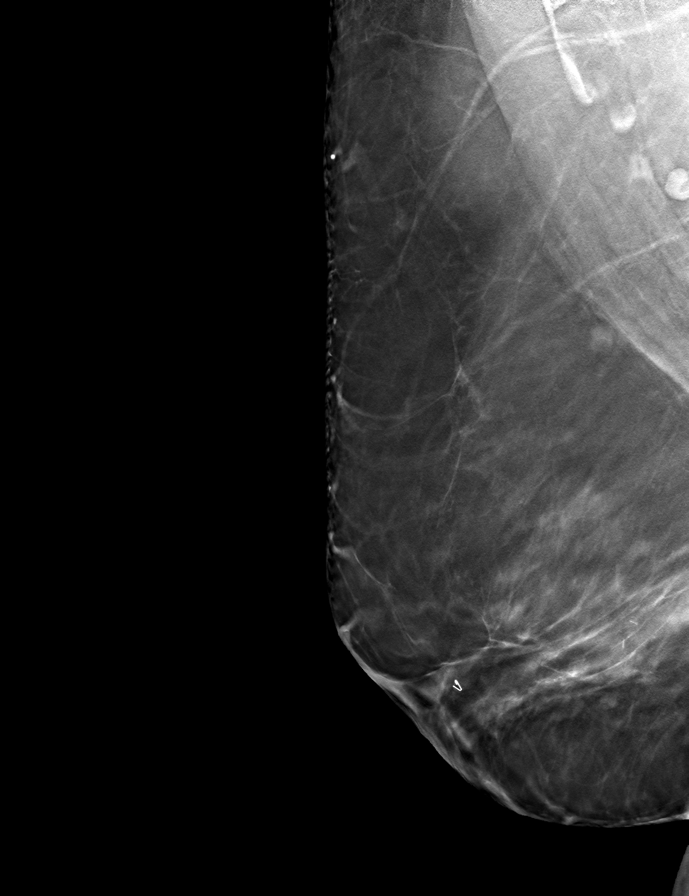

[R CC tomo · tomo slice 36/71.0]
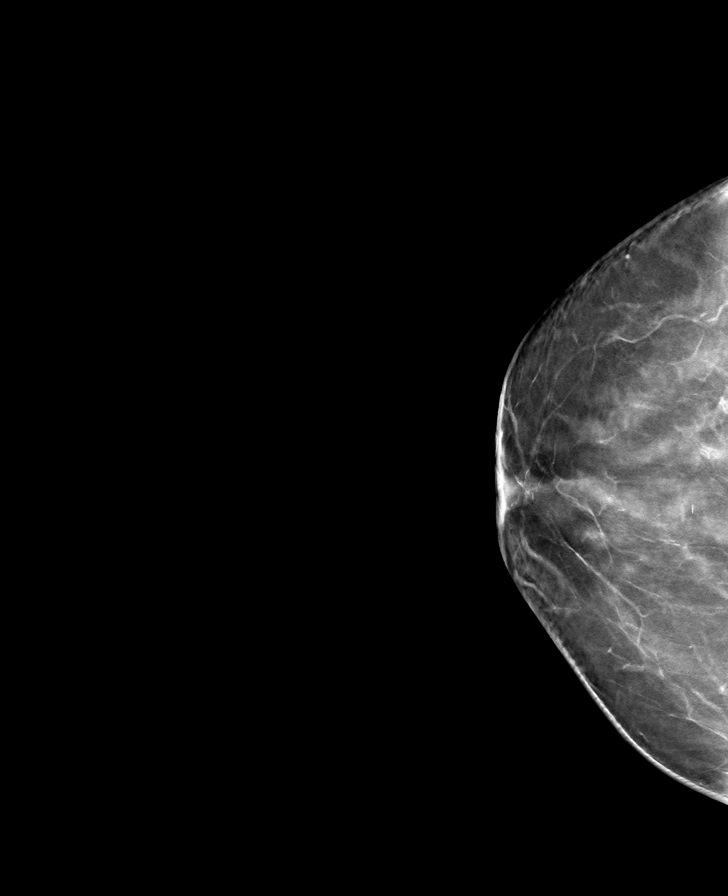

[9 of 24 positions shown; findings below may reference images not displayed]

ACR Breast Density Category b: There are scattered areas of
fibroglandular density.
FINDINGS: There are no findings suspicious for malignancy. Images were
processed with CAD.
IMPRESSION: No mammographic evidence of malignancy. A result letter of this
screening mammogram will be mailed directly to the patient.

RECOMMENDATION:
Screening mammogram in one year. (Code:[id number])

BI-RADS CATEGORY  1: Negative.

## 2019-02-23 ENCOUNTER — Other Ambulatory Visit: Payer: Self-pay | Admitting: Adult Health

## 2019-02-24 NOTE — Telephone Encounter (Signed)
Sent to the pharmacy by e-scribe. 

## 2019-04-27 ENCOUNTER — Ambulatory Visit: Payer: PPO | Admitting: Allergy and Immunology

## 2019-05-25 ENCOUNTER — Encounter: Payer: Self-pay | Admitting: Allergy and Immunology

## 2019-05-25 ENCOUNTER — Ambulatory Visit (INDEPENDENT_AMBULATORY_CARE_PROVIDER_SITE_OTHER): Payer: PPO | Admitting: Allergy and Immunology

## 2019-05-25 ENCOUNTER — Other Ambulatory Visit: Payer: Self-pay

## 2019-05-25 VITALS — BP 132/70 | HR 60 | Temp 97.2°F | Resp 18

## 2019-05-25 DIAGNOSIS — J4540 Moderate persistent asthma, uncomplicated: Secondary | ICD-10-CM | POA: Diagnosis not present

## 2019-05-25 DIAGNOSIS — B9789 Other viral agents as the cause of diseases classified elsewhere: Secondary | ICD-10-CM

## 2019-05-25 DIAGNOSIS — J3089 Other allergic rhinitis: Secondary | ICD-10-CM

## 2019-05-25 DIAGNOSIS — H44002 Unspecified purulent endophthalmitis, left eye: Secondary | ICD-10-CM | POA: Diagnosis not present

## 2019-05-25 DIAGNOSIS — K219 Gastro-esophageal reflux disease without esophagitis: Secondary | ICD-10-CM

## 2019-05-25 MED ORDER — ARNUITY ELLIPTA 200 MCG/ACT IN AEPB: 1.0000 | INHALATION_SPRAY | Freq: Every day | RESPIRATORY_TRACT | 1 refills | Status: DC

## 2019-05-25 NOTE — Patient Instructions (Signed)
    1. Continue to Treat inflammation:   A. Arnuity 200 - one inhalation daily  B. OTC Rhinocort one spray each nostril 3-7 times a week  2. Continue to Treat reflux:   A. minimize caffeine and chocolate    B.  Famotidine 20 mg - one time per day  3. If needed:   A. nasal saline wash  B. ProAir HFA 2 puffs every 4-6 hours  C. OTC antihistamine - Claritin/Allegra/Zyrtec  D. OTC Mucinex DM 2 tablets twice a day  4. Can start Valtrex 500 2 times per day for 10 days at onset of next left eye outbreak. Inform [facility] about response to Valtrex    5. Return to clinic in 6 months or earlier if problem  6.  Obtain fall flu vaccine (and COVID vaccine)

## 2019-05-25 NOTE — Progress Notes (Signed)
[place]   Follow-up Note  Referring Provider: [name], NP Primary Provider: [name], NP Date of Office Visit: 05/25/2019  Subjective:   Amber Brewer (DOB: 27-Oct-1950) is a 68 y.o. female who returns to the [facility] on 05/25/2019 in re-evaluation of the following:  HPI: Shanah returns to this clinic in evaluation of asthma and allergic rhinitis and LPR.  Her last visit to this clinic was 27 October 2018.  She has continued to do quite well with her asthma and has not required a systemic steroid to treat an exacerbation and rarely uses a short acting bronchodilator and she can exercise without any problem while she remains on Arnuity.  She has had very little problems with her upper airways at this point in time.  Her reflux and her throat issue are under excellent control while using famotidine at a dose of just 20 mg 1 time per day.  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  Unfortunately, it took her about 1 week to obtain her Valtrex prescription and by that point in time her eye was much better.  Thus, she never did use her Valtrex prescription.  Allergies as of 05/25/2019      Reactions   Codeine Phosphate Nausea And Vomiting   REACTION: unspecified   Prednisone Shortness Of Breath   Betadine [povidone Iodine] Hives   After surgical skin prep   Sulfa Antibiotics Nausea And Vomiting   Iodine Rash   Broke out on skin once years ago--**Betadine Skin Prep**      Medication List      albuterol 108 (90 Base) MCG/ACT inhaler Commonly known as: ProAir HFA Inhale 1-2 puffs into the lungs every 4 (four) hours as needed.   carvedilol 12.5 MG tablet Commonly known as: COREG TAKE ONE TABLET BY MOUTH TWICE DAILY   cholecalciferol 1000 units tablet Commonly known as:  VITAMIN D Take 5,000 Units by mouth daily.   citalopram 10 MG tablet Commonly known as: CELEXA Take 1/2 (one-half) tablet by mouth once daily   cycloSPORINE 0.05 % ophthalmic emulsion Commonly known as: RESTASIS Place 1 drop into both eyes 2 (two) times daily.   estradiol 0.05 MG/24HR patch Commonly known as: VIVELLE-DOT APPLY 1 PATCH TOPICALLY ONTO THE SKIN 2 TIMES A WEEK   famotidine 40 MG tablet Commonly known as: PEPCID Take 1 tablet (40 mg total) by mouth daily. What changed: how much to take   Fish Oil 1000 MG Caps Take 1 capsule by mouth daily.   Flax Seed Oil 1000 MG Caps Take 1 capsule by mouth daily.   Fluticasone Furoate 200 MCG/ACT Aepb Commonly known as: Arnuity Ellipta Inhale 1 puff into the lungs daily.   hydrochlorothiazide 25 MG tablet Commonly known as: HYDRODIURIL Take 1 tablet (25 mg total) by mouth daily.   irbesartan 150 MG tablet Commonly known as: AVAPRO Take 1 tablet (150 mg total) by mouth daily.   levothyroxine 50 MCG tablet Commonly known as: SYNTHROID Take 25 mcg by mouth daily before breakfast.   potassium chloride 10 MEQ tablet Commonly known as: K-DUR Take 1 tablet by mouth once daily   Rhinocort Allergy 32 MCG/ACT nasal spray Generic drug: budesonide Place 1 spray into both nostrils daily as needed for allergies.   TURMERIC PO Take by mouth.  Zinc 100 MG Tabs Take 1 tablet by mouth daily.       Past Medical History:  Diagnosis Date  . Asthma   . Chicken pox   . DDD (degenerative disc disease), cervical   . Depression   . Gallstones   . GERD (gastroesophageal reflux disease)   . Hx of melanoma in situ 2002  . Hx: UTI (urinary tract infection)   . Hyperlipidemia   . Hypertension   . Hypothyroidism   . IBS (irritable bowel syndrome)   . Melanoma ([place])    x2   . Nephrolithiasis   . Osteoarthritis   . PVC's (premature ventricular contractions)   . Status post dilation of esophageal narrowing     Past  Surgical History:  Procedure Laterality Date  . ABDOMINAL HYSTERECTOMY  1974  . APPENDECTOMY  1977  . BLADDER SURGERY  1974  . BREAST EXCISIONAL BIOPSY    . CATARACT EXTRACTION Bilateral 2015  . CHOLECYSTECTOMY  1987  . removal of melanoma in situ      Review of systems negative except as noted in HPI / PMHx or noted below:  Review of Systems  Constitutional: Negative.   HENT: Negative.   Eyes: Negative.   Respiratory: Negative.   Cardiovascular: Negative.   Gastrointestinal: Negative.   Genitourinary: Negative.   Musculoskeletal: Negative.   Skin: Negative.   Neurological: Negative.   Endo/Heme/Allergies: Negative.   Psychiatric/Behavioral: Negative.      Objective:   Vitals:   05/25/19 1028  BP: 132/70  Pulse: 60  Resp: 18  Temp: (!) 97.2 F (36.2 C)  SpO2: 95%          Physical Exam Constitutional:      Appearance: She is not diaphoretic.  HENT:     Head: Normocephalic.     Right Ear: Tympanic membrane, ear canal and external ear normal.     Left Ear: Tympanic membrane, ear canal and external ear normal.     Nose: Nose normal. No mucosal edema or rhinorrhea.     Mouth/Throat:     Pharynx: Uvula midline. No oropharyngeal exudate.  Eyes:     Conjunctiva/sclera: Conjunctivae normal.  Neck:     Thyroid: No thyromegaly.     Trachea: Trachea normal. No tracheal tenderness or tracheal deviation.  Cardiovascular:     Rate and Rhythm: Normal rate and regular rhythm.     Heart sounds: Normal heart sounds, S1 normal and S2 normal. No murmur.  Pulmonary:     Effort: No respiratory distress.     Breath sounds: Normal breath sounds. No stridor. No wheezing or rales.  Lymphadenopathy:     Head:     Right side of head: No tonsillar adenopathy.     Left side of head: No tonsillar adenopathy.     Cervical: No cervical adenopathy.  Skin:    Findings: No erythema or rash.     Nails: There is no clubbing.   Neurological:     Mental Status: She is alert.      Diagnostics:    Spirometry was performed and demonstrated an FEV1 of 1.68 at 71 % of predicted.  The patient had an Asthma Control Test with the following results: ACT Total Score: 22.    Assessment and Plan:   1. Asthma, moderate persistent, well-controlled   2. Other allergic rhinitis   3. LPRD (laryngopharyngeal reflux disease)   4. Viral infection of left eye      1. Continue to Treat inflammation:  A. Arnuity 200 - one inhalation daily  B. OTC Rhinocort one spray each nostril 3-7 times a week  2. Continue to Treat reflux:   A.   minimize caffeine and chocolate    B.  Famotidine 20 mg - one time per day  3. If needed:   A. nasal saline wash  B. ProAir HFA 2 puffs every 4-6 hours  C. OTC antihistamine - Claritin/Allegra/Zyrtec  D. OTC Mucinex DM 2 tablets twice a day  4. Can start Valtrex 500 2 times per day for 10 days at onset of next left eye outbreak. Inform [facility] about response to Valtrex    5. Return to clinic in 6 months or earlier if problem  6.  Obtain fall flu vaccine (and COVID vaccine)  Codee is really doing very well with her current plan of anti-inflammatory agents for her airway and therapy directed against reflux as noted above.  We will continue to have her use these medications and see her back in this clinic in 6 months or earlier if there is a problem.  As well, I have asked her to utilize her prescription of Valtrex at the onset of her cyclical left eye inflammatory event to see if this antiviral agent aborts the development of significant inflammation and she can take that response information to [facility] ophthalmology department during her next visit.  Allena Katz, MD Allergy / Immunology [place]

## 2019-05-26 ENCOUNTER — Encounter: Payer: Self-pay | Admitting: Allergy and Immunology

## 2019-08-02 DIAGNOSIS — H10023 Other mucopurulent conjunctivitis, bilateral: Secondary | ICD-10-CM | POA: Diagnosis not present

## 2019-08-09 ENCOUNTER — Other Ambulatory Visit: Payer: Self-pay | Admitting: Internal Medicine

## 2019-08-09 ENCOUNTER — Other Ambulatory Visit: Payer: Self-pay | Admitting: Adult Health

## 2019-08-09 DIAGNOSIS — I493 Ventricular premature depolarization: Secondary | ICD-10-CM

## 2019-08-09 DIAGNOSIS — F419 Anxiety disorder, unspecified: Secondary | ICD-10-CM

## 2019-08-12 ENCOUNTER — Other Ambulatory Visit: Payer: Self-pay | Admitting: Family Medicine

## 2019-08-12 ENCOUNTER — Other Ambulatory Visit: Payer: Self-pay | Admitting: Adult Health

## 2019-08-12 NOTE — Telephone Encounter (Signed)
Spoke to the pt.  She states she has spoke to [facility] in the past about taking a whole pill but then decided against it.  She would now like to revisit that.  She has been feeling extremely tired and having hot flashes and over all not feeling well.  Advised that I would discuss with Tommi Rumps and call her back but may not until 08/13/2019.  Pt agreed.

## 2019-08-12 NOTE — Telephone Encounter (Signed)
Ok to refill for 6 months but does she want a 25 mcg tab so she does not have to split?

## 2019-08-12 NOTE — Telephone Encounter (Signed)
Ok to send in 50 mg tab- take one entire tablet

## 2019-08-13 ENCOUNTER — Other Ambulatory Visit: Payer: Self-pay | Admitting: Adult Health

## 2019-08-13 DIAGNOSIS — Z1231 Encounter for screening mammogram for malignant neoplasm of breast: Secondary | ICD-10-CM

## 2019-08-13 MED ORDER — LEVOTHYROXINE SODIUM 50 MCG PO TABS: 50.0000 ug | ORAL_TABLET | Freq: Every day | ORAL | 0 refills | Status: DC

## 2019-08-13 NOTE — Telephone Encounter (Signed)
Sent to the pharmacy by e-scribe. 

## 2019-08-23 ENCOUNTER — Encounter: Payer: Self-pay | Admitting: Internal Medicine

## 2019-08-23 ENCOUNTER — Telehealth (INDEPENDENT_AMBULATORY_CARE_PROVIDER_SITE_OTHER): Payer: PPO | Admitting: Internal Medicine

## 2019-08-23 DIAGNOSIS — I493 Ventricular premature depolarization: Secondary | ICD-10-CM | POA: Diagnosis not present

## 2019-08-23 DIAGNOSIS — E038 Other specified hypothyroidism: Secondary | ICD-10-CM

## 2019-08-23 DIAGNOSIS — I10 Essential (primary) hypertension: Secondary | ICD-10-CM

## 2019-08-23 DIAGNOSIS — E782 Mixed hyperlipidemia: Secondary | ICD-10-CM

## 2019-08-23 MED ORDER — CARVEDILOL 12.5 MG PO TABS: 12.5000 mg | ORAL_TABLET | Freq: Two times a day (BID) | ORAL | 3 refills | Status: DC

## 2019-08-23 NOTE — Patient Instructions (Signed)
Medication Instructions:  INCREASE irbesartan to 300mg  daily  *If you need a refill on your cardiac medications before your next appointment, please call your pharmacy*  Lab Work: FASTING lab work to check cholesterol   If you have labs (blood work) drawn today and your tests are completely normal, you will receive your results only by: Marland Kitchen MyChart Message (if you have MyChart) OR . A paper copy in the mail If you have any lab test that is abnormal or we need to change your treatment, we will call you to review the results.  Testing/Procedures: NONE  Follow-Up: At [facility] [address], you and your health needs are our priority.  As part of our continuing mission to provide you with exceptional heart care, we have created designated Provider Care Teams.  These Care Teams include your primary Cardiologist (physician) and Advanced Practice Providers (APPs -  Physician Assistants and Nurse Practitioners) who all work together to provide you with the care you need, when you need it.  Your next appointment:   12 months  The format for your next appointment:   In Person  Provider:   Raliegh Ip Mali Hilty, MD  Other Instructions  Dr. Debara Pickett has requested that you check your BP at home and call with readings or send via [place]:  Rest 5 minutes before taking your blood pressure.  Don't smoke or drink caffeinated beverages for at least 30 minutes before.  Take your blood pressure before (not after) you eat.  Sit comfortably with your back supported and both feet on the floor (don't cross your legs).  Elevate your arm to heart level on a table or a desk.  Use the proper sized cuff. It should fit smoothly and snugly around your bare upper arm. There should be enough room to slip a fingertip under the cuff. The bottom edge of the cuff should be 1 inch above the crease of the elbow.  Ideally, take 3 measurements at one sitting and record the average.

## 2019-08-23 NOTE — Progress Notes (Signed)
Virtual Visit via Telephone Note   This visit type was conducted due to national recommendations for restrictions regarding the COVID-19 Pandemic (e.g. social distancing) in an effort to limit this patient's exposure and mitigate transmission in our community.  Due to her co-morbid illnesses, this patient is at least at moderate risk for complications without adequate follow up.  This format is felt to be most appropriate for this patient at this time.  The patient did not have access to video technology/had technical difficulties with video requiring transitioning to audio format only (telephone).  All issues noted in this document were discussed and addressed.  No physical exam could be performed with this format.  Please refer to the patient's chart for her  consent to telehealth for [facility].   Evaluation Performed:  Telephone follow-up  Date:  08/23/2019   ID:  Amber Brewer, DOB [birth year], MRN [id number]  Patient Location:  [id number]  Provider location:   [address], [address] [place], [place] [address]  PCP:  [name], NP  Cardiologist:  No primary care provider on file. Electrophysiologist:  None   Chief Complaint:  Feels well  History of Present Illness:    Amber Brewer is a 68 y.o. female who presents via audio/video conferencing for a telehealth visit today.  Amber Brewer is a 68 year old female last saw in 2018.  She has a history of PVCs, shortness of breath with exertion, hypertension, dyslipidemia and some issues with imbalance.  Overall in general these have improved significantly.  Her blood pressure has been a major issue though.  It was previously fairly well controlled however I had made adjustments in her medications including going up on her beta-blocker and decreasing irbesartan.  In general she was around 130/70 however she has started to creep up.  Today her blood pressure was on the 150s and earlier this year  was in the 160s.  In general when she monitors it she notices higher.  Her weight is also going up as well.  She is about 10 pounds heavier than she was last year.  She does report an increase in some stress.  Her last lipid profile was in February 2019 which showed total cholesterol 212, HDL 54, LDL 145 and triglycerides 64.  She is not currently on statin therapy.  The patient does not have symptoms concerning for COVID-19 infection (fever, chills, cough, or new SHORTNESS OF BREATH).    Prior CV studies:   The following studies were reviewed today:  Chart reviewed  PMHx:  Past Medical History:  Diagnosis Date  . Asthma   . Chicken pox   . DDD (degenerative disc disease), cervical   . Depression   . Gallstones   . GERD (gastroesophageal reflux disease)   . Hx of melanoma in situ 2002  . Hx: UTI (urinary tract infection)   . Hyperlipidemia   . Hypertension   . Hypothyroidism   . IBS (irritable bowel syndrome)   . Melanoma ([place])    x2   . Nephrolithiasis   . Osteoarthritis   . PVC's (premature ventricular contractions)   . Status post dilation of esophageal narrowing     Past Surgical History:  Procedure Laterality Date  . ABDOMINAL HYSTERECTOMY  1974  . APPENDECTOMY  1977  . BLADDER SURGERY  1974  . BREAST EXCISIONAL BIOPSY    . CATARACT EXTRACTION Bilateral 2015  . CHOLECYSTECTOMY  1987  . removal of melanoma in situ  FAMHx:  Family History  Problem Relation Age of Onset  . Hypertension Mother   . Thyroid disease Mother   . Arthritis Mother   . Depression Mother   . Hearing loss Mother   . Miscarriages / [place] Mother   . Heart attack Father   . Heart disease Father   . Alcohol abuse Father   . Arthritis Father   . Depression Father   . Drug abuse Father   . Hypertension Father   . Alcohol abuse Sister   . Arthritis Sister   . Depression Sister   . Hearing loss Sister   . Hypotension Sister   . Hypertension Maternal Aunt   . Hypertension  Maternal Uncle   . Clotting disorder Maternal Uncle   . Prostate cancer Maternal Uncle   . Colon cancer Maternal Uncle   . Colon polyps Maternal Uncle   . Diabetes Maternal Uncle        all uncles  . Arthritis Maternal Grandmother   . Asthma Maternal Grandmother   . Breast cancer Maternal Grandmother   . Stomach cancer Maternal Grandmother   . Hearing loss Maternal Grandmother   . Diabetes Maternal Grandfather   . Heart disease Maternal Grandfather   . Arthritis Maternal Grandfather   . Hearing loss Maternal Grandfather   . Hypertension Maternal Grandfather   . Kidney disease Maternal Grandfather   . Hyperlipidemia Paternal Grandmother        also MI  . Breast cancer Paternal Grandmother   . Arthritis Paternal Grandmother   . Heart attack Paternal Grandmother   . Hyperlipidemia Paternal Grandfather        also MI  . Heart disease Paternal Grandfather   . Heart attack Paternal Grandfather   . Depression Sister   . Heart disease Sister   . Hyperlipidemia Sister   . Hypertension Son   . Endometriosis Daughter     SOCHx:   reports that she has never smoked. She has never used smokeless tobacco. She reports that she does not drink alcohol or use drugs.  ALLERGIES:  Allergies  Allergen Reactions  . Codeine Phosphate Nausea And Vomiting    REACTION: unspecified  . Prednisone Shortness Of Breath  . Betadine [Povidone Iodine] Hives    After surgical skin prep  . Sulfa Antibiotics Nausea And Vomiting  . Iodine Rash    Broke out on skin once years ago--**Betadine Skin Prep**    MEDS:  Current Meds  Medication Sig  . albuterol (PROAIR HFA) 108 (90 Base) MCG/ACT inhaler Inhale 1-2 puffs into the lungs every 4 (four) hours as needed.  . budesonide (RHINOCORT ALLERGY) 32 MCG/ACT nasal spray Place 1 spray into both nostrils daily as needed for allergies.   . carvedilol (COREG) 12.5 MG tablet Take 12.5 mg by mouth 2 (two) times daily with a meal.  . cholecalciferol (VITAMIN D)  1000 UNITS tablet Take 2,000 Units by mouth daily.   . citalopram (CELEXA) 10 MG tablet Take 1/2 (one-half) tablet by mouth once daily  . cycloSPORINE (RESTASIS) 0.05 % ophthalmic emulsion Place 1 drop into both eyes 2 (two) times daily.   Marland Kitchen estradiol (VIVELLE-DOT) 0.05 MG/24HR patch APPLY 1 PATCH TOPICALLY ONTO THE SKIN 2 TIMES A WEEK  . famotidine (PEPCID) 40 MG tablet Take 1 tablet (40 mg total) by mouth daily. (Patient taking differently: Take 20 mg by mouth daily. )  . Flaxseed, Linseed, (FLAX SEED OIL) 1000 MG CAPS Take 1 capsule by mouth daily.   Marland Kitchen  Fluticasone Furoate (ARNUITY ELLIPTA) 200 MCG/ACT AEPB Inhale 1 puff into the lungs daily.  . hydrochlorothiazide (HYDRODIURIL) 25 MG tablet Take 1 tablet (25 mg total) by mouth daily.  . irbesartan (AVAPRO) 150 MG tablet Take 1 tablet (150 mg total) by mouth daily.  Marland Kitchen levothyroxine (SYNTHROID) 50 MCG tablet Take 1 tablet (50 mcg total) by mouth daily before breakfast.  . Omega-3 Fatty Acids (FISH OIL) 1000 MG CAPS Take 1 capsule by mouth daily.   . potassium chloride (K-DUR) 10 MEQ tablet Take 1 tablet by mouth once daily  . TURMERIC PO Take by mouth.  . Zinc 100 MG TABS Take 1 tablet by mouth daily.  . [DISCONTINUED] carvedilol (COREG) 12.5 MG tablet TAKE ONE TABLET IN THE MORNING AND 1/2 TAB AT NIGHT need appt for further refills (Patient taking differently: Take 12.5 mg by mouth 2 (two) times daily with a meal. )     ROS: Pertinent items noted in HPI and remainder of comprehensive ROS otherwise negative.  Labs/Other Tests and Data Reviewed:    Recent Labs: 12/08/2018: ALT 14; ALT 14; BUN 16; Creatinine, Ser 0.86; Hemoglobin 13.1; Platelets 242.0; Potassium 4.7; Sodium 138; TSH 7.22   Recent Lipid Panel Lab Results  Component Value Date/Time   CHOL 218 (H) 01/15/2011 08:24 AM   TRIG 113.0 01/15/2011 08:24 AM   HDL 52.70 01/15/2011 08:24 AM   CHOLHDL 4 01/15/2011 08:24 AM   LDLCALC 118 (H) 06/04/2007 10:35 AM   LDLDIRECT 143.5  01/15/2011 08:24 AM    Wt Readings from Last 3 Encounters:  08/23/19 189 lb (85.7 kg)  12/08/18 183 lb (83 kg)  06/03/18 173 lb (78.5 kg)     Exam:    Vital Signs:  BP (!) 157/78   Pulse 60   Ht 5\' 4"  (1.626 m)   Wt 189 lb (85.7 kg)   BMI 32.44 kg/m    Exam not performed due to telephone visit  ASSESSMENT & PLAN:    1. PVCs 2. Hypertension 3. Dyslipidemia 4. Prediabetes 5. Hypothyroidism  Ms.Wenrich continues to have an elevated blood pressure which may be related to increase in weight.  Previously she was on high-dose irbesartan.  I would recommend we increase that from 150 to 300 mg daily.  She should monitor blood pressures and if this is well-tolerated and she gets to target then will continue this.  We will continue her current dose of carvedilol as her PVCs are well controlled.  Cholesterol however is elevated.  Her LDL was 145 in February 2019.  She has been prediabetic with A1c about 6.1 or 6.2.  We will need to recheck a lipid profile.  I would favor of course work on dietary changes and weight loss but likely she will need to be on a statin as well.  Follow-up with me annually or sooner as necessary.  COVID-19 Education: The signs and symptoms of COVID-19 were discussed with the patient and how to seek care for testing (follow up with PCP or arrange E-visit).  The importance of social distancing was discussed today.  Patient Risk:   After full review of this patients clinical status, I feel that they are at least moderate risk at this time.  Time:   Today, I have spent 25 minutes with the patient with telehealth technology discussing dyslipidemia, hypertension, PVCs, prediabetes.     Medication Adjustments/Labs and Tests Ordered: Current medicines are reviewed at length with the patient today.  Concerns regarding medicines are outlined above.  Tests Ordered: No orders of the defined types were placed in this encounter.   Medication Changes: No orders of  the defined types were placed in this encounter.   Disposition:  in 1 year(s)  Pixie Casino, MD, [facility], [place] Director of the Advanced Lipid Disorders &  Cardiovascular Risk Reduction Clinic Diplomate of the American Board of Clinical Lipidology Attending Cardiologist  Direct Dial: [phone]  Fax: [phone]  Website:  [email or web address]  Pixie Casino, MD  08/23/2019 9:22 AM

## 2019-08-30 NOTE — Telephone Encounter (Signed)
LM for patient to confirm AVS instructions were received & understood. Asked for call back with questions/concerns.

## 2019-09-24 ENCOUNTER — Ambulatory Visit (INDEPENDENT_AMBULATORY_CARE_PROVIDER_SITE_OTHER): Payer: PPO | Admitting: Adult Health

## 2019-09-24 ENCOUNTER — Other Ambulatory Visit: Payer: Self-pay

## 2019-09-24 ENCOUNTER — Encounter: Payer: Self-pay | Admitting: Adult Health

## 2019-09-24 VITALS — BP 130/68 | HR 65 | Temp 97.6°F | Wt 195.0 lb

## 2019-09-24 DIAGNOSIS — R600 Localized edema: Secondary | ICD-10-CM

## 2019-09-24 DIAGNOSIS — F419 Anxiety disorder, unspecified: Secondary | ICD-10-CM | POA: Diagnosis not present

## 2019-09-24 LAB — CBC WITH DIFFERENTIAL/PLATELET
Basophils Absolute: 0.1 10*3/uL (ref 0.0–0.1)
Basophils Relative: 0.9 % (ref 0.0–3.0)
Eosinophils Absolute: 0.2 10*3/uL (ref 0.0–0.7)
Eosinophils Relative: 2.7 % (ref 0.0–5.0)
HCT: 41.9 % (ref 36.0–46.0)
Hemoglobin: 13.6 g/dL (ref 12.0–15.0)
Lymphocytes Relative: 20.8 % (ref 12.0–46.0)
Lymphs Abs: 1.4 10*3/uL (ref 0.7–4.0)
MCHC: 32.4 g/dL (ref 30.0–36.0)
MCV: 91.6 fl (ref 78.0–100.0)
Monocytes Absolute: 0.9 10*3/uL (ref 0.1–1.0)
Monocytes Relative: 13.7 % — ABNORMAL HIGH (ref 3.0–12.0)
Neutro Abs: 4.2 10*3/uL (ref 1.4–7.7)
Neutrophils Relative %: 61.9 % (ref 43.0–77.0)
Platelets: 226 10*3/uL (ref 150.0–400.0)
RBC: 4.57 Mil/uL (ref 3.87–5.11)
RDW: 14.4 % (ref 11.5–15.5)
WBC: 6.8 10*3/uL (ref 4.0–10.5)

## 2019-09-24 LAB — COMPREHENSIVE METABOLIC PANEL
ALT: 16 U/L (ref 0–35)
AST: 16 U/L (ref 0–37)
Albumin: 4.2 g/dL (ref 3.5–5.2)
Alkaline Phosphatase: 76 U/L (ref 39–117)
BUN: 16 mg/dL (ref 6–23)
CO2: 28 mEq/L (ref 19–32)
Calcium: 10 mg/dL (ref 8.4–10.5)
Chloride: 104 mEq/L (ref 96–112)
Creatinine, Ser: 0.86 mg/dL (ref 0.40–1.20)
GFR: 65.48 mL/min (ref >60.00)
Glucose, Bld: 97 mg/dL (ref 70–99)
Potassium: 4.4 mEq/L (ref 3.5–5.1)
Sodium: 141 mEq/L (ref 135–145)
Total Bilirubin: 0.5 mg/dL (ref 0.2–1.2)
Total Protein: 6.6 g/dL (ref 6.0–8.3)

## 2019-09-24 MED ORDER — CITALOPRAM HYDROBROMIDE 10 MG PO TABS: 10.0000 mg | ORAL_TABLET | Freq: Every day | ORAL | 1 refills | Status: DC

## 2019-09-24 NOTE — Progress Notes (Signed)
Subjective:    Patient ID: Amber Brewer, female    DOB: 1951/09/20, 68 y.o.   MRN: [id number]  HPI  68 year old female who  has a past medical history of Asthma, Chicken pox, DDD (degenerative disc disease), cervical, Depression, Gallstones, GERD (gastroesophageal reflux disease), melanoma in situ (2002), UTI (urinary tract infection), Hyperlipidemia, Hypertension, Hypothyroidism, IBS (irritable bowel syndrome), Melanoma ([place]), Nephrolithiasis, Osteoarthritis, PVC's (premature ventricular contractions), and Status post dilation of esophageal narrowing.  She presents to the office today for bilateral lower extremity swelling.  She reports that her symptoms have been intermittent since June 2020 but over the last 2 to 3 weeks the swelling has become more constant.  Swelling does not necessarily go away after elevating legs.  He is taking hydrochlorothiazide as directed.  She denies calf tenderness redness, or warmth.  Has been trying to make more conscious efforts on her diet but does eat out often.  At home she tries to limit her salt intake.  She is drinking plenty of water.  There have been no medication changes but she does use an estrogen patch.  She denies chest pain, shortness of breath, or dizziness   She would like to also increase Celexa from 5 mg to 10 mg to get a little bit better control of anxiety.  She is not experiencing anxiety attacks but feels as though she has had some increased anxiety when having to deal with the health of her mother.   Review of Systems See HPI   Past Medical History:  Diagnosis Date  . Asthma   . Chicken pox   . DDD (degenerative disc disease), cervical   . Depression   . Gallstones   . GERD (gastroesophageal reflux disease)   . Hx of melanoma in situ 2002  . Hx: UTI (urinary tract infection)   . Hyperlipidemia   . Hypertension   . Hypothyroidism   . IBS (irritable bowel syndrome)   . Melanoma ([place])    x2   . Nephrolithiasis    . Osteoarthritis   . PVC's (premature ventricular contractions)   . Status post dilation of esophageal narrowing     Social History   Socioeconomic History  . Marital status: Married    Spouse name: Not on file  . Number of children: 2  . Years of education: 87  . Highest education level: Not on file  Occupational History  . Occupation: licensed Engineer, site: OTHER    Comment: Office manager  Social Needs  . Financial resource strain: Not on file  . Food insecurity    Worry: Not on file    Inability: Not on file  . Transportation needs    Medical: Not on file    Non-medical: Not on file  Tobacco Use  . Smoking status: Never Smoker  . Smokeless tobacco: Never Used  Substance and Sexual Activity  . Alcohol use: No  . Drug use: No  . Sexual activity: Yes    Birth control/protection: Post-menopausal, Surgical  Lifestyle  . Physical activity    Days per week: Not on file    Minutes per session: Not on file  . Stress: Not on file  Relationships  . Social Herbalist on phone: Not on file    Gets together: Not on file    Attends religious service: Not on file    Active member of club or organization: Not on file    Attends meetings of clubs  or organizations: Not on file    Relationship status: Not on file  . Intimate partner violence    Fear of current or ex partner: Not on file    Emotionally abused: Not on file    Physically abused: Not on file    Forced sexual activity: Not on file  Other Topics Concern  . Not on file  Social History Narrative   epworth sleepiness scale = 8 (01/18/2016)   Lives at home with her husband and disabled son   Right handed   Caffeine: decaf    Past Surgical History:  Procedure Laterality Date  . ABDOMINAL HYSTERECTOMY  1974  . APPENDECTOMY  1977  . BLADDER SURGERY  1974  . BREAST EXCISIONAL BIOPSY    . CATARACT EXTRACTION Bilateral 2015  . CHOLECYSTECTOMY  1987  . removal of melanoma in situ       Family History  Problem Relation Age of Onset  . Hypertension Mother   . Thyroid disease Mother   . Arthritis Mother   . Depression Mother   . Hearing loss Mother   . Miscarriages / [place] Mother   . Heart attack Father   . Heart disease Father   . Alcohol abuse Father   . Arthritis Father   . Depression Father   . Drug abuse Father   . Hypertension Father   . Alcohol abuse Sister   . Arthritis Sister   . Depression Sister   . Hearing loss Sister   . Hypotension Sister   . Hypertension Maternal Aunt   . Hypertension Maternal Uncle   . Clotting disorder Maternal Uncle   . Prostate cancer Maternal Uncle   . Colon cancer Maternal Uncle   . Colon polyps Maternal Uncle   . Diabetes Maternal Uncle        all uncles  . Arthritis Maternal Grandmother   . Asthma Maternal Grandmother   . Breast cancer Maternal Grandmother   . Stomach cancer Maternal Grandmother   . Hearing loss Maternal Grandmother   . Diabetes Maternal Grandfather   . Heart disease Maternal Grandfather   . Arthritis Maternal Grandfather   . Hearing loss Maternal Grandfather   . Hypertension Maternal Grandfather   . Kidney disease Maternal Grandfather   . Hyperlipidemia Paternal Grandmother        also MI  . Breast cancer Paternal Grandmother   . Arthritis Paternal Grandmother   . Heart attack Paternal Grandmother   . Hyperlipidemia Paternal Grandfather        also MI  . Heart disease Paternal Grandfather   . Heart attack Paternal Grandfather   . Depression Sister   . Heart disease Sister   . Hyperlipidemia Sister   . Hypertension Son   . Endometriosis Daughter     Allergies  Allergen Reactions  . Codeine Phosphate Nausea And Vomiting    REACTION: unspecified  . Prednisone Shortness Of Breath  . Betadine [Povidone Iodine] Hives    After surgical skin prep  . Sulfa Antibiotics Nausea And Vomiting  . Iodine Rash    Broke out on skin once years ago--**Betadine Skin Prep**    Current  Outpatient Medications on File Prior to Visit  Medication Sig Dispense Refill  . albuterol (PROAIR HFA) 108 (90 Base) MCG/ACT inhaler Inhale 1-2 puffs into the lungs every 4 (four) hours as needed. 1 Inhaler 1  . budesonide (RHINOCORT ALLERGY) 32 MCG/ACT nasal spray Place 1 spray into both nostrils daily as needed for allergies.     Marland Kitchen  carvedilol (COREG) 12.5 MG tablet Take 1 tablet (12.5 mg total) by mouth 2 (two) times daily with a meal. 180 tablet 3  . cholecalciferol (VITAMIN D) 1000 UNITS tablet Take 2,000 Units by mouth daily.     . cycloSPORINE (RESTASIS) 0.05 % ophthalmic emulsion Place 1 drop into both eyes 2 (two) times daily.     Marland Kitchen estradiol (VIVELLE-DOT) 0.05 MG/24HR patch APPLY 1 PATCH TOPICALLY ONTO THE SKIN 2 TIMES A WEEK 24 patch 2  . famotidine (PEPCID) 40 MG tablet Take 1 tablet (40 mg total) by mouth daily. (Patient taking differently: Take 20 mg by mouth daily. ) 90 tablet 1  . Flaxseed, Linseed, (FLAX SEED OIL) 1000 MG CAPS Take 1 capsule by mouth daily.     . Fluticasone Furoate (ARNUITY ELLIPTA) 200 MCG/ACT AEPB Inhale 1 puff into the lungs daily. 90 each 1  . hydrochlorothiazide (HYDRODIURIL) 25 MG tablet Take 1 tablet (25 mg total) by mouth daily. 90 tablet 3  . levothyroxine (SYNTHROID) 50 MCG tablet Take 1 tablet (50 mcg total) by mouth daily before breakfast. 90 tablet 0  . Omega-3 Fatty Acids (FISH OIL) 1000 MG CAPS Take 1 capsule by mouth daily.     . potassium chloride (K-DUR) 10 MEQ tablet Take 1 tablet by mouth once daily 90 tablet 3  . TURMERIC PO Take by mouth.    . Zinc 100 MG TABS Take 1 tablet by mouth daily.    . irbesartan (AVAPRO) 150 MG tablet Take 300 mg by mouth daily.     No current facility-administered medications on file prior to visit.     BP 130/68 (BP Location: Right Arm, Patient Position: Sitting, Cuff Size: Normal)   Pulse 65   Temp 97.6 F (36.4 C) (Temporal)   Wt 195 lb (88.5 kg)   SpO2 96%   BMI 33.47 kg/m       Objective:    Physical Exam Vitals signs and nursing note reviewed.  Constitutional:      Appearance: Normal appearance.  Cardiovascular:     Rate and Rhythm: Normal rate and regular rhythm.     Pulses: Normal pulses.     Heart sounds: Normal heart sounds.     Comments: Pitting edema noted bilaterally from ankles to mid shin Musculoskeletal:     Right lower leg: 1+ Pitting Edema present.     Left lower leg: 1+ Pitting Edema present.  Skin:    Capillary Refill: Capillary refill takes less than 2 seconds.  Neurological:     General: No focal deficit present.     Mental Status: She is alert and oriented to person, place, and time.  Psychiatric:        Mood and Affect: Mood normal.        Behavior: Behavior normal.        Thought Content: Thought content normal.        Judgment: Judgment normal.       Assessment & Plan:  1. Lower extremity edema -CBC and CMP.  Likely due to sodium intake or estrogen patch.  Over the next 7 to 10 days we will have her cut back on sodium and eating out. If no improvement then will wean back on estrogen patch.  - Consider referral to vein and vascular . - CBC with Differential/Platelet - CMP  2. Anxiety  - citalopram (CELEXA) 10 MG tablet; Take 1 tablet (10 mg total) by mouth daily.  Dispense: 90 tablet; Refill: 1  [name], NP

## 2019-09-29 DIAGNOSIS — Z23 Encounter for immunization: Secondary | ICD-10-CM | POA: Diagnosis not present

## 2019-09-29 DIAGNOSIS — L821 Other seborrheic keratosis: Secondary | ICD-10-CM | POA: Diagnosis not present

## 2019-09-29 DIAGNOSIS — Z808 Family history of malignant neoplasm of other organs or systems: Secondary | ICD-10-CM | POA: Diagnosis not present

## 2019-09-29 DIAGNOSIS — B078 Other viral warts: Secondary | ICD-10-CM | POA: Diagnosis not present

## 2019-09-29 DIAGNOSIS — D2361 Other benign neoplasm of skin of right upper limb, including shoulder: Secondary | ICD-10-CM | POA: Diagnosis not present

## 2019-09-29 DIAGNOSIS — L570 Actinic keratosis: Secondary | ICD-10-CM | POA: Diagnosis not present

## 2019-09-29 DIAGNOSIS — D485 Neoplasm of uncertain behavior of skin: Secondary | ICD-10-CM | POA: Diagnosis not present

## 2019-09-29 DIAGNOSIS — D225 Melanocytic nevi of trunk: Secondary | ICD-10-CM | POA: Diagnosis not present

## 2019-09-29 DIAGNOSIS — Z86018 Personal history of other benign neoplasm: Secondary | ICD-10-CM | POA: Diagnosis not present

## 2019-09-29 DIAGNOSIS — D2271 Melanocytic nevi of right lower limb, including hip: Secondary | ICD-10-CM | POA: Diagnosis not present

## 2019-09-29 DIAGNOSIS — D2371 Other benign neoplasm of skin of right lower limb, including hip: Secondary | ICD-10-CM | POA: Diagnosis not present

## 2019-09-29 DIAGNOSIS — Z87898 Personal history of other specified conditions: Secondary | ICD-10-CM | POA: Diagnosis not present

## 2019-09-29 DIAGNOSIS — Z85828 Personal history of other malignant neoplasm of skin: Secondary | ICD-10-CM | POA: Diagnosis not present

## 2019-10-04 ENCOUNTER — Encounter: Payer: Self-pay | Admitting: Adult Health

## 2019-10-08 ENCOUNTER — Other Ambulatory Visit: Payer: Self-pay | Admitting: Adult Health

## 2019-10-08 ENCOUNTER — Other Ambulatory Visit: Payer: Self-pay

## 2019-10-08 ENCOUNTER — Ambulatory Visit
Admission: RE | Admit: 2019-10-08 | Discharge: 2019-10-08 | Disposition: A | Discharge status: Home / Self Care | Payer: PPO | Source: Ambulatory Visit | Attending: Adult Health | Admitting: Adult Health

## 2019-10-08 DIAGNOSIS — M79604 Pain in right leg: Secondary | ICD-10-CM

## 2019-10-08 DIAGNOSIS — Z1231 Encounter for screening mammogram for malignant neoplasm of breast: Secondary | ICD-10-CM | POA: Diagnosis not present

## 2019-10-08 DIAGNOSIS — M79605 Pain in left leg: Secondary | ICD-10-CM

## 2019-10-18 ENCOUNTER — Telehealth: Payer: Self-pay | Admitting: Internal Medicine

## 2019-10-18 ENCOUNTER — Telehealth: Payer: Self-pay

## 2019-10-18 MED ORDER — IRBESARTAN 150 MG PO TABS: 300.0000 mg | ORAL_TABLET | Freq: Every day | ORAL | 3 refills | Status: DC

## 2019-10-18 NOTE — Telephone Encounter (Signed)
90 day prescription of Irbesartan sent into patient's pharmacy. Pharmacy required a script with a quantity of 180 -150mg  tablets.

## 2019-10-18 NOTE — Telephone Encounter (Signed)
Pt c/o medication issue:  1. Name of Medication: irbesartan (AVAPRO) 150 MG tablet  2. How are you currently taking this medication (dosage and times per day)? Twice a day  3. Are you having a reaction (difficulty breathing--STAT)? no  4. What is your medication issue?  Patient states her insurance will not cover the medication because it was sent in wrong. She states she would need it to be changed to 90 300mg  tablets  or be given 180 tablets of the 150mg .

## 2019-10-18 NOTE — Telephone Encounter (Signed)
Patient had appointment with Dr. Debara Pickett in November of 2020. Orders to follow up in one year given, per visit notes and AVS Irbesartan increased to 300mg . Patient requesting refill. Refill sent to [facility] per patient request.

## 2019-11-01 ENCOUNTER — Encounter: Payer: Self-pay | Admitting: Podiatry

## 2019-11-01 ENCOUNTER — Ambulatory Visit: Payer: PPO | Admitting: Podiatry

## 2019-11-01 ENCOUNTER — Ambulatory Visit (INDEPENDENT_AMBULATORY_CARE_PROVIDER_SITE_OTHER): Payer: PPO

## 2019-11-01 ENCOUNTER — Other Ambulatory Visit: Payer: Self-pay

## 2019-11-01 DIAGNOSIS — M7741 Metatarsalgia, right foot: Secondary | ICD-10-CM

## 2019-11-01 DIAGNOSIS — M779 Enthesopathy, unspecified: Secondary | ICD-10-CM

## 2019-11-01 DIAGNOSIS — M7742 Metatarsalgia, left foot: Secondary | ICD-10-CM

## 2019-11-01 DIAGNOSIS — M216X9 Other acquired deformities of unspecified foot: Secondary | ICD-10-CM | POA: Diagnosis not present

## 2019-11-01 DIAGNOSIS — M7751 Other enthesopathy of right foot: Secondary | ICD-10-CM

## 2019-11-01 DIAGNOSIS — M7752 Other enthesopathy of left foot: Secondary | ICD-10-CM

## 2019-11-01 MED ORDER — MELOXICAM 15 MG PO TABS: 15.0000 mg | ORAL_TABLET | Freq: Every day | ORAL | 0 refills | Status: DC

## 2019-11-08 NOTE — Progress Notes (Signed)
Subjective:   Patient ID: Amber Brewer, female   DOB: 69 y.o.   MRN: [id number]   HPI 69 year old female presents the office today for concerns of bilateral foot pain the left side worse than the right.  She states that she feels that she is walking on marbles.  She also states on the left side she started to get pain and she points the lateral aspect of her foot and she states it started to move around her foot as well and she occasionally gets swelling and soreness.  She said no recent treatment.  No recent injury.  She states that in June 2020 felt that she had a stone bruise.  She has no other concerns today.   Review of Systems  All other systems reviewed and are negative.  Past Medical History:  Diagnosis Date  . Asthma   . Chicken pox   . DDD (degenerative disc disease), cervical   . Depression   . Gallstones   . GERD (gastroesophageal reflux disease)   . Hx of melanoma in situ 2002  . Hx: UTI (urinary tract infection)   . Hyperlipidemia   . Hypertension   . Hypothyroidism   . IBS (irritable bowel syndrome)   . Melanoma ([place])    x2   . Nephrolithiasis   . Osteoarthritis   . PVC's (premature ventricular contractions)   . Status post dilation of esophageal narrowing     Past Surgical History:  Procedure Laterality Date  . ABDOMINAL HYSTERECTOMY  1974  . APPENDECTOMY  1977  . BLADDER SURGERY  1974  . BREAST EXCISIONAL BIOPSY    . CATARACT EXTRACTION Bilateral 2015  . CHOLECYSTECTOMY  1987  . removal of melanoma in situ       Current Outpatient Medications:  .  albuterol (PROAIR HFA) 108 (90 Base) MCG/ACT inhaler, Inhale 1-2 puffs into the lungs every 4 (four) hours as needed., Disp: 1 Inhaler, Rfl: 1 .  budesonide (RHINOCORT ALLERGY) 32 MCG/ACT nasal spray, Place 1 spray into both nostrils daily as needed for allergies. , Disp: , Rfl:  .  carvedilol (COREG) 12.5 MG tablet, Take 1 tablet (12.5 mg total) by mouth 2 (two) times daily with a meal., Disp: 180  tablet, Rfl: 3 .  cholecalciferol (VITAMIN D) 1000 UNITS tablet, Take 2,000 Units by mouth daily. , Disp: , Rfl:  .  citalopram (CELEXA) 10 MG tablet, Take 1 tablet (10 mg total) by mouth daily., Disp: 90 tablet, Rfl: 1 .  cycloSPORINE (RESTASIS) 0.05 % ophthalmic emulsion, Place 1 drop into both eyes 2 (two) times daily. , Disp: , Rfl:  .  estradiol (VIVELLE-DOT) 0.05 MG/24HR patch, APPLY 1 PATCH TOPICALLY ONTO THE SKIN 2 TIMES A WEEK, Disp: 24 patch, Rfl: 2 .  famotidine (PEPCID) 40 MG tablet, Take 1 tablet (40 mg total) by mouth daily. (Patient taking differently: Take 20 mg by mouth daily. ), Disp: 90 tablet, Rfl: 1 .  Flaxseed, Linseed, (FLAX SEED OIL) 1000 MG CAPS, Take 1 capsule by mouth daily. , Disp: , Rfl:  .  Fluticasone Furoate (ARNUITY ELLIPTA) 200 MCG/ACT AEPB, Inhale 1 puff into the lungs daily., Disp: 90 each, Rfl: 1 .  hydrochlorothiazide (HYDRODIURIL) 25 MG tablet, Take 1 tablet (25 mg total) by mouth daily., Disp: 90 tablet, Rfl: 3 .  irbesartan (AVAPRO) 150 MG tablet, Take 2 tablets (300 mg total) by mouth daily., Disp: 180 tablet, Rfl: 3 .  levothyroxine (SYNTHROID) 50 MCG tablet, Take 1 tablet (50 mcg total)  by mouth daily before breakfast., Disp: 90 tablet, Rfl: 0 .  meloxicam (MOBIC) 15 MG tablet, Take 1 tablet (15 mg total) by mouth daily., Disp: 30 tablet, Rfl: 0 .  Omega-3 Fatty Acids (FISH OIL) 1000 MG CAPS, Take 1 capsule by mouth daily. , Disp: , Rfl:  .  potassium chloride (K-DUR) 10 MEQ tablet, Take 1 tablet by mouth once daily, Disp: 90 tablet, Rfl: 3 .  TURMERIC PO, Take by mouth., Disp: , Rfl:  .  Zinc 100 MG TABS, Take 1 tablet by mouth daily., Disp: , Rfl:   Allergies  Allergen Reactions  . Codeine Phosphate Nausea And Vomiting    REACTION: unspecified  . Prednisone Shortness Of Breath  . Betadine [Povidone Iodine] Hives    After surgical skin prep  . Sulfa Antibiotics Nausea And Vomiting  . Iodine Rash    Broke out on skin once years ago--**Betadine  Skin Prep**         Objective:  Physical Exam  General: AAO x3, NAD  Dermatological: Skin is warm, dry and supple bilateral. Nails x 10 are well manicured; remaining integument appears unremarkable at this time. There are no open sores, no preulcerative lesions, no rash or signs of infection present.  Vascular: Dorsalis Pedis artery and Posterior Tibial artery pedal pulses are 2/4 bilateral with immedate capillary fill time. Pedal hair growth present. No varicosities and no lower extremity edema present bilateral. There is no pain with calf compression, swelling, warmth, erythema.   Neruologic: Grossly intact via light touch bilateral. Protective threshold with Semmes Wienstein monofilament intact to all pedal sites bilateral.  Negative Tinel sign.  Musculoskeletal: There is prominence the metatarsal heads plantarly with atrophy of the fat pad.  On the left side there is mild discomfort on the course of the peroneal tendon insertion of the fifth metatarsal base.  Subjectively she is getting discomfort as well along the Achilles tendon.  Thompson test is negative.  There is no area of pinpoint tenderness.  Minimal swelling.  There is no erythema or warmth.  Muscular strength 5/5 in all groups tested bilateral.  Gait: Unassisted, Nonantalgic.       Assessment:   69 year old female with metatarsalgia, tendinitis     Plan:  -Treatment options discussed including all alternatives, risks, and complications -Etiology of symptoms were discussed -X-rays were obtained and reviewed with the patient.  There is no evidence of acute fracture or stress fracture identified today. -Prescribed mobic. Discussed side effects of the medication and directed to stop if any are to occur and call the office.  -Dispensed metatarsal offloading pads for now.  Also we discussed orthotics and she was seen today by Liliane Channel for measurement of inserts.  Discussed shoe modifications as well.  Discussed general stretching  exercises.  Trula Slade DPM

## 2019-11-09 DIAGNOSIS — H04332 Acute lacrimal canaliculitis of left lacrimal passage: Secondary | ICD-10-CM | POA: Diagnosis not present

## 2019-11-12 DIAGNOSIS — M25672 Stiffness of left ankle, not elsewhere classified: Secondary | ICD-10-CM | POA: Diagnosis not present

## 2019-11-12 DIAGNOSIS — M25572 Pain in left ankle and joints of left foot: Secondary | ICD-10-CM | POA: Diagnosis not present

## 2019-11-12 DIAGNOSIS — M25671 Stiffness of right ankle, not elsewhere classified: Secondary | ICD-10-CM | POA: Diagnosis not present

## 2019-11-12 DIAGNOSIS — M25571 Pain in right ankle and joints of right foot: Secondary | ICD-10-CM | POA: Diagnosis not present

## 2019-11-15 DIAGNOSIS — M25672 Stiffness of left ankle, not elsewhere classified: Secondary | ICD-10-CM | POA: Diagnosis not present

## 2019-11-15 DIAGNOSIS — M25572 Pain in left ankle and joints of left foot: Secondary | ICD-10-CM | POA: Diagnosis not present

## 2019-11-15 DIAGNOSIS — M25671 Stiffness of right ankle, not elsewhere classified: Secondary | ICD-10-CM | POA: Diagnosis not present

## 2019-11-15 DIAGNOSIS — M25571 Pain in right ankle and joints of right foot: Secondary | ICD-10-CM | POA: Diagnosis not present

## 2019-11-18 ENCOUNTER — Other Ambulatory Visit: Payer: Self-pay | Admitting: Adult Health

## 2019-11-19 ENCOUNTER — Other Ambulatory Visit: Payer: Self-pay | Admitting: Allergy and Immunology

## 2019-11-19 DIAGNOSIS — M25672 Stiffness of left ankle, not elsewhere classified: Secondary | ICD-10-CM | POA: Diagnosis not present

## 2019-11-19 DIAGNOSIS — M25571 Pain in right ankle and joints of right foot: Secondary | ICD-10-CM | POA: Diagnosis not present

## 2019-11-19 DIAGNOSIS — M25572 Pain in left ankle and joints of left foot: Secondary | ICD-10-CM | POA: Diagnosis not present

## 2019-11-19 DIAGNOSIS — M25671 Stiffness of right ankle, not elsewhere classified: Secondary | ICD-10-CM | POA: Diagnosis not present

## 2019-11-19 MED ORDER — LEVOTHYROXINE SODIUM 50 MCG PO TABS: 50.0000 ug | ORAL_TABLET | Freq: Every day | ORAL | 0 refills | Status: DC

## 2019-11-22 DIAGNOSIS — M25671 Stiffness of right ankle, not elsewhere classified: Secondary | ICD-10-CM | POA: Diagnosis not present

## 2019-11-22 DIAGNOSIS — M25672 Stiffness of left ankle, not elsewhere classified: Secondary | ICD-10-CM | POA: Diagnosis not present

## 2019-11-22 DIAGNOSIS — M25572 Pain in left ankle and joints of left foot: Secondary | ICD-10-CM | POA: Diagnosis not present

## 2019-11-22 DIAGNOSIS — M25571 Pain in right ankle and joints of right foot: Secondary | ICD-10-CM | POA: Diagnosis not present

## 2019-11-26 DIAGNOSIS — M25572 Pain in left ankle and joints of left foot: Secondary | ICD-10-CM | POA: Diagnosis not present

## 2019-11-26 DIAGNOSIS — M25672 Stiffness of left ankle, not elsewhere classified: Secondary | ICD-10-CM | POA: Diagnosis not present

## 2019-11-26 DIAGNOSIS — M25571 Pain in right ankle and joints of right foot: Secondary | ICD-10-CM | POA: Diagnosis not present

## 2019-11-26 DIAGNOSIS — M25671 Stiffness of right ankle, not elsewhere classified: Secondary | ICD-10-CM | POA: Diagnosis not present

## 2019-11-29 ENCOUNTER — Ambulatory Visit (INDEPENDENT_AMBULATORY_CARE_PROVIDER_SITE_OTHER): Payer: PPO | Admitting: Orthotics

## 2019-11-29 ENCOUNTER — Other Ambulatory Visit: Payer: Self-pay

## 2019-11-29 ENCOUNTER — Ambulatory Visit: Payer: PPO | Admitting: Podiatry

## 2019-11-29 ENCOUNTER — Encounter: Payer: Self-pay | Admitting: Podiatry

## 2019-11-29 DIAGNOSIS — D3610 Benign neoplasm of peripheral nerves and autonomic nervous system, unspecified: Secondary | ICD-10-CM

## 2019-11-29 DIAGNOSIS — M779 Enthesopathy, unspecified: Secondary | ICD-10-CM | POA: Diagnosis not present

## 2019-11-29 DIAGNOSIS — M25671 Stiffness of right ankle, not elsewhere classified: Secondary | ICD-10-CM | POA: Diagnosis not present

## 2019-11-29 DIAGNOSIS — M25672 Stiffness of left ankle, not elsewhere classified: Secondary | ICD-10-CM | POA: Diagnosis not present

## 2019-11-29 DIAGNOSIS — M7742 Metatarsalgia, left foot: Secondary | ICD-10-CM | POA: Diagnosis not present

## 2019-11-29 DIAGNOSIS — M7741 Metatarsalgia, right foot: Secondary | ICD-10-CM

## 2019-11-29 DIAGNOSIS — M216X9 Other acquired deformities of unspecified foot: Secondary | ICD-10-CM | POA: Diagnosis not present

## 2019-11-29 DIAGNOSIS — M25571 Pain in right ankle and joints of right foot: Secondary | ICD-10-CM | POA: Diagnosis not present

## 2019-11-29 DIAGNOSIS — G629 Polyneuropathy, unspecified: Secondary | ICD-10-CM | POA: Diagnosis not present

## 2019-11-29 DIAGNOSIS — M25572 Pain in left ankle and joints of left foot: Secondary | ICD-10-CM | POA: Diagnosis not present

## 2019-11-29 MED ORDER — GABAPENTIN 100 MG PO CAPS: 100.0000 mg | ORAL_CAPSULE | Freq: Every day | ORAL | 0 refills | Status: DC

## 2019-11-29 NOTE — Progress Notes (Signed)
Patient came in today to p/up functional foot orthotics.   The orthotics were assessed to both fit and function.  The F/O addressed the biomechanical issues/pathologies as intended, offering good longitudinal arch support, proper offloading, and foot support. There weren't any signs of discomfort or irritation.  The F/O fit properly in footwear with minimal trimming/adjustments. 

## 2019-11-29 NOTE — Patient Instructions (Signed)

## 2019-11-30 ENCOUNTER — Ambulatory Visit: Payer: PPO | Admitting: Allergy and Immunology

## 2019-11-30 NOTE — Progress Notes (Signed)
Subjective: 69 year old female presents the office today for follow-up evaluation of bilateral foot pain and to pick up orthotics.  She still awakens today to pick up orthotics and she states they are very comfortable.  He has been doing physical therapy which is also been helpful.  She was to continue physical therapy as her last session was on Friday.  She states that she still feels "knots" in the balls of the foot which are uncomfortable.  She does have new symptoms and she started to notice some tingling going up the legs and this appears to be symmetrical on both sides.  This is mostly at nighttime and wakes her up around 4 AM.  She said no recent treatment for this.  She is prediabetic.  Denies any back pain or other joint pain. Denies any systemic complaints such as fevers, chills, nausea, vomiting. No acute changes since last appointment, and no other complaints at this time.   Objective: AAO x3, NAD DP/PT pulses palpable bilaterally, CRT less than 3 seconds Sensation intact with Semmes Weinstein monofilament, vibratory sensation intact. There is tenderness on the third interspace in the left foot but no palpable neuroma identified to this area.  There does appear to be neuroma versus first in the fourth interspace between the fourth and fifth metatarsal heads.  There is no edema, erythema.  There is prominence the metatarsal heads plantarly with atrophy of the fat pad.  No significant discomfort on the right side no palpable neuroma or bursa on the right side. No open lesions or pre-ulcerative lesions.  No pain with calf compression, swelling, warmth, erythema  Assessment: 69 year old female with metatarsalgia, possible neuroma left foot; neuropathy  Plan: -All treatment options discussed with the patient including all alternatives, risks, complications.  -Discuss steroid injection but she wants to hold off.  We will continue with orthotics which she picked up today.  Break-in instructions  were discussed.  Rest and physical therapy visits is also been helpful. -Order nerve conduction test given new onset tingling bilaterally although discussed neuropathy.  Will start gabapentin. Discussed side affects.  -Patient encouraged to call the office with any questions, concerns, change in symptoms.   Trula Slade DPM

## 2019-12-02 ENCOUNTER — Other Ambulatory Visit: Payer: Self-pay

## 2019-12-02 DIAGNOSIS — G629 Polyneuropathy, unspecified: Secondary | ICD-10-CM

## 2019-12-02 NOTE — Progress Notes (Signed)
Nerve conduction test order entered

## 2019-12-03 DIAGNOSIS — M25672 Stiffness of left ankle, not elsewhere classified: Secondary | ICD-10-CM | POA: Diagnosis not present

## 2019-12-03 DIAGNOSIS — M25671 Stiffness of right ankle, not elsewhere classified: Secondary | ICD-10-CM | POA: Diagnosis not present

## 2019-12-03 DIAGNOSIS — M25572 Pain in left ankle and joints of left foot: Secondary | ICD-10-CM | POA: Diagnosis not present

## 2019-12-03 DIAGNOSIS — M25571 Pain in right ankle and joints of right foot: Secondary | ICD-10-CM | POA: Diagnosis not present

## 2019-12-06 DIAGNOSIS — M25572 Pain in left ankle and joints of left foot: Secondary | ICD-10-CM | POA: Diagnosis not present

## 2019-12-06 DIAGNOSIS — M25671 Stiffness of right ankle, not elsewhere classified: Secondary | ICD-10-CM | POA: Diagnosis not present

## 2019-12-06 DIAGNOSIS — M25571 Pain in right ankle and joints of right foot: Secondary | ICD-10-CM | POA: Diagnosis not present

## 2019-12-06 DIAGNOSIS — M25672 Stiffness of left ankle, not elsewhere classified: Secondary | ICD-10-CM | POA: Diagnosis not present

## 2019-12-10 DIAGNOSIS — M25572 Pain in left ankle and joints of left foot: Secondary | ICD-10-CM | POA: Diagnosis not present

## 2019-12-10 DIAGNOSIS — M25671 Stiffness of right ankle, not elsewhere classified: Secondary | ICD-10-CM | POA: Diagnosis not present

## 2019-12-10 DIAGNOSIS — M25672 Stiffness of left ankle, not elsewhere classified: Secondary | ICD-10-CM | POA: Diagnosis not present

## 2019-12-10 DIAGNOSIS — M25571 Pain in right ankle and joints of right foot: Secondary | ICD-10-CM | POA: Diagnosis not present

## 2019-12-12 ENCOUNTER — Other Ambulatory Visit: Payer: Self-pay | Admitting: Adult Health

## 2019-12-14 NOTE — Telephone Encounter (Signed)
Okay for refill?  

## 2019-12-20 ENCOUNTER — Other Ambulatory Visit: Payer: Self-pay | Admitting: Adult Health

## 2019-12-22 ENCOUNTER — Other Ambulatory Visit: Payer: Self-pay

## 2019-12-22 ENCOUNTER — Telehealth: Payer: Self-pay | Admitting: Adult Health

## 2019-12-22 NOTE — Telephone Encounter (Signed)
DENIED.  FILLED ON 12/15/2019 FOR 3 MONTHS.  REQUEST IS TOO EARLY.

## 2019-12-22 NOTE — Telephone Encounter (Signed)
Confirmed with the pharmacy that there is another refill on file.  Nothing further needed.

## 2019-12-22 NOTE — Progress Notes (Signed)
Patient scheduled a call back. Busy at work, possibly exposure to [employer] from employee.    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

## 2019-12-22 NOTE — Telephone Encounter (Signed)
Medication Refill: Citalopram  Pharmacy: [place] FAX: [phone]

## 2019-12-23 ENCOUNTER — Encounter: Payer: PPO | Admitting: Adult Health

## 2019-12-23 DIAGNOSIS — Z20822 Contact with and (suspected) exposure to covid-19: Secondary | ICD-10-CM | POA: Diagnosis not present

## 2019-12-23 DIAGNOSIS — Z03818 Encounter for observation for suspected exposure to other biological agents ruled out: Secondary | ICD-10-CM | POA: Diagnosis not present

## 2019-12-24 ENCOUNTER — Telehealth: Payer: Self-pay | Admitting: Family Medicine

## 2019-12-24 NOTE — Telephone Encounter (Signed)
A PA was submitted to CoverMyMeds.  Waiting on a determination.  Key: [id number] - PA Case ID: [id number] - Rx #: [id number]

## 2019-12-24 NOTE — Telephone Encounter (Signed)
Medication has been approved.  Left a message on identified voicemail informing the pt that she can pick up her prescription from the pharmacy.  Nothing further needed.

## 2019-12-28 DIAGNOSIS — H04332 Acute lacrimal canaliculitis of left lacrimal passage: Secondary | ICD-10-CM | POA: Diagnosis not present

## 2019-12-28 DIAGNOSIS — Z79899 Other long term (current) drug therapy: Secondary | ICD-10-CM | POA: Diagnosis not present

## 2020-01-13 ENCOUNTER — Telehealth: Payer: Self-pay | Admitting: Allergy and Immunology

## 2020-01-13 MED ORDER — ALBUTEROL SULFATE HFA 108 (90 BASE) MCG/ACT IN AERS: 1.0000 | INHALATION_SPRAY | RESPIRATORY_TRACT | 1 refills | Status: DC | PRN

## 2020-01-13 NOTE — Telephone Encounter (Signed)
Patient is in need of a refill for her rescue inhaler she states she is having a hard time breathing

## 2020-01-13 NOTE — Telephone Encounter (Signed)
Called and spoke with the patient and she stated that her breathing started giving her issues last night where it feels like she is having a hard time taking a deep breath. She noticed that her Albuterol inhaler was out of date. Confirmed the pharmacy to send her inhaler to and asked the patient to call back if her breathing does not improve. Patient verbalized understanding.

## 2020-01-24 ENCOUNTER — Telehealth: Payer: Self-pay | Admitting: Adult Health

## 2020-01-24 NOTE — Telephone Encounter (Signed)
  hydrochlorothiazide (HYDRODIURIL) 25 MG tablet     [id number] - [place], [place] - [address] AT [facility] [address] Phone:  [phone]  Fax:  [phone]

## 2020-01-25 ENCOUNTER — Encounter: Payer: Self-pay | Admitting: Allergy and Immunology

## 2020-01-25 ENCOUNTER — Other Ambulatory Visit: Payer: Self-pay

## 2020-01-25 ENCOUNTER — Ambulatory Visit: Payer: PPO | Admitting: Allergy and Immunology

## 2020-01-25 VITALS — BP 116/64 | HR 62 | Temp 97.4°F | Resp 16 | Ht 63.0 in | Wt 197.8 lb

## 2020-01-25 DIAGNOSIS — J4540 Moderate persistent asthma, uncomplicated: Secondary | ICD-10-CM | POA: Diagnosis not present

## 2020-01-25 DIAGNOSIS — J3089 Other allergic rhinitis: Secondary | ICD-10-CM

## 2020-01-25 DIAGNOSIS — K219 Gastro-esophageal reflux disease without esophagitis: Secondary | ICD-10-CM

## 2020-01-25 MED ORDER — HYDROCHLOROTHIAZIDE 25 MG PO TABS: 25.0000 mg | ORAL_TABLET | Freq: Every day | ORAL | 0 refills | Status: DC

## 2020-01-25 MED ORDER — FAMOTIDINE 20 MG PO TABS: 20.0000 mg | ORAL_TABLET | Freq: Every day | ORAL | 5 refills | Status: DC

## 2020-01-25 MED ORDER — TRELEGY ELLIPTA 200-62.5-25 MCG/INH IN AEPB: 1.0000 | INHALATION_SPRAY | Freq: Every day | RESPIRATORY_TRACT | 5 refills | Status: DC

## 2020-01-25 NOTE — Patient Instructions (Addendum)
    1. Continue to Treat inflammation:   A. Trelegy 200 - one inhalation daily (replaces Arnuity)  B. OTC Rhinocort one spray each nostril 3-7 times a week  2. Continue to Treat reflux:   A. Famotidine 20 mg - one time per day  3. If needed:   A. nasal saline wash  B. ProAir HFA 2 puffs every 4-6 hours  C. OTC antihistamine - Claritin/Allegra/Zyrtec  D. OTC Mucinex DM 2 tablets twice a day  4. Return to clinic in 4 weeks or earlier if problem

## 2020-01-25 NOTE — Addendum Note (Signed)
Addended by: Miles Costain T on: 01/25/2020 07:41 AM   Modules accepted: Orders

## 2020-01-25 NOTE — Progress Notes (Signed)
[place]   Follow-up Note  Referring Provider: [name], NP Primary Provider: [name], NP Date of Office Visit: 01/25/2020  Subjective:   Amber Brewer (DOB: 1951/09/16) is a 69 y.o. female who returns to the [facility] on 01/25/2020 in re-evaluation of the following:  HPI: Allysen returns to this clinic in evaluation of asthma and allergic rhinitis and LPR.  Her last visit to this clinic was 25 May 2019.   She believes that she still has some air hunger even while using her Arnuity on a regular basis.  She does not use a short acting bronchodilator.  She can exercise without much difficulty as she is undergoing a 15-minute exercise program about every other day.  She has not required a systemic steroid to treat an exacerbation of asthma.  Her nose is really doing quite well at this point in time and she has not required an antibiotic to treat an episode of sinusitis.  Her reflux is under excellent control at this point in time and she is had no problems with her throat.  She has obtained two [place] vaccinations.  Allergies as of 01/25/2020      Reactions   Codeine Phosphate Nausea And Vomiting   REACTION: unspecified   Prednisone Shortness Of Breath   Betadine [povidone Iodine] Hives   After surgical skin prep   Sulfa Antibiotics Nausea And Vomiting   Iodine Rash   Broke out on skin once years ago--**Betadine Skin Prep**      Medication List      albuterol 108 (90 Base) MCG/ACT inhaler Commonly known as: ProAir HFA Inhale 1-2 puffs into the lungs every 4 (four) hours as needed.   Arnuity Ellipta 200 MCG/ACT Aepb Generic drug: Fluticasone Furoate INHALE 1 PUFF BY MOUTH DAILY   carvedilol 12.5 MG tablet Commonly known as: COREG Take 1 tablet (12.5 mg total) by mouth 2 (two) times daily with a meal.   cholecalciferol 1000 units tablet Commonly known as: VITAMIN D Take 2,000  Units by mouth daily.   citalopram 10 MG tablet Commonly known as: CELEXA Take 1 tablet (10 mg total) by mouth daily.   cycloSPORINE 0.05 % ophthalmic emulsion Commonly known as: RESTASIS Place 1 drop into both eyes 2 (two) times daily.   Dotti 0.05 MG/24HR patch Generic drug: estradiol APPLY 1 PATCH TOPICALLY ONTO THE SKIN 2 TIMES A WEEK   famotidine 40 MG tablet Commonly known as: PEPCID Take 1 tablet (40 mg total) by mouth daily. What changed: how much to take   Fish Oil 1000 MG Caps Take 1 capsule by mouth daily.   Flax Seed Oil 1000 MG Caps Take 1 capsule by mouth daily.   gabapentin 100 MG capsule Commonly known as: NEURONTIN Take 1 capsule (100 mg total) by mouth at bedtime.   hydrochlorothiazide 25 MG tablet Commonly known as: HYDRODIURIL Take 1 tablet (25 mg total) by mouth daily.   irbesartan 150 MG tablet Commonly known as: AVAPRO Take 2 tablets (300 mg total) by mouth daily.   levothyroxine 50 MCG tablet Commonly known as: SYNTHROID Take 1 tablet (50 mcg total) by mouth daily before breakfast.   meloxicam 15 MG tablet Commonly known as: MOBIC Take 1 tablet (15 mg total) by mouth daily.   potassium chloride 10 MEQ tablet Commonly known as: KLOR-CON Take 1 tablet by mouth once daily   Rhinocort Allergy 32 MCG/ACT nasal spray Generic drug: budesonide  Place 1 spray into both nostrils daily as needed for allergies.   TURMERIC PO Take by mouth.   Zinc 100 MG Tabs Take 1 tablet by mouth daily.       Past Medical History:  Diagnosis Date  . Asthma   . Chicken pox   . DDD (degenerative disc disease), cervical   . Depression   . Gallstones   . GERD (gastroesophageal reflux disease)   . Hx of melanoma in situ 2002  . Hx: UTI (urinary tract infection)   . Hyperlipidemia   . Hypertension   . Hypothyroidism   . IBS (irritable bowel syndrome)   . Melanoma ([place])    x2   . Nephrolithiasis   . Osteoarthritis   . PVC's (premature ventricular  contractions)   . Status post dilation of esophageal narrowing     Past Surgical History:  Procedure Laterality Date  . ABDOMINAL HYSTERECTOMY  1974  . APPENDECTOMY  1977  . BLADDER SURGERY  1974  . BREAST EXCISIONAL BIOPSY    . CATARACT EXTRACTION Bilateral 2015  . CHOLECYSTECTOMY  1987  . removal of melanoma in situ      Review of systems negative except as noted in HPI / PMHx or noted below:  Review of Systems  Constitutional: Negative.   HENT: Negative.   Eyes: Negative.   Respiratory: Negative.   Cardiovascular: Negative.   Gastrointestinal: Negative.   Genitourinary: Negative.   Musculoskeletal: Negative.   Skin: Negative.   Neurological: Negative.   Endo/Heme/Allergies: Negative.   Psychiatric/Behavioral: Negative.      Objective:   Vitals:   01/25/20 1124  BP: 116/64  Pulse: 62  Resp: 16  Temp: (!) 97.4 F (36.3 C)  SpO2: 97%   Height: 5\' 3"  (160 cm)  Weight: 197 lb 12.8 oz (89.7 kg)   Physical Exam Constitutional:      Appearance: She is not diaphoretic.  HENT:     Head: Normocephalic.     Right Ear: Tympanic membrane, ear canal and external ear normal.     Left Ear: Tympanic membrane, ear canal and external ear normal.     Nose: Nose normal. No mucosal edema or rhinorrhea.     Mouth/Throat:     Pharynx: Uvula midline. No oropharyngeal exudate.  Eyes:     Conjunctiva/sclera: Conjunctivae normal.  Neck:     Thyroid: No thyromegaly.     Trachea: Trachea normal. No tracheal tenderness or tracheal deviation.  Cardiovascular:     Rate and Rhythm: Normal rate and regular rhythm.     Heart sounds: Normal heart sounds, S1 normal and S2 normal. No murmur.  Pulmonary:     Effort: No respiratory distress.     Breath sounds: Normal breath sounds. No stridor. No wheezing or rales.  Lymphadenopathy:     Head:     Right side of head: No tonsillar adenopathy.     Left side of head: No tonsillar adenopathy.     Cervical: No cervical adenopathy.   Skin:    Findings: No erythema or rash.     Nails: There is no clubbing.  Neurological:     Mental Status: She is alert.     Diagnostics:    Spirometry was performed and demonstrated an FEV1 of 1.76 at 80 % of predicted.  Assessment and Plan:   1. Not well controlled moderate persistent asthma   2. Other allergic rhinitis   3. LPRD (laryngopharyngeal reflux disease)     1. Continue to Treat  inflammation:   A. Trelegy 200 - one inhalation daily (replaces Arnuity)  B. OTC Rhinocort one spray each nostril 3-7 times a week  2. Continue to Treat reflux:   A. Famotidine 20 mg - one time per day  3. If needed:   A. nasal saline wash  B. ProAir HFA 2 puffs every 4-6 hours  C. OTC antihistamine - Claritin/Allegra/Zyrtec  D. OTC Mucinex DM 2 tablets twice a day  4. Return to clinic in 4 weeks or earlier if problem  We will have Emerie use a sample of triple inhaler for the next 4 weeks and see what type of response we get regarding her remaining air hunger which appears to be her only respiratory tract symptoms that still lingers at this point in time while she continues to use anti-inflammatory agents for airway and therapy directed against reflux.  I will regroup with her in 4 weeks or earlier if there is a problem.  Allena Katz, MD Allergy / Immunology [place]

## 2020-01-25 NOTE — Telephone Encounter (Signed)
Sent to the pharmacy by e-scribe for 90 days.  Pt has upcoming cpx. 

## 2020-01-26 ENCOUNTER — Encounter: Payer: Self-pay | Admitting: Allergy and Immunology

## 2020-01-27 DIAGNOSIS — L570 Actinic keratosis: Secondary | ICD-10-CM | POA: Diagnosis not present

## 2020-01-27 DIAGNOSIS — L820 Inflamed seborrheic keratosis: Secondary | ICD-10-CM | POA: Diagnosis not present

## 2020-01-27 DIAGNOSIS — D485 Neoplasm of uncertain behavior of skin: Secondary | ICD-10-CM | POA: Diagnosis not present

## 2020-02-11 ENCOUNTER — Other Ambulatory Visit: Payer: Self-pay | Admitting: Family Medicine

## 2020-02-11 ENCOUNTER — Telehealth: Payer: Self-pay | Admitting: Adult Health

## 2020-02-11 MED ORDER — POTASSIUM CHLORIDE ER 10 MEQ PO TBCR: 10.0000 meq | EXTENDED_RELEASE_TABLET | Freq: Every day | ORAL | 0 refills | Status: DC

## 2020-02-11 NOTE — Telephone Encounter (Signed)
Sent to the pharmacy by e-scribe earlier today.

## 2020-02-11 NOTE — Telephone Encounter (Signed)
Potassium prescription needs to be refilled.  2 pills left.  [facility] [address]

## 2020-02-11 NOTE — Telephone Encounter (Signed)
Sent to the pharmacy by e-scribe. 

## 2020-02-17 ENCOUNTER — Other Ambulatory Visit: Payer: Self-pay | Admitting: Allergy and Immunology

## 2020-02-17 ENCOUNTER — Other Ambulatory Visit: Payer: Self-pay | Admitting: Adult Health

## 2020-02-17 NOTE — Telephone Encounter (Signed)
Sent to the pharmacy by e-scribe. Pt has upcoming cpx on 03/02/20

## 2020-02-22 ENCOUNTER — Telehealth: Payer: Self-pay | Admitting: Adult Health

## 2020-02-22 NOTE — Chronic Care Management (AMB) (Signed)
  Chronic Care Management   Outreach Note  02/22/2020 Name: Amber Brewer MRN: [id number] DOB: 07/02/1951  Referred by: [name], NP Reason for referral : Chronic Care Management   An unsuccessful telephone outreach was attempted today. The patient was referred to the pharmacist for assistance with care management and care coordination.   Follow Up Plan:   [place]

## 2020-02-22 NOTE — Chronic Care Management (AMB) (Signed)
  Chronic Care Management   Note  02/22/2020 Name: Amber Brewer MRN: [id number] DOB: 1951/03/03  Amber Brewer is a 69 y.o. year old female who is a primary care patient of [name], NP. I reached out to [place] by phone today in response to a referral sent by [name]'s PCP, [name], NP.   [name] was given information about Chronic Care Management services today including:  1. CCM service includes personalized support from designated clinical staff supervised by her physician, including individualized plan of care and coordination with other care providers 2. 24/7 contact phone numbers for assistance for urgent and routine care needs. 3. Service will only be billed when office clinical staff spend 20 minutes or more in a month to coordinate care. 4. Only one practitioner may furnish and bill the service in a calendar month. 5. The patient may stop CCM services at any time (effective at the end of the month) by phone call to the office staff.   Patient agreed to services and verbal consent obtained.   Follow up plan:   [name]

## 2020-03-01 ENCOUNTER — Other Ambulatory Visit: Payer: Self-pay

## 2020-03-02 ENCOUNTER — Telehealth: Payer: Self-pay | Admitting: Adult Health

## 2020-03-02 ENCOUNTER — Encounter: Payer: Self-pay | Admitting: Adult Health

## 2020-03-02 ENCOUNTER — Ambulatory Visit (INDEPENDENT_AMBULATORY_CARE_PROVIDER_SITE_OTHER): Payer: PPO | Admitting: Adult Health

## 2020-03-02 VITALS — BP 132/80 | Temp 97.3°F | Ht 64.0 in | Wt 198.0 lb

## 2020-03-02 DIAGNOSIS — I10 Essential (primary) hypertension: Secondary | ICD-10-CM

## 2020-03-02 DIAGNOSIS — R7303 Prediabetes: Secondary | ICD-10-CM

## 2020-03-02 DIAGNOSIS — F411 Generalized anxiety disorder: Secondary | ICD-10-CM

## 2020-03-02 DIAGNOSIS — Z0000 Encounter for general adult medical examination without abnormal findings: Secondary | ICD-10-CM | POA: Diagnosis not present

## 2020-03-02 DIAGNOSIS — E039 Hypothyroidism, unspecified: Secondary | ICD-10-CM

## 2020-03-02 DIAGNOSIS — E782 Mixed hyperlipidemia: Secondary | ICD-10-CM

## 2020-03-02 LAB — CBC WITH DIFFERENTIAL/PLATELET
Basophils Absolute: 0.1 10*3/uL (ref 0.0–0.1)
Basophils Relative: 1 % (ref 0.0–3.0)
Eosinophils Absolute: 0.2 10*3/uL (ref 0.0–0.7)
Eosinophils Relative: 3.1 % (ref 0.0–5.0)
HCT: 40.9 % (ref 36.0–46.0)
Hemoglobin: 13.6 g/dL (ref 12.0–15.0)
Lymphocytes Relative: 19.2 % (ref 12.0–46.0)
Lymphs Abs: 1.3 10*3/uL (ref 0.7–4.0)
MCHC: 33.3 g/dL (ref 30.0–36.0)
MCV: 90.9 fl (ref 78.0–100.0)
Monocytes Absolute: 1 10*3/uL (ref 0.1–1.0)
Monocytes Relative: 14 % — ABNORMAL HIGH (ref 3.0–12.0)
Neutro Abs: 4.3 10*3/uL (ref 1.4–7.7)
Neutrophils Relative %: 62.7 % (ref 43.0–77.0)
Platelets: 195 10*3/uL (ref 150.0–400.0)
RBC: 4.5 Mil/uL (ref 3.87–5.11)
RDW: 14.1 % (ref 11.5–15.5)
WBC: 6.8 10*3/uL (ref 4.0–10.5)

## 2020-03-02 LAB — COMPREHENSIVE METABOLIC PANEL
ALT: 17 U/L (ref 0–35)
AST: 18 U/L (ref 0–37)
Albumin: 4.3 g/dL (ref 3.5–5.2)
Alkaline Phosphatase: 80 U/L (ref 39–117)
BUN: 15 mg/dL (ref 6–23)
CO2: 30 mEq/L (ref 19–32)
Calcium: 9.7 mg/dL (ref 8.4–10.5)
Chloride: 100 mEq/L (ref 96–112)
Creatinine, Ser: 0.92 mg/dL (ref 0.40–1.20)
GFR: 60.5 mL/min (ref >60.00)
Glucose, Bld: 121 mg/dL — ABNORMAL HIGH (ref 70–99)
Potassium: 4.1 mEq/L (ref 3.5–5.1)
Sodium: 138 mEq/L (ref 135–145)
Total Bilirubin: 0.7 mg/dL (ref 0.2–1.2)
Total Protein: 6.8 g/dL (ref 6.0–8.3)

## 2020-03-02 LAB — LIPID PANEL
Cholesterol: 248 mg/dL — ABNORMAL HIGH (ref 0–200)
HDL: 61.3 mg/dL (ref >39.00)
LDL Cholesterol: 170 mg/dL — ABNORMAL HIGH (ref 0–99)
NonHDL: 186.4
Total CHOL/HDL Ratio: 4
Triglycerides: 84 mg/dL (ref 0.0–149.0)
VLDL: 16.8 mg/dL (ref 0.0–40.0)

## 2020-03-02 LAB — HEMOGLOBIN A1C: Hgb A1c MFr Bld: 6.5 % (ref 4.6–6.5)

## 2020-03-02 LAB — TSH: TSH: 4.73 u[IU]/mL — ABNORMAL HIGH (ref 0.35–4.50)

## 2020-03-02 MED ORDER — METFORMIN HCL 500 MG PO TABS
250.0000 mg | ORAL_TABLET | Freq: Every day | ORAL | 3 refills | Status: DC
Start: 2020-03-02 — End: 2020-04-18

## 2020-03-02 MED ORDER — ROSUVASTATIN CALCIUM 10 MG PO TABS
10.0000 mg | ORAL_TABLET | Freq: Every day | ORAL | 0 refills | Status: DC
Start: 2020-03-02 — End: 2020-04-18

## 2020-03-02 MED ORDER — CITALOPRAM HYDROBROMIDE 10 MG PO TABS: 10.0000 mg | ORAL_TABLET | Freq: Every day | ORAL | 1 refills | Status: DC

## 2020-03-02 NOTE — Progress Notes (Signed)
Subjective:    Patient ID: Amber Brewer, female    DOB: 1950/12/14, 69 y.o.   MRN: [id number]  HPI Patient presents for yearly preventative medicine examination. She is a pleasant 69 year old female who  has a past medical history of Asthma, Chicken pox, DDD (degenerative disc disease), cervical, Depression, Gallstones, GERD (gastroesophageal reflux disease), melanoma in situ (2002), UTI (urinary tract infection), Hyperlipidemia, Hypertension, Hypothyroidism, IBS (irritable bowel syndrome), Melanoma ([place]), Nephrolithiasis, Osteoarthritis, PVC's (premature ventricular contractions), and Status post dilation of esophageal narrowing.  Essential Hypertension - is managed by Cardiology -she is currently maintained with Coreg 12.5 mg twice daily, Avapro 300 mg daily, and hydrochlorothiazide 25 mg daily.  She denies dizziness, lightheadedness, chest pain, or shortness of breath. She does monitor her blood pressure at home and reports readings in the 130/80's.  BP Readings from Last 3 Encounters:  03/02/20 132/80  01/25/20 116/64  09/24/19 130/68   Hypothyroidism -has been well managed with Synthroid 50 mcg every morning  Seasonal Allergies and asthma -is managed by allergy and asthma.  She is currently prescribed Telergy Ellipta, albuterol inhaler as needed, and Rhinocort. She has a follow up appointment in June.   Hyperlipidemia- not currently prescribed statin medication .  Lab Results  Component Value Date   CHOL 218 (H) 01/15/2011   HDL 52.70 01/15/2011   LDLCALC 118 (H) 06/04/2007   LDLDIRECT 143.5 01/15/2011   TRIG 113.0 01/15/2011   CHOLHDL 4 01/15/2011   Anxiety -well controlled with Celexa 10 mg daily.  Glucose Intolerance -currently on medication.  Her last A1c Lab Results  Component Value Date   HGBA1C 6.1 12/08/2018    All immunizations and health maintenance protocols were reviewed with the patient and needed orders were placed.  Appropriate screening laboratory  values were ordered for the patient including screening of hyperlipidemia, renal function and hepatic function.  Medication reconciliation,  past medical history, social history, problem list and allergies were reviewed in detail with the patient  Goals were established with regard to weight loss, exercise, and  diet in compliance with medications. She has been eating healthy but has not been exercising. Her weight continues to go up.  Wt Readings from Last 10 Encounters:  03/02/20 198 lb (89.8 kg)  01/25/20 197 lb 12.8 oz (89.7 kg)  09/24/19 195 lb (88.5 kg)  08/23/19 189 lb (85.7 kg)  12/08/18 183 lb (83 kg)  06/03/18 173 lb (78.5 kg)  05/06/18 172 lb (78 kg)  02/17/18 174 lb (78.9 kg)  12/02/17 179 lb (81.2 kg)  06/17/17 179 lb 9.6 oz (81.5 kg)    She is up-to-date on routine colon cancer screening as well as mammograms.   Review of Systems  Constitutional: Negative.   HENT: Negative.   Eyes: Negative.   Respiratory: Negative.   Cardiovascular: Negative.   Gastrointestinal: Negative.   Endocrine: Negative.   Genitourinary: Negative.   Musculoskeletal: Positive for arthralgias.  Skin: Negative.   Allergic/Immunologic: Negative.   Neurological: Negative.   Hematological: Negative.   Psychiatric/Behavioral: Negative.    Past Medical History:  Diagnosis Date  . Asthma   . Chicken pox   . DDD (degenerative disc disease), cervical   . Depression   . Gallstones   . GERD (gastroesophageal reflux disease)   . Hx of melanoma in situ 2002  . Hx: UTI (urinary tract infection)   . Hyperlipidemia   . Hypertension   . Hypothyroidism   . IBS (irritable bowel syndrome)   .  Melanoma ([place])    x2   . Nephrolithiasis   . Osteoarthritis   . PVC's (premature ventricular contractions)   . Status post dilation of esophageal narrowing     Social History   Socioeconomic History  . Marital status: Married    Spouse name: Not on file  . Number of children: 2  . Years of  education: 77  . Highest education level: Not on file  Occupational History  . Occupation: licensed Engineer, site: OTHER    Comment: Office manager  Tobacco Use  . Smoking status: Never Smoker  . Smokeless tobacco: Never Used  Substance and Sexual Activity  . Alcohol use: No  . Drug use: No  . Sexual activity: Yes    Birth control/protection: Post-menopausal, Surgical  Other Topics Concern  . Not on file  Social History Narrative   epworth sleepiness scale = 8 (01/18/2016)   Lives at home with her husband and disabled son   Right handed   Caffeine: decaf   Social Determinants of Health   Financial Resource Strain:   . Difficulty of Paying Living Expenses:   Food Insecurity:   . Worried About Charity fundraiser in the Last Year:   . Arboriculturist in the Last Year:   Transportation Needs:   . Film/video editor (Medical):   Marland Kitchen Lack of Transportation (Non-Medical):   Physical Activity:   . Days of Exercise per Week:   . Minutes of Exercise per Session:   Stress:   . Feeling of Stress :   Social Connections:   . Frequency of Communication with Friends and Family:   . Frequency of Social Gatherings with Friends and Family:   . Attends Religious Services:   . Active Member of Clubs or Organizations:   . Attends Archivist Meetings:   Marland Kitchen Marital Status:   Intimate Partner Violence:   . Fear of Current or Ex-Partner:   . Emotionally Abused:   Marland Kitchen Physically Abused:   . Sexually Abused:     Past Surgical History:  Procedure Laterality Date  . ABDOMINAL HYSTERECTOMY  1974  . APPENDECTOMY  1977  . BLADDER SURGERY  1974  . BREAST EXCISIONAL BIOPSY    . CATARACT EXTRACTION Bilateral 2015  . CHOLECYSTECTOMY  1987  . removal of melanoma in situ      Family History  Problem Relation Age of Onset  . Hypertension Mother   . Thyroid disease Mother   . Arthritis Mother   . Depression Mother   . Hearing loss Mother   . Miscarriages /  [place] Mother   . Heart attack Father   . Heart disease Father   . Alcohol abuse Father   . Arthritis Father   . Depression Father   . Drug abuse Father   . Hypertension Father   . Alcohol abuse Sister   . Arthritis Sister   . Depression Sister   . Hearing loss Sister   . Hypotension Sister   . Hypertension Maternal Aunt   . Hypertension Maternal Uncle   . Clotting disorder Maternal Uncle   . Prostate cancer Maternal Uncle   . Colon cancer Maternal Uncle   . Colon polyps Maternal Uncle   . Diabetes Maternal Uncle        all uncles  . Arthritis Maternal Grandmother   . Asthma Maternal Grandmother   . Breast cancer Maternal Grandmother   . Stomach cancer Maternal Grandmother   .  Hearing loss Maternal Grandmother   . Diabetes Maternal Grandfather   . Heart disease Maternal Grandfather   . Arthritis Maternal Grandfather   . Hearing loss Maternal Grandfather   . Hypertension Maternal Grandfather   . Kidney disease Maternal Grandfather   . Hyperlipidemia Paternal Grandmother        also MI  . Breast cancer Paternal Grandmother   . Arthritis Paternal Grandmother   . Heart attack Paternal Grandmother   . Hyperlipidemia Paternal Grandfather        also MI  . Heart disease Paternal Grandfather   . Heart attack Paternal Grandfather   . Depression Sister   . Heart disease Sister   . Hyperlipidemia Sister   . Hypertension Son   . Endometriosis Daughter     Allergies  Allergen Reactions  . Codeine Phosphate Nausea And Vomiting    REACTION: unspecified  . Prednisone Shortness Of Breath  . Betadine [Povidone Iodine] Hives    After surgical skin prep  . Sulfa Antibiotics Nausea And Vomiting  . Iodine Rash    Broke out on skin once years ago--**Betadine Skin Prep**    Current Outpatient Medications on File Prior to Visit  Medication Sig Dispense Refill  . albuterol (PROAIR HFA) 108 (90 Base) MCG/ACT inhaler Inhale 1-2 puffs into the lungs every 4 (four) hours as  needed. 18 g 1  . budesonide (RHINOCORT ALLERGY) 32 MCG/ACT nasal spray Place 1 spray into both nostrils daily as needed for allergies.     . carvedilol (COREG) 12.5 MG tablet Take 1 tablet (12.5 mg total) by mouth 2 (two) times daily with a meal. 180 tablet 3  . cholecalciferol (VITAMIN D) 1000 UNITS tablet Take 2,000 Units by mouth daily.     . cycloSPORINE (RESTASIS) 0.05 % ophthalmic emulsion Place 1 drop into both eyes 2 (two) times daily.     . DOTTI 0.05 MG/24HR patch APPLY 1 PATCH TOPICALLY ONTO THE SKIN 2 TIMES A WEEK 24 patch 2  . famotidine (PEPCID) 20 MG tablet Take 1 tablet (20 mg total) by mouth daily. 30 tablet 5  . Flaxseed, Linseed, (FLAX SEED OIL) 1000 MG CAPS Take 1 capsule by mouth daily.     . Fluticasone-Umeclidin-Vilant (TRELEGY ELLIPTA) 200-62.5-25 MCG/INH AEPB Inhale 1 puff into the lungs daily. 60 each 5  . hydrochlorothiazide (HYDRODIURIL) 25 MG tablet Take 1 tablet (25 mg total) by mouth daily. 90 tablet 0  . irbesartan (AVAPRO) 150 MG tablet Take 2 tablets (300 mg total) by mouth daily. 180 tablet 3  . Omega-3 Fatty Acids (FISH OIL) 1000 MG CAPS Take 1 capsule by mouth daily.     . potassium chloride (KLOR-CON) 10 MEQ tablet Take 1 tablet (10 mEq total) by mouth daily. 90 tablet 0  . SYNTHROID 50 MCG tablet TAKE 1 TABLET(50 MCG) BY MOUTH DAILY BEFORE BREAKFAST 90 tablet 0  . TURMERIC PO Take by mouth.    . Zinc 100 MG TABS Take 1 tablet by mouth daily.     No current facility-administered medications on file prior to visit.    BP 132/80   Temp (!) 97.3 F (36.3 C)   Ht 5\' 4"  (1.626 m)   Wt 198 lb (89.8 kg)   BMI 33.99 kg/m       Objective:   Physical Exam Vitals and nursing note reviewed.  Constitutional:      General: She is not in acute distress.    Appearance: Normal appearance. She is well-developed. She is not  ill-appearing.  HENT:     Head: Normocephalic and atraumatic.     Right Ear: Tympanic membrane, ear canal and external ear normal. There  is no impacted cerumen.     Left Ear: Tympanic membrane, ear canal and external ear normal. There is no impacted cerumen.     Nose: Nose normal. No congestion or rhinorrhea.     Mouth/Throat:     Mouth: Mucous membranes are moist.     Pharynx: Oropharynx is clear. No oropharyngeal exudate or posterior oropharyngeal erythema.  Eyes:     General:        Right eye: No discharge.        Left eye: No discharge.     Extraocular Movements: Extraocular movements intact.     Conjunctiva/sclera: Conjunctivae normal.     Pupils: Pupils are equal, round, and reactive to light.  Neck:     Thyroid: No thyromegaly.     Vascular: No carotid bruit.     Trachea: No tracheal deviation.  Cardiovascular:     Rate and Rhythm: Normal rate and regular rhythm.     Pulses: Normal pulses.     Heart sounds: Normal heart sounds. No murmur. No friction rub. No gallop.   Pulmonary:     Effort: Pulmonary effort is normal. No respiratory distress.     Breath sounds: Normal breath sounds. No stridor. No wheezing, rhonchi or rales.  Chest:     Chest wall: No tenderness.  Abdominal:     General: Abdomen is flat. Bowel sounds are normal. There is no distension.     Palpations: Abdomen is soft. There is no mass.     Tenderness: There is no abdominal tenderness. There is no right CVA tenderness, left CVA tenderness, guarding or rebound.     Hernia: No hernia is present.  Musculoskeletal:        General: No swelling, tenderness, deformity or signs of injury. Normal range of motion.     Cervical back: Normal range of motion and neck supple.     Right lower leg: No edema.     Left lower leg: No edema.  Lymphadenopathy:     Cervical: No cervical adenopathy.  Skin:    General: Skin is warm and dry.     Coloration: Skin is not jaundiced or pale.     Findings: No bruising, erythema, lesion or rash.  Neurological:     General: No focal deficit present.     Mental Status: She is alert and oriented to person, place, and  time.     Cranial Nerves: No cranial nerve deficit.     Sensory: No sensory deficit.     Motor: No weakness.     Coordination: Coordination normal.     Gait: Gait normal.     Deep Tendon Reflexes: Reflexes normal.  Psychiatric:        Mood and Affect: Mood normal.        Behavior: Behavior normal.        Thought Content: Thought content normal.        Judgment: Judgment normal.       Assessment & Plan:  1. Routine general medical examination at a health care facility - Needs to work on weight loss through diet and exercise - Follow up in one year or sooner if needed - CBC with Differential/Platelet - Comprehensive metabolic panel - Hemoglobin A1c - Lipid panel - TSH  2. Essential hypertension - No change in medications  - CBC with  Differential/Platelet - Comprehensive metabolic panel - Hemoglobin A1c - Lipid panel - TSH  3. Mixed hyperlipidemia - Likely need to start statin.  - CBC with Differential/Platelet - Comprehensive metabolic panel - Hemoglobin A1c - Lipid panel - TSH  4. Hypothyroidism, unspecified type - Consider change in synthroid  - CBC with Differential/Platelet - Comprehensive metabolic panel - Hemoglobin A1c - Lipid panel - TSH  5. Pre-diabetes - Consider metformin  - CBC with Differential/Platelet - Comprehensive metabolic panel - Hemoglobin A1c - Lipid panel - TSH  6. Generalized anxiety disorder - well controlled with Celexa  - CBC with Differential/Platelet - Comprehensive metabolic panel - Hemoglobin A1c - Lipid panel - TSH    [name], NP

## 2020-03-02 NOTE — Telephone Encounter (Signed)
Updated patient on her labs.  Her LDL and total cholesterol have increased over the last year and her A1c is now at 6.5.  We will start her on Crestor 10 mg and Metformin 250 mg daily.  She will follow-up in 3 months for recheck

## 2020-03-02 NOTE — Patient Instructions (Addendum)
It was great seeing you today   We will follow up with you regarding your blood work   Please work on weight loss with more aerobic exercise

## 2020-03-06 ENCOUNTER — Telehealth: Payer: Self-pay | Admitting: Adult Health

## 2020-03-06 NOTE — Telephone Encounter (Signed)
Pt was recently diagnosed a diabetic and would like a Freestyle Libre monitor sent to: [facility] [place].

## 2020-03-07 MED ORDER — FREESTYLE LIBRE 14 DAY READER DEVI: 1.0000 | 3 refills | Status: DC

## 2020-03-07 MED ORDER — FREESTYLE LIBRE 14 DAY SENSOR MISC: 1.0000 | 3 refills | Status: DC

## 2020-03-07 NOTE — Telephone Encounter (Signed)
Sent to the pharmacy by e-scribe. 

## 2020-03-13 ENCOUNTER — Telehealth: Payer: Self-pay | Admitting: Adult Health

## 2020-03-13 NOTE — Telephone Encounter (Signed)
Pt is unable to make her appt on June 16th. She would like to r/s to a Friday due to work   Pt can be reached at [phone]

## 2020-03-28 ENCOUNTER — Other Ambulatory Visit: Payer: Self-pay

## 2020-03-28 ENCOUNTER — Ambulatory Visit: Payer: PPO | Admitting: Allergy and Immunology

## 2020-03-28 ENCOUNTER — Encounter: Payer: Self-pay | Admitting: Allergy and Immunology

## 2020-03-28 VITALS — BP 122/64 | HR 68 | Resp 18 | Ht 64.0 in

## 2020-03-28 DIAGNOSIS — J3089 Other allergic rhinitis: Secondary | ICD-10-CM

## 2020-03-28 DIAGNOSIS — J4540 Moderate persistent asthma, uncomplicated: Secondary | ICD-10-CM

## 2020-03-28 DIAGNOSIS — K219 Gastro-esophageal reflux disease without esophagitis: Secondary | ICD-10-CM | POA: Diagnosis not present

## 2020-03-28 NOTE — Progress Notes (Signed)
[facility]   Follow-up Note  Referring Provider: [name], NP Primary Provider: [name], NP Date of Office Visit: 03/28/2020  Subjective:   Amber Brewer (DOB: October 04, 1951) is a 69 y.o. female who returns to the [facility] on 03/28/2020 in re-evaluation of the following:  HPI: Amber Brewer returns to this clinic in evaluation of asthma and allergic rhinitis and LPR.  Her last visit to this clinic was 25 January 2020.  During her last visit she was having issues with air hunger while using her Arnuity and we change her to a combination inhaler and she has done much better regarding that issue.  In fact, all of her air hunger has resolved.  As well, her cough has decreased dramatically although she still has a little bit of a cough throughout the day.  This does not appear to disturb her sleep.  She does not need to use a short acting bronchodilator and can exert herself without any problem.  She has had no problems with her nose.  She has had no problems with reflux.  She was recently diagnosed with diabetes and treated with Metformin and lost 10 pounds of weight and has eliminated all sugary forms of foods and overall feels much better as a result of this medication and lifestyle manipulation.  Her target weight is about 130 pounds.  Allergies as of 03/28/2020      Reactions   Codeine Phosphate Nausea And Vomiting   REACTION: unspecified   Prednisone Shortness Of Breath   Betadine [povidone Iodine] Hives   After surgical skin prep   Sulfa Antibiotics Nausea And Vomiting   Iodine Rash   Broke out on skin once years ago--**Betadine Skin Prep**      Medication List      albuterol 108 (90 Base) MCG/ACT inhaler Commonly known as: ProAir HFA Inhale 1-2 puffs into the lungs every 4 (four) hours as needed.   carvedilol 12.5 MG tablet Commonly known as: COREG Take 1 tablet (12.5 mg total) by mouth 2 (two)  times daily with a meal.   cholecalciferol 1000 units tablet Commonly known as: VITAMIN D Take 2,000 Units by mouth daily.   citalopram 10 MG tablet Commonly known as: CELEXA Take 1 tablet (10 mg total) by mouth daily.   cycloSPORINE 0.05 % ophthalmic emulsion Commonly known as: RESTASIS Place 1 drop into both eyes 2 (two) times daily.   Dotti 0.05 MG/24HR patch Generic drug: estradiol APPLY 1 PATCH TOPICALLY ONTO THE SKIN 2 TIMES A WEEK   famotidine 20 MG tablet Commonly known as: PEPCID Take 1 tablet (20 mg total) by mouth daily.   Fish Oil 1000 MG Caps Take 1 capsule by mouth daily.   Flax Seed Oil 1000 MG Caps Take 1 capsule by mouth daily.   FreeStyle Libre 14 Day Reader Kerrin Mo 1 Device by Does not apply route every 14 (fourteen) days.   FreeStyle Libre 14 Day Sensor Misc 1 Device by Does not apply route every 14 (fourteen) days.   hydrochlorothiazide 25 MG tablet Commonly known as: HYDRODIURIL Take 1 tablet (25 mg total) by mouth daily.   irbesartan 150 MG tablet Commonly known as: AVAPRO Take 2 tablets (300 mg total) by mouth daily.   metFORMIN 500 MG tablet Commonly known as: GLUCOPHAGE Take 0.5 tablets (250 mg total) by mouth daily with breakfast.   potassium chloride 10 MEQ tablet Commonly known as: KLOR-CON Take 1 tablet (10  mEq total) by mouth daily.   Rhinocort Allergy 32 MCG/ACT nasal spray Generic drug: budesonide Place 1 spray into both nostrils daily as needed for allergies.   rosuvastatin 10 MG tablet Commonly known as: Crestor Take 1 tablet (10 mg total) by mouth daily.   Synthroid 50 MCG tablet Generic drug: levothyroxine TAKE 1 TABLET(50 MCG) BY MOUTH DAILY BEFORE BREAKFAST   Trelegy Ellipta 200-62.5-25 MCG/INH Aepb Generic drug: Fluticasone-Umeclidin-Vilant Inhale 1 puff into the lungs daily.   TURMERIC PO Take by mouth.   Zinc 100 MG Tabs Take 1 tablet by mouth daily.       Past Medical History:  Diagnosis Date  .  Asthma   . Chicken pox   . DDD (degenerative disc disease), cervical   . Depression   . Gallstones   . GERD (gastroesophageal reflux disease)   . Hx of melanoma in situ 2002  . Hx: UTI (urinary tract infection)   . Hyperlipidemia   . Hypertension   . Hypothyroidism   . IBS (irritable bowel syndrome)   . Melanoma ([place])    x2   . Nephrolithiasis   . Osteoarthritis   . PVC's (premature ventricular contractions)   . Status post dilation of esophageal narrowing     Past Surgical History:  Procedure Laterality Date  . ABDOMINAL HYSTERECTOMY  1974  . APPENDECTOMY  1977  . BLADDER SURGERY  1974  . BREAST EXCISIONAL BIOPSY    . CATARACT EXTRACTION Bilateral 2015  . CHOLECYSTECTOMY  1987  . removal of melanoma in situ      Review of systems negative except as noted in HPI / PMHx or noted below:  Review of Systems  Constitutional: Negative.   HENT: Negative.   Eyes: Negative.   Respiratory: Negative.   Cardiovascular: Negative.   Gastrointestinal: Negative.   Genitourinary: Negative.   Musculoskeletal: Negative.   Skin: Negative.   Neurological: Negative.   Endo/Heme/Allergies: Negative.   Psychiatric/Behavioral: Negative.      Objective:   Vitals:   03/28/20 1211  BP: 122/64  Pulse: 68  Resp: 18  SpO2: 96%   Height: 5\' 4"  (162.6 cm)      Physical Exam Constitutional:      Appearance: She is not diaphoretic.  HENT:     Head: Normocephalic.     Right Ear: Tympanic membrane, ear canal and external ear normal.     Left Ear: Tympanic membrane, ear canal and external ear normal.     Nose: Nose normal. No mucosal edema or rhinorrhea.     Mouth/Throat:     Pharynx: Uvula midline. No oropharyngeal exudate.  Eyes:     Conjunctiva/sclera: Conjunctivae normal.  Neck:     Thyroid: No thyromegaly.     Trachea: Trachea normal. No tracheal tenderness or tracheal deviation.  Cardiovascular:     Rate and Rhythm: Normal rate and regular rhythm.     Heart sounds:  Normal heart sounds, S1 normal and S2 normal. No murmur.  Pulmonary:     Effort: No respiratory distress.     Breath sounds: Normal breath sounds. No stridor. No wheezing or rales.  Lymphadenopathy:     Head:     Right side of head: No tonsillar adenopathy.     Left side of head: No tonsillar adenopathy.     Cervical: No cervical adenopathy.  Skin:    Findings: No erythema or rash.     Nails: There is no clubbing.  Neurological:     Mental Status: She  is alert.     Diagnostics:    Spirometry was performed and demonstrated an FEV1 of 1.73 at 79 % of predicted.  The patient had an Asthma Control Test with the following results: ACT Total Score: 25.    Assessment and Plan:   1. Asthma, moderate persistent, well-controlled   2. Other allergic rhinitis   3. LPRD (laryngopharyngeal reflux disease)      1. Continue to Treat inflammation:   A. Trelegy 200 - one inhalation daily   B. OTC Rhinocort one spray each nostril 3-7 times a week  2. Continue to Treat reflux:   A. Famotidine 20 mg - one time per day  3. If needed:   A. nasal saline wash  B. ProAir HFA 2 puffs every 4-6 hours  C. OTC antihistamine - Claritin/Allegra/Zyrtec  D. OTC Mucinex DM 2 tablets twice a day  4. Return to clinic in 12 weeks or earlier if problem  Eleshia appears to be doing quite well regarding her respiratory tract inflammatory state while utilizing a combination of anti-inflammatory medications for both her upper and lower airway and also continues to treat reflux.  Assuming she does well with this plan I will see her back in this clinic in 12 weeks or earlier if there is a problem.  Allena Katz, MD Allergy / Immunology [place]

## 2020-03-28 NOTE — Patient Instructions (Signed)
    1. Continue to Treat inflammation:   A. Trelegy 200 - one inhalation daily   B. OTC Rhinocort one spray each nostril 3-7 times a week  2. Continue to Treat reflux:   A. Famotidine 20 mg - one time per day  3. If needed:   A. nasal saline wash  B. ProAir HFA 2 puffs every 4-6 hours  C. OTC antihistamine - Claritin/Allegra/Zyrtec  D. OTC Mucinex DM 2 tablets twice a day  4. Return to clinic in 12 weeks or earlier if problem

## 2020-03-29 ENCOUNTER — Encounter: Payer: Self-pay | Admitting: Allergy and Immunology

## 2020-04-03 NOTE — Chronic Care Management (AMB) (Signed)
Chronic Care Management Pharmacy  Name: Amber Brewer  MRN: [id number] DOB: [birth year]    Chief Complaint/ HPI  Amber Brewer,  69 y.o. , female presents for their Initial CCM visit with the clinical pharmacist In office. 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 They have been married for 83 years and she lives with him and her son that is disabled and bedridden. They are both opticians and has businesses before in [place]. Her husband had recent brain surgery 3 years ago. She is the primary caregiver for her mother, son, and her husband. She suffered from anxiety from all the stress around her, but mentions that citalopram helped her a lot. She mentions that she is just taking half tablet of 10 mg and she feels great. Patient reports changing her diet and losing 10 lbs in the last month. Patient is adamant that she doesn't want to take the statin and that was prescribed to her that is why she changed her eating habits and exercise routine. She mentions eating more fruits and vegetables and fruits and no more fast food. Her mother likes to eat fast food, but since she is the primary caregiver for her and feed her, she also eats fast food and she mentions that is the cause of her cholesterol being high. She mentions not eating fast food ever again.   PCP : [name], NP  Their chronic conditions include: HTN, HLD, pre-diabetes, asthma, hypothyroidism, anxiety  Office Visits: 03/02/20 OV - General medical exam. Advised to work on weight loss. Patient stable and no changes with medications.  Consult Visit: 01/25/20 OV (Kozlow, Allergy) - f/u visit. No exacerbation for the past months and can exercise moderately for 15 mins without much difficulty. Switched Ar[employer]ity to [employer].   Medications: Outpatient Encounter Medications as of 04/06/2020  Medication Sig  . albuterol  (PROAIR HFA) 108 (90 Base) MCG/ACT inhaler Inhale 1-2 puffs into the lungs every 4 (four) hours as needed.  . budesonide (RHINOCORT ALLERGY) 32 MCG/ACT nasal spray Place 1 spray into both nostrils daily as needed for allergies.   . carvedilol (COREG) 12.5 MG tablet Take 1 tablet (12.5 mg total) by mouth 2 (two) times daily with a meal.  . cholecalciferol (VITAMIN D) 1000 UNITS tablet Take 2,000 Units by mouth daily.   . citalopram (CELEXA) 10 MG tablet Take 1 tablet (10 mg total) by mouth daily. (Patient taking differently: Take 5 mg by mouth daily. Takes 0.5 tablet of 10 mg)  . Continuous Blood Gluc Receiver (FREESTYLE LIBRE 14 DAY READER) DEVI 1 Device by Does not apply route every 14 (fourteen) days.  . Continuous Blood Gluc Sensor (FREESTYLE LIBRE 14 DAY SENSOR) MISC 1 Device by Does not apply route every 14 (fourteen) days.  . cycloSPORINE (RESTASIS) 0.05 % ophthalmic emulsion Place 1 drop into both eyes 2 (two) times daily.   . DOTTI 0.05 MG/24HR patch APPLY 1 PATCH TOPICALLY ONTO THE SKIN 2 TIMES A WEEK (Patient taking differently: 0.5 patches. Uses 0.5 patch twice a week)  . famotidine (PEPCID) 20 MG tablet Take 1 tablet (20 mg total) by mouth daily.  . Fluticasone-Umeclidin-Vilant (TRELEGY ELLIPTA) 200-62.5-25 MCG/INH AEPB Inhale 1 puff into the lungs daily.  . hydrochlorothiazide (HYDRODIURIL) 25 MG tablet Take 1 tablet (25 mg total) by mouth daily.  . metFORMIN (GLUCOPHAGE) 500 MG tablet Take 0.5 tablets (250 mg total) by  mouth daily with breakfast.  . Omega-3 Fatty Acids (FISH OIL) 1000 MG CAPS Take 1 capsule by mouth daily.   . potassium chloride (KLOR-CON) 10 MEQ tablet Take 1 tablet (10 mEq total) by mouth daily.  Marland Kitchen SYNTHROID 50 MCG tablet TAKE 1 TABLET(50 MCG) BY MOUTH DAILY BEFORE BREAKFAST (Patient taking differently: 25 mcg. Takes 0.5 tablet of 50 mcg)  . TURMERIC PO Take by mouth.  . Flaxseed, Linseed, (FLAX SEED OIL) 1000 MG CAPS Take 1 capsule by mouth daily.  (Patient not  taking: Reported on 04/06/2020)  . irbesartan (AVAPRO) 150 MG tablet Take 2 tablets (300 mg total) by mouth daily. (Patient taking differently: Take 150 mg by mouth in the morning and at bedtime. )  . rosuvastatin (CRESTOR) 10 MG tablet Take 1 tablet (10 mg total) by mouth daily. (Patient not taking: Reported on 04/06/2020)  . Zinc 100 MG TABS Take 1 tablet by mouth daily. (Patient not taking: Reported on 04/06/2020)   No facility-administered encounter medications on file as of 04/06/2020.     Physical Activity: Insufficiently Active  . Days of Exercise per Week: 3 days  . Minutes of Exercise per Session: 30 min     Transportation Needs: No Transportation Needs  . Lack of Transportation (Medical): No  . Lack of Transportation (Non-Medical): No     Stress: Stress Concern Present  . Feeling of Stress : To some extent    Current Diagnosis/Assessment:  Goals Addressed            This Visit's Progress   . Chronic Care Management       CARE PLAN ENTRY  Current Barriers:  . Chronic Disease Management support, education, and care coordination needs related to Hypertension, Hyperlipidemia, and Diabetes   Hypertension BP Readings from Last 3 Encounters:  03/28/20 122/64  03/02/20 132/80  01/25/20 116/64   . Pharmacist Clinical Goal(s): o Over the next 90 days, patient will work with PharmD and providers to maintain BP goal <140/90 . Current regimen:  . Carvedilol 12.5 mg 1 tablet twice daily with a meal . HCTZ 25 mg 1 tablet daily at lunch . Irbesartan 150 mg 1 tablet in the morning and at night . Interventions: o Discussed low salt diet and exercising as tolerated extensively o Discussed the benefits of weight loss . Patient self care activities - Over the next 90 days, patient will: o Check BP once a week, document, and provide at future appointments o Ensure daily salt intake < 2300 mg/day o Continue lifting weights and yoga in moderation 3 times a week for 30  mins  Hyperlipidemia Lab Results  Component Value Date/Time   LDLCALC 170 (H) 03/02/2020 07:32 AM   LDLDIRECT 143.5 01/15/2011 08:24 AM   . Pharmacist Clinical Goal(s): o Over the next 90 days, patient will work with PharmD and providers to achieve LDL goal < 100 . Current regimen:  o Rosuvastatin 10 mg 1 tablet daily . Interventions: o Discussed low cholesterol diet and exercising as tolerated extensively o Counseled patient on the risk vs benefit on statin . Patient self care activities - Over the next 90 days, patient will: o Continue with lifestyle modifications o Avoid fast food o Continue yoga and lifting light weights  Diabetes Lab Results  Component Value Date/Time   HGBA1C 6.5 03/02/2020 07:32 AM   HGBA1C 6.1 12/08/2018 09:30 AM   . Pharmacist Clinical Goal(s): o Over the next 90 days, patient will work with PharmD and providers to achieve A1c  goal <6.5% . Current regimen:  o Metformin 500 mg 0.5 tablet daily with breakfast . Interventions: o Discussed carbohydrate counting and exercising as tolerated extensively . Patient self care activities - Over the next 90 days, patient will: o Check blood sugar once daily, document, and provide at future appointments o Contact provider with any episodes of hypoglycemia o Continue to lose weight at least 10 lbs a month  o Continue lifting weights and yoga  Medication management . Pharmacist Clinical Goal(s): o Over the next 90 days, patient will work with PharmD and providers to achieve optimal medication adherence . Current pharmacy: [facility] . Interventions o Comprehensive medication review performed. o Utilize [facility] pharmacy for medication synchronization, packaging and delivery . Patient self care activities - Over the next 90 days, patient will: o Focus on medication adherence by medication synchronization and delivery o Take medications as prescribed o Report any questions or concerns to PharmD and/or  provider(s)  Initial goal documentation        Hypertension    Office blood pressures are  BP Readings from Last 3 Encounters:  03/28/20 122/64  03/02/20 132/80  01/25/20 116/64    Patient has failed these meds in the past: bystolic Patient is currently controlled on the following medications:  . Carvedilol 12.5 mg 1 tablet twice daily with a meal . HCTZ 25 mg 1 tablet daily at lunch . Irbesartan 150 mg 1 tablet in the morning and at night  Patient checks BP at home weekly  BP stable < 140/90. Patient recently changed diet and exercise habits. Patient also lost 10 lbs in the past month. Patient denies eating fast food.   Plan  Continue current medications and control with diet and exercise     Hyperlipidemia   Lipid Panel     Component Value Date/Time   CHOL 248 (H) 03/02/2020 0732   TRIG 84.0 03/02/2020 0732   HDL 61.30 03/02/2020 0732   CHOLHDL 4 03/02/2020 0732   VLDL 16.8 03/02/2020 0732   LDLCALC 170 (H) 03/02/2020 0732   LDLDIRECT 143.5 01/15/2011 0824     The 10-year ASCVD risk score Mikey Bussing DC Jr., et al., 2013) is: 10.9%   Values used to calculate the score:     Age: 3 years     Sex: Female     Is Non-Hispanic African American: No     Diabetic: No     Tobacco smoker: No     Systolic Blood Pressure: 244 mmHg     Is BP treated: Yes     HDL Cholesterol: 61.3 mg/dL     Total Cholesterol: 248 mg/dL   Patient has failed these meds in past: none Patient is currently uncontrolled on the following medications:  . Rosuvastatin 10 mg 1 tablet daily  TC and LDL is high. Patient reports not taking rosuvastatin due to its side effect profile. Patient wants to try to see if she could lower the numbers down by just eating healthy and losing weight. Advised her that we will reevaluate on August to see if a statin is still warranted based on her lipid panel and A1c.    Plan  Continue current medications and control with diet and exercise   Hypothyroidism    Lab Results  Component Value Date/Time   TSH 4.73 (H) 03/02/2020 07:32 AM   TSH 7.22 (H) 12/08/2018 09:30 AM   TSH 6.15 (H) 05/06/2018 09:27 AM    Patient has failed these meds in past: npne Patient is currently  controlled on the following medications:  . Synthroid 50 mcg 0.5 tablet daily before breakfast  TSH went down from 7.22 to 4.73. Patient reports just taking half tablet of 50 mcg because she feels that there is no difference from taking the whole tablet. Reevaluate TSH levels at the next visit.  Plan  Continue current medications   Diabetes   Recent Relevant Labs: Lab Results  Component Value Date/Time   HGBA1C 6.5 03/02/2020 07:32 AM   HGBA1C 6.1 12/08/2018 09:30 AM   GFR 60.50 03/02/2020 07:32 AM   GFR 65.48 09/24/2019 10:04 AM     Checking BG: on CGM  Patient has failed these meds in past: None Patient is currently controlled on the following medications:  Marland Kitchen Metformin 500 mg 0.5 tablet daily with breakfast  Last diabetic Eye exam: No results found for: HMDIABEYEEXA  Last diabetic Foot exam: No results found for: HMDIABFOOTEX   A1c within goal < 7%. Patient lost 10 lbs in just a month and started changing her diet and exercise habits. Denies fast food. Patient reports lifting light weights and yoga at their workout room. She states having difficulty pushing herself to workout by herself, but she is trying. Denies diarrhea or upset stomach associated with metformin.  Plan  Continue current medications and control with diet and exercise    General Anxiety Disorder   Patient has failed these meds in past: diazepam Patient is currently controlled on the following medications:  . Citalopram 10 mg 0.5 tablet daily  Patient reports having better control on her anxiety. She reports that 5 mg is enough and does not want to up the dose to 10 mg if it is already working for her. She was in a lot of stress before taking care of her mother, husband, and son, but it  went a lot better since starting citalopram.  Plan  Continue current medications    Asthma   Patient has failed these meds in past: arnuity Patient is currently controlled on the following medications:  . Albuterol HFA 1-2 puffs into the lungs every 4 hrs as needed . Trelegy Ellipta 1 puff into the lungs daily  Follow by Dr. Neldon Mc (Allergy and Immunology). Patient feels better when she got switched from [place] to Trelegy. Has not used Albuterol for the past month. Patient reports tolerating moderate exercises at home.  Plan  Continue current medications and control with diet and exercise   OTCs/Health Maintenance   Patient is currently controlled on the following medications: Marland Kitchen Vitamin D 2000 units take 1 tablet daily . Freestyle Libre sensor and receiver . Restasis 0.05% eye drop 1 drop into both eyes twice daily . Dotti 0.05 mg/24 hr patch apply 10.5 patch onto the skin twice a week . Famotidine 20 mg 1 tablet daily . Fish oil 1000 mg 1 capsule daily . Turmeric 1 capsule once daily . Zinc 100 mg 1 tablet daily . Flaxseed 1000 mg 1 capsule daily . Budesonide 32 mcg/act nasa; spray 1 spray into both nostrils daily as needed  We discussed discontinuing taking her zinc and flax seed and and start on a multivitamin for adults. Patient reports cutting her Dotti to half patch twice a week since she denies having hot flashes at night even with just half patch.  Plan  STOP Flaxseed and zinc  START Multivitamin for adults once daily   Vaccines   Reviewed and discussed patient's vaccination history.    Immunization History  Administered Date(s) Administered  . Influenza Split  09/05/2015  . Influenza Whole 09/10/2007, 07/20/2009, 07/27/2011  . Influenza, High Dose Seasonal PF 07/24/2019  . Influenza,inj,Quad PF,6+ Mos 08/22/2018  . Pneumococcal Conjugate-13 12/10/2016  . Pneumococcal Polysaccharide-23 12/16/2017  . Td 10/21/2002  . Tdap 01/29/2011    Plan  Recommended patient receive shingrix vaccine in the office/pharmacy.    Medication Management   Pharmacy/Benefits: [facility] at [place] / [facility] Adherence: uses pill organizers  Pt endorses 100% compliance  Patient reports not pleased with the service of her current pharmacy. She is onboard with [facility] today to take care of medication synchronization and delivery.  Verbal consent obtained for [facility] enhanced pharmacy services (medication synchronization, adherence packaging, delivery coordination). A medication sync plan was created to allow patient to get all medications delivered once every 30 to 90 days per patient preference. Patient understands they have freedom to choose pharmacy and clinical pharmacist will coordinate care between all prescribers and [facility].  Plan  Utilize [facility] pharmacy for medication synchronization, packaging and delivery   Follow up: 3 month office visit   Geraldine Contras, PharmD Clinical Pharmacist [facility] [place] [phone]

## 2020-04-04 ENCOUNTER — Other Ambulatory Visit: Payer: Self-pay | Admitting: Adult Health

## 2020-04-04 DIAGNOSIS — I10 Essential (primary) hypertension: Secondary | ICD-10-CM

## 2020-04-04 DIAGNOSIS — E781 Pure hyperglyceridemia: Secondary | ICD-10-CM

## 2020-04-05 ENCOUNTER — Ambulatory Visit: Payer: PPO

## 2020-04-06 ENCOUNTER — Ambulatory Visit: Payer: PPO | Admitting: Pharmacist

## 2020-04-06 ENCOUNTER — Other Ambulatory Visit: Payer: Self-pay

## 2020-04-06 DIAGNOSIS — R7303 Prediabetes: Secondary | ICD-10-CM

## 2020-04-06 DIAGNOSIS — I10 Essential (primary) hypertension: Secondary | ICD-10-CM

## 2020-04-06 DIAGNOSIS — E785 Hyperlipidemia, unspecified: Secondary | ICD-10-CM

## 2020-04-06 NOTE — Patient Instructions (Addendum)
Visit Information  It was a pleasure meeting with you and knowing more about you this morning, [name]! I am excited to see your progress in August! Remember, baby steps. Make sure you enjoy what you're doing and not doing this as a burden. Eyes on the prize! I cannot wait to see and hear your success story! Please reach out anytime for anything. Below is a summary of what we talked about during the visit:  Goals Addressed            This Visit's Progress   . Chronic Care Management       CARE PLAN ENTRY  Current Barriers:  . Chronic Disease Management support, education, and care coordination needs related to Hypertension, Hyperlipidemia, and Diabetes   Hypertension BP Readings from Last 3 Encounters:  03/28/20 122/64  03/02/20 132/80  01/25/20 116/64   . Pharmacist Clinical Goal(s): o Over the next 90 days, patient will work with PharmD and providers to maintain BP goal <140/90 . Current regimen:  . Carvedilol 12.5 mg 1 tablet twice daily with a meal . HCTZ 25 mg 1 tablet daily at lunch . Irbesartan 150 mg 1 tablet in the morning and at night . Interventions: o Discussed low salt diet and exercising as tolerated extensively o Discussed the benefits of weight loss . Patient self care activities - Over the next 90 days, patient will: o Check BP once a week, document, and provide at future appointments o Ensure daily salt intake < 2300 mg/day o Continue lifting weights and yoga in moderation 3 times a week for 30 mins  Hyperlipidemia Lab Results  Component Value Date/Time   LDLCALC 170 (H) 03/02/2020 07:32 AM   LDLDIRECT 143.5 01/15/2011 08:24 AM   . Pharmacist Clinical Goal(s): o Over the next 90 days, patient will work with PharmD and providers to achieve LDL goal < 100 . Current regimen:  o Rosuvastatin 10 mg 1 tablet daily . Interventions: o Discussed low cholesterol diet and exercising as tolerated extensively o Counseled patient on the risk vs benefit on  statin . Patient self care activities - Over the next 90 days, patient will: o Continue with lifestyle modifications o Avoid fast food o Continue yoga and lifting light weights  Diabetes Lab Results  Component Value Date/Time   HGBA1C 6.5 03/02/2020 07:32 AM   HGBA1C 6.1 12/08/2018 09:30 AM   . Pharmacist Clinical Goal(s): o Over the next 90 days, patient will work with PharmD and providers to achieve A1c goal <6.5% . Current regimen:  o Metformin 500 mg 0.5 tablet daily with breakfast . Interventions: o Discussed carbohydrate counting and exercising as tolerated extensively . Patient self care activities - Over the next 90 days, patient will: o Check blood sugar once daily, document, and provide at future appointments o Contact provider with any episodes of hypoglycemia o Continue to lose weight at least 10 lbs a month  o Continue lifting weights and yoga  Medication management . Pharmacist Clinical Goal(s): o Over the next 90 days, patient will work with PharmD and providers to achieve optimal medication adherence . Current pharmacy: [facility] . Interventions o Comprehensive medication review performed. o Utilize [facility] pharmacy for medication synchronization, packaging and delivery . Patient self care activities - Over the next 90 days, patient will: o Focus on medication adherence by medication synchronization and delivery o Take medications as prescribed o Report any questions or concerns to PharmD and/or provider(s)  Initial goal documentation  [name] was given information about Chronic Care Management services today including:  1. CCM service includes personalized support from designated clinical staff supervised by her physician, including individualized plan of care and coordination with other care providers 2. 24/7 contact phone numbers for assistance for urgent and routine care needs. 3. Standard insurance, coinsurance, copays and  deductibles apply for chronic care management only during months in which we provide at least 20 minutes of these services. Most insurances cover these services at 100%, however patients may be responsible for any copay, coinsurance and/or deductible if applicable. This service may help you avoid the need for more expensive face-to-face services. 4. Only one practitioner may furnish and bill the service in a calendar month. 5. The patient may stop CCM services at any time (effective at the end of the month) by phone call to the office staff.  Patient agreed to services and verbal consent obtained.   The patient verbalized understanding of instructions provided today and agreed to receive a mailed copy of patient instruction and/or educational materials. Face to Face appointment with pharmacist scheduled for: 06/12/20 at 10:30 AM  Geraldine Contras, PharmD Clinical Pharmacist [facility] [phone]   Cholesterol Content in Foods Cholesterol is a waxy, fat-like substance that helps to carry fat in the blood. The body needs cholesterol in small amounts, but too much cholesterol can cause damage to the arteries and heart. Most people should eat less than 200 milligrams (mg) of cholesterol a day. Foods with cholesterol  Cholesterol is found in animal-based foods, such as meat, seafood, and dairy. Generally, low-fat dairy and lean meats have less cholesterol than full-fat dairy and fatty meats. The milligrams of cholesterol per serving (mg per serving) of common cholesterol-containing foods are listed below. Meat and other proteins  Egg -- one large whole egg has 186 mg.  Veal shank -- 4 oz has 141 mg.  Lean ground [place] (93% lean) -- 4 oz has 118 mg.  Fat-trimmed lamb loin -- 4 oz has 106 mg.  Lean ground beef (90% lean) -- 4 oz has 100 mg.  Lobster -- 3.5 oz has 90 mg.  Pork loin chops -- 4 oz has 86 mg.  Canned salmon -- 3.5 oz has 83 mg.  Fat-trimmed beef top  loin -- 4 oz has 78 mg.  Frankfurter -- 1 frank (3.5 oz) has 77 mg.  Crab -- 3.5 oz has 71 mg.  Roasted chicken without skin, white meat -- 4 oz has 66 mg.  Light bologna -- 2 oz has 45 mg.  Deli-cut [place] -- 2 oz has 31 mg.  Canned tuna -- 3.5 oz has 31 mg.  Berniece Salines -- 1 oz has 29 mg.  Oysters and mussels (raw) -- 3.5 oz has 25 mg.  Mackerel -- 1 oz has 22 mg.  Trout -- 1 oz has 20 mg.  Pork sausage -- 1 link (1 oz) has 17 mg.  Salmon -- 1 oz has 16 mg.  Tilapia -- 1 oz has 14 mg. Dairy  Soft-serve ice cream --  cup (4 oz) has 103 mg.  Whole-milk yogurt -- 1 cup (8 oz) has 29 mg.  Cheddar cheese -- 1 oz has 28 mg.  American cheese -- 1 oz has 28 mg.  Whole milk -- 1 cup (8 oz) has 23 mg.  2% milk -- 1 cup (8 oz) has 18 mg.  Cream cheese -- 1 tablespoon (Tbsp) has 15 mg.  Cottage cheese --  cup (  4 oz) has 14 mg.  Low-fat (1%) milk -- 1 cup (8 oz) has 10 mg.  Sour cream -- 1 Tbsp has 8.5 mg.  Low-fat yogurt -- 1 cup (8 oz) has 8 mg.  Nonfat Greek yogurt -- 1 cup (8 oz) has 7 mg.  Half-and-half cream -- 1 Tbsp has 5 mg. Fats and oils  Cod liver oil -- 1 tablespoon (Tbsp) has 82 mg.  Butter -- 1 Tbsp has 15 mg.  Lard -- 1 Tbsp has 14 mg.  Bacon grease -- 1 Tbsp has 14 mg.  Mayonnaise -- 1 Tbsp has 5-10 mg.  Margarine -- 1 Tbsp has 3-10 mg. Exact amounts of cholesterol in these foods may vary depending on specific ingredients and brands. Foods without cholesterol Most plant-based foods do not have cholesterol unless you combine them with a food that has cholesterol. Foods without cholesterol include:  Grains and cereals.  Vegetables.  Fruits.  Vegetable oils, such as olive, canola, and sunflower oil.  Legumes, such as peas, beans, and lentils.  Nuts and seeds.  Egg whites. Summary  The body needs cholesterol in small amounts, but too much cholesterol can cause damage to the arteries and heart.  Most people should eat less than 200  milligrams (mg) of cholesterol a day. This information is not intended to replace advice given to you by your health care provider. Make sure you discuss any questions you have with your health care provider. Document Revised: 09/19/2017 Document Reviewed: 06/03/2017 Elsevier Patient Education  [place].

## 2020-04-10 ENCOUNTER — Telehealth: Payer: Self-pay

## 2020-04-10 ENCOUNTER — Other Ambulatory Visit: Payer: Self-pay

## 2020-04-10 MED ORDER — FAMOTIDINE 20 MG PO TABS: 20.0000 mg | ORAL_TABLET | Freq: Every day | ORAL | 5 refills | Status: DC

## 2020-04-10 MED ORDER — ALBUTEROL SULFATE HFA 108 (90 BASE) MCG/ACT IN AERS: 1.0000 | INHALATION_SPRAY | RESPIRATORY_TRACT | 1 refills | Status: DC | PRN

## 2020-04-10 MED ORDER — TRELEGY ELLIPTA 200-62.5-25 MCG/INH IN AEPB: 1.0000 | INHALATION_SPRAY | Freq: Every day | RESPIRATORY_TRACT | 5 refills | Status: DC

## 2020-04-10 NOTE — Telephone Encounter (Signed)
Kozlow, Donnamarie Poag, MD  [facility] Please address this request and contact [name]       Previous Messages   ----- Message -----  From: Eli Hose, [facility]  Sent: 04/07/2020  1:12 PM EDT  To: Jiles Prows, MD   Good afternoon, Dr. Neldon Mc. I had a visit 2 days ago with [name] concerning her medications. She is transferring pharmacies and I would like to reach out to you if you could send in new rxs for her trelegy, famotidine, and albuterol to [facility]. It is difficult for me to reach out to [facility] for transfer so I am hoping you could just send new rxs to her new pharmacy.   Thank you so much!   Geraldine Contras, PharmD  Clinical Pharmacist  [facility] [place]  [phone]     Sent in refills to pts pharmacy

## 2020-04-16 ENCOUNTER — Other Ambulatory Visit: Payer: Self-pay | Admitting: Adult Health

## 2020-04-17 LAB — HM DIABETES EYE EXAM

## 2020-04-17 NOTE — Telephone Encounter (Signed)
Pt stated she wants this one denied because she spoke to [place] and having this sent out to her instead

## 2020-04-18 ENCOUNTER — Other Ambulatory Visit: Payer: Self-pay | Admitting: Adult Health

## 2020-04-18 DIAGNOSIS — F411 Generalized anxiety disorder: Secondary | ICD-10-CM

## 2020-04-18 MED ORDER — FREESTYLE LIBRE 14 DAY SENSOR MISC: 1.0000 | 3 refills | Status: DC

## 2020-04-18 MED ORDER — ROSUVASTATIN CALCIUM 10 MG PO TABS: 10.0000 mg | ORAL_TABLET | Freq: Every day | ORAL | 0 refills | Status: DC

## 2020-04-18 MED ORDER — CITALOPRAM HYDROBROMIDE 10 MG PO TABS: 5.0000 mg | ORAL_TABLET | Freq: Every day | ORAL | 1 refills | Status: DC

## 2020-04-18 MED ORDER — LEVOTHYROXINE SODIUM 50 MCG PO TABS: ORAL_TABLET | ORAL | 0 refills | Status: DC

## 2020-04-18 MED ORDER — FREESTYLE LIBRE 14 DAY READER DEVI: 1.0000 | 3 refills | Status: DC

## 2020-04-18 MED ORDER — METFORMIN HCL 500 MG PO TABS: 250.0000 mg | ORAL_TABLET | Freq: Every day | ORAL | 0 refills | Status: DC

## 2020-04-18 MED ORDER — HYDROCHLOROTHIAZIDE 25 MG PO TABS: 25.0000 mg | ORAL_TABLET | Freq: Every day | ORAL | 0 refills | Status: DC

## 2020-04-19 ENCOUNTER — Encounter: Payer: Self-pay | Admitting: Family Medicine

## 2020-05-08 ENCOUNTER — Telehealth: Payer: Self-pay

## 2020-05-08 NOTE — Telephone Encounter (Signed)
Hi Cory,   Patient is getting medications coordinated for delivery tomorrow, 05/09/20.   Can you send in a refill for potassium chloride 59mEq for 90 day supply?  Thanks!!

## 2020-05-09 ENCOUNTER — Other Ambulatory Visit: Payer: Self-pay | Admitting: Adult Health

## 2020-05-09 MED ORDER — POTASSIUM CHLORIDE ER 10 MEQ PO TBCR: 10.0000 meq | EXTENDED_RELEASE_TABLET | Freq: Every day | ORAL | 3 refills | Status: DC

## 2020-05-09 NOTE — Telephone Encounter (Signed)
Not a problem.

## 2020-05-22 DIAGNOSIS — H18413 Arcus senilis, bilateral: Secondary | ICD-10-CM | POA: Diagnosis not present

## 2020-05-22 DIAGNOSIS — H43812 Vitreous degeneration, left eye: Secondary | ICD-10-CM | POA: Diagnosis not present

## 2020-05-22 DIAGNOSIS — H26491 Other secondary cataract, right eye: Secondary | ICD-10-CM | POA: Diagnosis not present

## 2020-05-22 DIAGNOSIS — H16223 Keratoconjunctivitis sicca, not specified as Sjogren's, bilateral: Secondary | ICD-10-CM | POA: Diagnosis not present

## 2020-06-02 ENCOUNTER — Ambulatory Visit: Payer: PPO | Admitting: Adult Health

## 2020-06-08 ENCOUNTER — Other Ambulatory Visit: Payer: Self-pay | Admitting: Adult Health

## 2020-06-08 ENCOUNTER — Ambulatory Visit (INDEPENDENT_AMBULATORY_CARE_PROVIDER_SITE_OTHER): Payer: PPO | Admitting: Adult Health

## 2020-06-08 ENCOUNTER — Other Ambulatory Visit: Payer: Self-pay

## 2020-06-08 ENCOUNTER — Encounter: Payer: Self-pay | Admitting: Adult Health

## 2020-06-08 VITALS — BP 140/80 | HR 67 | Temp 97.7°F | Ht 64.0 in | Wt 181.0 lb

## 2020-06-08 DIAGNOSIS — R7303 Prediabetes: Secondary | ICD-10-CM

## 2020-06-08 DIAGNOSIS — E785 Hyperlipidemia, unspecified: Secondary | ICD-10-CM | POA: Diagnosis not present

## 2020-06-08 LAB — LIPID PANEL
Cholesterol: 251 mg/dL — ABNORMAL HIGH (ref <200)
HDL: 62 mg/dL (ref >=50)
LDL Cholesterol (Calc): 171 mg/dL (calc) — ABNORMAL HIGH
Non-HDL Cholesterol (Calc): 189 mg/dL (calc) — ABNORMAL HIGH (ref <130)
Total CHOL/HDL Ratio: 4 (calc) (ref <5.0)
Triglycerides: 79 mg/dL (ref <150)

## 2020-06-08 LAB — POCT GLYCOSYLATED HEMOGLOBIN (HGB A1C): Hemoglobin A1C: 5.6 % (ref 4.0–5.6)

## 2020-06-08 NOTE — Progress Notes (Signed)
Subjective:    Patient ID: Amber Brewer, female    DOB: 1950/10/29, 69 y.o.   MRN: [id number]  HPI 70 year old female who  has a past medical history of Asthma, Chicken pox, DDD (degenerative disc disease), cervical, Depression, Gallstones, GERD (gastroesophageal reflux disease), melanoma in situ (2002), UTI (urinary tract infection), Hyperlipidemia, Hypertension, Hypothyroidism, IBS (irritable bowel syndrome), Melanoma ([place]), Nephrolithiasis, Osteoarthritis, PVC's (premature ventricular contractions), and Status post dilation of esophageal narrowing.   She presents to the office today for 28-month follow-up regarding pre diabetes.  She was last seen in May 2021 at which time her A1c was 6.5.  She is currently prescribed Metformin 250 mg twice daily. Over the last three months she has been working hard on lifestyle modifications and has been eating much better and exercising more. She has been able to lose about 17 pounds. She is feeling better overall and has more energy.   She did not start the statin medication because of all the bad things she has heard about statins. She would like to recheck her cholesterol level today   Lab Results  Component Value Date   HGBA1C 5.6 06/08/2020   Wt Readings from Last 3 Encounters:  06/08/20 181 lb (82.1 kg)  03/02/20 198 lb (89.8 kg)  01/25/20 197 lb 12.8 oz (89.7 kg)    Review of Systems See HPI   Past Medical History:  Diagnosis Date  . Asthma   . Chicken pox   . DDD (degenerative disc disease), cervical   . Depression   . Gallstones   . GERD (gastroesophageal reflux disease)   . Hx of melanoma in situ 2002  . Hx: UTI (urinary tract infection)   . Hyperlipidemia   . Hypertension   . Hypothyroidism   . IBS (irritable bowel syndrome)   . Melanoma ([place])    x2   . Nephrolithiasis   . Osteoarthritis   . PVC's (premature ventricular contractions)   . Status post dilation of esophageal narrowing     Social History    Socioeconomic History  . Marital status: Married    Spouse name: Not on file  . Number of children: 2  . Years of education: 59  . Highest education level: Not on file  Occupational History  . Occupation: licensed Engineer, site: OTHER    Comment: Office manager  Tobacco Use  . Smoking status: Never Smoker  . Smokeless tobacco: Never Used  Vaping Use  . Vaping Use: Never used  Substance and Sexual Activity  . Alcohol use: No  . Drug use: No  . Sexual activity: Yes    Birth control/protection: Post-menopausal, Surgical  Other Topics Concern  . Not on file  Social History Narrative   epworth sleepiness scale = 8 (01/18/2016)   Lives at home with her husband and disabled son   Right handed   Caffeine: decaf   Social Determinants of Health   Financial Resource Strain:   . Difficulty of Paying Living Expenses: Not on file  Food Insecurity:   . Worried About Charity fundraiser in the Last Year: Not on file  . Ran Out of Food in the Last Year: Not on file  Transportation Needs: No Transportation Needs  . Lack of Transportation (Medical): No  . Lack of Transportation (Non-Medical): No  Physical Activity: Insufficiently Active  . Days of Exercise per Week: 3 days  . Minutes of Exercise per Session: 30 min  Stress: Stress Concern Present  .  Feeling of Stress : To some extent  Social Connections:   . Frequency of Communication with Friends and Family: Not on file  . Frequency of Social Gatherings with Friends and Family: Not on file  . Attends Religious Services: Not on file  . Active Member of Clubs or Organizations: Not on file  . Attends Archivist Meetings: Not on file  . Marital Status: Not on file  Intimate Partner Violence:   . Fear of Current or Ex-Partner: Not on file  . Emotionally Abused: Not on file  . Physically Abused: Not on file  . Sexually Abused: Not on file    Past Surgical History:  Procedure Laterality Date  . ABDOMINAL  HYSTERECTOMY  1974  . APPENDECTOMY  1977  . BLADDER SURGERY  1974  . BREAST EXCISIONAL BIOPSY    . CATARACT EXTRACTION Bilateral 2015  . CHOLECYSTECTOMY  1987  . removal of melanoma in situ      Family History  Problem Relation Age of Onset  . Hypertension Mother   . Thyroid disease Mother   . Arthritis Mother   . Depression Mother   . Hearing loss Mother   . Miscarriages / [place] Mother   . Heart attack Father   . Heart disease Father   . Alcohol abuse Father   . Arthritis Father   . Depression Father   . Drug abuse Father   . Hypertension Father   . Alcohol abuse Sister   . Arthritis Sister   . Depression Sister   . Hearing loss Sister   . Hypotension Sister   . Hypertension Maternal Aunt   . Hypertension Maternal Uncle   . Clotting disorder Maternal Uncle   . Prostate cancer Maternal Uncle   . Colon cancer Maternal Uncle   . Colon polyps Maternal Uncle   . Diabetes Maternal Uncle        all uncles  . Arthritis Maternal Grandmother   . Asthma Maternal Grandmother   . Breast cancer Maternal Grandmother   . Stomach cancer Maternal Grandmother   . Hearing loss Maternal Grandmother   . Diabetes Maternal Grandfather   . Heart disease Maternal Grandfather   . Arthritis Maternal Grandfather   . Hearing loss Maternal Grandfather   . Hypertension Maternal Grandfather   . Kidney disease Maternal Grandfather   . Hyperlipidemia Paternal Grandmother        also MI  . Breast cancer Paternal Grandmother   . Arthritis Paternal Grandmother   . Heart attack Paternal Grandmother   . Hyperlipidemia Paternal Grandfather        also MI  . Heart disease Paternal Grandfather   . Heart attack Paternal Grandfather   . Depression Sister   . Heart disease Sister   . Hyperlipidemia Sister   . Hypertension Son   . Endometriosis Daughter     Allergies  Allergen Reactions  . Codeine Phosphate Nausea And Vomiting    REACTION: unspecified  . Prednisone Shortness Of Breath  .  Betadine [Povidone Iodine] Hives    After surgical skin prep  . Sulfa Antibiotics Nausea And Vomiting  . Iodine Rash    Broke out on skin once years ago--**Betadine Skin Prep**    Current Outpatient Medications on File Prior to Visit  Medication Sig Dispense Refill  . albuterol (PROAIR HFA) 108 (90 Base) MCG/ACT inhaler Inhale 1-2 puffs into the lungs every 4 (four) hours as needed. 18 g 1  . budesonide (RHINOCORT ALLERGY) 32 MCG/ACT nasal spray  Place 1 spray into both nostrils daily as needed for allergies.     . carvedilol (COREG) 12.5 MG tablet Take 1 tablet (12.5 mg total) by mouth 2 (two) times daily with a meal. 180 tablet 3  . cholecalciferol (VITAMIN D) 1000 UNITS tablet Take 2,000 Units by mouth daily.     . citalopram (CELEXA) 10 MG tablet Take 0.5 tablets (5 mg total) by mouth daily. Takes 0.5 tablet of 10 mg 45 tablet 1  . Continuous Blood Gluc Receiver (FREESTYLE LIBRE 14 DAY READER) DEVI 1 Device by Does not apply route every 14 (fourteen) days. 6 each 3  . Continuous Blood Gluc Sensor (FREESTYLE LIBRE 14 DAY SENSOR) MISC 1 Device by Does not apply route every 14 (fourteen) days. 6 each 3  . cycloSPORINE (RESTASIS) 0.05 % ophthalmic emulsion Place 1 drop into both eyes 2 (two) times daily.     . DOTTI 0.05 MG/24HR patch APPLY 1 PATCH TOPICALLY ONTO THE SKIN 2 TIMES A WEEK (Patient taking differently: 0.5 patches. Uses 0.5 patch twice a week) 24 patch 2  . famotidine (PEPCID) 20 MG tablet Take 1 tablet (20 mg total) by mouth daily. 30 tablet 5  . Flaxseed, Linseed, (FLAX SEED OIL) 1000 MG CAPS Take 1 capsule by mouth daily.     . Fluticasone-Umeclidin-Vilant (TRELEGY ELLIPTA) 200-62.5-25 MCG/INH AEPB Inhale 1 puff into the lungs daily. 60 each 5  . hydrochlorothiazide (HYDRODIURIL) 25 MG tablet Take 1 tablet (25 mg total) by mouth daily. 90 tablet 0  . irbesartan (AVAPRO) 150 MG tablet Take 2 tablets (300 mg total) by mouth daily. (Patient taking differently: Take 150 mg by mouth  in the morning and at bedtime. ) 180 tablet 3  . levothyroxine (SYNTHROID) 50 MCG tablet TAKE 1 TABLET(50 MCG) BY MOUTH DAILY BEFORE BREAKFAST 90 tablet 0  . metFORMIN (GLUCOPHAGE) 500 MG tablet Take 0.5 tablets (250 mg total) by mouth daily with breakfast. 180 tablet 0  . Omega-3 Fatty Acids (FISH OIL) 1000 MG CAPS Take 1 capsule by mouth daily.     . potassium chloride (KLOR-CON) 10 MEQ tablet Take 1 tablet (10 mEq total) by mouth daily. 90 tablet 3  . rosuvastatin (CRESTOR) 10 MG tablet Take 1 tablet (10 mg total) by mouth daily. 90 tablet 0  . TURMERIC PO Take by mouth.    . Zinc 100 MG TABS Take 1 tablet by mouth daily.     No current facility-administered medications on file prior to visit.    BP 140/80   Pulse 67   Temp 97.7 F (36.5 C) (Other (Comment))   Ht 5\' 4"  (1.626 m)   Wt 181 lb (82.1 kg)   SpO2 98%   BMI 31.07 kg/m       Objective:   Physical Exam Vitals and nursing note reviewed.  Constitutional:      Appearance: Normal appearance.  Cardiovascular:     Rate and Rhythm: Normal rate and regular rhythm.     Pulses: Normal pulses.     Heart sounds: Normal heart sounds.  Skin:    General: Skin is warm and dry.  Neurological:     General: No focal deficit present.     Mental Status: She is alert and oriented to person, place, and time.  Psychiatric:        Mood and Affect: Mood normal.        Behavior: Behavior normal.        Thought Content: Thought content normal.  Judgment: Judgment normal.           Assessment & Plan:  1. Pre-diabetes - She looks great! Congratulated on making lifestyle modifications  - POC HgB A1c- 5.6  - will have her stop metformin and see how she does over the next three months  2. Hyperlipidemia, unspecified hyperlipidemia type  - Lipid panel; Future   [name], NP

## 2020-06-08 NOTE — Patient Instructions (Signed)
Your A1c is 5.6! That is amazing! Keep up the good work!  I am going to take you off Metformin and have you follow up in 3 months   We will follow up with you regarding your blood work

## 2020-06-09 ENCOUNTER — Telehealth: Payer: Self-pay

## 2020-06-09 NOTE — Progress Notes (Signed)
Spoke to patient to confirmed patient telephone appointment on 06/12/2020 for CCM at 10:30 am with Junius Argyle the Clinical pharmacist.   Patient verbalized understanding  [facility] Pharmacist Assistant [phone]

## 2020-06-12 ENCOUNTER — Ambulatory Visit: Payer: PPO

## 2020-06-12 DIAGNOSIS — I10 Essential (primary) hypertension: Secondary | ICD-10-CM

## 2020-06-12 DIAGNOSIS — E785 Hyperlipidemia, unspecified: Secondary | ICD-10-CM

## 2020-06-12 NOTE — Patient Instructions (Signed)
Visit Information It was great speaking with you today!  Please let me know if you have any questions about our visit. Goals Addressed            This Visit's Progress    Chronic Care Management       CARE PLAN ENTRY  Current Barriers:   Chronic Disease Management support, education, and care coordination needs related to Hypertension, Hyperlipidemia, and Diabetes   Hypertension BP Readings from Last 3 Encounters:  06/08/20 140/80  03/28/20 122/64  03/02/20 132/80    Pharmacist Clinical Goal(s): o Over the next 90 days, patient will work with PharmD and providers to maintain BP goal <140/90  Current regimen:   Carvedilol 12.5 mg 1 tablet twice daily with a meal  HCTZ 25 mg 1 tablet daily at lunch  Irbesartan 150 mg 1 tablet in the morning and at night  Interventions: o Discussed low salt diet and exercising as tolerated extensively o Discussed the benefits of weight loss  Patient self care activities - Over the next 90 days, patient will: o Check BP once a week, document, and provide at future appointments o Ensure daily salt intake < 2300 mg/day o Continue lifting weights and yoga in moderation 3 times a week for 30 mins  Hyperlipidemia Lab Results  Component Value Date/Time   LDLCALC 171 (H) 06/08/2020 09:25 AM   LDLDIRECT 143.5 01/15/2011 08:24 AM    Pharmacist Clinical Goal(s): o Over the next 90 days, patient will work with PharmD and providers to achieve LDL goal < 100  Current regimen:  o Rosuvastatin 10 mg 1 tablet daily  Interventions: o Discussed low cholesterol diet and exercising as tolerated extensively o Counseled patient on the risk vs benefit on statin  Patient self care activities - Over the next 90 days, patient will: o Continue with lifestyle modifications o Avoid fast food o Continue yoga and lifting light weights  Diabetes Lab Results  Component Value Date/Time   HGBA1C 5.6 06/08/2020 08:58 AM   HGBA1C 6.5 03/02/2020 07:32 AM    HGBA1C 6.1 12/08/2018 09:30 AM    Pharmacist Clinical Goal(s): o Over the next 90 days, patient will work with PharmD and providers to achieve A1c goal <6.5%  Current regimen:  o Metformin 500 mg 0.5 tablet daily with breakfast  Interventions: o Discussed carbohydrate counting and exercising as tolerated extensively  Patient self care activities - Over the next 90 days, patient will: o Check blood sugar once daily, document, and provide at future appointments o Contact provider with any episodes of hypoglycemia o Continue to lose weight at least 10 lbs a month  o Continue lifting weights and yoga  Medication management  Pharmacist Clinical Goal(s): o Over the next 90 days, patient will work with PharmD and providers to achieve optimal medication adherence  Current pharmacy: [facility]  Interventions o Comprehensive medication review performed. o Utilize [facility] pharmacy for medication synchronization, packaging and delivery  Patient self care activities - Over the next 90 days, patient will: o Focus on medication adherence by medication synchronization and delivery o Take medications as prescribed o Report any questions or concerns to PharmD and/or provider(s)  Initial goal documentation        The patient verbalized understanding of instructions provided today and agreed to receive a mailed copy of patient instruction and/or educational materials.  The pharmacy team will reach out to the patient again over the next 14 days.   Doristine Section Clinical Pharmacist [facility]  [facility] [phone]

## 2020-06-12 NOTE — Chronic Care Management (AMB) (Signed)
Chronic Care Management Pharmacy  Name: Amber Brewer  MRN: [id number] DOB: [birth year]    Chief Complaint/ HPI  Amber Brewer,  69 y.o. , female presents for their Follow-Up CCM visit with the clinical pharmacist via telephone.   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 They have been married for 17 years and she lives with him and her son that is disabled and bedridden. They are both opticians and has businesses before in the [place]. Her husband had recent brain surgery 3 years ago. She is the primary caregiver for her mother, son, and her husband. She suffered from anxiety from all the stress around her, but mentions that citalopram helped her a lot. She mentions that she is just taking half tablet of 10 mg and she feels great. Patient reports changing her diet and losing 10 lbs in the last month. Patient is adamant that she doesn't want to take the statin and that was prescribed to her that is why she changed her eating habits and exercise routine. She mentions eating more fruits and vegetables and fruits and no more fast food. Her mother likes to eat fast food, but since she is the primary caregiver for her and feed her, she also eats fast food and she mentions that is the cause of her cholesterol being high. She mentions not eating fast food ever again.   PCP : [name], NP  Their chronic conditions include: HTN, HLD, pre-diabetes, asthma, hypothyroidism, anxiety  Office Visits: 06/08/20: Patient presented to [name], NP for follow-up. Metformin stopped.  03/02/20 OV - General medical exam. Advised to work on weight loss. Patient stable and no changes with medications.  Consult Visit: 01/25/20 OV ([facility], Allergy) - f/u visit. No exacerbation for the past months and can exercise moderately for 15 mins without much difficulty. Switched [employer] to [employer].    Medications: Outpatient Encounter Medications as of 06/12/2020  Medication Sig   budesonide (RHINOCORT ALLERGY) 32 MCG/ACT nasal spray Place 1 spray into both nostrils daily as needed for allergies.    carvedilol (COREG) 12.5 MG tablet Take 1 tablet (12.5 mg total) by mouth 2 (two) times daily with a meal.   Cholecalciferol (VITAMIN D) 50 MCG (2000 UT) tablet Take 2,000 Units by mouth daily.    citalopram (CELEXA) 10 MG tablet Take 0.5 tablets (5 mg total) by mouth daily. Takes 0.5 tablet of 10 mg   DOTTI 0.05 MG/24HR patch APPLY 1 PATCH TOPICALLY ONTO THE SKIN 2 TIMES A WEEK (Patient taking differently: 0.5 patches. Uses 0.5 patch twice a week)   famotidine (PEPCID) 20 MG tablet Take 1 tablet (20 mg total) by mouth daily.   Fluticasone-Umeclidin-Vilant (TRELEGY ELLIPTA) 200-62.5-25 MCG/INH AEPB Inhale 1 puff into the lungs daily.   hydrochlorothiazide (HYDRODIURIL) 25 MG tablet Take 1 tablet (25 mg total) by mouth daily.   irbesartan (AVAPRO) 150 MG tablet Take 2 tablets (300 mg total) by mouth daily. (Patient taking differently: Take 150 mg by mouth in the morning and at bedtime. )   levothyroxine (SYNTHROID) 50 MCG tablet TAKE 1 TABLET(50 MCG) BY MOUTH DAILY BEFORE BREAKFAST   Multiple Vitamins-Minerals (ALIVE WOMENS GUMMY PO) Take 1 tablet by mouth daily.   multivitamin-lutein (OCUVITE-LUTEIN) CAPS capsule Take 1 capsule by mouth daily.   Omega-3 Fatty Acids (FISH OIL PO) Take 540 mg by mouth daily. Nature Made Mini Omega-3   potassium  chloride (KLOR-CON) 10 MEQ tablet Take 1 tablet (10 mEq total) by mouth daily.   rosuvastatin (CRESTOR) 10 MG tablet Take 1 tablet (10 mg total) by mouth daily.   TURMERIC PO Take 500 mg by mouth daily.    albuterol (PROAIR HFA) 108 (90 Base) MCG/ACT inhaler Inhale 1-2 puffs into the lungs every 4 (four) hours as needed.   Continuous Blood Gluc Receiver (FREESTYLE LIBRE 14 DAY READER) DEVI 1 Device by Does not apply route every 14  (fourteen) days.   Continuous Blood Gluc Sensor (FREESTYLE LIBRE 14 DAY SENSOR) MISC 1 Device by Does not apply route every 14 (fourteen) days.   cycloSPORINE (RESTASIS) 0.05 % ophthalmic emulsion Place 1 drop into both eyes 2 (two) times daily.    Flaxseed, Linseed, (FLAX SEED OIL) 1000 MG CAPS Take 1 capsule by mouth daily.    Zinc 100 MG TABS Take 1 tablet by mouth daily.   No facility-administered encounter medications on file as of 06/12/2020.     Physical Activity: Insufficiently Active   Days of Exercise per Week: 3 days   Minutes of Exercise per Session: 30 min     Transportation Needs: No Transportation Needs   Lack of Transportation (Medical): No   Lack of Transportation (Non-Medical): No     Stress: Stress Concern Present   Feeling of Stress : To some extent    Current Diagnosis/Assessment:  Goals Addressed            This Visit's Progress    Chronic Care Management       CARE PLAN ENTRY  Current Barriers:   Chronic Disease Management support, education, and care coordination needs related to Hypertension, Hyperlipidemia, and Diabetes   Hypertension BP Readings from Last 3 Encounters:  06/08/20 140/80  03/28/20 122/64  03/02/20 132/80    Pharmacist Clinical Goal(s): o Over the next 90 days, patient will work with PharmD and providers to maintain BP goal <140/90  Current regimen:   Carvedilol 12.5 mg 1 tablet twice daily with a meal  HCTZ 25 mg 1 tablet daily at lunch  Irbesartan 150 mg 1 tablet in the morning and at night  Interventions: o Discussed low salt diet and exercising as tolerated extensively o Discussed the benefits of weight loss  Patient self care activities - Over the next 90 days, patient will: o Check BP once a week, document, and provide at future appointments o Ensure daily salt intake < 2300 mg/day o Continue lifting weights and yoga in moderation 3 times a week for 30 mins  Hyperlipidemia Lab Results  Component  Value Date/Time   LDLCALC 171 (H) 06/08/2020 09:25 AM   LDLDIRECT 143.5 01/15/2011 08:24 AM    Pharmacist Clinical Goal(s): o Over the next 90 days, patient will work with PharmD and providers to achieve LDL goal < 100  Current regimen:  o Rosuvastatin 10 mg 1 tablet daily  Interventions: o Discussed low cholesterol diet and exercising as tolerated extensively o Counseled patient on the risk vs benefit on statin  Patient self care activities - Over the next 90 days, patient will: o Continue with lifestyle modifications o Avoid fast food o Continue yoga and lifting light weights  Diabetes Lab Results  Component Value Date/Time   HGBA1C 5.6 06/08/2020 08:58 AM   HGBA1C 6.5 03/02/2020 07:32 AM   HGBA1C 6.1 12/08/2018 09:30 AM    Pharmacist Clinical Goal(s): o Over the next 90 days, patient will work with PharmD and providers to achieve A1c goal <  6.5%  Current regimen:  o Metformin 500 mg 0.5 tablet daily with breakfast  Interventions: o Discussed carbohydrate counting and exercising as tolerated extensively  Patient self care activities - Over the next 90 days, patient will: o Check blood sugar once daily, document, and provide at future appointments o Contact provider with any episodes of hypoglycemia o Continue to lose weight at least 10 lbs a month  o Continue lifting weights and yoga  Medication management  Pharmacist Clinical Goal(s): o Over the next 90 days, patient will work with PharmD and providers to achieve optimal medication adherence  Current pharmacy: [facility]  Interventions o Comprehensive medication review performed. o Utilize [facility] pharmacy for medication synchronization, packaging and delivery  Patient self care activities - Over the next 90 days, patient will: o Focus on medication adherence by medication synchronization and delivery o Take medications as prescribed o Report any questions or concerns to PharmD and/or  provider(s)  Initial goal documentation        Hypertension    Office blood pressures are  BP Readings from Last 3 Encounters:  06/08/20 140/80  03/28/20 122/64  03/02/20 132/80    Patient has failed these meds in the past: bystolic Patient is currently controlled on the following medications:   Carvedilol 12.5 mg 1 tablet twice daily with a meal  HCTZ 25 mg 1 tablet daily at lunch  Irbesartan 150 mg 1 tablet in the morning and at night  Patient checks BP at home several times per month   127/78   BP stable < 140/90. Patient recently changed diet and exercise habits.   Plan  Continue current medications and control with diet and exercise     Hyperlipidemia   Lipid Panel     Component Value Date/Time   CHOL 251 (H) 06/08/2020 0925   TRIG 79 06/08/2020 0925   HDL 62 06/08/2020 0925   CHOLHDL 4.0 06/08/2020 0925   VLDL 16.8 03/02/2020 0732   LDLCALC 171 (H) 06/08/2020 0925   LDLDIRECT 143.5 01/15/2011 0824     The 10-year ASCVD risk score Mikey Bussing DC Jr., et al., 2013) is: 14.1%   Values used to calculate the score:     Age: 74 years     Sex: Female     Is Non-Hispanic African American: No     Diabetic: No     Tobacco smoker: No     Systolic Blood Pressure: 878 mmHg     Is BP treated: Yes     HDL Cholesterol: 62 mg/dL     Total Cholesterol: 251 mg/dL   Patient has failed these meds in past: none Patient is currently uncontrolled on the following medications:   Rosuvastatin 10 mg 1 tablet daily  Patient restarted rosuvastatin after last office visit, denies any side effects at this time. Counseled her on the importance of adherence to the medicine to reach LDL <100.   Plan  Continue current medications  Hypothyroidism   Lab Results  Component Value Date/Time   TSH 4.73 (H) 03/02/2020 07:32 AM   TSH 7.22 (H) 12/08/2018 09:30 AM   TSH 6.15 (H) 05/06/2018 09:27 AM    Patient has failed these meds in past: npne Patient is currently controlled  on the following medications:   Synthroid 50 mcg one tablet daily before breakfast (Brand Name Only)   Plan  Continue current medications   Pre-Diabetes   Recent Relevant Labs: Lab Results  Component Value Date/Time   HGBA1C 5.6 06/08/2020 08:58 AM  HGBA1C 6.5 03/02/2020 07:32 AM   HGBA1C 6.1 12/08/2018 09:30 AM   GFR 60.50 03/02/2020 07:32 AM   GFR 65.48 09/24/2019 10:04 AM     Checking BG: on CGM  Patient has failed these meds in past: None Patient is currently controlled on the following medications:   Last diabetic Eye exam:  Lab Results  Component Value Date/Time   HMDIABEYEEXA No Retinopathy 04/17/2020 12:00 AM    Last diabetic Foot exam: No results found for: HMDIABFOOTEX   A1c within goal < 7%. Patient lost 17.5 lbs in just a month and started changing her diet and exercise habits. Denies fast food. Patient reports lifting light weights and yoga at their workout room. She wants to continue to lose another 25 pounds. Congratulated her on her success and encouraged her to continue working on long-term lifestyle changes to continue her weight loss goal.   Plan  Continue control with diet and exercise    General Anxiety Disorder   Patient has failed these meds in past: diazepam Patient is currently controlled on the following medications:   Citalopram 10 mg 0.5 tablet daily  Patient reports having better control on her anxiety. She was in a lot of stress before taking care of her mother, husband, and son, but it went a lot better since starting citalopram.  Plan  Continue current medications    Asthma   Patient has failed these meds in past: arnuity Patient is currently controlled on the following medications:   Albuterol HFA 1-2 puffs into the lungs every 4 hrs as needed  Trelegy Ellipta 1 puff into the lungs daily  Follow by Dr. Neldon Mc (Allergy and Immunology). Patient rarely uses albuterol. Patient reports tolerating moderate exercises at  home.  She reports significant concerns with affording Trelegy.   Plan  Continue current medications and control with diet and exercise  Will apply for PAP for Trelegy   OTCs/Health Maintenance    Vitamin D 2000 units take 1 tablet daily  Restasis 0.05% eye drop 1 drop into both eyes twice daily  Dotti 0.05 mg/24 hr patch apply 10.5 patch onto the skin twice a week (Patient uses estradiol patches)   Famotidine 20 mg 1 tablet daily  Nature Made 540 mg Omega-3 Fish Oil.   Turmeric 1 capsule once daily  Budesonide 32 mcg/act nasa; spray 1 spray into both nostrils daily as needed  Alive Women's Gummy 1 daily   We discussed discontinuing taking her zinc and flax seed and and start on a multivitamin for adults. Patient reports cutting her Dotti to half patch twice a week since she denies having hot flashes at night even with just half patch.  Vaccines   Reviewed and discussed patient's vaccination history.    Immunization History  Administered Date(s) Administered   Influenza Split 09/05/2015   Influenza Whole 09/10/2007, 07/20/2009, 07/27/2011   Influenza, High Dose Seasonal PF 07/24/2019   Influenza,inj,Quad PF,6+ Mos 08/22/2018   Pneumococcal Conjugate-13 12/10/2016   Pneumococcal Polysaccharide-23 12/16/2017   Td 10/21/2002   Tdap 01/29/2011   Plan  Recommended patient receive shingrix vaccine in the office/pharmacy.    Medication Management   Pt uses [facility] for all medications.   Reviewed chart for medication changes ahead of medication coordination call. 06/08/20: Patient presented to [name], NP for follow-up. Metformin stopped.   BP Readings from Last 3 Encounters:  06/08/20 140/80  03/28/20 122/64  03/02/20 132/80    Lab Results  Component Value Date   HGBA1C 5.6  06/08/2020     Patient obtains medications through Adherence Pack[facility]  30 Days   Patient is due for next adherence delivery on: 06/24/20. Called patient and  reviewed medications and coordinated delivery.  This delivery to include:  Carvedilol 12.5 mg twice daily - Breakfast, Supper   Citalopram 10 mg  tablet daily - Breakfast   Famotidine 20 mg daily - Supper   HCTZ 25 mg daily - Lunch   Irbesartan 150 mg twice daily- Breakfast, supper   Rosuvastatin 10 mg daily - Supper    Potassium Chloride 10 meQ daily - Lunch   Vitamin D3 2000 units daily - supper   Rhinocort Allergy 32 mcg/act   Turmeric 500 mg daily- Lunch   Fish Oil 540 mg daily  (Nature Made Mini Omega)   [place] Adult Multivitamin 50+ 1 capsule daily   Alive Multivitamin 1 gummy daily  Patient will need a short fill of Synthroid 50 mcg on 07/04/20 to align on sync date   Patient declined the following medications:  Trelegy- Patient has unopened inhaler remaining, applying for PAP to help with medication costs.   Albuterol HFA- Rarely uses, expires in March 2022.   Estradiol Patches- Patient has adequate supply  Patient needs refills for HCTZ. Request sent to PCP team.  Confirmed delivery date of 06/20/20, advised patient that pharmacy will contact them the morning of delivery.  Follow up: 6 month office visit  [place] Pharmacist [facility] [place]

## 2020-06-13 ENCOUNTER — Other Ambulatory Visit: Payer: Self-pay

## 2020-06-13 MED ORDER — HYDROCHLOROTHIAZIDE 25 MG PO TABS
25.0000 mg | ORAL_TABLET | Freq: Every day | ORAL | 0 refills | Status: DC
Start: 2020-06-13 — End: 2020-09-12

## 2020-06-16 ENCOUNTER — Telehealth: Payer: Self-pay

## 2020-06-16 NOTE — Progress Notes (Signed)
Chronic Care Management Pharmacy Assistant   Name: Amber Brewer  MRN: [id number] DOB: Aug 12, 1951  Reason for Encounter: Medication Review/Patient assistance for Trelergy.    PCP : [name], NP  Allergies:   Allergies  Allergen Reactions  . Codeine Phosphate Nausea And Vomiting    REACTION: unspecified  . Prednisone Shortness Of Breath  . Betadine [Povidone Iodine] Hives    After surgical skin prep  . Sulfa Antibiotics Nausea And Vomiting  . Iodine Rash    Broke out on skin once years ago--**Betadine Skin Prep**    Medications: Outpatient Encounter Medications as of 06/16/2020  Medication Sig  . albuterol (PROAIR HFA) 108 (90 Base) MCG/ACT inhaler Inhale 1-2 puffs into the lungs every 4 (four) hours as needed.  . budesonide (RHINOCORT ALLERGY) 32 MCG/ACT nasal spray Place 1 spray into both nostrils daily as needed for allergies.   . carvedilol (COREG) 12.5 MG tablet Take 1 tablet (12.5 mg total) by mouth 2 (two) times daily with a meal.  . Cholecalciferol (VITAMIN D) 50 MCG (2000 UT) tablet Take 2,000 Units by mouth daily.   . citalopram (CELEXA) 10 MG tablet Take 0.5 tablets (5 mg total) by mouth daily. Takes 0.5 tablet of 10 mg  . Continuous Blood Gluc Receiver (FREESTYLE LIBRE 14 DAY READER) DEVI 1 Device by Does not apply route every 14 (fourteen) days.  . Continuous Blood Gluc Sensor (FREESTYLE LIBRE 14 DAY SENSOR) MISC 1 Device by Does not apply route every 14 (fourteen) days.  . cycloSPORINE (RESTASIS) 0.05 % ophthalmic emulsion Place 1 drop into both eyes 2 (two) times daily.   . DOTTI 0.05 MG/24HR patch APPLY 1 PATCH TOPICALLY ONTO THE SKIN 2 TIMES A WEEK (Patient taking differently: 0.5 patches. Uses 0.5 patch twice a week)  . famotidine (PEPCID) 20 MG tablet Take 1 tablet (20 mg total) by mouth daily.  . Flaxseed, Linseed, (FLAX SEED OIL) 1000 MG CAPS Take 1 capsule by mouth daily.   . Fluticasone-Umeclidin-Vilant (TRELEGY ELLIPTA) 200-62.5-25 MCG/INH  AEPB Inhale 1 puff into the lungs daily.  . hydrochlorothiazide (HYDRODIURIL) 25 MG tablet Take 1 tablet (25 mg total) by mouth daily.  . irbesartan (AVAPRO) 150 MG tablet Take 2 tablets (300 mg total) by mouth daily. (Patient taking differently: Take 150 mg by mouth in the morning and at bedtime. )  . levothyroxine (SYNTHROID) 50 MCG tablet TAKE 1 TABLET(50 MCG) BY MOUTH DAILY BEFORE BREAKFAST  . Multiple Vitamins-Minerals (ALIVE WOMENS GUMMY PO) Take 1 tablet by mouth daily.  . multivitamin-lutein (OCUVITE-LUTEIN) CAPS capsule Take 1 capsule by mouth daily.  . Omega-3 Fatty Acids (FISH OIL PO) Take 540 mg by mouth daily. Nature Made Mini Omega-3  . potassium chloride (KLOR-CON) 10 MEQ tablet Take 1 tablet (10 mEq total) by mouth daily.  . rosuvastatin (CRESTOR) 10 MG tablet Take 1 tablet (10 mg total) by mouth daily.  . TURMERIC PO Take 500 mg by mouth daily.   . Zinc 100 MG TABS Take 1 tablet by mouth daily.   No facility-administered encounter medications on file as of 06/16/2020.    Current Diagnosis: Patient Active Problem List   Diagnosis Date Noted  . Myofascial pain syndrome, cervical 06/03/2018  . PVC's (premature ventricular contractions) 10/31/2016  . Benign hypertension 10/31/2016  . Anxiety 10/31/2016  . Hyperlipidemia 05/27/2016  . LEG PAIN, BILATERAL 06/07/2010  . LEG EDEMA, BILATERAL 10/04/2009  . HYPOKALEMIA 07/20/2009  . ALLERGY, HX OF 11/24/2008  . Anxiety disorder 05/19/2008  .  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 Pain in the chest 05/19/2008  . 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 HYPOTHYROIDISM, BORDERLINE 12/16/2007  . PANIC ATTACK 06/04/2007  . DISORDER, 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  . Essential hypertension 05/25/2007  . OSTEOARTHRITIS 05/25/2007  . NEPHROLITHIASIS, HX OF 05/25/2007  . ARTHRITIS, HX OF 05/25/2007    Goals Addressed   None     Follow-Up:  Patient Assistance Coordination   Completing  patient assistance application for Trelegy   from [place] , awaiting on pharmacist review of patient document   [place] Pharmacist Assistant [phone]

## 2020-06-20 ENCOUNTER — Ambulatory Visit: Payer: PPO | Admitting: Allergy and Immunology

## 2020-06-20 ENCOUNTER — Other Ambulatory Visit: Payer: Self-pay

## 2020-06-20 ENCOUNTER — Encounter: Payer: Self-pay | Admitting: Allergy and Immunology

## 2020-06-20 VITALS — BP 126/72 | HR 57 | Temp 98.0°F | Resp 18

## 2020-06-20 DIAGNOSIS — J4540 Moderate persistent asthma, uncomplicated: Secondary | ICD-10-CM

## 2020-06-20 DIAGNOSIS — J3089 Other allergic rhinitis: Secondary | ICD-10-CM | POA: Diagnosis not present

## 2020-06-20 DIAGNOSIS — K219 Gastro-esophageal reflux disease without esophagitis: Secondary | ICD-10-CM | POA: Diagnosis not present

## 2020-06-20 NOTE — Progress Notes (Signed)
[facility]   Follow-up Note  Referring Provider: [name], NP Primary Provider: [name], NP Date of Office Visit: 06/20/2020  Subjective:   Amber Brewer (DOB: [birth year]) is a 69 y.o. female who returns to the [facility] on 06/20/2020 in re-evaluation of the following:  HPI: Amber Brewer returns to this clinic in evaluation of asthma and allergic rhinitis and LPR.  Her last visit to this clinic was 28 March 2020.  She is doing wonderful with her airway and has the best breathing she has had in years.  All of her air hunger and coughing has resolved.  She does not need to use a short acting bronchodilator.  She has not required a systemic steroid or antibiotic for any type of airway issue.  She has had no problems with her nose while intermittently using a nasal steroid.  She has no problems with reflux while using famotidine.  She has lost 17 pounds of weight since she started attending to her diagnosis of diabetes.  Allergies as of 06/20/2020      Reactions   Codeine Phosphate Nausea And Vomiting   REACTION: unspecified   Prednisone Shortness Of Breath   Betadine [povidone Iodine] Hives   After surgical skin prep   Sulfa Antibiotics Nausea And Vomiting   Iodine Rash   Broke out on skin once years ago--**Betadine Skin Prep**      Medication List      albuterol 108 (90 Base) MCG/ACT inhaler Commonly known as: ProAir HFA Inhale 1-2 puffs into the lungs every 4 (four) hours as needed.   carvedilol 12.5 MG tablet Commonly known as: COREG Take 1 tablet (12.5 mg total) by mouth 2 (two) times daily with a meal.   citalopram 10 MG tablet Commonly known as: CELEXA Take 0.5 tablets (5 mg total) by mouth daily. Takes 0.5 tablet of 10 mg   cycloSPORINE 0.05 % ophthalmic emulsion Commonly known as: RESTASIS Place 1 drop into both eyes 2 (two) times daily.   Dotti 0.05 MG/24HR patch Generic drug:  estradiol APPLY 1 PATCH TOPICALLY ONTO THE SKIN 2 TIMES A WEEK   famotidine 20 MG tablet Commonly known as: PEPCID Take 1 tablet (20 mg total) by mouth daily.   FISH OIL PO Take 540 mg by mouth daily. Nature Made Mini Omega-3   Flax Seed Oil 1000 MG Caps Take 1 capsule by mouth daily.   FreeStyle Libre 14 Day Reader Kerrin Mo 1 Device by Does not apply route every 14 (fourteen) days.   FreeStyle Libre 14 Day Sensor Misc 1 Device by Does not apply route every 14 (fourteen) days.   hydrochlorothiazide 25 MG tablet Commonly known as: HYDRODIURIL Take 1 tablet (25 mg total) by mouth daily.   irbesartan 150 MG tablet Commonly known as: AVAPRO Take 2 tablets (300 mg total) by mouth daily.   levothyroxine 50 MCG tablet Commonly known as: Synthroid TAKE 1 TABLET(50 MCG) BY MOUTH DAILY BEFORE BREAKFAST   multivitamin-lutein Caps capsule Take 1 capsule by mouth daily.   ALIVE WOMENS GUMMY PO Take 1 tablet by mouth daily.   potassium chloride 10 MEQ tablet Commonly known as: KLOR-CON Take 1 tablet (10 mEq total) by mouth daily.   Rhinocort Allergy 32 MCG/ACT nasal spray Generic drug: budesonide Place 1 spray into both nostrils daily as needed for allergies.   rosuvastatin 10 MG tablet Commonly known as: Crestor Take 1 tablet (10 mg total) by mouth  daily.   Trelegy Ellipta 200-62.5-25 MCG/INH Aepb Generic drug: Fluticasone-Umeclidin-Vilant Inhale 1 puff into the lungs daily.   TURMERIC PO Take 500 mg by mouth daily.   Vitamin D 50 MCG (2000 UT) tablet Take 2,000 Units by mouth daily.   Zinc 100 MG Tabs Take 1 tablet by mouth daily.       Past Medical History:  Diagnosis Date  . Asthma   . Chicken pox   . DDD (degenerative disc disease), cervical   . Depression   . Gallstones   . GERD (gastroesophageal reflux disease)   . Hx of melanoma in situ 2002  . Hx: UTI (urinary tract infection)   . Hyperlipidemia   . Hypertension   . Hypothyroidism   . IBS  (irritable bowel syndrome)   . Melanoma ([place])    x2   . Nephrolithiasis   . Osteoarthritis   . PVC's (premature ventricular contractions)   . Status post dilation of esophageal narrowing     Past Surgical History:  Procedure Laterality Date  . ABDOMINAL HYSTERECTOMY  1974  . APPENDECTOMY  1977  . BLADDER SURGERY  1974  . BREAST EXCISIONAL BIOPSY    . CATARACT EXTRACTION Bilateral 2015  . CHOLECYSTECTOMY  1987  . removal of melanoma in situ      Review of systems negative except as noted in HPI / PMHx or noted below:  Review of Systems  Constitutional: Negative.   HENT: Negative.   Eyes: Negative.   Respiratory: Negative.   Cardiovascular: Negative.   Gastrointestinal: Negative.   Genitourinary: Negative.   Musculoskeletal: Negative.   Skin: Negative.   Neurological: Negative.   Endo/Heme/Allergies: Negative.   Psychiatric/Behavioral: Negative.      Objective:   Vitals:   06/20/20 1024  BP: 126/72  Pulse: (!) 57  Resp: 18  Temp: 98 F (36.7 C)  SpO2: 98%          Physical Exam Constitutional:      Appearance: She is not diaphoretic.  HENT:     Head: Normocephalic.     Right Ear: Tympanic membrane, ear canal and external ear normal.     Left Ear: Tympanic membrane, ear canal and external ear normal.     Nose: Nose normal. No mucosal edema or rhinorrhea.     Mouth/Throat:     Pharynx: Uvula midline. No oropharyngeal exudate.  Eyes:     Conjunctiva/sclera: Conjunctivae normal.  Neck:     Thyroid: No thyromegaly.     Trachea: Trachea normal. No tracheal tenderness or tracheal deviation.  Cardiovascular:     Rate and Rhythm: Normal rate and regular rhythm.     Heart sounds: Normal heart sounds, S1 normal and S2 normal. No murmur heard.   Pulmonary:     Effort: No respiratory distress.     Breath sounds: Normal breath sounds. No stridor. No wheezing or rales.  Lymphadenopathy:     Head:     Right side of head: No tonsillar adenopathy.     Left  side of head: No tonsillar adenopathy.     Cervical: No cervical adenopathy.  Skin:    Findings: No erythema or rash.     Nails: There is no clubbing.  Neurological:     Mental Status: She is alert.     Diagnostics:    Spirometry was performed and demonstrated an FEV1 of 1.74 at 75 % of predicted.  The patient had an Asthma Control Test with the following results: ACT Total Score:  25.    Assessment and Plan:   1. Asthma, moderate persistent, well-controlled   2. Other allergic rhinitis   3. LPRD (laryngopharyngeal reflux disease)      1. Continue to Treat inflammation:   A. Trelegy 200 - one inhalation daily   B. OTC Rhinocort one spray each nostril 3-7 times a week  2. Continue to Treat reflux:   A. Famotidine 20 mg - one time per day  3. If needed:   A. nasal saline wash  B. ProAir HFA 2 puffs every 4-6 hours  C. OTC antihistamine - Claritin/Allegra/Zyrtec  D. OTC Mucinex DM 2 tablets twice a day  4. Return to clinic in 6 months or earlier if problem  5. Obtain fall flu vaccine and Covid booster  Amber Brewer appears to be doing quite well on her current plan and we will continue to have her use a triple inhaler consistently and a nasal steroid on occasion and also address her issue with reflux as noted above.  Assuming she does well with this plan I will see her back in this clinic in 6 months or earlier if there is a problem.  Allena Katz, MD Allergy / Immunology [place]

## 2020-06-20 NOTE — Patient Instructions (Signed)
    1. Continue to Treat inflammation:   A. Trelegy 200 - one inhalation daily   B. OTC Rhinocort one spray each nostril 3-7 times a week  2. Continue to Treat reflux:   A. Famotidine 20 mg - one time per day  3. If needed:   A. nasal saline wash  B. ProAir HFA 2 puffs every 4-6 hours  C. OTC antihistamine - Claritin/Allegra/Zyrtec  D. OTC Mucinex DM 2 tablets twice a day  4. Return to clinic in 6 months or earlier if problem  5. Obtain fall flu vaccine and Covid booster

## 2020-06-21 ENCOUNTER — Encounter: Payer: Self-pay | Admitting: Allergy and Immunology

## 2020-07-17 ENCOUNTER — Telehealth: Payer: Self-pay

## 2020-07-17 ENCOUNTER — Telehealth: Payer: Self-pay | Admitting: Internal Medicine

## 2020-07-17 DIAGNOSIS — I10 Essential (primary) hypertension: Secondary | ICD-10-CM

## 2020-07-17 DIAGNOSIS — E785 Hyperlipidemia, unspecified: Secondary | ICD-10-CM

## 2020-07-17 NOTE — Telephone Encounter (Signed)
   *  STAT* If patient is at the pharmacy, call can be transferred to refill team.   1. Which medications need to be refilled? (please list name of each medication and dose if known) carvedilol (COREG) 12.5 MG tablet  2. Which pharmacy/location (including street and city if local pharmacy) is medication to be sent to? [facility] - [place], [place] - [place] [address]  3. Do they need a 30 day or 90 day supply? 90 days

## 2020-07-17 NOTE — Progress Notes (Addendum)
Chronic Care Management Pharmacy Assistant   Name: Sibley Rolison  MRN: [id number] DOB: 04/29/1951  Reason for Encounter: Medication Review  PCP : [name], NP  Allergies:   Allergies  Allergen Reactions   Codeine Phosphate Nausea And Vomiting    REACTION: unspecified   Prednisone Shortness Of Breath   Betadine [Povidone Iodine] Hives    After surgical skin prep   Sulfa Antibiotics Nausea And Vomiting   Iodine Rash    Broke out on skin once years ago--**Betadine Skin Prep**    Medications: Outpatient Encounter Medications as of 07/17/2020  Medication Sig   albuterol (PROAIR HFA) 108 (90 Base) MCG/ACT inhaler Inhale 1-2 puffs into the lungs every 4 (four) hours as needed.   budesonide (RHINOCORT ALLERGY) 32 MCG/ACT nasal spray Place 1 spray into both nostrils daily as needed for allergies.    carvedilol (COREG) 12.5 MG tablet Take 1 tablet (12.5 mg total) by mouth 2 (two) times daily with a meal.   Cholecalciferol (VITAMIN D) 50 MCG (2000 UT) tablet Take 2,000 Units by mouth daily.    citalopram (CELEXA) 10 MG tablet Take 0.5 tablets (5 mg total) by mouth daily. Takes 0.5 tablet of 10 mg   Continuous Blood Gluc Receiver (FREESTYLE LIBRE 14 DAY READER) DEVI 1 Device by Does not apply route every 14 (fourteen) days.   Continuous Blood Gluc Sensor (FREESTYLE LIBRE 14 DAY SENSOR) MISC 1 Device by Does not apply route every 14 (fourteen) days.   cycloSPORINE (RESTASIS) 0.05 % ophthalmic emulsion Place 1 drop into both eyes 2 (two) times daily.    DOTTI 0.05 MG/24HR patch APPLY 1 PATCH TOPICALLY ONTO THE SKIN 2 TIMES A WEEK (Patient taking differently: 0.5 patches. Uses 0.5 patch twice a week)   famotidine (PEPCID) 20 MG tablet Take 1 tablet (20 mg total) by mouth daily.   Flaxseed, Linseed, (FLAX SEED OIL) 1000 MG CAPS Take 1 capsule by mouth daily.    Fluticasone-Umeclidin-Vilant (TRELEGY ELLIPTA) 200-62.5-25 MCG/INH AEPB Inhale 1 puff into the lungs  daily.   hydrochlorothiazide (HYDRODIURIL) 25 MG tablet Take 1 tablet (25 mg total) by mouth daily.   irbesartan (AVAPRO) 150 MG tablet Take 2 tablets (300 mg total) by mouth daily. (Patient taking differently: Take 150 mg by mouth in the morning and at bedtime. )   levothyroxine (SYNTHROID) 50 MCG tablet TAKE 1 TABLET(50 MCG) BY MOUTH DAILY BEFORE BREAKFAST   Multiple Vitamins-Minerals (ALIVE WOMENS GUMMY PO) Take 1 tablet by mouth daily.   multivitamin-lutein (OCUVITE-LUTEIN) CAPS capsule Take 1 capsule by mouth daily.   Omega-3 Fatty Acids (FISH OIL PO) Take 540 mg by mouth daily. Nature Made Mini Omega-3   potassium chloride (KLOR-CON) 10 MEQ tablet Take 1 tablet (10 mEq total) by mouth daily.   rosuvastatin (CRESTOR) 10 MG tablet Take 1 tablet (10 mg total) by mouth daily.   TURMERIC PO Take 500 mg by mouth daily.    Zinc 100 MG TABS Take 1 tablet by mouth daily.   No facility-administered encounter medications on file as of 07/17/2020.    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  No OVs, Consults, or hospital visits since last care  coordination call/Pharmacist visit.   06/20/2020 Allergy and immunology  No medication changes indicated   BP Readings from Last 3 Encounters:  06/20/20 126/72  06/08/20 140/80  03/28/20 122/64    Lab Results  Component Value Date   HGBA1C 5.6 06/08/2020     Patient obtains medications through Adherence Packaging  30 Days   Last adherence delivery included: (medication name and frequency)   Carvedilol 12.5 mg twice daily - Breakfast, Supper              Citalopram 10 mg  tablet daily - Breakfast              Famotidine 20 mg daily - Supper              HCTZ 25 mg daily - Lunch              Irbesartan 150 mg twice daily- Breakfast, supper              Rosuvastatin 10 mg daily - Supper               Potassium Chloride 10 meQ daily - Lunch              Vitamin D3 2000 units daily - supper              Rhinocort Allergy 32 mcg/act              Turmeric 500 mg daily- Lunch              Fish Oil 540 mg daily  (Nature Made Mini Omega)              [place] Adult Multivitamin 50+ 1 capsule daily              Alive Multivitamin 1 gummy daily  Short fill of Synthroid 50 mcg on 07/04/20 to align on sync date   Patient declined (meds) last month due to PRN use/additional supply on hand.  Trelegy- Patient has unopened inhaler remaining, applying for PAP to help with medication costs.        Albuterol HFA- Rarely uses, expires in March 2022.              Estradiol Patches- Patient has adequate supply  Patient is due for next adherence delivery on: 07/19/2020. Called patient and reviewed medications and coordinated delivery.  This delivery to include:   Carvedilol 12.5 mg twice daily - Breakfast, Supper              Citalopram 10 mg  tablet daily - Breakfast              Famotidine 20 mg daily - Supper              HCTZ 25 mg daily - Lunch              Irbesartan 150 mg twice daily- Breakfast, supper              Rosuvastatin 10 mg daily -  Supper               Potassium  Chloride 10 meQ daily - Lunch              Vitamin D3 2000 units daily - supper              Rhinocort Allergy 32 mcg/act -PRN             Turmeric 500 mg daily- Lunch              Fish Oil 540 mg daily  (Nature Made Mini Omega) -[place] Adult Multivitamin 50+ 1 capsule daily -Breakfast   Patient declined the following medications (meds) due to (reason)   Trelegy- Patient has unopened inhaler remaining, applying for PAP to help with medication costs. (Patient states she received samples from her  Allergy and immunology  [facility] on 06/20/2020 of 28 days supply.)       Albuterol HFA- Rarely uses, expires in March 2022.              Estradiol Patches- Patient has adequate supply   Alive Multivitamin 1 gummy daily (OTC)  Zinc 100 MG (not taking)  Synthroid- patient states she has around 30DS on hand as of 07/17/2020  Patient needs refills for Carvedilol 12.5 MG. Request sent to Dr. Debara Pickett office  Confirmed delivery date of 07/19/2020, advised patient that pharmacy will contact them the morning of delivery.  [facility] Pharmacist Assistant [phone]

## 2020-07-18 ENCOUNTER — Other Ambulatory Visit: Payer: Self-pay

## 2020-07-18 MED ORDER — CARVEDILOL 12.5 MG PO TABS
12.5000 mg | ORAL_TABLET | Freq: Two times a day (BID) | ORAL | 1 refills | Status: DC
Start: 2020-07-18 — End: 2021-03-05

## 2020-07-21 ENCOUNTER — Telehealth: Payer: Self-pay | Admitting: Adult Health

## 2020-07-21 NOTE — Telephone Encounter (Signed)
Patient requested I call back the first of November

## 2020-08-02 ENCOUNTER — Telehealth: Payer: Self-pay

## 2020-08-02 NOTE — Progress Notes (Addendum)
Chronic Care Management Pharmacy Assistant   Name: Amber Brewer  MRN: [id number] DOB: Dec 21, 1950  Reason for Encounter: Medication Review  PCP : [name], NP  Allergies:   Allergies  Allergen Reactions  . Codeine Phosphate Nausea And Vomiting    REACTION: unspecified  . Prednisone Shortness Of Breath  . Betadine [Povidone Iodine] Hives    After surgical skin prep  . Sulfa Antibiotics Nausea And Vomiting  . Iodine Rash    Broke out on skin once years ago--**Betadine Skin Prep**    Medications: Outpatient Encounter Medications as of 08/02/2020  Medication Sig  . albuterol (PROAIR HFA) 108 (90 Base) MCG/ACT inhaler Inhale 1-2 puffs into the lungs every 4 (four) hours as needed.  . budesonide (RHINOCORT ALLERGY) 32 MCG/ACT nasal spray Place 1 spray into both nostrils daily as needed for allergies.   . carvedilol (COREG) 12.5 MG tablet Take 1 tablet (12.5 mg total) by mouth 2 (two) times daily with a meal.  . Cholecalciferol (VITAMIN D) 50 MCG (2000 UT) tablet Take 2,000 Units by mouth daily.   . citalopram (CELEXA) 10 MG tablet Take 0.5 tablets (5 mg total) by mouth daily. Takes 0.5 tablet of 10 mg  . Continuous Blood Gluc Receiver (FREESTYLE LIBRE 14 DAY READER) DEVI 1 Device by Does not apply route every 14 (fourteen) days.  . Continuous Blood Gluc Sensor (FREESTYLE LIBRE 14 DAY SENSOR) MISC 1 Device by Does not apply route every 14 (fourteen) days.  . cycloSPORINE (RESTASIS) 0.05 % ophthalmic emulsion Place 1 drop into both eyes 2 (two) times daily.   . DOTTI 0.05 MG/24HR patch APPLY 1 PATCH TOPICALLY ONTO THE SKIN 2 TIMES A WEEK (Patient taking differently: 0.5 patches. Uses 0.5 patch twice a week)  . famotidine (PEPCID) 20 MG tablet Take 1 tablet (20 mg total) by mouth daily.  . Flaxseed, Linseed, (FLAX SEED OIL) 1000 MG CAPS Take 1 capsule by mouth daily.   . Fluticasone-Umeclidin-Vilant (TRELEGY ELLIPTA) 200-62.5-25 MCG/INH AEPB Inhale 1 puff into the lungs  daily.  . hydrochlorothiazide (HYDRODIURIL) 25 MG tablet Take 1 tablet (25 mg total) by mouth daily.  . irbesartan (AVAPRO) 150 MG tablet Take 2 tablets (300 mg total) by mouth daily. (Patient taking differently: Take 150 mg by mouth in the morning and at bedtime. )  . levothyroxine (SYNTHROID) 50 MCG tablet TAKE 1 TABLET(50 MCG) BY MOUTH DAILY BEFORE BREAKFAST  . Multiple Vitamins-Minerals (ALIVE WOMENS GUMMY PO) Take 1 tablet by mouth daily.  . multivitamin-lutein (OCUVITE-LUTEIN) CAPS capsule Take 1 capsule by mouth daily.  . Omega-3 Fatty Acids (FISH OIL PO) Take 540 mg by mouth daily. Nature Made Mini Omega-3  . potassium chloride (KLOR-CON) 10 MEQ tablet Take 1 tablet (10 mEq total) by mouth daily.  . rosuvastatin (CRESTOR) 10 MG tablet Take 1 tablet (10 mg total) by mouth daily.  . TURMERIC PO Take 500 mg by mouth daily.   . Zinc 100 MG TABS Take 1 tablet by mouth daily.   No facility-administered encounter medications on file as of 08/02/2020.    Current Diagnosis: Patient Active Problem List   Diagnosis Date Noted  . Myofascial pain syndrome, cervical 06/03/2018  . PVC's (premature ventricular contractions) 10/31/2016  . Benign hypertension 10/31/2016  . Anxiety 10/31/2016  . Hyperlipidemia 05/27/2016  . LEG PAIN, BILATERAL 06/07/2010  . LEG EDEMA, BILATERAL 10/04/2009  . HYPOKALEMIA 07/20/2009  . ALLERGY, HX OF 11/24/2008  . Anxiety disorder 05/19/2008  . 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  Pain in the chest 05/19/2008  . 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 HYPOTHYROIDISM, BORDERLINE 12/16/2007  . PANIC ATTACK 06/04/2007  . DISORDER, 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  . Essential hypertension 05/25/2007  . OSTEOARTHRITIS 05/25/2007  . NEPHROLITHIASIS, HX OF 05/25/2007  . ARTHRITIS, HX OF 05/25/2007    Follow-Up:  Pharmacist Review   Reviewed chart for medication changes ahead of medication coordination call.  No OVs, Consults, or hospital visits since last care  coordination call/Pharmacist visit.  No medication changes indicated   BP Readings from Last 3 Encounters:  06/20/20 126/72  06/08/20 140/80  03/28/20 122/64    Lab Results  Component Value Date   HGBA1C 5.6 06/08/2020     Patient obtains medications through Adherence Packaging  30 Days   Last adherence delivery included: (medication name and frequency)     Carvedilol 12.5 mg twice daily -Breakfast, Supper  Citalopram 10 mg  tablet daily -Breakfast  Famotidine 20 mg daily -Supper  HCTZ 25 mg daily -Lunch  Irbesartan 150 mg twice daily-Breakfast, supper  Rosuvastatin 10 mg daily -Supper  Potassium Chloride 10 meQ daily -Lunch  Vitamin D3 2000 units daily -supper  Rhinocort Allergy 32 mcg/act -PRN Turmeric 500 mg daily- Lunch  Fish Oil 540 mg daily (Nature Made Mini Omega) -[place] Adult Multivitamin 50+ 1 capsule daily -Breakfast  Patient declined (meds) last month due to PRN use/additional supply on hand.   Trelegy- Patient has unopened inhaler remaining, applying for PAP to help with medication costs. (Patient states she received samples from her  Allergy and immunology  [facility] on 06/20/2020 of 28 days supply.) Albuterol HFA- Rarely uses, expires in March 2022.  Estradiol Patches- Patient has adequate supply             Alive Multivitamin 1 gummy daily (OTC)             Zinc 100 MG (not taking)             Synthroid- patient states she has around 30DS on hand as of 07/17/2020  Patient is due for next adherence delivery on: 08/16/2020. Called patient and reviewed medications and coordinated delivery.  This delivery to include:    Carvedilol 12.5 mg twice daily -Breakfast, Supper  Citalopram 10 mg  tablet daily -Breakfast  Famotidine 20 mg daily -Supper   HCTZ 25 mg daily -Lunch  Irbesartan 150 mg twice daily-Breakfast, supper  Rosuvastatin 10 mg daily -Supper  Potassium Chloride 10 meQ daily -Lunch  Vitamin D3 2000 units daily -supper  Rhinocort Allergy 32 mcg/act -PRN Turmeric 500 mg daily- Lunch  Fish Oil 540 mg daily (Nature Made Mini Omega) -[place] Adult Multivitamin 50+ 1 capsule daily -Breakfast  Synthroid-50 MCG tablet Daily - before breakfast  Trelegy 200-62.5-25 MCG/INH  Patient declined the following medications (meds) due to (reason)  Albuterol HFA- Rarely uses, expires in March 2022.  Estradiol Patches- Patient has adequate supply             Alive Multivitamin 1 gummy daily (OTC)             Zinc 100 MG (not taking)  Patient needs refills forRhinocort Allergy 32 mcg/act  Confirmed delivery date of 08/16/2020, advised patient that pharmacy will contact them the morning of delivery.  [place] Pharmacist Assistant [phone]

## 2020-08-07 ENCOUNTER — Telehealth: Payer: Self-pay

## 2020-08-07 NOTE — Telephone Encounter (Signed)
-----   Message from Germaine Pomfret, [facility] sent at 08/04/2020 11:16 AM EDT ----- Regarding: Medication Request Amber Brewer,  Patient is requesting refill of her Nasocort Nasal Spray. Would you be able to put in a refill of this for her?  Thanks, Doristine Section Clinical Pharmacist [facility]

## 2020-08-08 MED ORDER — TRIAMCINOLONE ACETONIDE 55 MCG/ACT NA AERO: 1.0000 | INHALATION_SPRAY | Freq: Every day | NASAL | 12 refills | Status: DC

## 2020-08-08 NOTE — Telephone Encounter (Signed)
Ok to refill 

## 2020-08-08 NOTE — Telephone Encounter (Signed)
Rx refill sent in.

## 2020-08-08 NOTE — Addendum Note (Signed)
Addended by: Nathanial Millman E on: 08/08/2020 07:09 AM   Modules accepted: Orders

## 2020-08-31 ENCOUNTER — Other Ambulatory Visit: Payer: Self-pay | Admitting: *Deleted

## 2020-08-31 ENCOUNTER — Other Ambulatory Visit: Payer: Self-pay

## 2020-08-31 DIAGNOSIS — F411 Generalized anxiety disorder: Secondary | ICD-10-CM

## 2020-08-31 MED ORDER — CITALOPRAM HYDROBROMIDE 10 MG PO TABS: 5.0000 mg | ORAL_TABLET | Freq: Every day | ORAL | 0 refills | Status: DC

## 2020-08-31 MED ORDER — FAMOTIDINE 20 MG PO TABS
20.0000 mg | ORAL_TABLET | Freq: Every day | ORAL | 3 refills | Status: DC
Start: 2020-08-31 — End: 2020-12-19

## 2020-08-31 NOTE — Telephone Encounter (Signed)
Rx done. 

## 2020-09-08 ENCOUNTER — Telehealth: Payer: Self-pay | Admitting: Pharmacist

## 2020-09-08 ENCOUNTER — Ambulatory Visit: Payer: PPO | Admitting: Adult Health

## 2020-09-08 NOTE — Chronic Care Management (AMB) (Signed)
Chronic Care Management Pharmacy Assistant   Name: Amber Brewer  MRN: [id number] DOB: [birth year]  Reason for Encounter: Medication Review  Patient Questions:  1.  Have you seen any other providers since your last visit? No  2.  Any changes in your medicines or health? No  PCP : [name], NP  Allergies:   Allergies  Allergen Reactions  . Codeine Phosphate Nausea And Vomiting    REACTION: unspecified  . Prednisone Shortness Of Breath  . Betadine [Povidone Iodine] Hives    After surgical skin prep  . Sulfa Antibiotics Nausea And Vomiting  . Iodine Rash    Broke out on skin once years ago--**Betadine Skin Prep**    Medications: Outpatient Encounter Medications as of 09/08/2020  Medication Sig  . albuterol (PROAIR HFA) 108 (90 Base) MCG/ACT inhaler Inhale 1-2 puffs into the lungs every 4 (four) hours as needed.  . budesonide (RHINOCORT ALLERGY) 32 MCG/ACT nasal spray Place 1 spray into both nostrils daily as needed for allergies.   . carvedilol (COREG) 12.5 MG tablet Take 1 tablet (12.5 mg total) by mouth 2 (two) times daily with a meal.  . Cholecalciferol (VITAMIN D) 50 MCG (2000 UT) tablet Take 2,000 Units by mouth daily.   . citalopram (CELEXA) 10 MG tablet Take 0.5 tablets (5 mg total) by mouth daily. Takes 0.5 tablet of 10 mg  . Continuous Blood Gluc Receiver (FREESTYLE LIBRE 14 DAY READER) DEVI 1 Device by Does not apply route every 14 (fourteen) days.  . Continuous Blood Gluc Sensor (FREESTYLE LIBRE 14 DAY SENSOR) MISC 1 Device by Does not apply route every 14 (fourteen) days.  . cycloSPORINE (RESTASIS) 0.05 % ophthalmic emulsion Place 1 drop into both eyes 2 (two) times daily.   . DOTTI 0.05 MG/24HR patch APPLY 1 PATCH TOPICALLY ONTO THE SKIN 2 TIMES A WEEK (Patient taking differently: 0.5 patches. Uses 0.5 patch twice a week)  . famotidine (PEPCID) 20 MG tablet Take 1 tablet (20 mg total) by mouth daily.  . Flaxseed, Linseed, (FLAX SEED OIL) 1000 MG CAPS  Take 1 capsule by mouth daily.   . Fluticasone-Umeclidin-Vilant (TRELEGY ELLIPTA) 200-62.5-25 MCG/INH AEPB Inhale 1 puff into the lungs daily.  . hydrochlorothiazide (HYDRODIURIL) 25 MG tablet Take 1 tablet (25 mg total) by mouth daily.  . irbesartan (AVAPRO) 150 MG tablet Take 2 tablets (300 mg total) by mouth daily. (Patient taking differently: Take 150 mg by mouth in the morning and at bedtime. )  . levothyroxine (SYNTHROID) 50 MCG tablet TAKE 1 TABLET(50 MCG) BY MOUTH DAILY BEFORE BREAKFAST  . Multiple Vitamins-Minerals (ALIVE WOMENS GUMMY PO) Take 1 tablet by mouth daily.  . multivitamin-lutein (OCUVITE-LUTEIN) CAPS capsule Take 1 capsule by mouth daily.  . Omega-3 Fatty Acids (FISH OIL PO) Take 540 mg by mouth daily. Nature Made Mini Omega-3  . potassium chloride (KLOR-CON) 10 MEQ tablet Take 1 tablet (10 mEq total) by mouth daily.  . rosuvastatin (CRESTOR) 10 MG tablet Take 1 tablet (10 mg total) by mouth daily.  Marland Kitchen triamcinolone (NASACORT) 55 MCG/ACT AERO nasal inhaler Place 1 spray into the nose daily.  . TURMERIC PO Take 500 mg by mouth daily.   . Zinc 100 MG TABS Take 1 tablet by mouth daily.   No facility-administered encounter medications on file as of 09/08/2020.    Current Diagnosis: Patient Active Problem List   Diagnosis Date Noted  . Myofascial pain syndrome, cervical 06/03/2018  . PVC's (premature ventricular contractions) 10/31/2016  . Benign  hypertension 10/31/2016  . Anxiety 10/31/2016  . Hyperlipidemia 05/27/2016  . LEG PAIN, BILATERAL 06/07/2010  . LEG EDEMA, BILATERAL 10/04/2009  . HYPOKALEMIA 07/20/2009  . ALLERGY, HX OF 11/24/2008  . Anxiety disorder 05/19/2008  . 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 Pain in the chest 05/19/2008  . 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 HYPOTHYROIDISM, BORDERLINE 12/16/2007  . PANIC ATTACK 06/04/2007  . DISORDER, 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  . Essential hypertension 05/25/2007  . OSTEOARTHRITIS 05/25/2007  .  NEPHROLITHIASIS, HX OF 05/25/2007  . ARTHRITIS, HX OF 05/25/2007    Goals Addressed   None    Reviewed chart for medication changes ahead of medication coordination call. No OVs, Consults, or hospital visits since last care coordination call/Pharmacist visit.  No medication changes indicated OR if recent visit, treatment plan here.  BP Readings from Last 3 Encounters:  06/20/20 126/72  06/08/20 140/80  03/28/20 122/64    Lab Results  Component Value Date   HGBA1C 5.6 06/08/2020     Patient obtains medications through Adherence Packaging  30 Days   Last adherence delivery included:  . Carvedilol 12.5 mg: One tablet twice daily at breakfast and dinner . Citalopram 10 mg:  tablet daily at breakfast   . Famotidine 20 mg: One tablet daily at dinner  . HCTZ 25 mg: One tablet daily at lunch  . Irbesartan 150 mg: One tablet twice daily at breakfast and dinner . Rosuvastatin 10 mg: one tablet daily at dinner . Potassium Chloride 10 MEQ: One tablet daily at lunch . Vitamin D3 2000 units daily: one tablet daily at dinner . Rhinocort Allergy 32 mcg/act -PRN . Turmeric 500 mg: One tablet daily at dinner . Fish Oil 540 mg daily (Nature Made Mini Omega) daily at lunch . [place] Adult Multivitamin 50+ 1 capsule daily -Breakfast  Patient is due for next adherence delivery on: 09-18-2020. Called patient and reviewed medications and coordinated delivery.  This delivery to include: . Carvedilol 12.5 mg: One tablet twice daily at breakfast and dinner . Citalopram 10 mg:  tablet daily at breakfast   . Famotidine 20 mg: One tablet daily at dinner  . Irbesartan 150 mg: One tablet twice daily at breakfast and dinner . Rosuvastatin 10 mg: one tablet daily at dinner . Potassium Chloride 10 MEQ: One tablet daily at lunch . Vitamin D3 2000 units daily: one tablet daily at dinner . Turmeric 500 mg: One tablet daily at dinner . Fish Oil 540 mg daily (Nature Made Mini Omega) daily at  lunch . [place] Adult Multivitamin 50+ 1 capsule daily -Breakfast . Levothyroxine 50 MCG: one tablet daily . Hydrochlorothiazide 25 MG tablet: One tablet daily . Freestyle Libre 14 day [employer] . Trelegy Ellipta  200-62.5-25  The patient is taking the following medications and declined dur to supply on hand and PRN . Rhinocort Allergy 32 mcg/act -PRN . Albuterol (PROAIR HFA) 108 (90 Base) MCG/ACT inhaler . DOTTI 0.05 MG/24HR patch  Patient needs refills for : . Levothyroxine 50 MCG tablet  . Hydrochlorothiazide 25 MG tablet . Rosuvastatin 10 mg tablet  Confirmed delivery date of 09-18-2020, advised patient that pharmacy will contact them the morning of delivery.  Follow-Up:  Coordination of Enhanced Pharmacy Services and Pharmacist Review   [name], [place] Assistant [phone]

## 2020-09-12 ENCOUNTER — Other Ambulatory Visit: Payer: Self-pay | Admitting: Adult Health

## 2020-09-12 MED ORDER — ROSUVASTATIN CALCIUM 10 MG PO TABS: 10.0000 mg | ORAL_TABLET | Freq: Every day | ORAL | 2 refills | Status: DC

## 2020-09-12 MED ORDER — LEVOTHYROXINE SODIUM 50 MCG PO TABS: ORAL_TABLET | ORAL | 2 refills | Status: DC

## 2020-09-12 MED ORDER — HYDROCHLOROTHIAZIDE 25 MG PO TABS: 25.0000 mg | ORAL_TABLET | Freq: Every day | ORAL | 2 refills | Status: DC

## 2020-09-18 ENCOUNTER — Other Ambulatory Visit: Payer: Self-pay | Admitting: Adult Health

## 2020-09-18 DIAGNOSIS — Z1231 Encounter for screening mammogram for malignant neoplasm of breast: Secondary | ICD-10-CM

## 2020-09-28 ENCOUNTER — Encounter: Payer: Self-pay | Admitting: Adult Health

## 2020-09-28 ENCOUNTER — Ambulatory Visit (INDEPENDENT_AMBULATORY_CARE_PROVIDER_SITE_OTHER): Payer: PPO | Admitting: Adult Health

## 2020-09-28 ENCOUNTER — Other Ambulatory Visit: Payer: Self-pay

## 2020-09-28 VITALS — BP 128/76 | HR 58 | Temp 97.8°F | Ht 64.0 in | Wt 184.4 lb

## 2020-09-28 DIAGNOSIS — E785 Hyperlipidemia, unspecified: Secondary | ICD-10-CM

## 2020-09-28 DIAGNOSIS — R7303 Prediabetes: Secondary | ICD-10-CM | POA: Diagnosis not present

## 2020-09-28 DIAGNOSIS — E039 Hypothyroidism, unspecified: Secondary | ICD-10-CM

## 2020-09-28 DIAGNOSIS — L659 Nonscarring hair loss, unspecified: Secondary | ICD-10-CM

## 2020-09-28 LAB — POCT GLYCOSYLATED HEMOGLOBIN (HGB A1C): Hemoglobin A1C: 5.7 % — AB (ref 4.0–5.6)

## 2020-09-28 NOTE — Progress Notes (Signed)
Subjective:    Patient ID: Amber Brewer, female    DOB: [birth year], 69 y.o.   MRN: [id number]  HPI  69 year old female who  has a past medical history of Asthma, Chicken pox, DDD (degenerative disc disease), cervical, Depression, Gallstones, GERD (gastroesophageal reflux disease), melanoma in situ (2002), UTI (urinary tract infection), Hyperlipidemia, Hypertension, Hypothyroidism, IBS (irritable bowel syndrome), Melanoma ([place]), Nephrolithiasis, Osteoarthritis, PVC's (premature ventricular contractions), and Status post dilation of esophageal narrowing.  Presents to the office today for  follow-up regarding hyperlipidemia and glucose intolerance .  In August 2021 she was started on Crestor for elevated lipid panel despite lifestyle modifications.  Since starting Crestor she denies myalgia or fatigue but feel as though since starting Crestor her hair has started falling out.   For elevated glucose levels, she has been monitoring her blood sugar with the libre system reports readings within normal limits.  Has significantly changed her lifestyle and eating habits since being an A1c reading of 6.5 approximately 7 months ago.  She is taking Synthroid 50 mcg daily for hypothyroidism Lab Results  Component Value Date   TSH 4.73 (H) 03/02/2020     Lab Results  Component Value Date   HGBA1C 5.7 (A) 09/28/2020     Lab Results  Component Value Date   CHOL 251 (H) 06/08/2020   HDL 62 06/08/2020   LDLCALC 171 (H) 06/08/2020   LDLDIRECT 143.5 01/15/2011   TRIG 79 06/08/2020   CHOLHDL 4.0 06/08/2020    Review of Systems See HPI   Past Medical History:  Diagnosis Date  . Asthma   . Chicken pox   . DDD (degenerative disc disease), cervical   . Depression   . Gallstones   . GERD (gastroesophageal reflux disease)   . Hx of melanoma in situ 2002  . Hx: UTI (urinary tract infection)   . Hyperlipidemia   . Hypertension   . Hypothyroidism   . IBS (irritable bowel syndrome)   .  Melanoma ([place])    x2   . Nephrolithiasis   . Osteoarthritis   . PVC's (premature ventricular contractions)   . Status post dilation of esophageal narrowing     Social History   Socioeconomic History  . Marital status: Married    Spouse name: Not on file  . Number of children: 2  . Years of education: 68  . Highest education level: Not on file  Occupational History  . Occupation: licensed Engineer, site: OTHER    Comment: Office manager  Tobacco Use  . Smoking status: Never Smoker  . Smokeless tobacco: Never Used  Vaping Use  . Vaping Use: Never used  Substance and Sexual Activity  . Alcohol use: No  . Drug use: No  . Sexual activity: Yes    Birth control/protection: Post-menopausal, Surgical  Other Topics Concern  . Not on file  Social History Narrative   epworth sleepiness scale = 8 (01/18/2016)   Lives at home with her husband and disabled son   Right handed   Caffeine: decaf   Social Determinants of Health   Financial Resource Strain: High Risk  . Difficulty of Paying Living Expenses: Very hard  Food Insecurity: Not on file  Transportation Needs: No Transportation Needs  . Lack of Transportation (Medical): No  . Lack of Transportation (Non-Medical): No  Physical Activity: Insufficiently Active  . Days of Exercise per Week: 3 days  . Minutes of Exercise per Session: 30 min  Stress: Stress Concern  Present  . Feeling of Stress : To some extent  Social Connections: Not on file  Intimate Partner Violence: Not on file    Past Surgical History:  Procedure Laterality Date  . ABDOMINAL HYSTERECTOMY  1974  . APPENDECTOMY  1977  . BLADDER SURGERY  1974  . BREAST EXCISIONAL BIOPSY    . CATARACT EXTRACTION Bilateral 2015  . CHOLECYSTECTOMY  1987  . removal of melanoma in situ      Family History  Problem Relation Age of Onset  . Hypertension Mother   . Thyroid disease Mother   . Arthritis Mother   . Depression Mother   . Hearing loss Mother    . Miscarriages / [place] Mother   . Heart attack Father   . Heart disease Father   . Alcohol abuse Father   . Arthritis Father   . Depression Father   . Drug abuse Father   . Hypertension Father   . Alcohol abuse Sister   . Arthritis Sister   . Depression Sister   . Hearing loss Sister   . Hypotension Sister   . Hypertension Maternal Aunt   . Hypertension Maternal Uncle   . Clotting disorder Maternal Uncle   . Prostate cancer Maternal Uncle   . Colon cancer Maternal Uncle   . Colon polyps Maternal Uncle   . Diabetes Maternal Uncle        all uncles  . Arthritis Maternal Grandmother   . Asthma Maternal Grandmother   . Breast cancer Maternal Grandmother   . Stomach cancer Maternal Grandmother   . Hearing loss Maternal Grandmother   . Diabetes Maternal Grandfather   . Heart disease Maternal Grandfather   . Arthritis Maternal Grandfather   . Hearing loss Maternal Grandfather   . Hypertension Maternal Grandfather   . Kidney disease Maternal Grandfather   . Hyperlipidemia Paternal Grandmother        also MI  . Breast cancer Paternal Grandmother   . Arthritis Paternal Grandmother   . Heart attack Paternal Grandmother   . Hyperlipidemia Paternal Grandfather        also MI  . Heart disease Paternal Grandfather   . Heart attack Paternal Grandfather   . Depression Sister   . Heart disease Sister   . Hyperlipidemia Sister   . Hypertension Son   . Endometriosis Daughter     Allergies  Allergen Reactions  . Codeine Phosphate Nausea And Vomiting    REACTION: unspecified  . Prednisone Shortness Of Breath  . Betadine [Povidone Iodine] Hives    After surgical skin prep  . Sulfa Antibiotics Nausea And Vomiting  . Iodine Rash    Broke out on skin once years ago--**Betadine Skin Prep**    Current Outpatient Medications on File Prior to Visit  Medication Sig Dispense Refill  . albuterol (PROAIR HFA) 108 (90 Base) MCG/ACT inhaler Inhale 1-2 puffs into the lungs every 4  (four) hours as needed. 18 g 1  . budesonide (RHINOCORT AQUA) 32 MCG/ACT nasal spray Place 1 spray into both nostrils daily as needed for allergies.     . carvedilol (COREG) 12.5 MG tablet Take 1 tablet (12.5 mg total) by mouth 2 (two) times daily with a meal. 180 tablet 1  . Cholecalciferol (VITAMIN D) 50 MCG (2000 UT) tablet Take 2,000 Units by mouth daily.     . citalopram (CELEXA) 10 MG tablet Take 0.5 tablets (5 mg total) by mouth daily. Takes 0.5 tablet of 10 mg 45 tablet 0  .  Continuous Blood Gluc Receiver (FREESTYLE LIBRE 14 DAY READER) DEVI 1 Device by Does not apply route every 14 (fourteen) days. 6 each 3  . Continuous Blood Gluc Sensor (FREESTYLE LIBRE 14 DAY SENSOR) MISC 1 Device by Does not apply route every 14 (fourteen) days. 6 each 3  . cycloSPORINE (RESTASIS) 0.05 % ophthalmic emulsion Place 1 drop into both eyes 2 (two) times daily.    . DOTTI 0.05 MG/24HR patch APPLY 1 PATCH TOPICALLY ONTO THE SKIN 2 TIMES A WEEK (Patient taking differently: 0.5 patches. Uses 0.5 patch twice a week) 24 patch 2  . famotidine (PEPCID) 20 MG tablet Take 1 tablet (20 mg total) by mouth daily. 30 tablet 3  . Flaxseed, Linseed, (FLAX SEED OIL) 1000 MG CAPS Take 1 capsule by mouth daily.    . Fluticasone-Umeclidin-Vilant (TRELEGY ELLIPTA) 200-62.5-25 MCG/INH AEPB Inhale 1 puff into the lungs daily. 60 each 5  . hydrochlorothiazide (HYDRODIURIL) 25 MG tablet Take 1 tablet (25 mg total) by mouth daily. 90 tablet 2  . irbesartan (AVAPRO) 150 MG tablet Take 2 tablets (300 mg total) by mouth daily. (Patient taking differently: Take 150 mg by mouth in the morning and at bedtime.) 180 tablet 3  . levothyroxine (SYNTHROID) 50 MCG tablet TAKE 1 TABLET(50 MCG) BY MOUTH DAILY BEFORE BREAKFAST 90 tablet 2  . Multiple Vitamins-Minerals (ALIVE WOMENS GUMMY PO) Take 1 tablet by mouth daily.    . multivitamin-lutein (OCUVITE-LUTEIN) CAPS capsule Take 1 capsule by mouth daily.    . Omega-3 Fatty Acids (FISH OIL PO) Take  540 mg by mouth daily. Nature Made Mini Omega-3    . potassium chloride (KLOR-CON) 10 MEQ tablet Take 1 tablet (10 mEq total) by mouth daily. 90 tablet 3  . rosuvastatin (CRESTOR) 10 MG tablet Take 1 tablet (10 mg total) by mouth daily. 90 tablet 2  . TURMERIC PO Take 500 mg by mouth daily.     . Zinc 100 MG TABS Take 1 tablet by mouth daily.    Marland Kitchen triamcinolone (NASACORT) 55 MCG/ACT AERO nasal inhaler Place 1 spray into the nose daily. (Patient not taking: Reported on 09/28/2020) 1 each 12   No current facility-administered medications on file prior to visit.    BP 128/76   Pulse (!) 58   Temp 97.8 F (36.6 C) (Oral)   Ht 5\' 4"  (1.626 m)   Wt 184 lb 6.4 oz (83.6 kg)   SpO2 99%   BMI 31.65 kg/m       Objective:   Physical Exam Vitals and nursing note reviewed.  Constitutional:      Appearance: Normal appearance.  HENT:     Head:     Comments: Alopecia present  Musculoskeletal:        General: Normal range of motion.  Skin:    General: Skin is warm and dry.     Capillary Refill: Capillary refill takes less than 2 seconds.  Neurological:     General: No focal deficit present.     Mental Status: She is alert and oriented to person, place, and time.  Psychiatric:        Mood and Affect: Mood normal.        Behavior: Behavior normal.        Thought Content: Thought content normal.        Judgment: Judgment normal.       Assessment & Plan:  1. Pre-diabetes  - POC HgB A1c- 5.5  - Well controlled - Go back to  yearly testing   2. Hyperlipidemia, unspecified hyperlipidemia type  - Lipid panel; Future - Lipid panel  3. Hypothyroidism, unspecified type  - TSH; Future - TSH  4. Hair loss -Not usually associated with Crestor.  Will check TSH levels.  We may have her take a statin vacation. - TSH; Future - TSH  [name], NP

## 2020-09-29 ENCOUNTER — Telehealth: Payer: Self-pay | Admitting: Adult Health

## 2020-09-29 LAB — LIPID PANEL
Cholesterol: 154 mg/dL (ref <200)
HDL: 67 mg/dL (ref >=50)
LDL Cholesterol (Calc): 73 mg/dL (calc)
Non-HDL Cholesterol (Calc): 87 mg/dL (calc) (ref <130)
Total CHOL/HDL Ratio: 2.3 (calc) (ref <5.0)
Triglycerides: 56 mg/dL (ref <150)

## 2020-09-29 LAB — TSH: TSH: 2.79 mIU/L (ref 0.40–4.50)

## 2020-09-29 NOTE — Telephone Encounter (Signed)
Spoke to patient informed her of her labs.  Thyroid level is normal and cholesterol panel has improved significantly.  May have her try taking her statin medication weekly, in the off chance that Crestor is causing hair loss

## 2020-10-04 ENCOUNTER — Telehealth: Payer: Self-pay | Admitting: Pharmacist

## 2020-10-05 NOTE — Chronic Care Management (AMB) (Signed)
Chronic Care Management Pharmacy Assistant   Name: Amber Brewer  MRN: [id number] DOB: 19-Sep-1951  Reason for Encounter: Medication Review  PCP : [name], NP  Allergies:   Allergies  Allergen Reactions  . Codeine Phosphate Nausea And Vomiting    REACTION: unspecified  . Prednisone Shortness Of Breath  . Betadine [Povidone Iodine] Hives    After surgical skin prep  . Sulfa Antibiotics Nausea And Vomiting  . Iodine Rash    Broke out on skin once years ago--**Betadine Skin Prep**    Medications: Outpatient Encounter Medications as of 10/04/2020  Medication Sig  . albuterol (PROAIR HFA) 108 (90 Base) MCG/ACT inhaler Inhale 1-2 puffs into the lungs every 4 (four) hours as needed.  . budesonide (RHINOCORT AQUA) 32 MCG/ACT nasal spray Place 1 spray into both nostrils daily as needed for allergies.   . carvedilol (COREG) 12.5 MG tablet Take 1 tablet (12.5 mg total) by mouth 2 (two) times daily with a meal.  . Cholecalciferol (VITAMIN D) 50 MCG (2000 UT) tablet Take 2,000 Units by mouth daily.   . citalopram (CELEXA) 10 MG tablet Take 0.5 tablets (5 mg total) by mouth daily. Takes 0.5 tablet of 10 mg  . Continuous Blood Gluc Receiver (FREESTYLE LIBRE 14 DAY READER) DEVI 1 Device by Does not apply route every 14 (fourteen) days.  . Continuous Blood Gluc Sensor (FREESTYLE LIBRE 14 DAY SENSOR) MISC 1 Device by Does not apply route every 14 (fourteen) days.  . cycloSPORINE (RESTASIS) 0.05 % ophthalmic emulsion Place 1 drop into both eyes 2 (two) times daily.  . DOTTI 0.05 MG/24HR patch APPLY 1 PATCH TOPICALLY ONTO THE SKIN 2 TIMES A WEEK (Patient taking differently: 0.5 patches. Uses 0.5 patch twice a week)  . famotidine (PEPCID) 20 MG tablet Take 1 tablet (20 mg total) by mouth daily.  . Flaxseed, Linseed, (FLAX SEED OIL) 1000 MG CAPS Take 1 capsule by mouth daily.  . Fluticasone-Umeclidin-Vilant (TRELEGY ELLIPTA) 200-62.5-25 MCG/INH AEPB Inhale 1 puff into the lungs daily.   . hydrochlorothiazide (HYDRODIURIL) 25 MG tablet Take 1 tablet (25 mg total) by mouth daily.  . irbesartan (AVAPRO) 150 MG tablet Take 2 tablets (300 mg total) by mouth daily. (Patient taking differently: Take 150 mg by mouth in the morning and at bedtime.)  . levothyroxine (SYNTHROID) 50 MCG tablet TAKE 1 TABLET(50 MCG) BY MOUTH DAILY BEFORE BREAKFAST  . Multiple Vitamins-Minerals (ALIVE WOMENS GUMMY PO) Take 1 tablet by mouth daily.  . multivitamin-lutein (OCUVITE-LUTEIN) CAPS capsule Take 1 capsule by mouth daily.  . Omega-3 Fatty Acids (FISH OIL PO) Take 540 mg by mouth daily. Nature Made Mini Omega-3  . potassium chloride (KLOR-CON) 10 MEQ tablet Take 1 tablet (10 mEq total) by mouth daily.  . rosuvastatin (CRESTOR) 10 MG tablet Take 1 tablet (10 mg total) by mouth daily. (Patient taking differently: Take 10 mg by mouth once a week.)  . triamcinolone (NASACORT) 55 MCG/ACT AERO nasal inhaler Place 1 spray into the nose daily. (Patient not taking: Reported on 09/28/2020)  . TURMERIC PO Take 500 mg by mouth daily.   . Zinc 100 MG TABS Take 1 tablet by mouth daily.   No facility-administered encounter medications on file as of 10/04/2020.    Current Diagnosis: Patient Active Problem List   Diagnosis Date Noted  . Myofascial pain syndrome, cervical 06/03/2018  . PVC's (premature ventricular contractions) 10/31/2016  . Benign hypertension 10/31/2016  . Anxiety 10/31/2016  . Hyperlipidemia 05/27/2016  . LEG PAIN,  BILATERAL 06/07/2010  . LEG EDEMA, BILATERAL 10/04/2009  . HYPOKALEMIA 07/20/2009  . ALLERGY, HX OF 11/24/2008  . Anxiety disorder 05/19/2008  . 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 Pain in the chest 05/19/2008  . 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 HYPOTHYROIDISM, BORDERLINE 12/16/2007  . PANIC ATTACK 06/04/2007  . DISORDER, 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  . Essential hypertension 05/25/2007  . OSTEOARTHRITIS 05/25/2007  . NEPHROLITHIASIS, HX OF 05/25/2007  . ARTHRITIS,  HX OF 05/25/2007    Goals Addressed   None    Reviewed chart for medication changes ahead of medication coordination call. Reviewed  OVs, Consults, or hospital visits since last care coordination call/Pharmacist visit. o Office Visit- 09-28-2020 [name], NP Family Medicine Medication changes indicated: Marland Kitchen Rosuvastatin (CRESTOR) 10 MG tablet: decreased from daily to once a week . Discontinued: Metformin 500 mg tablets  BP Readings from Last 3 Encounters:  09/28/20 128/76  06/20/20 126/72  06/08/20 140/80    Lab Results  Component Value Date   HGBA1C 5.7 (A) 09/28/2020     Patient obtains medications through Adherence Packaging  90 Days   Last adherence delivery included:   Carvedilol 12.5 mg: One tablet twice daily at breakfast and dinner  Citalopram 10 mg:  tablet daily at breakfast    Famotidine 20 mg: One tablet daily at dinner   Irbesartan 150 mg: One tablet twice daily at breakfast and dinner  Rosuvastatin 10 mg: one tablet daily at dinner  Potassium Chloride 10 MEQ: One tablet daily at lunch  Vitamin D3 2000 units daily: one tablet daily at dinner  Turmeric 500 mg: One tablet daily at dinner  Fish Oil 540 mg daily (Nature Made Mini Omega)daily at lunch  [facility] Adult Multivitamin 50+ 1 capsule daily-Breakfast  Levothyroxine 50 MCG: one tablet daily  Hydrochlorothiazide 25 MG tablet: One tablet daily  Freestyle Libre 14 day Sensor kit  Trelegy Ellipta  200-62.5-25  Patient is due for next adherence delivery on: 10-11-2020 I called the patient and reviewed medications and coordinated delivery. This delivery to include:  Freestyle Libre 14 day Sensor kit  Trelegy Ellipta  200-62.5-25  I spoke with the patient and review medications. She declined the following medications due to adequate supply. Her last adherence delivery change from 30 days to 90 days supply. The patient is taking the following medications: . Rhinocort Allergy 32  mcg/act -PRN . Albuterol (PROAIR HFA) 108 (90 Base) MCG/ACT inhaler . DOTTI 0.05 MG/24 HR patch . Carvedilol 12.5 mg: One tablet twice daily at breakfast and dinner . Citalopram 10 mg:  tablet daily at breakfast   . Famotidine 20 mg: One tablet daily at dinner  . Irbesartan 150 mg: One tablet twice daily at breakfast and dinner . Rosuvastatin 10 mg: One tablet every Monday night . Potassium Chloride 10 MEQ: One tablet daily at lunch . Vitamin D3 2000 units daily: one tablet daily at dinner . Turmeric 500 mg: One tablet daily at dinner . Fish Oil 540 mg daily (Nature Made Mini Omega) daily at lunch . [place] Adult Multivitamin 50+ 1 capsule daily -Breakfast . Levothyroxine 50 MCG: one tablet daily . Hydrochlorothiazide 25 MG tablet: one tablet at lunch  She currently does not need refills. Confirmed delivery date of 10-11-2020, advised patient that pharmacy will contact them the morning of delivery.  Follow-Up:  Coordination of Enhanced Pharmacy Services   Amilia ([place]) Mare Ferrari, [place] Assistant [phone]

## 2020-10-19 DIAGNOSIS — L719 Rosacea, unspecified: Secondary | ICD-10-CM | POA: Diagnosis not present

## 2020-10-19 DIAGNOSIS — L570 Actinic keratosis: Secondary | ICD-10-CM | POA: Diagnosis not present

## 2020-10-19 DIAGNOSIS — L821 Other seborrheic keratosis: Secondary | ICD-10-CM | POA: Diagnosis not present

## 2020-10-19 DIAGNOSIS — D2271 Melanocytic nevi of right lower limb, including hip: Secondary | ICD-10-CM | POA: Diagnosis not present

## 2020-10-19 DIAGNOSIS — D2371 Other benign neoplasm of skin of right lower limb, including hip: Secondary | ICD-10-CM | POA: Diagnosis not present

## 2020-10-19 DIAGNOSIS — Z808 Family history of malignant neoplasm of other organs or systems: Secondary | ICD-10-CM | POA: Diagnosis not present

## 2020-10-19 DIAGNOSIS — L578 Other skin changes due to chronic exposure to nonionizing radiation: Secondary | ICD-10-CM | POA: Diagnosis not present

## 2020-10-19 DIAGNOSIS — L7211 Pilar cyst: Secondary | ICD-10-CM | POA: Diagnosis not present

## 2020-10-19 DIAGNOSIS — Z87898 Personal history of other specified conditions: Secondary | ICD-10-CM | POA: Diagnosis not present

## 2020-10-19 DIAGNOSIS — Z85828 Personal history of other malignant neoplasm of skin: Secondary | ICD-10-CM | POA: Diagnosis not present

## 2020-10-19 DIAGNOSIS — Z86018 Personal history of other benign neoplasm: Secondary | ICD-10-CM | POA: Diagnosis not present

## 2020-10-19 DIAGNOSIS — D225 Melanocytic nevi of trunk: Secondary | ICD-10-CM | POA: Diagnosis not present

## 2020-11-03 ENCOUNTER — Telehealth: Payer: Self-pay | Admitting: Family

## 2020-11-03 ENCOUNTER — Telehealth: Payer: Self-pay | Admitting: Adult Health

## 2020-11-03 ENCOUNTER — Other Ambulatory Visit: Payer: Self-pay | Admitting: Family

## 2020-11-03 ENCOUNTER — Encounter: Payer: Self-pay | Admitting: Family

## 2020-11-03 DIAGNOSIS — U071 COVID-19: Secondary | ICD-10-CM

## 2020-11-03 NOTE — Telephone Encounter (Signed)
Spoke to the pt and informed her of the below message.  Nothing further needed.

## 2020-11-03 NOTE — Telephone Encounter (Signed)
I am sorry to hear that. Unfortunately, she would not qualify for a covid infusion. Right now due to supply they are only able to transfuse 7 people a day and are getting over 500 referrals for antibody infusions most days

## 2020-11-03 NOTE — Telephone Encounter (Signed)
pt tested positive for covid; pt want to know if Amber Brewer can set up an infusion  for her

## 2020-11-03 NOTE — Telephone Encounter (Signed)
I connected by phone with Amber Brewer on 11/03/2020 at 11:50 AM to discuss the potential use of a new treatment for mild to moderate COVID-19 viral infection in non-hospitalized patients.  This patient is a 70 y.o. female that meets the FDA criteria for Emergency Use Authorization of COVID monoclonal antibody sotrovimab.  Has a (+) direct SARS-CoV-2 viral test result  Has mild or moderate COVID-19   Is NOT hospitalized due to COVID-19  Is within 10 days of symptom onset  Has at least one of the high risk factor(s) for progression to severe COVID-19 and/or hospitalization as defined in EUA.  Specific high risk criteria : Older age (>/= 70 yo), BMI > 25 and Cardiovascular disease or hypertension  Symptoms  I have spoken and communicated the following to the patient or parent/caregiver regarding COVID monoclonal antibody treatment:  1. FDA has authorized the emergency use for the treatment of mild to moderate COVID-19 in adults and pediatric patients with positive results of direct SARS-CoV-2 viral testing who are 26 years of age and older weighing at least 40 kg, and who are at high risk for progressing to severe COVID-19 and/or hospitalization.  2. The significant known and potential risks and benefits of COVID monoclonal antibody, and the extent to which such potential risks and benefits are unknown.  3. Information on available alternative treatments and the risks and benefits of those alternatives, including clinical trials.  4. Patients treated with COVID monoclonal antibody should continue to self-isolate and use infection control measures (e.g., wear mask, isolate, social distance, avoid sharing personal items, clean and disinfect "high touch" surfaces, and frequent handwashing) according to CDC guidelines.   5. The patient or parent/caregiver has the option to accept or refuse COVID monoclonal antibody treatment.  After reviewing this information with the patient, the  patient has agreed to receive one of the available covid 19 monoclonal antibodies and will be provided an appropriate fact sheet prior to infusion. Loel Dubonnet, NP 11/03/2020 11:50 AM

## 2020-11-04 ENCOUNTER — Ambulatory Visit (HOSPITAL_COMMUNITY)
Admission: RE | Admit: 2020-11-04 | Discharge: 2020-11-04 | Disposition: A | Discharge status: Home / Self Care | Payer: PPO | Source: Ambulatory Visit | Attending: Pulmonary Disease | Admitting: Pulmonary Disease

## 2020-11-04 DIAGNOSIS — U071 COVID-19: Secondary | ICD-10-CM | POA: Diagnosis not present

## 2020-11-04 MED ORDER — SODIUM CHLORIDE 0.9 % IV SOLN: INTRAVENOUS | Status: DC | PRN

## 2020-11-04 MED ORDER — EPINEPHRINE 0.3 MG/0.3ML IJ SOAJ: 0.3000 mg | Freq: Once | INTRAMUSCULAR | Status: DC | PRN

## 2020-11-04 MED ORDER — FAMOTIDINE IN NACL 20-0.9 MG/50ML-% IV SOLN: 20.0000 mg | Freq: Once | INTRAVENOUS | Status: DC | PRN

## 2020-11-04 MED ORDER — DIPHENHYDRAMINE HCL 50 MG/ML IJ SOLN: 50.0000 mg | Freq: Once | INTRAMUSCULAR | Status: DC | PRN

## 2020-11-04 MED ORDER — SOTROVIMAB 500 MG/8ML IV SOLN
500.0000 mg | Freq: Once | INTRAVENOUS | Status: AC
  Administered 2020-11-04: 500 mg via INTRAVENOUS

## 2020-11-04 MED ORDER — ALBUTEROL SULFATE HFA 108 (90 BASE) MCG/ACT IN AERS: 2.0000 | INHALATION_SPRAY | Freq: Once | RESPIRATORY_TRACT | Status: DC | PRN

## 2020-11-04 MED ORDER — METHYLPREDNISOLONE SODIUM SUCC 125 MG IJ SOLR: 125.0000 mg | Freq: Once | INTRAMUSCULAR | Status: DC | PRN

## 2020-11-04 NOTE — Discharge Instructions (Signed)

## 2020-11-04 NOTE — Progress Notes (Signed)
Diagnosis: COVID-19  Physician: Dr. Patrick Wright  Procedure: [facility] Med: Sotrovimab infusion - Provided patient with sotrovimab fact sheet for patients, parents, and caregivers prior to infusion.   Complications: No immediate complications noted  Discharge: Discharged home    

## 2020-11-04 NOTE — Progress Notes (Signed)
Patient reviewed Fact Sheet for Patients, Parents, and Caregivers for Emergency Use Authorization (EUA) of Sotrovimab for the Treatment of Coronavirus. Patient also reviewed and is agreeable to the estimated cost of treatment. Patient is agreeable to proceed.   

## 2020-11-10 ENCOUNTER — Telehealth: Payer: Self-pay | Admitting: Pharmacist

## 2020-11-10 NOTE — Chronic Care Management (AMB) (Signed)
Chronic Care Management Pharmacy Assistant   Name: Kellin Fifer  MRN: [id number] DOB: 08-01-1951  Reason for Encounter: Medication Review  PCP : [name], NP  Allergies:   Allergies  Allergen Reactions  . Codeine Phosphate Nausea And Vomiting    REACTION: unspecified  . Prednisone Shortness Of Breath  . Betadine [Povidone Iodine] Hives    After surgical skin prep  . Sulfa Antibiotics Nausea And Vomiting  . Iodine Rash    Broke out on skin once years ago--**Betadine Skin Prep**    Medications: Outpatient Encounter Medications as of 11/10/2020  Medication Sig  . albuterol (PROAIR HFA) 108 (90 Base) MCG/ACT inhaler Inhale 1-2 puffs into the lungs every 4 (four) hours as needed.  . budesonide (RHINOCORT AQUA) 32 MCG/ACT nasal spray Place 1 spray into both nostrils daily as needed for allergies.   . carvedilol (COREG) 12.5 MG tablet Take 1 tablet (12.5 mg total) by mouth 2 (two) times daily with a meal.  . Cholecalciferol (VITAMIN D) 50 MCG (2000 UT) tablet Take 2,000 Units by mouth daily.   . citalopram (CELEXA) 10 MG tablet Take 0.5 tablets (5 mg total) by mouth daily. Takes 0.5 tablet of 10 mg  . Continuous Blood Gluc Receiver (FREESTYLE LIBRE 14 DAY READER) DEVI 1 Device by Does not apply route every 14 (fourteen) days.  . Continuous Blood Gluc Sensor (FREESTYLE LIBRE 14 DAY SENSOR) MISC 1 Device by Does not apply route every 14 (fourteen) days.  . cycloSPORINE (RESTASIS) 0.05 % ophthalmic emulsion Place 1 drop into both eyes 2 (two) times daily.  . DOTTI 0.05 MG/24HR patch APPLY 1 PATCH TOPICALLY ONTO THE SKIN 2 TIMES A WEEK (Patient taking differently: 0.5 patches. Uses 0.5 patch twice a week)  . famotidine (PEPCID) 20 MG tablet Take 1 tablet (20 mg total) by mouth daily.  . Flaxseed, Linseed, (FLAX SEED OIL) 1000 MG CAPS Take 1 capsule by mouth daily.  . Fluticasone-Umeclidin-Vilant (TRELEGY ELLIPTA) 200-62.5-25 MCG/INH AEPB Inhale 1 puff into the lungs daily.   . hydrochlorothiazide (HYDRODIURIL) 25 MG tablet Take 1 tablet (25 mg total) by mouth daily.  . irbesartan (AVAPRO) 150 MG tablet Take 2 tablets (300 mg total) by mouth daily. (Patient taking differently: Take 150 mg by mouth in the morning and at bedtime.)  . levothyroxine (SYNTHROID) 50 MCG tablet TAKE 1 TABLET(50 MCG) BY MOUTH DAILY BEFORE BREAKFAST  . Multiple Vitamins-Minerals (ALIVE WOMENS GUMMY PO) Take 1 tablet by mouth daily.  . multivitamin-lutein (OCUVITE-LUTEIN) CAPS capsule Take 1 capsule by mouth daily.  . Omega-3 Fatty Acids (FISH OIL PO) Take 540 mg by mouth daily. Nature Made Mini Omega-3  . potassium chloride (KLOR-CON) 10 MEQ tablet Take 1 tablet (10 mEq total) by mouth daily.  . rosuvastatin (CRESTOR) 10 MG tablet Take 1 tablet (10 mg total) by mouth daily. (Patient taking differently: Take 10 mg by mouth once a week.)  . triamcinolone (NASACORT) 55 MCG/ACT AERO nasal inhaler Place 1 spray into the nose daily. (Patient not taking: Reported on 09/28/2020)  . TURMERIC PO Take 500 mg by mouth daily.   . Zinc 100 MG TABS Take 1 tablet by mouth daily.   No facility-administered encounter medications on file as of 11/10/2020.    Current Diagnosis: Patient Active Problem List   Diagnosis Date Noted  . Myofascial pain syndrome, cervical 06/03/2018  . PVC's (premature ventricular contractions) 10/31/2016  . Benign hypertension 10/31/2016  . Anxiety 10/31/2016  . Hyperlipidemia 05/27/2016  . LEG PAIN,  BILATERAL 06/07/2010  . LEG EDEMA, BILATERAL 10/04/2009  . HYPOKALEMIA 07/20/2009  . ALLERGY, HX OF 11/24/2008  . Anxiety disorder 05/19/2008  . 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 Pain in the chest 05/19/2008  . 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 HYPOTHYROIDISM, BORDERLINE 12/16/2007  . PANIC ATTACK 06/04/2007  . DISORDER, 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  . Essential hypertension 05/25/2007  . OSTEOARTHRITIS 05/25/2007  . NEPHROLITHIASIS, HX OF 05/25/2007  . ARTHRITIS,  HX OF 05/25/2007    Goals Addressed   None    Reviewed chart for medication changes ahead of medication coordination call. No OVs, Consults, or hospital visits since last care coordination call/Pharmacist visit.  No medication changes indicated.  BP Readings from Last 3 Encounters:  11/04/20 (!) 150/80  09/28/20 128/76  06/20/20 126/72    Lab Results  Component Value Date   HGBA1C 5.7 (A) 09/28/2020     Patient obtains medications through Adherence Packaging  90 Days  Last adherence delivery included:  . Freestyle Libre 14-day [employer] . Trelegy Ellipta 200-62.5-25  Patient declined the following last month due to PRN use/additional supply on hand. . Rhinocort Allergy 32 mcg/act -PRN . Albuterol (PROAIR HFA) 108 (90 Base) MCG/ACT inhaler . DOTTI 0.05 MG/24 HR patch . Carvedilol 12.5 mg: One tablet twice daily at breakfast and dinner . Citalopram 10 mg:  tablet daily at breakfast   . Famotidine 20 mg: One tablet daily at dinner  . Irbesartan 150 mg: One tablet twice daily at breakfast and dinner . Rosuvastatin 10 mg: One tablet every Monday night . Potassium Chloride 10 MEQ: One tablet daily at lunch . Vitamin D3 2000 units daily: one tablet daily at dinner . Turmeric 500 mg: One tablet daily at dinner . Fish Oil 540 mg daily (Nature Made Mini Omega) daily at lunch . [place] Adult Multivitamin 50+ 1 capsule daily -Breakfast . Levothyroxine 50 MCG: one tablet daily . Hydrochlorothiazide 25 MG tablet: one tablet at lunch  I spoke with the patient and review medications. There are no changes in medications currently. Patient declined these last month due to PRN use/additional supply on hand.The patient is taking the following medications: . Rhinocort Allergy 32 mcg/act -PRN . Albuterol (PROAIR HFA) 108 (90 Base) MCG/ACT inhaler . DOTTI 0.05 MG/24 HR patch . Carvedilol 12.5 mg: One tablet twice daily at breakfast and dinner . Citalopram 10 mg:  tablet daily  at breakfast   . Famotidine 20 mg: One tablet daily at dinner  . Irbesartan 150 mg: One tablet twice daily at breakfast and dinner . Rosuvastatin 10 mg: One tablet every Monday night . Potassium Chloride 10 MEQ: One tablet daily at lunch . Vitamin D3 2000 units daily: one tablet daily at dinner . Turmeric 500 mg: One tablet daily at dinner . Fish Oil 540 mg daily (Nature Made Mini Omega) daily at lunch . [place] Adult Multivitamin 50+ 1 capsule daily -Breakfast . Levothyroxine 50 MCG: one tablet daily . Hydrochlorothiazide 25 MG tablet: one tablet at lunch . Freestyle Libre 14-day [employer] . Trelegy Ellipta 200-62.5-25  She currently does not need refills. Confirmed delivery date of 12-14-2020, advised patient that pharmacy will contact them the morning of delivery.  Follow-Up:  Coordination of Enhanced Pharmacy Services and Pharmacist Review   [name], [place] Assistant [phone]

## 2020-11-16 ENCOUNTER — Ambulatory Visit: Payer: PPO

## 2020-12-11 ENCOUNTER — Telehealth: Payer: Self-pay | Admitting: Pharmacist

## 2020-12-11 NOTE — Chronic Care Management (AMB) (Signed)
I left the patient a message about her upcoming appointment on 12/12/2020 @ 9:00 AM  with the clinical pharmacist. She was asked to please have all medication on hand to review with the pharmacist.   Neita Goodnight) Mare Ferrari, [place] Assistant [phone]

## 2020-12-12 ENCOUNTER — Other Ambulatory Visit: Payer: Self-pay | Admitting: Internal Medicine

## 2020-12-12 ENCOUNTER — Ambulatory Visit (INDEPENDENT_AMBULATORY_CARE_PROVIDER_SITE_OTHER): Payer: PPO | Admitting: Pharmacist

## 2020-12-12 DIAGNOSIS — E039 Hypothyroidism, unspecified: Secondary | ICD-10-CM

## 2020-12-12 DIAGNOSIS — I10 Essential (primary) hypertension: Secondary | ICD-10-CM | POA: Diagnosis not present

## 2020-12-12 DIAGNOSIS — E785 Hyperlipidemia, unspecified: Secondary | ICD-10-CM | POA: Diagnosis not present

## 2020-12-12 NOTE — Progress Notes (Signed)
Chronic Care Management Pharmacy Note  12/21/2020 Name:  Amber Brewer MRN:  [id number] DOB:  10-28-1950  Subjective: Amber Brewer is an 70 y.o. year old female who is a primary patient of Amber Peng, NP.  The CCM team was consulted for assistance with disease management and care coordination needs.    Engaged with patient by telephone for follow up visit in response to provider referral for pharmacy case management and/or care coordination services.   Consent to Services:  The patient was given information about Chronic Care Management services, agreed to services, and gave verbal consent prior to initiation of services.  Please see initial visit note for detailed documentation.   Patient Care Team: Amber Peng, NP as PCP - General (Family Medicine) Amber Mc Donnamarie Poag, MD as Consulting Physician (Allergy and Immunology) Amber Brewer, [facility] as Pharmacist (Pharmacist)  Recent office visits: 09/28/20 Amber Peng, NP: Patient presented for chronic conditions follow up. A1c stable and LDL significantly improved.  06/08/20: Patient presented to Amber Peng, NP for follow-up. Metformin stopped.  Recent consult visits: 11/04/20 Patient presented for sotrovimab infusion for COVID infection.   06/20/20 Allena Katz, MD (allergy): Patient presented for asthma follow up.  01/25/20 OV (Kozlow, Allergy) - f/u visit. No exacerbation for the past months and can exercise moderately for 15 mins without much difficulty. Switched Arnuity to [employer].   Hospital visits: None in previous 6 months  Objective:  Lab Results  Component Value Date   CREATININE 0.92 03/02/2020   BUN 15 03/02/2020   GFR 60.50 03/02/2020   GFRNONAA 62 06/03/2018   GFRAA 72 06/03/2018   NA 138 03/02/2020   K 4.1 03/02/2020   CALCIUM 9.7 03/02/2020   CO2 30 03/02/2020    Lab Results  Component Value Date/Time   HGBA1C 5.7 (A) 09/28/2020 09:49 AM   HGBA1C 5.6 06/08/2020 08:58 AM   HGBA1C 6.5 03/02/2020  07:32 AM   HGBA1C 6.1 12/08/2018 09:30 AM   GFR 60.50 03/02/2020 07:32 AM   GFR 65.48 09/24/2019 10:04 AM    Last diabetic Eye exam:  Lab Results  Component Value Date/Time   HMDIABEYEEXA No Retinopathy 04/17/2020 12:00 AM    Last diabetic Foot exam: No results found for: HMDIABFOOTEX   Lab Results  Component Value Date   CHOL 154 09/28/2020   HDL 67 09/28/2020   LDLCALC 73 09/28/2020   LDLDIRECT 143.5 01/15/2011   TRIG 56 09/28/2020   CHOLHDL 2.3 09/28/2020    Hepatic Function Latest Ref Rng & Units 03/02/2020 09/24/2019 12/08/2018  Total Protein 6.0 - 8.3 g/dL 6.8 6.6 6.9  Albumin 3.5 - 5.2 g/dL 4.3 4.2 4.2  AST 0 - 37 U/L _0 ALT 0 - 35 U/L _1 Alk Phosphatase 39 - 117 U/L 80 76 83  Total Bilirubin 0.2 - 1.2 mg/dL 0.7 0.5 0.8  Bilirubin, Direct 0.0 - 0.3 mg/dL - - -    Lab Results  Component Value Date/Time   TSH 2.79 09/28/2020 10:07 AM   TSH 4.73 (H) 03/02/2020 07:32 AM   FREET4 0.86 12/08/2018 09:30 AM   FREET4 0.76 05/06/2018 09:27 AM    CBC Latest Ref Rng & Units 03/02/2020 09/24/2019 12/08/2018  WBC 4.0 - 10.5 K/uL 6.8 6.8 6.2  Hemoglobin 12.0 - 15.0 g/dL 13.6 13.6 13.1  Hematocrit 36.0 - 46.0 % 40.9 41.9 39.7  Platelets 150.0 - 400.0 K/uL 195.0 226.0 242.0    Lab Results  Component Value Date/Time  VD25OH 36 08/17/2009 09:30 PM    Clinical ASCVD: No  The 10-year ASCVD risk score Mikey Bussing DC Jr., et al., 2013) is: 11.1%   Values used to calculate the score:     Age: 55 years     Sex: Female     Is Non-Hispanic African American: No     Diabetic: No     Tobacco smoker: No     Systolic Blood Pressure: 035 mmHg     Is BP treated: Yes     HDL Cholesterol: 67 mg/dL     Total Cholesterol: 154 mg/dL    Depression screen [facility] 2/9 03/02/2020  Decreased Interest 0  Down, Depressed, Hopeless 0  PHQ - 2 Score 0      Social History   Tobacco Use  Smoking Status Never Smoker  Smokeless Tobacco Never Used   BP Readings from Last 3 Encounters:   12/19/20 136/70  11/04/20 (!) 150/80  09/28/20 128/76   Pulse Readings from Last 3 Encounters:  12/19/20 62  11/04/20 (!) 54  09/28/20 (!) 58   Wt Readings from Last 3 Encounters:  12/19/20 185 lb 9.6 oz (84.2 kg)  09/28/20 184 lb 6.4 oz (83.6 kg)  06/08/20 181 lb (82.1 kg)    Assessment/Interventions: Review of patient past medical history, allergies, medications, health status, including review of consultants reports, laboratory and other test data, was performed as part of comprehensive evaluation and provision of chronic care management services.   SDOH:  (Social Determinants of Health) assessments and interventions performed: Yes   CCM Care Plan  Allergies  Allergen Reactions  . Codeine Phosphate Nausea And Vomiting    REACTION: unspecified  . Prednisone Shortness Of Breath  . Betadine [Povidone Iodine] Hives    After surgical skin prep  . Sulfa Antibiotics Nausea And Vomiting  . Iodine Rash    Broke out on skin once years ago--**Betadine Skin Prep**    Medications Reviewed Today    Reviewed by Jiles Prows, MD (Physician) on 12/20/20 at [phone]  Med List Status: <None>  Medication Order Taking? Sig Documenting Provider Last Dose Status Informant  albuterol (PROAIR HFA) 108 (90 Base) MCG/ACT inhaler [id number]  Inhale 1-2 puffs into the lungs every 4 (four) hours as needed. Kozlow, Donnamarie Poag, MD  Active   budesonide (RHINOCORT AQUA) 32 MCG/ACT nasal spray [id number] Yes Place 1 spray into both nostrils daily as needed for allergies.  [provider] Taking Active Self  carvedilol (COREG) 12.5 MG tablet [id number] Yes Take 1 tablet (12.5 mg total) by mouth 2 (two) times daily with a meal. Hilty, Nadean Corwin, MD Taking Active   Cholecalciferol (VITAMIN D) 50 MCG (2000 UT) tablet [id number] Yes Take 2,000 Units by mouth daily.  [provider] Taking Active Self  citalopram (CELEXA) 10 MG tablet [id number] Yes Take 0.5 tablets (5 mg total) by mouth daily. Amber Peng, NP Taking Active   Continuous Blood Gluc Receiver (FREESTYLE LIBRE 14 DAY READER) [place]VI [id number] Yes 1 Device by Does not apply route every 14 (fourteen) days. Nafziger, Tommi Rumps, NP Taking Active   Continuous Blood Gluc Sensor (FREESTYLE LIBRE [place]) [place] [id number] Yes 1 Device by Does not apply route every 14 (fourteen) days. Nafziger, Tommi Rumps, NP Taking Active   cycloSPORINE (RESTASIS) 0.05 % ophthalmic emulsion [id number] Yes Place 1 drop into both eyes 2 (two) times daily. [provider] Taking Active Self  DOTTI 0.05 MG/24HR patch [id number] Yes APPLY 1 PATCH TOPICALLY ONTO THE SKIN 2  TIMES A WEEK  Patient taking differently: 0.5 patches. Uses 0.5 patch twice a week   Nafziger, Tommi Rumps, NP Taking Active Self  famotidine (PEPCID) 20 MG tablet [id number]  Take 1 tablet (20 mg total) by mouth daily. Kozlow, Donnamarie Poag, MD  Active   Flaxseed, Linseed, (FLAX SEED OIL) 1000 MG CAPS [id number] Yes Take 1 capsule by mouth daily. [provider] Taking Active Self  Fluticasone-Umeclidin-Vilant (TRELEGY ELLIPTA) 200-62.5-25 MCG/INH AEPB [id number]  Inhale 1 puff into the lungs daily. Kozlow, Donnamarie Poag, MD  Active   hydrochlorothiazide (HYDRODIURIL) 25 MG tablet [id number] Yes Take 1 tablet (25 mg total) by mouth daily. Nafziger, Tommi Rumps, NP Taking Active   irbesartan (AVAPRO) 150 MG tablet [id number] Yes Take 2 tablets (300 mg total) by mouth daily. Pixie Casino, MD Taking Active   levothyroxine (SYNTHROID) 50 MCG tablet [id number] Yes TAKE 1 TABLET(50 MCG) BY MOUTH DAILY BEFORE BREAKFAST Nafziger, Tommi Rumps, NP Taking Active   Multiple Vitamins-Minerals (ALIVE WOMENS GUMMY PO) [id number] Yes Take 1 tablet by mouth daily. [provider] Taking Active   multivitamin-lutein [facility]) CAPS capsule [id number] Yes Take 1 capsule by mouth daily. [provider] Taking Active   Omega-3 Fatty Acids (FISH OIL PO) [id number] Yes Take 540 mg by mouth daily. Nature Made Mini Omega-3 [provider] Taking Active Self  potassium chloride (KLOR-CON) 10 MEQ tablet [id number] Yes Take 1 tablet (10 mEq total) by mouth daily. Nafziger, Tommi Rumps, NP Taking Active   rosuvastatin (CRESTOR) 10 MG tablet [id number] Yes Take 1 tablet (10 mg total) by mouth daily.  Patient taking differently: Take 10 mg by mouth once a week.   Nafziger, Tommi Rumps, NP Taking Active   triamcinolone (NASACORT) 55 MCG/ACT AERO nasal inhaler [id number] Yes Place 1 spray into the nose daily. Amber Peng, NP Taking Active   TURMERIC PO [id number] Yes Take 500 mg by mouth daily.  [provider] Taking Active   Zinc 100 MG TABS [id number] Yes Take 1 tablet by mouth daily. [provider] Taking Active           Patient Active Problem List   Diagnosis Date Noted  . Myofascial pain syndrome, cervical 06/03/2018  . PVC's (premature ventricular contractions) 10/31/2016  . Benign hypertension 10/31/2016  . Anxiety 10/31/2016  . Hyperlipidemia 05/27/2016  . LEG PAIN, BILATERAL 06/07/2010  . LEG EDEMA, BILATERAL 10/04/2009  . HYPOKALEMIA 07/20/2009  . ALLERGY, HX OF 11/24/2008  . Anxiety disorder 05/19/2008  . 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 Pain in the chest 05/19/2008  . 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 HYPOTHYROIDISM, BORDERLINE 12/16/2007  . PANIC ATTACK 06/04/2007  . DISORDER, 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  . Essential hypertension 05/25/2007  . OSTEOARTHRITIS 05/25/2007  . NEPHROLITHIASIS, HX OF 05/25/2007  . ARTHRITIS, HX OF 05/25/2007    Immunization History  Administered Date(s) Administered  . Influenza Split 09/05/2015  . Influenza Whole 09/10/2007, 07/20/2009, 07/27/2011  . Influenza, High Dose Seasonal PF 07/24/2019  . Influenza,inj,Quad PF,6+ Mos 08/22/2018  . Influenza-Unspecified 07/05/2020  . PFIZER(Purple Top)SARS-COV-2 Vaccination 12/14/2019, 01/04/2020, 09/18/2020  . Pneumococcal Conjugate-13 12/10/2016  . Pneumococcal Polysaccharide-23 12/16/2017  . Td  10/21/2002  . Tdap 01/29/2011    Conditions to be addressed/monitored:  Hypertension, Hyperlipidemia, Diabetes, Asthma, Hypothyroidism and Anxiety  Care Plan : [place]  Updates made by Amber Brewer, Lyle since 12/21/2020 12:00 AM    Problem: Problem: Hypertension, Hyperlipidemia, Diabetes, Asthma, Hypothyroidism and Anxiety     Long-Range Goal: Patient-Specific Goal  Start Date: 12/12/2020  Expected End Date: 12/12/2021  This Visit's Progress: On track  Priority: High  Note:   Current Barriers:  . Unable to independently monitor therapeutic efficacy  Pharmacist Clinical Goal(s):  Marland Kitchen Over the next 180 days, patient will verbalize ability to afford treatment regimen . achieve adherence to monitoring guidelines and medication adherence to achieve therapeutic efficacy through collaboration with PharmD and provider.   Interventions: . 1:1 collaboration with Amber Peng, NP regarding development and update of comprehensive plan of care as evidenced by provider attestation and co-signature . Inter-disciplinary care team collaboration (see longitudinal plan of care) . Comprehensive medication review performed; medication list updated in electronic medical record  Hypertension (BP goal <140/90) -Controlled -Current treatment:  Carvedilol 12.5 mg 1 tablet twice daily with a meal  HCTZ 25 mg 1 tablet daily at lunch  Irbesartan 150 mg 1 tablet in the morning and at night -Medications previously tried: Bystolic -Current home readings: 129/77 (average) -Current dietary habits: did not discuss -Current exercise habits: not currently exercising -Denies hypotensive/hypertensive symptoms -Educated on Exercise goal of 150 minutes per week; Importance of home blood pressure monitoring; -Counseled to monitor BP at home at least weekly, document, and provide log at future appointments -Counseled on diet and exercise extensively Recommended to continue current  medication  Hyperlipidemia: (LDL goal < 100) -Controlled -Current treatment: . Rosuvastatin 10 mg 1 tablet once weekly -Medications previously tried: none  -Current dietary patterns: patient has not been eating as well lately due to stress -Current exercise habits: not exercising  -Educated on Cholesterol goals;  Importance of limiting foods high in cholesterol; Exercise goal of 150 minutes per week; -Recommended to continue current medication  Pre-diabetes (A1c goal <6.5%) -Controlled -Current medications: . No medications -Medications previously tried: metformin  -Current home glucose readings: using CGM -Denies hypoglycemic/hyperglycemic symptoms -Current meal patterns:  . breakfast: n/a  . lunch: n/a  . dinner: n/a . snacks: n/a . drinks: n/a -Current exercise: not exercising currently -Educated onExercise goal of 150 minutes per week; Carbohydrate counting and/or plate method -Counseled to check feet daily and get yearly eye exams -Counseled on diet and exercise extensively  Asthma (Goal: control symptoms and prevent exacerbations) -Controlled -Current treatment   Albuterol HFA 1-2 puffs into the lungs every 4 hrs as needed  Trelegy Ellipta 1 puff into the lungs daily  -Medications previously tried: Arnuity  -Patient reports consistent use of maintenance inhaler -Frequency of rescue inhaler use: not often -Counseled on When to use rescue inhaler -Recommended use of albuterol when she is around dust Assessed patient finances. Will reach out the [place] to see if job loss will allow for patient assistance.   Anxiety (Goal: minimize symptoms) -Controlled -Current treatment: . Citalopram 10 mg 0.5 tablet daily -Medications previously tried/failed: diazepam, higher dose of citalopram (drowsiness) -PHQ9: n/a -GAD7: n/a -Educated on Benefits of medication for symptom control Benefits of cognitive-behavioral therapy with or without medication -Recommended to continue  current medication Patient will be utilizing hospice resources after her mom's death for counseling  Post menopausal (Goal: minimize symptoms ) -Controlled -Current treatment  . Dotti 0.05 mg/24 hr patch apply 0.5 patch onto the skin twice a week  -Medications previously tried: none  -Counseled on risks of taking estrogen therapy long term such as strokes, blood clots, and cancer  Health Maintenance -Vaccine gaps: Shingrix -Current therapy:   Vitamin D 2000 units take 1 tablet daily  Restasis 0.05% eye drop 1 drop into both eyes twice daily  Famotidine 20 mg 1 tablet daily  Nature Made 540 mg Omega-3 Fish Oil.   Turmeric 1 capsule once daily  Budesonide 32 mcg/act nasa; spray 1 spray into both nostrils daily as needed  Alive Women's Gummy 1 daily  -Educated on Cost vs benefit of each product must be carefully weighed by individual consumer -Patient is satisfied with current therapy and denies issues -Recommended to continue current medication  Patient Goals/Self-Care Activities . Over the next 180 days, patient will:  - take medications as prescribed check blood pressure at least weekly, document, and provide at future appointments target a minimum of 150 minutes of moderate intensity exercise weekly  Follow Up Plan: Telephone follow up appointment with care management team member scheduled for: 6 months       Medication Assistance: None required.  Patient affirms current coverage meets needs.  Patient's preferred pharmacy is:  [facility] - [place], [place] - [address] [place] [address] Phone: [phone] Fax: [facility] #[id number] [address], [address] AT [place] [address] Phone: [phone] Fax: [phone]  Uses pill box? No - adherence packaging Pt endorses 100% compliance  We discussed: Benefits of  medication synchronization, packaging and delivery as well as enhanced pharmacist oversight with [facility]. Patient decided to: Utilize [facility] pharmacy for medication synchronization, packaging and delivery  Care Plan and Follow Up Patient Decision:  Patient agrees to Care Plan and Follow-up.  Plan: Telephone follow up appointment with care management team member scheduled for:  6 months  Jeni Salles, PharmD [place] Pharmacist [place] [facility] [place] [phone]

## 2020-12-13 ENCOUNTER — Other Ambulatory Visit: Payer: Self-pay | Admitting: Internal Medicine

## 2020-12-13 ENCOUNTER — Telehealth: Payer: PPO

## 2020-12-14 ENCOUNTER — Telehealth: Payer: Self-pay | Admitting: Family Medicine

## 2020-12-14 ENCOUNTER — Other Ambulatory Visit: Payer: Self-pay

## 2020-12-14 DIAGNOSIS — F411 Generalized anxiety disorder: Secondary | ICD-10-CM

## 2020-12-14 MED ORDER — CITALOPRAM HYDROBROMIDE 10 MG PO TABS: 5.0000 mg | ORAL_TABLET | Freq: Every day | ORAL | 0 refills | Status: DC

## 2020-12-14 MED ORDER — IRBESARTAN 150 MG PO TABS: 300.0000 mg | ORAL_TABLET | Freq: Every day | ORAL | 0 refills | Status: DC

## 2020-12-14 NOTE — Telephone Encounter (Signed)
-----   Message from Viona Gilmore, [facility] sent at 12/12/2020  9:55 AM EST ----- Regarding: Citalopram refill Hi,  I just spoke with Amber Brewer and she requested a refill of her citalopram 10 mg 1/2 tablet daily to be sent to [facility].  Thank you, [name]

## 2020-12-14 NOTE — Telephone Encounter (Signed)
Sent to the pharmacy by e-scribe. 

## 2020-12-19 ENCOUNTER — Ambulatory Visit: Payer: PPO | Admitting: Allergy and Immunology

## 2020-12-19 ENCOUNTER — Encounter: Payer: Self-pay | Admitting: Allergy and Immunology

## 2020-12-19 ENCOUNTER — Other Ambulatory Visit: Payer: Self-pay

## 2020-12-19 VITALS — BP 136/70 | HR 62 | Temp 97.4°F | Resp 18 | Ht 64.0 in | Wt 185.6 lb

## 2020-12-19 DIAGNOSIS — J3089 Other allergic rhinitis: Secondary | ICD-10-CM

## 2020-12-19 DIAGNOSIS — K219 Gastro-esophageal reflux disease without esophagitis: Secondary | ICD-10-CM | POA: Diagnosis not present

## 2020-12-19 DIAGNOSIS — J4540 Moderate persistent asthma, uncomplicated: Secondary | ICD-10-CM

## 2020-12-19 MED ORDER — ALBUTEROL SULFATE HFA 108 (90 BASE) MCG/ACT IN AERS: 1.0000 | INHALATION_SPRAY | RESPIRATORY_TRACT | 0 refills | Status: DC | PRN

## 2020-12-19 MED ORDER — FAMOTIDINE 20 MG PO TABS: 20.0000 mg | ORAL_TABLET | Freq: Every day | ORAL | 1 refills | Status: DC

## 2020-12-19 MED ORDER — TRELEGY ELLIPTA 200-62.5-25 MCG/INH IN AEPB: 1.0000 | INHALATION_SPRAY | Freq: Every day | RESPIRATORY_TRACT | 1 refills | Status: DC

## 2020-12-19 NOTE — Patient Instructions (Addendum)
    1. Continue to Treat inflammation:   A. Trelegy 200 - one inhalation daily   B. OTC Rhinocort or Nasacort 1 spray each nostril 3-7 times a week  2. Continue to Treat reflux:   A. Famotidine 20 mg - one time per day  3. If needed:   A. nasal saline wash  B. ProAir HFA 2 puffs every 4-6 hours  C. OTC antihistamine - Claritin/Allegra/Zyrtec  D. OTC Mucinex DM 2 tablets twice a day  4. Return to clinic in 12 months or earlier if problem

## 2020-12-19 NOTE — Progress Notes (Signed)
Amber Brewer   Follow-up Note  Referring Provider: [name], NP Primary Provider: [name], NP Date of Office Visit: 12/19/2020  Subjective:   Amber Brewer (DOB: 02/08/1951) is a 70 y.o. female who returns to the [facility] on 12/19/2020 in re-evaluation of the following:  HPI: Amber Brewer presents to this clinic in evaluation of asthma and allergic rhinitis and LPR.  Her last visit to this clinic was 20 June 2020.  She has really done well since her last visit and has not required a systemic steroid or antibiotic for any type of breathing issue.  She continues to use her triple inhaler on a regular basis and rarely uses a short acting bronchodilator.  Her nose has been doing well while intermittently using a nasal steroid.  Her reflux has been under good control using famotidine 20 mg once a day.  She has received 3 Moderna Covid vaccines and a flu vaccine.  In January 2022 she contracted Covid manifested mostly as a pharyngitis and a cough.  Although those 2 issues appear to have improved significantly she has developed some sternal chest pain when she still has a little bit of a lingering cough.  This is an intermittent sternal chest pain unassociated with any other symptoms.  It only appears to occur when she does cough.  She is now 6 weeks out from onset of her acute infection.  Allergies as of 12/19/2020      Reactions   Codeine Phosphate Nausea And Vomiting   REACTION: unspecified   Prednisone Shortness Of Breath   Betadine [povidone Iodine] Hives   After surgical skin prep   Sulfa Antibiotics Nausea And Vomiting   Iodine Rash   Broke out on skin once years ago--**Betadine Skin Prep**      Medication List      albuterol 108 (90 Base) MCG/ACT inhaler Commonly known as: ProAir HFA Inhale 1-2 puffs into the lungs every 4 (four) hours as needed.   budesonide 32 MCG/ACT nasal spray Commonly known as:  RHINOCORT AQUA Place 1 spray into both nostrils daily as needed for allergies.   carvedilol 12.5 MG tablet Commonly known as: COREG Take 1 tablet (12.5 mg total) by mouth 2 (two) times daily with a meal.   citalopram 10 MG tablet Commonly known as: CELEXA Take 0.5 tablets (5 mg total) by mouth daily.   cycloSPORINE 0.05 % ophthalmic emulsion Commonly known as: RESTASIS Place 1 drop into both eyes 2 (two) times daily.   Dotti 0.05 MG/24HR patch Generic drug: estradiol APPLY 1 PATCH TOPICALLY ONTO THE SKIN 2 TIMES A WEEK   famotidine 20 MG tablet Commonly known as: PEPCID Take 1 tablet (20 mg total) by mouth daily.   FISH OIL PO Take 540 mg by mouth daily. Nature Made Mini Omega-3   Flax Seed Oil 1000 MG Caps Take 1 capsule by mouth daily.   FreeStyle Libre 14 Day Reader Kerrin Mo 1 Device by Does not apply route every 14 (fourteen) days.   FreeStyle Libre 14 Day Sensor Misc 1 Device by Does not apply route every 14 (fourteen) days.   hydrochlorothiazide 25 MG tablet Commonly known as: HYDRODIURIL Take 1 tablet (25 mg total) by mouth daily.   irbesartan 150 MG tablet Commonly known as: AVAPRO Take 2 tablets (300 mg total) by mouth daily.   levothyroxine 50 MCG tablet Commonly known as: Synthroid TAKE 1 TABLET(50 MCG) BY MOUTH DAILY BEFORE BREAKFAST  multivitamin-lutein Caps capsule Take 1 capsule by mouth daily.   ALIVE WOMENS GUMMY PO Take 1 tablet by mouth daily.   potassium chloride 10 MEQ tablet Commonly known as: KLOR-CON Take 1 tablet (10 mEq total) by mouth daily.   rosuvastatin 10 MG tablet Commonly known as: Crestor Take 1 tablet (10 mg total) by mouth daily. What changed: when to take this   Trelegy Ellipta 200-62.5-25 MCG/INH Aepb Generic drug: Fluticasone-Umeclidin-Vilant Inhale 1 puff into the lungs daily.   triamcinolone 55 MCG/ACT Aero nasal inhaler Commonly known as: NASACORT Place 1 spray into the nose daily.   TURMERIC PO Take 500 mg  by mouth daily.   Vitamin D 50 MCG (2000 UT) tablet Take 2,000 Units by mouth daily.   Zinc 100 MG Tabs Take 1 tablet by mouth daily.       Past Medical History:  Diagnosis Date  . Asthma   . Chicken pox   . DDD (degenerative disc disease), cervical   . Depression   . Gallstones   . GERD (gastroesophageal reflux disease)   . Hx of melanoma in situ 2002  . Hx: UTI (urinary tract infection)   . Hyperlipidemia   . Hypertension   . Hypothyroidism   . IBS (irritable bowel syndrome)   . Melanoma ([place])    x2   . Nephrolithiasis   . Osteoarthritis   . PVC's (premature ventricular contractions)   . Status post dilation of esophageal narrowing     Past Surgical History:  Procedure Laterality Date  . ABDOMINAL HYSTERECTOMY  1974  . APPENDECTOMY  1977  . BLADDER SURGERY  1974  . BREAST EXCISIONAL BIOPSY    . CATARACT EXTRACTION Bilateral 2015  . CHOLECYSTECTOMY  1987  . removal of melanoma in situ      Review of systems negative except as noted in HPI / PMHx or noted below:  Review of Systems  Constitutional: Negative.   HENT: Negative.   Eyes: Negative.   Respiratory: Negative.   Cardiovascular: Negative.   Gastrointestinal: Negative.   Genitourinary: Negative.   Musculoskeletal: Negative.   Skin: Negative.   Neurological: Negative.   Endo/Heme/Allergies: Negative.   Psychiatric/Behavioral: Negative.      Objective:   Vitals:   12/19/20 1056  BP: 136/70  Pulse: 62  Resp: 18  Temp: (!) 97.4 F (36.3 C)   Height: 5\' 4"  (162.6 cm)  Weight: 185 lb 9.6 oz (84.2 kg)   Physical Exam Constitutional:      Appearance: She is not diaphoretic.  HENT:     Head: Normocephalic.     Right Ear: Tympanic membrane, ear canal and external ear normal.     Left Ear: Tympanic membrane, ear canal and external ear normal.     Nose: Nose normal. No mucosal edema or rhinorrhea.     Mouth/Throat:     Mouth: Oropharynx is clear and moist and mucous membranes are normal.      Pharynx: Uvula midline. No oropharyngeal exudate.  Eyes:     Conjunctiva/sclera: Conjunctivae normal.  Neck:     Thyroid: No thyromegaly.     Trachea: Trachea normal. No tracheal tenderness or tracheal deviation.  Cardiovascular:     Rate and Rhythm: Normal rate and regular rhythm.     Heart sounds: Normal heart sounds, S1 normal and S2 normal. No murmur heard.   Pulmonary:     Effort: No respiratory distress.     Breath sounds: Normal breath sounds. No stridor. No wheezing or rales.  Musculoskeletal:        General: No edema.  Lymphadenopathy:     Head:     Right side of head: No tonsillar adenopathy.     Left side of head: No tonsillar adenopathy.     Cervical: No cervical adenopathy.  Skin:    Findings: No erythema or rash.     Nails: There is no clubbing.  Neurological:     Mental Status: She is alert.     Diagnostics:    Spirometry was performed and demonstrated an FEV1 of 1.79 at 77 % of predicted.  Assessment and Plan:   1. Asthma, moderate persistent, well-controlled   2. Other allergic rhinitis   3. LPRD (laryngopharyngeal reflux disease)      1. Continue to Treat inflammation:   A. Trelegy 200 - one inhalation daily   B. OTC Rhinocort or Nasacort 1 spray each nostril 3-7 times a week  2. Continue to Treat reflux:   A. Famotidine 20 mg - one time per day  3. If needed:   A. nasal saline wash  B. ProAir HFA 2 puffs every 4-6 hours  C. OTC antihistamine - Claritin/Allegra/Zyrtec  D. OTC Mucinex DM 2 tablets twice a day  4. Return to clinic in 12 months or earlier if problem  Overall Amber Brewer appears to be doing quite well.  She has a very good understanding of her disease state and the appropriate use of her medications and appropriate dosing of her medications depending on disease activity and I am going to refill all of these medications and see her back in this clinic in 1 year or earlier if there is a problem.  Allena Katz, MD Allergy /  Immunology [place]

## 2020-12-20 ENCOUNTER — Encounter: Payer: Self-pay | Admitting: Allergy and Immunology

## 2020-12-21 NOTE — Patient Instructions (Addendum)
Amber Brewer,  It was lovely to meet you over the phone! Keep up the good work with checking your blood pressure weekly and taking your medications as prescribed. I attached some information that may be helpful for lowering your cholesterol through diet and exercise as we discussed.  Just as a reminder, don't forget to look into getting your shingles shot at the pharmacy.  Please let me know if you need anything before our follow up!  Best, [name]  Jeni Salles, PharmD [facility] Clinical Pharmacist [place] [place]   Visit Information  Goals Addressed   None    Patient Care Plan: CCM Pharmacy Care Plan    Problem Identified: Problem: Hypertension, Hyperlipidemia, Diabetes, Asthma, Hypothyroidism and Anxiety     Long-Range Goal: Patient-Specific Goal   Start Date: 12/12/2020  Expected End Date: 12/12/2021  This Visit's Progress: On track  Priority: High  Note:   Current Barriers:  . Unable to independently monitor therapeutic efficacy  Pharmacist Clinical Goal(s):  Marland Kitchen Over the next 180 days, patient will verbalize ability to afford treatment regimen . achieve adherence to monitoring guidelines and medication adherence to achieve therapeutic efficacy through collaboration with PharmD and provider.   Interventions: . 1:1 collaboration with [name], NP regarding development and update of comprehensive plan of care as evidenced by provider attestation and co-signature . Inter-disciplinary care team collaboration (see longitudinal plan of care) . Comprehensive medication review performed; medication list updated in electronic medical record  Hypertension (BP goal <140/90) -Controlled -Current treatment:  Carvedilol 12.5 mg 1 tablet twice daily with a meal  HCTZ 25 mg 1 tablet daily at lunch  Irbesartan 150 mg 1 tablet in the morning and at night -Medications previously tried: Bystolic -Current home readings: 129/77 (average) -Current  dietary habits: did not discuss -Current exercise habits: not currently exercising -Denies hypotensive/hypertensive symptoms -Educated on Exercise goal of 150 minutes per week; Importance of home blood pressure monitoring; -Counseled to monitor BP at home at least weekly, document, and provide log at future appointments -Counseled on diet and exercise extensively Recommended to continue current medication  Hyperlipidemia: (LDL goal < 100) -Controlled -Current treatment: . Rosuvastatin 10 mg 1 tablet once weekly -Medications previously tried: none  -Current dietary patterns: patient has not been eating as well lately due to stress -Current exercise habits: not exercising  -Educated on Cholesterol goals;  Importance of limiting foods high in cholesterol; Exercise goal of 150 minutes per week; -Recommended to continue current medication  Pre-diabetes (A1c goal <6.5%) -Controlled -Current medications: . No medications -Medications previously tried: metformin  -Current home glucose readings: using CGM -Denies hypoglycemic/hyperglycemic symptoms -Current meal patterns:  . breakfast: n/a  . lunch: n/a  . dinner: n/a . snacks: n/a . drinks: n/a -Current exercise: not exercising currently -Educated onExercise goal of 150 minutes per week; Carbohydrate counting and/or plate method -Counseled to check feet daily and get yearly eye exams -Counseled on diet and exercise extensively  Asthma (Goal: control symptoms and prevent exacerbations) -Controlled -Current treatment   Albuterol HFA 1-2 puffs into the lungs every 4 hrs as needed  Trelegy Ellipta 1 puff into the lungs daily  -Medications previously tried: Arnuity  -Patient reports consistent use of maintenance inhaler -Frequency of rescue inhaler use: not often -Counseled on When to use rescue inhaler -Recommended use of albuterol when she is around dust Assessed patient finances. Will reach out the [employer] to see if job loss  will allow for patient assistance.   Anxiety (Goal: minimize  symptoms) -Controlled -Current treatment: . Citalopram 10 mg 0.5 tablet daily -Medications previously tried/failed: diazepam, higher dose of citalopram (drowsiness) -PHQ9: n/a -GAD7: n/a -Educated on Benefits of medication for symptom control Benefits of cognitive-behavioral therapy with or without medication -Recommended to continue current medication Patient will be utilizing hospice resources after her mom's death for counseling  Post menopausal (Goal: minimize symptoms ) -Controlled -Current treatment  . Dotti 0.05 mg/24 hr patch apply 0.5 patch onto the skin twice a week  -Medications previously tried: none  -Counseled on risks of taking estrogen therapy long term such as strokes, blood clots, and cancer  Health Maintenance -Vaccine gaps: Shingrix -Current therapy:   Vitamin D 2000 units take 1 tablet daily  Restasis 0.05% eye drop 1 drop into both eyes twice daily  Famotidine 20 mg 1 tablet daily  Nature Made 540 mg Omega-3 Fish Oil.   Turmeric 1 capsule once daily  Budesonide 32 mcg/act nasa; spray 1 spray into both nostrils daily as needed  Alive Women's Gummy 1 daily  -Educated on Cost vs benefit of each product must be carefully weighed by individual consumer -Patient is satisfied with current therapy and denies issues -Recommended to continue current medication  Patient Goals/Self-Care Activities . Over the next 180 days, patient will:  - take medications as prescribed check blood pressure at least weekly, document, and provide at future appointments target a minimum of 150 minutes of moderate intensity exercise weekly  Follow Up Plan: Telephone follow up appointment with care management team member scheduled for: 6 months       Patient verbalizes understanding of instructions provided today and agrees to view in [place].  Telephone follow up appointment with pharmacy team member scheduled  for: 6 months  Viona Gilmore, [facility]  Fat and Cholesterol Restricted Eating Plan Getting too much fat and cholesterol in your diet may cause health problems. Choosing the right foods helps keep your fat and cholesterol at normal levels. This can keep you from getting certain diseases. Your doctor may recommend an eating plan that includes:  Total fat: ______% or less of total calories a day.  Saturated fat: ______% or less of total calories a day.  Cholesterol: less than _________mg a day.  Fiber: ______g a day. What are tips for following this plan? Meal planning  At meals, divide your plate into four equal parts: ? Fill one-half of your plate with vegetables and green salads. ? Fill one-fourth of your plate with whole grains. ? Fill one-fourth of your plate with low-fat (lean) protein foods.  Eat fish that is high in omega-3 fats at least two times a week. This includes mackerel, tuna, sardines, and salmon.  Eat foods that are high in fiber, such as whole grains, beans, apples, broccoli, carrots, peas, and barley. General tips  Work with your doctor to lose weight if you need to.  Avoid: ? Foods with added sugar. ? Fried foods. ? Foods with partially hydrogenated oils.  Limit alcohol intake to no more than 1 drink a day for nonpregnant women and 2 drinks a day for men. One drink equals 12 oz of beer, 5 oz of wine, or 1 oz of hard liquor.   Reading food labels  Check food labels for: ? Trans fats. ? Partially hydrogenated oils. ? Saturated fat (g) in each serving. ? Cholesterol (mg) in each serving. ? Fiber (g) in each serving.  Choose foods with healthy fats, such as: ? Monounsaturated fats. ? Polyunsaturated fats. ? Omega-3 fats.  Choose grain products that have whole grains. Look for the word "whole" as the first word in the ingredient list. Cooking  Cook foods using low-fat methods. These include baking, boiling, grilling, and broiling.  Eat more  home-cooked foods. Eat at restaurants and buffets less often.  Avoid cooking using saturated fats, such as butter, cream, palm oil, palm kernel oil, and coconut oil. Recommended foods Fruits  All fresh, canned (in natural juice), or frozen fruits. Vegetables  Fresh or frozen vegetables (raw, steamed, roasted, or grilled). Green salads. Grains  Whole grains, such as whole wheat or whole grain breads, crackers, cereals, and pasta. Unsweetened oatmeal, bulgur, barley, quinoa, or brown rice. Corn or whole wheat flour tortillas. Meats and other protein foods  Ground beef (85% or leaner), grass-fed beef, or beef trimmed of fat. Skinless chicken or [place]. Ground chicken or [place]. Pork trimmed of fat. All fish and seafood. Egg whites. Dried beans, peas, or lentils. Unsalted nuts or seeds. Unsalted canned beans. Nut butters without added sugar or oil. Dairy  Low-fat or nonfat dairy products, such as skim or 1% milk, 2% or reduced-fat cheeses, low-fat and fat-free ricotta or cottage cheese, or plain low-fat and nonfat yogurt. Fats and oils  Tub margarine without trans fats. Light or reduced-fat mayonnaise and salad dressings. Avocado. Olive, canola, sesame, or safflower oils. The items listed above may not be a complete list of foods and beverages you can eat. Contact a dietitian for more information.   Foods to avoid Fruits  Canned fruit in heavy syrup. Fruit in cream or butter sauce. Fried fruit. Vegetables  Vegetables cooked in cheese, cream, or butter sauce. Fried vegetables. Grains  White bread. White pasta. White rice. Cornbread. Bagels, pastries, and croissants. Crackers and snack foods that contain trans fat and hydrogenated oils. Meats and other protein foods  Fatty cuts of meat. Ribs, chicken wings, bacon, sausage, bologna, salami, chitterlings, fatback, hot dogs, bratwurst, and packaged lunch meats. Liver and organ meats. Whole eggs and egg yolks. Chicken and [place] with skin.  Fried meat. Dairy  Whole or 2% milk, cream, half-and-half, and cream cheese. Whole milk cheeses. Whole-fat or sweetened yogurt. Full-fat cheeses. Nondairy creamers and whipped toppings. Processed cheese, cheese spreads, and cheese curds. Beverages  Alcohol. Sugar-sweetened drinks such as sodas, lemonade, and fruit drinks. Fats and oils  Butter, stick margarine, lard, shortening, ghee, or bacon fat. Coconut, palm kernel, and palm oils. Sweets and desserts  Corn syrup, sugars, honey, and molasses. Candy. Jam and jelly. Syrup. Sweetened cereals. Cookies, pies, cakes, donuts, muffins, and ice cream. The items listed above may not be a complete list of foods and beverages you should avoid. Contact a dietitian for more information. Summary  Choosing the right foods helps keep your fat and cholesterol at normal levels. This can keep you from getting certain diseases.  At meals, fill one-half of your plate with vegetables and green salads.  Eat high-fiber foods, like whole grains, beans, apples, carrots, peas, and barley.  Limit added sugar, saturated fats, alcohol, and fried foods. This information is not intended to replace advice given to you by your health care provider. Make sure you discuss any questions you have with your health care provider. Document Revised: 02/09/2020 Document Reviewed: 02/09/2020 Elsevier Patient Education  2021 [employer].

## 2020-12-28 ENCOUNTER — Ambulatory Visit: Payer: PPO

## 2021-01-03 ENCOUNTER — Telehealth: Payer: Self-pay | Admitting: Pharmacist

## 2021-01-03 NOTE — Chronic Care Management (AMB) (Signed)
  Chronic Care Management Pharmacy Assistant   Name: Amber Brewer  MRN: [id number] DOB: 11/10/1950  Reason for Encounter: Medication Review   Recent office visits:  None  Recent consult visits:  . 03.01.2022- Kozlow, Eric J, MD Allergy and Immunology  Hospital visits:  None in previous 6 months  Medications: Outpatient Encounter Medications as of 01/03/2021  Medication Sig  . albuterol (PROAIR HFA) 108 (90 Base) MCG/ACT inhaler Inhale 1-2 puffs into the lungs every 4 (four) hours as needed.  . budesonide (RHINOCORT AQUA) 32 MCG/ACT nasal spray Place 1 spray into both nostrils daily as needed for allergies.   . carvedilol (COREG) 12.5 MG tablet Take 1 tablet (12.5 mg total) by mouth 2 (two) times daily with a meal.  . Cholecalciferol (VITAMIN D) 50 MCG (2000 UT) tablet Take 2,000 Units by mouth daily.   . citalopram (CELEXA) 10 MG tablet Take 0.5 tablets (5 mg total) by mouth daily.  . Continuous Blood Gluc Receiver (FREESTYLE LIBRE 14 DAY READER) DEVI 1 Device by Does not apply route every 14 (fourteen) days.  . Continuous Blood Gluc Sensor (FREESTYLE LIBRE 14 DAY SENSOR) MISC 1 Device by Does not apply route every 14 (fourteen) days.  . cycloSPORINE (RESTASIS) 0.05 % ophthalmic emulsion Place 1 drop into both eyes 2 (two) times daily.  . DOTTI 0.05 MG/24HR patch APPLY 1 PATCH TOPICALLY ONTO THE SKIN 2 TIMES A WEEK (Patient taking differently: 0.5 patches. Uses 0.5 patch twice a week)  . famotidine (PEPCID) 20 MG tablet Take 1 tablet (20 mg total) by mouth daily.  . Flaxseed, Linseed, (FLAX SEED OIL) 1000 MG CAPS Take 1 capsule by mouth daily.  . Fluticasone-Umeclidin-Vilant (TRELEGY ELLIPTA) 200-62.5-25 MCG/INH AEPB Inhale 1 puff into the lungs daily.  . hydrochlorothiazide (HYDRODIURIL) 25 MG tablet Take 1 tablet (25 mg total) by mouth daily.  . irbesartan (AVAPRO) 150 MG tablet Take 2 tablets (300 mg total) by mouth daily.  . levothyroxine (SYNTHROID) 50 MCG tablet TAKE 1  TABLET(50 MCG) BY MOUTH DAILY BEFORE BREAKFAST  . Multiple Vitamins-Minerals (ALIVE WOMENS GUMMY PO) Take 1 tablet by mouth daily.  . multivitamin-lutein (OCUVITE-LUTEIN) CAPS capsule Take 1 capsule by mouth daily.  . Omega-3 Fatty Acids (FISH OIL PO) Take 540 mg by mouth daily. Nature Made Mini Omega-3  . potassium chloride (KLOR-CON) 10 MEQ tablet Take 1 tablet (10 mEq total) by mouth daily.  . rosuvastatin (CRESTOR) 10 MG tablet Take 1 tablet (10 mg total) by mouth daily. (Patient taking differently: Take 10 mg by mouth once a week.)  . triamcinolone (NASACORT) 55 MCG/ACT AERO nasal inhaler Place 1 spray into the nose daily.  . TURMERIC PO Take 500 mg by mouth daily.   . Zinc 100 MG TABS Take 1 tablet by mouth daily.   No facility-administered encounter medications on file as of 01/03/2021.   Reviewed chart for medication changes ahead of medication coordination call.  BP Readings from Last 3 Encounters:  12/19/20 136/70  11/04/20 (!) 150/80  09/28/20 128/76    Lab Results  Component Value Date   HGBA1C 5.7 (A) 09/28/2020     Patient obtains medications through Adherence Packaging  90 Days  Last adherence delivery included:  . Carvedilol 12.5 mg: One tablet twice daily at breakfast and dinner . Citalopram 10 mg:  tablet daily at breakfast   . Famotidine 20 mg: One tablet daily at dinner  . Rosuvastatin 10 mg: One tablet every Monday night . Potassium Chloride 10 MEQ: One   tablet daily at lunch . Vitamin D3 2000 units daily: one tablet daily at dinner . Turmeric 500 mg: One tablet daily at dinner . Fish Oil 540 mg daily (Nature Made Mini Omega) daily at lunch . [facility] Adult Multivitamin 50+ 1 capsule daily -Breakfast . Levothyroxine 50 MCG: one tablet daily . Hydrochlorothiazide 25 MG tablet: one tablet at lunch . Irbesartan 150 mg: One tablet twice daily at breakfast and dinner . Trelegy Ellipta 200-62.5-25: one puff into lung daily . Freestyle Libre 14-day Sensor  kit: use as directed  Patient declined the following  last month due to PRN use/additional supply on hand. . Rhinocort Allergy 32 mcg/act -PRN . Albuterol (PROAIR HFA) 108 (90 Base) MCG/ACT inhaler . DOTTI 0.05 MG/24HR patch  Patient is due for next adherence delivery on: 01/16/2021. Called patient and reviewed medications and coordinated delivery.  This delivery to include:30 day supply . Irbesartan 150 mg: One tablet twice daily at breakfast and dinner . Trelegy Ellipta 200-62.5-25: one puff into lung daily . Freestyle Libre 14-day Sensor kit: use as directed  I spoke with the patient and review medications. There are no changes in medications currently. Patient declined these medication this month due to PRN use/additional supply on hand.The patient is taking the following medications: 90 day supply sent on 12/12/2020. . Carvedilol 12.5 mg: One tablet twice daily at breakfast and dinner . Citalopram 10 mg:  tablet daily at breakfast   . Famotidine 20 mg: One tablet daily at dinner  . Rosuvastatin 10 mg: One tablet every Monday night . Potassium Chloride 10 MEQ: One tablet daily at lunch . Vitamin D3 2000 units daily: one tablet daily at dinner . Turmeric 500 mg: One tablet daily at dinner . Fish Oil 540 mg daily (Nature Made Mini Omega) daily at lunch . [facility] Adult Multivitamin 50+ 1 capsule daily -Breakfast . Levothyroxine 50 MCG: one tablet daily . Hydrochlorothiazide 25 MG tablet: one tablet at lunch . Rhinocort Allergy 32 mcg/act -PRN . Albuterol (PROAIR HFA) 108 (90 Base) MCG/ACT inhaler . DOTTI 0.05 MG/24HR patch  She currently does not need refills. Confirmed delivery date of 01/16/2021, advised patient that pharmacy will contact them the morning of delivery.  Star Rating Drugs:  Dispensed Quantity Pharmacy  Rosuvastatin 10 mg 11.25.2021 [facility]  Irbesartan 150 mg 02.24.2022 [facility]   Amilia (Mimi) Frazier, CMA Clinical Pharmacy  Assistant [phone]  

## 2021-01-15 ENCOUNTER — Other Ambulatory Visit: Payer: Self-pay | Admitting: Internal Medicine

## 2021-01-16 ENCOUNTER — Ambulatory Visit: Payer: PPO | Admitting: Internal Medicine

## 2021-01-16 ENCOUNTER — Encounter: Payer: Self-pay | Admitting: Internal Medicine

## 2021-01-16 ENCOUNTER — Other Ambulatory Visit: Payer: Self-pay

## 2021-01-16 VITALS — BP 142/73 | HR 58 | Ht 64.0 in | Wt 186.6 lb

## 2021-01-16 DIAGNOSIS — E782 Mixed hyperlipidemia: Secondary | ICD-10-CM

## 2021-01-16 DIAGNOSIS — I493 Ventricular premature depolarization: Secondary | ICD-10-CM | POA: Diagnosis not present

## 2021-01-16 DIAGNOSIS — L989 Disorder of the skin and subcutaneous tissue, unspecified: Secondary | ICD-10-CM | POA: Diagnosis not present

## 2021-01-16 DIAGNOSIS — I10 Essential (primary) hypertension: Secondary | ICD-10-CM

## 2021-01-16 DIAGNOSIS — L7211 Pilar cyst: Secondary | ICD-10-CM | POA: Diagnosis not present

## 2021-01-16 NOTE — Progress Notes (Signed)
OFFICE NOTE  Chief Complaint:  Follow-up  Primary Care Physician: [name], NP  HPI:  Amber Brewer is a 70 y.o. female with a past medical history significant for hypertension, dyslipidemia, asthma/allergies, GERD, Schatzki's ring and prior esophageal dilatation about 5 years ago.  She now presents with recent episodes of squeezing in the chest which feel episodic and spasmodic. She is concerned that this may be esophageal spasm however her primary care provider wanted to rule out possible coronary disease. She feels a tightness in her chest that is not necessarily associated with eating or present with exertion or relieved by rest. She is also short of breath, particularly when walking up hills. The tightness is been coming on more recently. She's also been under significant stress and does have some history of anxiety. She is caring for disabled son and has an 32 year old mother who she is caring for. 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 She reports that she does have some mild trouble swallowing and occasionally food does get stuck in her mid throat and often times pills are difficult to swallow and may come up. She also reported a brief presyncopal episode however this is with significantly rotating her head about 90 the cause that quick decrease in vision which improved when straightening her head out. She does have a history of neck injury in the past.  02/26/2016  Amber Brewer returns today for follow-up of her nuclear stress test. This is an exercise study in which no ischemia was noted. LVEF was greater than 65%. She did have a hypertensive response to exercise with a peak systolic blood pressure 123456. Blood pressure appears that is not as well-controlled as it should be. We discussed her chest discomfort which she says she is had some improvement in. Given her prior esophageal dilatation history and Schatzki's ring, she may be having esophageal  spasm. I asked her to follow-up with her gastroenterologist about this. She may need manometry. If this is the case then she may benefit from addition of low-dose calcium channel blocker. She does not want to add any extra medication at this time, however blood pressure is suboptimally controlled.  05/27/2016  Amber Brewer returns today for follow-up. She continues to have some anxiety episodes. This is associated with some throat tightness. She also has a history of esophageal stricture and Schatzki's ring. She has made an appointment with her gastroenterologist but that is not until September. It's unclear whether these episodes are GI related or anxiety. I recently increased her Bystolic to 7.5 mg daily. Her home blood pressure readings indicate good control.  11/08/2015  Amber Brewer was seen today as an add-on for feeling faint and having palpitations. An EKG in the office shows frequent unifocal PVCs. This is symptomatically similar to what she had when I saw her back in August however she is having more frequent episodes. She is also under significant stress. Her husband recently had intracranial hemorrhage and had emergency brain surgery. She also takes care of her elderly mother and disabled child. This causes a lot of stress in her life. She's had asthma however she is on inhaled steroid and rarely uses a beta agonist. She is on Bystolic however that has very poor ability to block PVCs. LV function was normal this past year in April 2017 by stress test without any ischemia, therefore not concerned about these being ischemic PVCs.  11/11/2016  Amber Brewer returns today for follow-up. She reports  symptomatic improvement in her palpitations on carvedilol. Blood pressure is low normal. She was getting a little dizzy and move her blood pressure medicines to the middle of the day. She reports about 50% improvement in her palpitations. She says she is breathing better and has more  energy.  12/19/2016  Amber Brewer is seen today in follow-up. Overall she feels much better. She is actually made adjustments or other medication at home. She felt like a higher dose of carvedilol was robbing her of energy. Not quite as bad as being on diastolic but certainly worse. She therefore decreased the dose and currently is only taking 12.5 mg every morning. She subsequently increase the dose of her irbesartan up to 300 mg daily. Blood pressure control is been excellent with most readings between 629 and 476 systolic. She denies any worsening palpitations. She is generally pleased with how she feels on this current medication regimen. I did report to her that carvedilol is typically twice daily medicine because of the short half-life and we discussed the possibility of dividing the dose twice daily.  06/17/2017  Amber Brewer is seen today in follow-up. She reports improvement in her palpitations on carvedilol however they're not as well-controlled as they have been in the past. She's previously been on an increased dose of the carvedilol and felt that it was robbing her energy. Is difficult to find a balance between the 2 medications. She's been struggling with vestibular problems and is undergoing neuro physical therapy. She says this is been helpful for her.  01/16/2021  Amber Brewer returns today for follow-up.  I saw her last via telemedicine visit.  I had recommended increasing her irbesartan to 300 mg daily.  She notes that at home her blood pressures generally in the 546T 035 systolic range although was more elevated today 142/73.  She also has lost a significant amount of weight.  Some of this was intentional and some of it not because she had COVID-19 in January and she was taking care of her mother who ultimately died of that.  Her cholesterol however is come down substantially.  Total was 154, triglycerides 67 HDL 56 and LDL of 73, this is down from 171 previously.  She was taken the  rosuvastatin 10 mg daily but was advised by her PCP to drop that back to once weekly.  I do think we could cut back on it however I would be hesitant to say that this was all from diet and it will likely increase with a significant reduction in her rosuvastatin.  PMHx:  Past Medical History:  Diagnosis Date  . Asthma   . Chicken pox   . DDD (degenerative disc disease), cervical   . Depression   . Gallstones   . GERD (gastroesophageal reflux disease)   . Hx of melanoma in situ 2002  . Hx: UTI (urinary tract infection)   . Hyperlipidemia   . Hypertension   . Hypothyroidism   . IBS (irritable bowel syndrome)   . Melanoma ([place])    x2   . Nephrolithiasis   . Osteoarthritis   . PVC's (premature ventricular contractions)   . Status post dilation of esophageal narrowing     Past Surgical History:  Procedure Laterality Date  . ABDOMINAL HYSTERECTOMY  1974  . APPENDECTOMY  1977  . BLADDER SURGERY  1974  . BREAST EXCISIONAL BIOPSY    . CATARACT EXTRACTION Bilateral 2015  . CHOLECYSTECTOMY  1987  . removal of melanoma in situ  FAMHx:  Family History  Problem Relation Age of Onset  . Hypertension Mother   . Thyroid disease Mother   . Arthritis Mother   . Depression Mother   . Hearing loss Mother   . Miscarriages / [place] Mother   . Heart attack Father   . Heart disease Father   . Alcohol abuse Father   . Arthritis Father   . Depression Father   . Drug abuse Father   . Hypertension Father   . Alcohol abuse Sister   . Arthritis Sister   . Depression Sister   . Hearing loss Sister   . Hypotension Sister   . Hypertension Maternal Aunt   . Hypertension Maternal Uncle   . Clotting disorder Maternal Uncle   . Prostate cancer Maternal Uncle   . Colon cancer Maternal Uncle   . Colon polyps Maternal Uncle   . Diabetes Maternal Uncle        all uncles  . Arthritis Maternal Grandmother   . Asthma Maternal Grandmother   . Breast cancer Maternal Grandmother   .  Stomach cancer Maternal Grandmother   . Hearing loss Maternal Grandmother   . Diabetes Maternal Grandfather   . Heart disease Maternal Grandfather   . Arthritis Maternal Grandfather   . Hearing loss Maternal Grandfather   . Hypertension Maternal Grandfather   . Kidney disease Maternal Grandfather   . Hyperlipidemia Paternal Grandmother        also MI  . Breast cancer Paternal Grandmother   . Arthritis Paternal Grandmother   . Heart attack Paternal Grandmother   . Hyperlipidemia Paternal Grandfather        also MI  . Heart disease Paternal Grandfather   . Heart attack Paternal Grandfather   . Depression Sister   . Heart disease Sister   . Hyperlipidemia Sister   . Hypertension Son   . Endometriosis Daughter     SOCHx:   reports that she has never smoked. She has never used smokeless tobacco. She reports that she does not drink alcohol and does not use drugs.  ALLERGIES:  Allergies  Allergen Reactions  . Codeine Phosphate Nausea And Vomiting    REACTION: unspecified  . Prednisone Shortness Of Breath  . Betadine [Povidone Iodine] Hives    After surgical skin prep  . Sulfa Antibiotics Nausea And Vomiting  . Iodine Rash    Broke out on skin once years ago--**Betadine Skin Prep**    ROS: Pertinent items noted in HPI and remainder of comprehensive ROS otherwise negative.  HOME MEDS: Current Outpatient Medications  Medication Sig Dispense Refill  . albuterol (PROAIR HFA) 108 (90 Base) MCG/ACT inhaler Inhale 1-2 puffs into the lungs every 4 (four) hours as needed. 54 g 0  . budesonide (RHINOCORT AQUA) 32 MCG/ACT nasal spray Place 1 spray into both nostrils daily as needed for allergies.     . carvedilol (COREG) 12.5 MG tablet Take 1 tablet (12.5 mg total) by mouth 2 (two) times daily with a meal. 180 tablet 1  . Cholecalciferol (VITAMIN D) 50 MCG (2000 UT) tablet Take 2,000 Units by mouth daily.     . citalopram (CELEXA) 10 MG tablet Take 0.5 tablets (5 mg total) by mouth  daily. 45 tablet 0  . Continuous Blood Gluc Receiver (FREESTYLE LIBRE 14 DAY READER) DEVI 1 Device by Does not apply route every 14 (fourteen) days. 6 each 3  . Continuous Blood Gluc Sensor (FREESTYLE LIBRE 14 DAY SENSOR) MISC 1 Device by Does not apply  route every 14 (fourteen) days. 6 each 3  . cycloSPORINE (RESTASIS) 0.05 % ophthalmic emulsion Place 1 drop into both eyes 2 (two) times daily.    . DOTTI 0.05 MG/24HR patch APPLY 1 PATCH TOPICALLY ONTO THE SKIN 2 TIMES A WEEK (Patient taking differently: 0.5 patches. Uses 0.5 patch twice a week) 24 patch 2  . famotidine (PEPCID) 20 MG tablet Take 1 tablet (20 mg total) by mouth daily. 90 tablet 1  . Flaxseed, Linseed, (FLAX SEED OIL) 1000 MG CAPS Take 1 capsule by mouth daily.    . Fluticasone-Umeclidin-Vilant (TRELEGY ELLIPTA) 200-62.5-25 MCG/INH AEPB Inhale 1 puff into the lungs daily. 180 each 1  . hydrochlorothiazide (HYDRODIURIL) 25 MG tablet Take 1 tablet (25 mg total) by mouth daily. 90 tablet 2  . irbesartan (AVAPRO) 150 MG tablet Take 2 tablets (300 mg total) by mouth daily. 90 tablet 0  . levothyroxine (SYNTHROID) 50 MCG tablet TAKE 1 TABLET(50 MCG) BY MOUTH DAILY BEFORE BREAKFAST 90 tablet 2  . Multiple Vitamins-Minerals (ALIVE WOMENS GUMMY PO) Take 1 tablet by mouth daily.    . multivitamin-lutein (OCUVITE-LUTEIN) CAPS capsule Take 1 capsule by mouth daily.    . Omega-3 Fatty Acids (FISH OIL PO) Take 540 mg by mouth daily. Nature Made Mini Omega-3    . potassium chloride (KLOR-CON) 10 MEQ tablet Take 1 tablet (10 mEq total) by mouth daily. 90 tablet 3  . rosuvastatin (CRESTOR) 10 MG tablet Take 10 mg by mouth every other day.    . triamcinolone (NASACORT) 55 MCG/ACT AERO nasal inhaler Place 1 spray into the nose daily. 1 each 12  . TURMERIC PO Take 500 mg by mouth daily.     . Zinc 100 MG TABS Take 1 tablet by mouth daily.     No current facility-administered medications for this visit.    LABS/IMAGING: No results found for this  or any previous visit (from the past 48 hour(s)). No results found.  WEIGHTS: Wt Readings from Last 3 Encounters:  01/16/21 186 lb 9.6 oz (84.6 kg)  12/19/20 185 lb 9.6 oz (84.2 kg)  09/28/20 184 lb 6.4 oz (83.6 kg)    VITALS: BP (!) 142/73   Pulse (!) 58   Ht 5\' 4"  (1.626 m)   Wt 186 lb 9.6 oz (84.6 kg)   SpO2 98%   BMI 32.03 kg/m   EXAM: General appearance: alert and no distress Neck: no carotid bruit and no JVD Lungs: clear to auscultation bilaterally Heart: regular rate and rhythm, S1, S2 normal, no murmur, click, rub or gallop Abdomen: soft, non-tender; bowel sounds normal; no masses,  no organomegaly Extremities: extremities normal, atraumatic, no cyanosis or edema Pulses: 2+ and symmetric Skin: Skin color, texture, turgor normal. No rashes or lesions Neurologic: Grossly normal Psych: Pleasant  EKG: Sinus bradycardia 58, nonspecific ST changes-personally reviewed  ASSESSMENT: 1. Frequent symptomatic PVC's - improved 2. Chest pain, more likely esophageal spasm - low risk exercise nuclear stress test (2017) 3. Progressive dyspnea on exertion - improved 4. Hypertension - controlled 5. Dyslipidemia 6. Neuro-vestibular imbalance  PLAN: 1.  Mrs. Mani has had better blood pressure control after increasing irbesartan now to 300 mg daily.  She denies any significant recurrent PVCs.  Her lipids are better now with LDL in the 70s down from 170 and she has had significant recent weight loss.  I have encouraged her to eat healthy and continue to maintain that.  She should be okay with decreasing her rosuvastatin to 10 mg every  other day rather than once weekly.  Follow-up with me in 6 months or sooner as necessary  Pixie Casino, MD, [facility] [place] Director of the Advanced Lipid Disorders &  Cardiovascular Risk Reduction Clinic Diplomate of the American Board of Clinical Lipidology Attending Cardiologist  Direct Dial: [phone]   Fax: [phone]  Website:  [email or web address] 01/16/2021, 1:07 PM

## 2021-01-16 NOTE — Patient Instructions (Signed)
Medication Instructions:  Dr. Debara Pickett advised that you take crestor 10mg  every other day   *If you need a refill on your cardiac medications before your next appointment, please call your pharmacy*   Lab Work: FASTING lab work in 6 months to check cholesterol   If you have labs (blood work) drawn today and your tests are completely normal, you will receive your results only by: Marland Kitchen MyChart Message (if you have MyChart) OR . A paper copy in the mail If you have any lab test that is abnormal or we need to change your treatment, we will call you to review the results.   Testing/Procedures: NONE   Follow-Up: At [facility], you and your health needs are our priority.  As part of our continuing mission to provide you with exceptional heart care, we have created designated Provider Care Teams.  These Care Teams include your primary Cardiologist (physician) and Advanced Practice Providers (APPs -  Physician Assistants and Nurse Practitioners) who all work together to provide you with the care you need, when you need it.  We recommend signing up for the patient portal called "MyChart".  Sign up information is provided on this After Visit Summary.  MyChart is used to connect with patients for Virtual Visits (Telemedicine).  Patients are able to view lab/test results, encounter notes, upcoming appointments, etc.  Non-urgent messages can be sent to your provider as well.   To learn more about what you can do with MyChart, go to [email or web address].    Your next appointment:   6 month(s)  The format for your next appointment:   In Person  Provider:   You may see Dr. Debara Pickett or one of the following Advanced Practice Providers on your designated Care Team:    Almyra Deforest, PA-C  Fabian Sharp, [place] or   Roby Lofts, [place]    Other Instructions

## 2021-02-08 ENCOUNTER — Telehealth: Payer: Self-pay | Admitting: Pharmacist

## 2021-02-09 NOTE — Chronic Care Management (AMB) (Signed)
Chronic Care Management Pharmacy Assistant   Name: Amber Brewer  MRN: [id number] DOB: [birth year]  Reason for Encounter: Medication Review/ Medication Coordination Call   Recent office visits:  None  Recent consult visits:  01/16/2021- Dr. Lyman Brewer (Cardiology)  Hospital visits:  None in previous 6 months   Reviewed chart for medication changes ahead of medication coordination call.  Medication changes indicated: Rosuvastatin 10 mg directions changed from once a week to one tablet every other day.   BP Readings from Last 3 Encounters:  01/16/21 (!) 142/73  12/19/20 136/70  11/04/20 (!) 150/80    Lab Results  Component Value Date   HGBA1C 5.7 (A) 09/28/2020     Patient obtains medications through Adherence Packaging  90 Days   Last adherence delivery included:   Irbesartan 150 mg: One tablet twice daily at breakfast and dinner  Trelegy Ellipta 200-62.5-25: one puff into lung daily  Freestyle Libre 14-day Sensor kit: use as directed  Patient declined the following medication last month:  Carvedilol 12.5 mg: One tablet twice daily at breakfast and dinner  Citalopram 10 mg:  tablet daily at breakfast    Famotidine 20 mg: One tablet daily at dinner   Rosuvastatin 10 mg: One tablet every Monday night  Potassium Chloride 10 MEQ: One tablet daily at lunch  Vitamin D3 2000 units daily: one tablet daily at dinner  Turmeric 500 mg: One tablet daily at dinner  Fish Oil 540 mg daily (Nature Made Mini Omega) daily at lunch  [place] Adult Multivitamin 50+ 1 capsule daily -Breakfast  Levothyroxine 50 MCG: one tablet daily  Hydrochlorothiazide 25 MG tablet: one tablet at lunch  Rhinocort Allergy 32 mcg/act -PRN  Albuterol (PROAIR HFA) 108 (90 Base) MCG/ACT inhaler  DOTTI 0.05 MG/24HR patch   Patient is due for next adherence delivery on: 02/13/2021. Called patient and reviewed medications and coordinated delivery.  This delivery to  include:  Irbesartan 150 mg: One tablet twice daily at breakfast and dinner  Trelegy Ellipta 200-62.5-25: one puff into lung daily  Freestyle Libre 14-day Sensor kit: use as directed  Rhinocort Allergy 32 mcg/act -PRN  No Short fill or acute fill needed.  Patient declined the following medications:  Carvedilol 12.5 mg: One tablet twice daily at breakfast and dinner  Citalopram 10 mg:  tablet daily at breakfast    Famotidine 20 mg: One tablet daily at dinner   Rosuvastatin 10 mg: One tablet every Monday night  Potassium Chloride 10 MEQ: One tablet daily at lunch  Vitamin D3 2000 units daily: one tablet daily at dinner  Turmeric 500 mg: One tablet daily at dinner  Fish Oil 540 mg daily (Nature Made Mini Omega) daily at lunch  [facility] Adult Multivitamin 50+ 1 capsule daily -Breakfast  Levothyroxine 50 MCG: one tablet daily  Hydrochlorothiazide 25 MG tablet: one tablet at lunch  Albuterol (PROAIR HFA) 108 (90 Base) MCG/ACT inhaler  DOTTI 0.05 MG/24HR patch   Patient needs refills for Irbesartan- request sent by Pharmacy.  Confirmed delivery date of 02/13/2021, advised patient that pharmacy will contact them the morning of delivery.  Medications: Outpatient Encounter Medications as of 02/08/2021  Medication Sig  . albuterol (PROAIR HFA) 108 (90 Base) MCG/ACT inhaler Inhale 1-2 puffs into the lungs every 4 (four) hours as needed.  . budesonide (RHINOCORT AQUA) 32 MCG/ACT nasal spray Place 1 spray into both nostrils daily as needed for allergies.   . carvedilol (COREG) 12.5 MG tablet Take 1  tablet (12.5 mg total) by mouth 2 (two) times daily with a meal.  . Cholecalciferol (VITAMIN D) 50 MCG (2000 UT) tablet Take 2,000 Units by mouth daily.   . citalopram (CELEXA) 10 MG tablet Take 0.5 tablets (5 mg total) by mouth daily.  . Continuous Blood Gluc Receiver (FREESTYLE LIBRE 14 DAY READER) DEVI 1 Device by Does not apply route every 14 (fourteen) days.  .  Continuous Blood Gluc Sensor (FREESTYLE LIBRE 14 DAY SENSOR) MISC 1 Device by Does not apply route every 14 (fourteen) days.  . cycloSPORINE (RESTASIS) 0.05 % ophthalmic emulsion Place 1 drop into both eyes 2 (two) times daily.  . DOTTI 0.05 MG/24HR patch APPLY 1 PATCH TOPICALLY ONTO THE SKIN 2 TIMES A WEEK (Patient taking differently: 0.5 patches. Uses 0.5 patch twice a week)  . famotidine (PEPCID) 20 MG tablet Take 1 tablet (20 mg total) by mouth daily.  . Flaxseed, Linseed, (FLAX SEED OIL) 1000 MG CAPS Take 1 capsule by mouth daily.  . Fluticasone-Umeclidin-Vilant (TRELEGY ELLIPTA) 200-62.5-25 MCG/INH AEPB Inhale 1 puff into the lungs daily.  . hydrochlorothiazide (HYDRODIURIL) 25 MG tablet Take 1 tablet (25 mg total) by mouth daily.  Marland Kitchen levothyroxine (SYNTHROID) 50 MCG tablet TAKE 1 TABLET(50 MCG) BY MOUTH DAILY BEFORE BREAKFAST  . Multiple Vitamins-Minerals (ALIVE WOMENS GUMMY PO) Take 1 tablet by mouth daily.  . multivitamin-lutein (OCUVITE-LUTEIN) CAPS capsule Take 1 capsule by mouth daily.  . Omega-3 Fatty Acids (FISH OIL PO) Take 540 mg by mouth daily. Nature Made Mini Omega-3  . potassium chloride (KLOR-CON) 10 MEQ tablet Take 1 tablet (10 mEq total) by mouth daily.  . rosuvastatin (CRESTOR) 10 MG tablet Take 10 mg by mouth every other day.  . triamcinolone (NASACORT) 55 MCG/ACT AERO nasal inhaler Place 1 spray into the nose daily.  . TURMERIC PO Take 500 mg by mouth daily.   . Zinc 100 MG TABS Take 1 tablet by mouth daily.  . [DISCONTINUED] irbesartan (AVAPRO) 150 MG tablet Take 2 tablets (300 mg total) by mouth daily.   No facility-administered encounter medications on file as of 02/08/2021.    Star Rating Drugs: Irbesartan 150 mg- Last filled 01/16/2021 for 30 day supply at [place] Rosuvastatin 10 mg- Last filled 09/14/2020 for 90 day supply at [place] Metformin 500 mg- Last filled 03/29/2020 for 90 day supply at [facility].  Notes: Patient states blood  sugars are doing very good, fasting blood sugar reading today at 87. Patient is checking blood pressures at home. Last reading 127/70.   SIG: Amber Brewer, [place] [phone]

## 2021-02-12 ENCOUNTER — Telehealth: Payer: Self-pay | Admitting: Internal Medicine

## 2021-02-12 MED ORDER — IRBESARTAN 150 MG PO TABS: 300.0000 mg | ORAL_TABLET | Freq: Every day | ORAL | 1 refills | Status: DC

## 2021-02-12 NOTE — Telephone Encounter (Signed)
-----   Message from Pixie Casino, MD sent at 02/12/2021 11:20 AM EDT ----- Regarding: FW: Medication Refill Can you send this in for me? Thx  DR. H  ----- Message ----- From: Dondra Prader Sent: 02/12/2021  10:48 AM EDT To: Pixie Casino, MD Subject: Medication Refill                              Good  Morning,  Can you please send the following prescription (s) to [facility]?   Irbesartan (AVAPRO) 150 MG tablet     If the provider does not write this medication, please let me know.   Have a wonderful day.  [name], [name] [phone]

## 2021-02-12 NOTE — Telephone Encounter (Signed)
Rx(s) sent to pharmacy electronically.  

## 2021-02-16 ENCOUNTER — Other Ambulatory Visit: Payer: Self-pay

## 2021-02-16 ENCOUNTER — Ambulatory Visit
Admission: RE | Admit: 2021-02-16 | Discharge: 2021-02-16 | Disposition: A | Discharge status: Home / Self Care | Payer: PPO | Source: Ambulatory Visit | Attending: Adult Health | Admitting: Adult Health

## 2021-02-16 DIAGNOSIS — Z1231 Encounter for screening mammogram for malignant neoplasm of breast: Secondary | ICD-10-CM | POA: Diagnosis not present

## 2021-02-26 ENCOUNTER — Telehealth: Payer: Self-pay | Admitting: Pharmacist

## 2021-02-26 NOTE — Chronic Care Management (AMB) (Signed)
Chronic Care Management Pharmacy Assistant   Name: Amber Brewer  MRN: [id number] DOB: 04-08-1951  Reason for Encounter: Disease State and Medication Review   Conditions to be addressed/monitored: HLD  Recent office visits:  None  Recent consult visits:  None  Hospital visits:  None in previous 6 months  Medications: Outpatient Encounter Medications as of 02/26/2021  Medication Sig  . albuterol (PROAIR HFA) 108 (90 Base) MCG/ACT inhaler Inhale 1-2 puffs into the lungs every 4 (four) hours as needed.  . budesonide (RHINOCORT AQUA) 32 MCG/ACT nasal spray Place 1 spray into both nostrils daily as needed for allergies.   . carvedilol (COREG) 12.5 MG tablet Take 1 tablet (12.5 mg total) by mouth 2 (two) times daily with a meal.  . Cholecalciferol (VITAMIN D) 50 MCG (2000 UT) tablet Take 2,000 Units by mouth daily.   . citalopram (CELEXA) 10 MG tablet Take 0.5 tablets (5 mg total) by mouth daily.  . Continuous Blood Gluc Receiver (FREESTYLE LIBRE 14 DAY READER) DEVI 1 Device by Does not apply route every 14 (fourteen) days.  . Continuous Blood Gluc Sensor (FREESTYLE LIBRE 14 DAY SENSOR) MISC 1 Device by Does not apply route every 14 (fourteen) days.  . cycloSPORINE (RESTASIS) 0.05 % ophthalmic emulsion Place 1 drop into both eyes 2 (two) times daily.  . DOTTI 0.05 MG/24HR patch APPLY 1 PATCH TOPICALLY ONTO THE SKIN 2 TIMES A WEEK (Patient taking differently: 0.5 patches. Uses 0.5 patch twice a week)  . famotidine (PEPCID) 20 MG tablet Take 1 tablet (20 mg total) by mouth daily.  . Flaxseed, Linseed, (FLAX SEED OIL) 1000 MG CAPS Take 1 capsule by mouth daily.  . Fluticasone-Umeclidin-Vilant (TRELEGY ELLIPTA) 200-62.5-25 MCG/INH AEPB Inhale 1 puff into the lungs daily.  . hydrochlorothiazide (HYDRODIURIL) 25 MG tablet Take 1 tablet (25 mg total) by mouth daily.  . irbesartan (AVAPRO) 150 MG tablet Take 2 tablets (300 mg total) by mouth daily.  Marland Kitchen levothyroxine (SYNTHROID) 50 MCG tablet  TAKE 1 TABLET(50 MCG) BY MOUTH DAILY BEFORE BREAKFAST  . Multiple Vitamins-Minerals (ALIVE WOMENS GUMMY PO) Take 1 tablet by mouth daily.  . multivitamin-lutein (OCUVITE-LUTEIN) CAPS capsule Take 1 capsule by mouth daily.  . Omega-3 Fatty Acids (FISH OIL PO) Take 540 mg by mouth daily. Nature Made Mini Omega-3  . potassium chloride (KLOR-CON) 10 MEQ tablet Take 1 tablet (10 mEq total) by mouth daily.  . rosuvastatin (CRESTOR) 10 MG tablet Take 10 mg by mouth every other day.  . triamcinolone (NASACORT) 55 MCG/ACT AERO nasal inhaler Place 1 spray into the nose daily.  . TURMERIC PO Take 500 mg by mouth daily.   . Zinc 100 MG TABS Take 1 tablet by mouth daily.   No facility-administered encounter medications on file as of 02/26/2021.   Reviewed chart for medication changes ahead of medication coordination call. No OVs, Consults, or hospital visits since last care coordination call/Pharmacist visit.  No medication changes indicated.  BP Readings from Last 3 Encounters:  01/16/21 (!) 142/73  12/19/20 136/70  11/04/20 (!) 150/80    Lab Results  Component Value Date   HGBA1C 5.7 (A) 09/28/2020     Patient obtains medications through Adherence Packaging  90 Days  Last adherence delivery included:  . Irbesartan 150 mg: Take 2 tablets by mouth daily  . Trelegy Ellipta 200-62.5-25: one puff into lung daily . Freestyle Libre 14-day Sensor kit: use as directed . Rhinocort Allergy 32 mcg/act -PRN  Patient declined the following medication  last month due to PRN use/additional supply on hand. . Carvedilol 12.5 mg: One tablet twice daily at breakfast and dinner . Citalopram 10 mg:  tablet daily at breakfast   . Famotidine 20 mg: One tablet daily at dinner  . Rosuvastatin 10 mg: One tablet every Monday night . Potassium Chloride 10 MEQ: One tablet daily at lunch . Vitamin D3 2000 units daily: one tablet daily at dinner . Turmeric 500 mg: One tablet daily at dinner . Fish Oil 540 mg daily  (Nature Made Mini Omega) daily at lunch . [place] Adult Multivitamin 50+ 1 capsule daily -Breakfast . Levothyroxine 50 MCG: one tablet daily . Hydrochlorothiazide 25 MG tablet: one tablet at lunch . Albuterol (PROAIR HFA) 108 (90 Base) MCG/ACT inhaler . DOTTI 0.05 MG/24HR patch  Patient is due for next adherence delivery on: 03/12/2021. Called patient and reviewed medications and coordinated delivery. This delivery to include: . Carvedilol 12.5 mg: One tablet twice daily at breakfast and dinner . Citalopram 10 mg:  tablet daily at breakfast   . Famotidine 20 mg: One tablet daily at dinner  . Rosuvastatin 10 mg: One tablet every Monday night . Potassium Chloride 10 MEQ: One tablet daily at lunch . Vitamin D3 2000 units daily: one tablet daily at dinner . Turmeric 500 mg: One tablet daily at dinner . Fish Oil 540 mg daily (Nature Made Mini Omega) daily at lunch . [place] Adult Multivitamin 50+ 1 capsule daily -Breakfast . Levothyroxine 50 MCG: one tablet daily . Hydrochlorothiazide 25 MG tablet: one tablet at lunch . Albuterol (PROAIR HFA) 108 (90 Base) MCG/ACT inhaler . DOTTI 0.05 MG/24HR patch . Irbesartan 150 mg: Take 2 tablets by mouth daily  . Trelegy Ellipta 200-62.5-25: one puff into lung daily . Freestyle Libre 14-day Sensor kit: use as directed  Patient declined the following medications due to PRN use. .  Rhinocort Allergy 32 mcg/act   She currently does not need refills.  Confirmed delivery date of 03/12/2021, advised patient that pharmacy will contact them the morning of delivery.  02/26/2021 Name: Amber Brewer MRN: [id number] DOB: 1951-04-27 Amber Brewer is a 70 y.o. year old female who is a primary care patient of [name], NP.  Comprehensive medication review performed; Spoke to patient regarding cholesterol  Lipid Panel    Component Value Date/Time   CHOL 154 09/28/2020 1007   TRIG 56 09/28/2020 1007   HDL 67 09/28/2020 1007    LDLCALC 73 09/28/2020 1007   LDLDIRECT 143.5 01/15/2011 0824    10-year ASCVD risk score: The 10-year ASCVD risk score Mikey Bussing DC Brooke Bonito., et al., 2013) is: 13.7%   Values used to calculate the score:     Age: 57 years     Sex: Female     Is Non-Hispanic African American: No     Diabetic: No     Tobacco smoker: No     Systolic Blood Pressure: 492 mmHg     Is BP treated: Yes     HDL Cholesterol: 67 mg/dL     Total Cholesterol: 154 mg/dL  . Current antihyperlipidemic regimen:   Rosuvastatin 10 mg 1 tablet once weekly . Previous antihyperlipidemic medications tried: None . ASCVD risk enhancing conditions: HTN . What recent interventions/DTPs have been made by any provider to improve Cholesterol control since last CPP Visit: None . Any recent hospitalizations or ED visits since last visit with CPP? No . What diet changes have been made to improve Cholesterol?  o  Increased water intake and decreased fried food . What exercise is being done to improve Cholesterol?  o No Changes  Adherence Review: Does the patient have >5 day gap between last estimated fill dates? No  I spoke with the patient and discussed medication inherence. She has not had any changes to her medications. She states that she has been doing well recently. She has increased her vegetable and fruit intake. She has not changed any of her exercise routines. She is not having any side effects from her current medications. There have been no urgent care or emergency department visits since her last PCP or CPP visit. She is part of [facility].  Star Rating Drugs:  Dispensed Quantity Pharmacy  Rosuvastatin 10 mg tablet 11.25.2021 90 [facility]  Irbesartan 150 mg 04.25.2022 60 [facility]   Amilia Revonda Standard, [place] [phone]

## 2021-03-02 ENCOUNTER — Other Ambulatory Visit: Payer: Self-pay | Admitting: Adult Health

## 2021-03-02 ENCOUNTER — Other Ambulatory Visit: Payer: Self-pay | Admitting: Internal Medicine

## 2021-04-02 ENCOUNTER — Ambulatory Visit (INDEPENDENT_AMBULATORY_CARE_PROVIDER_SITE_OTHER): Payer: PPO

## 2021-04-02 ENCOUNTER — Other Ambulatory Visit: Payer: Self-pay

## 2021-04-02 VITALS — BP 124/74 | HR 63 | Temp 98.0°F | Ht 64.0 in | Wt 194.0 lb

## 2021-04-02 DIAGNOSIS — Z0000 Encounter for general adult medical examination without abnormal findings: Secondary | ICD-10-CM | POA: Diagnosis not present

## 2021-04-02 NOTE — Patient Instructions (Signed)
Amber Brewer , Thank you for taking time to come for your Medicare Wellness Visit. I appreciate your ongoing commitment to your health goals. Please review the following plan we discussed and let me know if I can assist you in the future.   Screening recommendations/referrals: Colonoscopy: colaguard due will discuss with Tommi Rumps  Mammogram: 02/16/2021  due 2023  Bone Density: 10/22/2017 due 2024 Recommended yearly ophthalmology/optometry visit for glaucoma screening and checkup Recommended yearly dental visit for hygiene and checkup  Vaccinations: Influenza vaccine: due in fall 20222 Pneumococcal vaccine: completed series  Tdap vaccine: due  Shingles vaccine: pt declines    Advanced directives: will provide copies   Conditions/risks identified: none   Next appointment: 05/02/2021  @930  with [facility] 28 Years and Older, Female Preventive care refers to lifestyle choices and visits with your health care provider that can promote health and wellness. What does preventive care include? A yearly physical exam. This is also called an annual well check. Dental exams once or twice a year. Routine eye exams. Ask your health care provider how often you should have your eyes checked. Personal lifestyle choices, including: Daily care of your teeth and gums. Regular physical activity. Eating a healthy diet. Avoiding tobacco and drug use. Limiting alcohol use. Practicing safe sex. Taking low-dose aspirin every day. Taking vitamin and mineral supplements as recommended by your health care provider. What happens during an annual well check? The services and screenings done by your health care provider during your annual well check will depend on your age, overall health, lifestyle risk factors, and family history of disease. Counseling  Your health care provider may ask you questions about your: Alcohol use. Tobacco use. Drug use. Emotional well-being. Home and relationship  well-being. Sexual activity. Eating habits. History of falls. Memory and ability to understand (cognition). Work and work Statistician. Reproductive health. Screening  You may have the following tests or measurements: Height, weight, and BMI. Blood pressure. Lipid and cholesterol levels. These may be checked every 5 years, or more frequently if you are over 45 years old. Skin check. Lung cancer screening. You may have this screening every year starting at age 85 if you have a 30-pack-year history of smoking and currently smoke or have quit within the past 15 years. Fecal occult blood test (FOBT) of the stool. You may have this test every year starting at age 64. Flexible sigmoidoscopy or colonoscopy. You may have a sigmoidoscopy every 5 years or a colonoscopy every 10 years starting at age 28. Hepatitis C blood test. Hepatitis B blood test. Sexually transmitted disease (STD) testing. Diabetes screening. This is done by checking your blood sugar (glucose) after you have not eaten for a while (fasting). You may have this done every 1-3 years. Bone density scan. This is done to screen for osteoporosis. You may have this done starting at age 62. Mammogram. This may be done every 1-2 years. Talk to your health care provider about how often you should have regular mammograms. Talk with your health care provider about your test results, treatment options, and if necessary, the need for more tests. Vaccines  Your health care provider may recommend certain vaccines, such as: Influenza vaccine. This is recommended every year. Tetanus, diphtheria, and acellular pertussis (Tdap, Td) vaccine. You may need a Td booster every 10 years. Zoster vaccine. You may need this after age 59. Pneumococcal 13-valent conjugate (PCV13) vaccine. One dose is recommended after age 58. Pneumococcal polysaccharide (PPSV23) vaccine. One dose  is recommended after age 103. Talk to your health care provider about which  screenings and vaccines you need and how often you need them. This information is not intended to replace advice given to you by your health care provider. Make sure you discuss any questions you have with your health care provider. Document Released: 11/03/2015 Document Revised: 06/26/2016 Document Reviewed: 08/08/2015 Elsevier Interactive Patient Education  2017 [place] Prevention in the Home Falls can cause injuries. They can happen to people of all ages. There are many things you can do to make your home safe and to help prevent falls. What can I do on the outside of my home? Regularly fix the edges of walkways and driveways and fix any cracks. Remove anything that might make you trip as you walk through a door, such as a raised step or threshold. Trim any bushes or trees on the path to your home. Use bright outdoor lighting. Clear any walking paths of anything that might make someone trip, such as rocks or tools. Regularly check to see if handrails are loose or broken. Make sure that both sides of any steps have handrails. Any raised decks and porches should have guardrails on the edges. Have any leaves, snow, or ice cleared regularly. Use sand or salt on walking paths during winter. Clean up any spills in your garage right away. This includes oil or grease spills. What can I do in the bathroom? Use night lights. Install grab bars by the toilet and in the tub and shower. Do not use towel bars as grab bars. Use non-skid mats or decals in the tub or shower. If you need to sit down in the shower, use a plastic, non-slip stool. Keep the floor dry. Clean up any water that spills on the floor as soon as it happens. Remove soap buildup in the tub or shower regularly. Attach bath mats securely with double-sided non-slip rug tape. Do not have throw rugs and other things on the floor that can make you trip. What can I do in the bedroom? Use night lights. Make sure that you have a  light by your bed that is easy to reach. Do not use any sheets or blankets that are too big for your bed. They should not hang down onto the floor. Have a firm chair that has side arms. You can use this for support while you get dressed. Do not have throw rugs and other things on the floor that can make you trip. What can I do in the kitchen? Clean up any spills right away. Avoid walking on wet floors. Keep items that you use a lot in easy-to-reach places. If you need to reach something above you, use a strong step stool that has a grab bar. Keep electrical cords out of the way. Do not use floor polish or wax that makes floors slippery. If you must use wax, use non-skid floor wax. Do not have throw rugs and other things on the floor that can make you trip. What can I do with my stairs? Do not leave any items on the stairs. Make sure that there are handrails on both sides of the stairs and use them. Fix handrails that are broken or loose. Make sure that handrails are as long as the stairways. Check any carpeting to make sure that it is firmly attached to the stairs. Fix any carpet that is loose or worn. Avoid having throw rugs at the top or bottom of the stairs.  If you do have throw rugs, attach them to the floor with carpet tape. Make sure that you have a light switch at the top of the stairs and the bottom of the stairs. If you do not have them, ask someone to add them for you. What else can I do to help prevent falls? Wear shoes that: Do not have high heels. Have rubber bottoms. Are comfortable and fit you well. Are closed at the toe. Do not wear sandals. If you use a stepladder: Make sure that it is fully opened. Do not climb a closed stepladder. Make sure that both sides of the stepladder are locked into place. Ask someone to hold it for you, if possible. Clearly mark and make sure that you can see: Any grab bars or handrails. First and last steps. Where the edge of each step  is. Use tools that help you move around (mobility aids) if they are needed. These include: Canes. Walkers. Scooters. Crutches. Turn on the lights when you go into a dark area. Replace any light bulbs as soon as they burn out. Set up your furniture so you have a clear path. Avoid moving your furniture around. If any of your floors are uneven, fix them. If there are any pets around you, be aware of where they are. Review your medicines with your doctor. Some medicines can make you feel dizzy. This can increase your chance of falling. Ask your doctor what other things that you can do to help prevent falls. This information is not intended to replace advice given to you by your health care provider. Make sure you discuss any questions you have with your health care provider. Document Released: 08/03/2009 Document Revised: 03/14/2016 Document Reviewed: 11/11/2014 Elsevier Interactive Patient Education  2017 [employer].

## 2021-04-02 NOTE — Progress Notes (Signed)
Subjective:   Amber Brewer is a 70 y.o. female who presents for an Initial Medicare Annual Wellness Visit.  Review of Systems    N/a      Objective:    There were no vitals filed for this visit. There is no height or weight on file to calculate BMI.  Advanced Directives 05/06/2017 04/18/2017  Does Patient Have a Medical Advance Directive? No No  Would patient like information on creating a medical advance directive? No - Patient declined -    Current Medications (verified) Outpatient Encounter Medications as of 04/02/2021  Medication Sig   albuterol (PROAIR HFA) 108 (90 Base) MCG/ACT inhaler Inhale 1-2 puffs into the lungs every 4 (four) hours as needed.   budesonide (RHINOCORT AQUA) 32 MCG/ACT nasal spray Place 1 spray into both nostrils daily as needed for allergies.    carvedilol (COREG) 12.5 MG tablet TAKE ONE TABLET BY MOUTH EVERY MORNING and TAKE ONE TABLET BY MOUTH EVERY EVENING   Cholecalciferol (VITAMIN D) 50 MCG (2000 UT) tablet Take 2,000 Units by mouth daily.    citalopram (CELEXA) 10 MG tablet Take 0.5 tablets (5 mg total) by mouth daily.   Continuous Blood Gluc Receiver (FREESTYLE LIBRE 14 DAY READER) DEVI 1 Device by Does not apply route every 14 (fourteen) days.   Continuous Blood Gluc Sensor (FREESTYLE LIBRE 14 DAY SENSOR) MISC 1 Device by Does not apply route every 14 (fourteen) days.   cycloSPORINE (RESTASIS) 0.05 % ophthalmic emulsion Place 1 drop into both eyes 2 (two) times daily.   DOTTI 0.05 MG/24HR patch APPLY 1 PATCH TOPICALLY ONTO THE SKIN 2 TIMES A WEEK (Patient taking differently: 0.5 patches. Uses 0.5 patch twice a week)   famotidine (PEPCID) 20 MG tablet Take 1 tablet (20 mg total) by mouth daily.   Flaxseed, Linseed, (FLAX SEED OIL) 1000 MG CAPS Take 1 capsule by mouth daily.   Fluticasone-Umeclidin-Vilant (TRELEGY ELLIPTA) 200-62.5-25 MCG/INH AEPB Inhale 1 puff into the lungs daily.   hydrochlorothiazide (HYDRODIURIL) 25 MG tablet Take 1 tablet (25  mg total) by mouth daily.   irbesartan (AVAPRO) 150 MG tablet Take 2 tablets (300 mg total) by mouth daily.   levothyroxine (SYNTHROID) 50 MCG tablet TAKE 1 TABLET(50 MCG) BY MOUTH DAILY BEFORE BREAKFAST   Multiple Vitamins-Minerals (ALIVE WOMENS GUMMY PO) Take 1 tablet by mouth daily.   multivitamin-lutein (OCUVITE-LUTEIN) CAPS capsule Take 1 capsule by mouth daily.   Omega-3 Fatty Acids (FISH OIL PO) Take 540 mg by mouth daily. Nature Made Mini Omega-3   potassium chloride (KLOR-CON) 10 MEQ tablet TAKE ONE TABLET BY MOUTH EVERY EVENING   rosuvastatin (CRESTOR) 10 MG tablet Take 10 mg by mouth every other day.   triamcinolone (NASACORT) 55 MCG/ACT AERO nasal inhaler Place 1 spray into the nose daily.   TURMERIC PO Take 500 mg by mouth daily.    Zinc 100 MG TABS Take 1 tablet by mouth daily.   No facility-administered encounter medications on file as of 04/02/2021.    Allergies (verified) Codeine phosphate, Prednisone, Betadine [povidone iodine], Sulfa antibiotics, and Iodine   History: Past Medical History:  Diagnosis Date   Asthma    Chicken pox    DDD (degenerative disc disease), cervical    Depression    Gallstones    GERD (gastroesophageal reflux disease)    Hx of melanoma in situ 2002   Hx: UTI (urinary tract infection)    Hyperlipidemia    Hypertension    Hypothyroidism    IBS (  irritable bowel syndrome)    Melanoma (HCC)    x2    Nephrolithiasis    Osteoarthritis    PVC's (premature ventricular contractions)    Status post dilation of esophageal narrowing    Past Surgical History:  Procedure Laterality Date   ABDOMINAL HYSTERECTOMY  1974   APPENDECTOMY  1977   BLADDER SURGERY  1974   BREAST EXCISIONAL BIOPSY     CATARACT EXTRACTION Bilateral 2015   CHOLECYSTECTOMY  1987   removal of melanoma in situ     Family History  Problem Relation Age of Onset   Hypertension Mother    Thyroid disease Mother    Arthritis Mother    Depression Mother    Hearing loss  Mother    Miscarriages / Stillbirths Mother    Heart attack Father    Heart disease Father    Alcohol abuse Father    Arthritis Father    Depression Father    Drug abuse Father    Hypertension Father    Alcohol abuse Sister    Arthritis Sister    Depression Sister    Hearing loss Sister    Hypotension Sister    Hypertension Maternal Aunt    Hypertension Maternal Uncle    Clotting disorder Maternal Uncle    Prostate cancer Maternal Uncle    Colon cancer Maternal Uncle    Colon polyps Maternal Uncle    Diabetes Maternal Uncle        all uncles   Arthritis Maternal Grandmother    Asthma Maternal Grandmother    Breast cancer Maternal Grandmother    Stomach cancer Maternal Grandmother    Hearing loss Maternal Grandmother    Diabetes Maternal Grandfather    Heart disease Maternal Grandfather    Arthritis Maternal Grandfather    Hearing loss Maternal Grandfather    Hypertension Maternal Grandfather    Kidney disease Maternal Grandfather    Hyperlipidemia Paternal Grandmother        also MI   Breast cancer Paternal Grandmother    Arthritis Paternal Grandmother    Heart attack Paternal Grandmother    Hyperlipidemia Paternal Grandfather        also MI   Heart disease Paternal Grandfather    Heart attack Paternal Grandfather    Depression Sister    Heart disease Sister    Hyperlipidemia Sister    Hypertension Son    Endometriosis Daughter    Social History   Socioeconomic History   Marital status: Married    Spouse name: Not on file   Number of children: 2   Years of education: 13   Highest education level: Not on file  Occupational History   Occupation: licensed optician/retired    Employer: OTHER    Comment: Office manager  Tobacco Use   Smoking status: Never   Smokeless tobacco: Never  Vaping Use   Vaping Use: Never used  Substance and Sexual Activity   Alcohol use: No   Drug use: No   Sexual activity: Yes    Birth control/protection: Post-menopausal,  Surgical  Other Topics Concern   Not on file  Social History Narrative   epworth sleepiness scale = 8 (01/18/2016)   Lives at home with her husband and disabled son   Right handed   Caffeine: decaf   Social Determinants of Health   Financial Resource Strain: High Risk   Difficulty of Paying Living Expenses: Very hard  Food Insecurity: Not on file  Transportation Needs: No Transportation Needs  Lack of Transportation (Medical): No   Lack of Transportation (Non-Medical): No  Physical Activity: Insufficiently Active   Days of Exercise per Week: 3 days   Minutes of Exercise per Session: 30 min  Stress: Stress Concern Present   Feeling of Stress : To some extent  Social Connections: Not on file    Tobacco Counseling Counseling given: Not Answered   Clinical Intake:                 Diabetic?no         Activities of Daily Living No flowsheet data found.  Patient Care Team: [name], NP as PCP - General (Family Medicine) Neldon Mc, Donnamarie Poag, MD as Consulting Physician (Allergy and Immunology) Viona Gilmore, [facility] as Pharmacist (Pharmacist)  Indicate any recent Medical Services you may have received from other than [facility] providers in the past year (date may be approximate).     Assessment:   This is a routine wellness examination for Amber Brewer.  Hearing/Vision screen No results found.  Dietary issues and exercise activities discussed:     Goals Addressed   None    Depression Screen PHQ 2/9 Scores 03/02/2020  PHQ - 2 Score 0    Fall Risk Fall Risk  03/02/2020 03/02/2020 06/03/2018 01/17/2017  Falls in the past year? 0 0 Yes No  Number falls in past yr: - - 1 -  Injury with Fall? - - Yes -  Comment - - leg bruise -  Follow up - - Falls prevention discussed -    FALL RISK PREVENTION PERTAINING TO THE HOME:  Any stairs in or around the home? Yes  If so, are there any without handrails? No  Home free of loose throw rugs in walkways, pet beds,  electrical cords, etc? Yes  Adequate lighting in your home to reduce risk of falls? Yes   ASSISTIVE DEVICES UTILIZED TO PREVENT FALLS:  Life alert? No  Use of a cane, walker or w/c? No  Grab bars in the bathroom? No  Shower chair or bench in shower? No  Elevated toilet seat or a handicapped toilet? No    Cognitive Function:  Normal cognitive status assessed by direct observation by this Nurse Health Advisor. No abnormalities found.        Immunizations Immunization History  Administered Date(s) Administered   Influenza Split 09/05/2015   Influenza Whole 09/10/2007, 07/20/2009, 07/27/2011   Influenza, High Dose Seasonal PF 07/24/2019   Influenza,inj,Quad PF,6+ Mos 08/22/2018   Influenza-Unspecified 07/05/2020   PFIZER(Purple Top)SARS-COV-2 Vaccination 12/14/2019, 01/04/2020, 09/18/2020   Pneumococcal Conjugate-13 12/10/2016   Pneumococcal Polysaccharide-23 12/16/2017   Td 10/21/2002   Tdap 01/29/2011    TDAP status: Due, Education has been provided regarding the importance of this vaccine. Advised may receive this vaccine at local pharmacy or Health Dept. Aware to provide a copy of the vaccination record if obtained from local pharmacy or Health Dept. Verbalized acceptance and understanding.  Flu Vaccine status: Up to date   Pneumococcal vaccine status: Up to date  Covid-19 vaccine status: Completed vaccines  Qualifies for Shingles Vaccine? Yes   Zostavax completed No   Shingrix Completed?: No.    Education has been provided regarding the importance of this vaccine. Patient has been advised to call insurance company to determine out of pocket expense if they have not yet received this vaccine. Advised may also receive vaccine at local pharmacy or Health Dept. Verbalized acceptance and understanding.  Screening Tests Health Maintenance  Topic Date Due  Zoster Vaccines- Shingrix (1 of 2) Never done   COVID-19 Vaccine (4 - Booster for Pfizer series) 12/18/2020    TETANUS/TDAP  01/28/2021   INFLUENZA VACCINE  05/21/2021   Fecal DNA (Cologuard)  01/07/2022   MAMMOGRAM  02/17/2023   DEXA SCAN  Completed   Hepatitis C Screening  Completed   PNA vac Low Risk Adult  Completed   HPV VACCINES  Aged Out    Health Maintenance  Health Maintenance Due  Topic Date Due   Zoster Vaccines- Shingrix (1 of 2) Never done   COVID-19 Vaccine (4 - Booster for Pfizer series) 12/18/2020   TETANUS/TDAP  01/28/2021    Colorectal cancer screening: Type of screening: Cologuard. Completed 12/2018. Repeat every 2 years  Mammogram status: Completed 02/16/2021. Repeat every year  Bone Density status: Completed 10/22/2017. Results reflect: Bone density results: OSTEOPENIA. Repeat every 5 years.  Lung Cancer Screening: (Low Dose CT Chest recommended if Age 10-80 years, 30 pack-year currently smoking OR have quit w/in 15years.) does not qualify.   Lung Cancer Screening Referral: n/a  Additional Screening:  Hepatitis C Screening: does qualify; Completed 10/22/2015  Vision Screening: Recommended annual ophthalmology exams for early detection of glaucoma and other disorders of the eye. Is the patient up to date with their annual eye exam?  Yes  Who is the provider or what is the name of the office in which the patient attends annual eye exams? Dr.Walker If pt is not established with a provider, would they like to be referred to a provider to establish care? No .   Dental Screening: Recommended annual dental exams for proper oral hygiene  Community Resource Referral / Chronic Care Management: CRR required this visit?  No   CCM required this visit?  No      Plan:     I have personally reviewed and noted the following in the patient's chart:   Medical and social history Use of alcohol, tobacco or illicit drugs  Current medications and supplements including opioid prescriptions. Patient is not currently taking opioid prescriptions. Functional ability and  status Nutritional status Physical activity Advanced directives List of other physicians Hospitalizations, surgeries, and ER visits in previous 12 months Vitals Screenings to include cognitive, depression, and falls Referrals and appointments  In addition, I have reviewed and discussed with patient certain preventive protocols, quality metrics, and best practice recommendations. A written personalized care plan for preventive services as well as general preventive health recommendations were provided to patient.     Randel Pigg, LPN   0/17/5102   Nurse Notes: none

## 2021-04-24 ENCOUNTER — Telehealth: Payer: Self-pay

## 2021-04-24 MED ORDER — ESTRADIOL 0.05 MG/24HR TD PTTW: 1.0000 | MEDICATED_PATCH | TRANSDERMAL | 2 refills | Status: DC

## 2021-04-24 NOTE — Telephone Encounter (Signed)
-----   Message from Viona Gilmore, [facility] sent at 04/24/2021  9:15 AM EDT ----- Regarding: Estradiol patch Hi,  Can you please send in a refill of the estradiol patches for Ms. Brink to [facility]?  Thanks, [name]

## 2021-04-25 ENCOUNTER — Other Ambulatory Visit: Payer: Self-pay

## 2021-04-25 ENCOUNTER — Telehealth: Payer: Self-pay

## 2021-04-26 NOTE — Telephone Encounter (Signed)
Error please see other phone note

## 2021-05-01 ENCOUNTER — Other Ambulatory Visit: Payer: Self-pay

## 2021-05-02 ENCOUNTER — Ambulatory Visit (INDEPENDENT_AMBULATORY_CARE_PROVIDER_SITE_OTHER): Payer: PPO | Admitting: Adult Health

## 2021-05-02 ENCOUNTER — Encounter: Payer: Self-pay | Admitting: Adult Health

## 2021-05-02 VITALS — BP 142/80 | HR 62 | Temp 96.6°F | Ht 64.25 in | Wt 195.0 lb

## 2021-05-02 DIAGNOSIS — E785 Hyperlipidemia, unspecified: Secondary | ICD-10-CM

## 2021-05-02 DIAGNOSIS — F321 Major depressive disorder, single episode, moderate: Secondary | ICD-10-CM | POA: Diagnosis not present

## 2021-05-02 DIAGNOSIS — E039 Hypothyroidism, unspecified: Secondary | ICD-10-CM | POA: Diagnosis not present

## 2021-05-02 DIAGNOSIS — F411 Generalized anxiety disorder: Secondary | ICD-10-CM | POA: Diagnosis not present

## 2021-05-02 DIAGNOSIS — Z0001 Encounter for general adult medical examination with abnormal findings: Secondary | ICD-10-CM

## 2021-05-02 DIAGNOSIS — R7303 Prediabetes: Secondary | ICD-10-CM | POA: Diagnosis not present

## 2021-05-02 DIAGNOSIS — I10 Essential (primary) hypertension: Secondary | ICD-10-CM | POA: Diagnosis not present

## 2021-05-02 LAB — CBC WITH DIFFERENTIAL/PLATELET
Basophils Absolute: 0.1 10*3/uL (ref 0.0–0.1)
Basophils Relative: 1 % (ref 0.0–3.0)
Eosinophils Absolute: 0.2 10*3/uL (ref 0.0–0.7)
Eosinophils Relative: 2.8 % (ref 0.0–5.0)
HCT: 41.3 % (ref 36.0–46.0)
Hemoglobin: 13.8 g/dL (ref 12.0–15.0)
Lymphocytes Relative: 18.9 % (ref 12.0–46.0)
Lymphs Abs: 1.3 10*3/uL (ref 0.7–4.0)
MCHC: 33.5 g/dL (ref 30.0–36.0)
MCV: 89.4 fl (ref 78.0–100.0)
Monocytes Absolute: 1 10*3/uL (ref 0.1–1.0)
Monocytes Relative: 15.1 % — ABNORMAL HIGH (ref 3.0–12.0)
Neutro Abs: 4.1 10*3/uL (ref 1.4–7.7)
Neutrophils Relative %: 62.2 % (ref 43.0–77.0)
Platelets: 208 10*3/uL (ref 150.0–400.0)
RBC: 4.62 Mil/uL (ref 3.87–5.11)
RDW: 14.3 % (ref 11.5–15.5)
WBC: 6.6 10*3/uL (ref 4.0–10.5)

## 2021-05-02 LAB — COMPREHENSIVE METABOLIC PANEL
ALT: 16 U/L (ref 0–35)
AST: 19 U/L (ref 0–37)
Albumin: 4.3 g/dL (ref 3.5–5.2)
Alkaline Phosphatase: 81 U/L (ref 39–117)
BUN: 13 mg/dL (ref 6–23)
CO2: 27 mEq/L (ref 19–32)
Calcium: 9.9 mg/dL (ref 8.4–10.5)
Chloride: 103 mEq/L (ref 96–112)
Creatinine, Ser: 0.9 mg/dL (ref 0.40–1.20)
GFR: 64.86 mL/min (ref >60.00)
Glucose, Bld: 107 mg/dL — ABNORMAL HIGH (ref 70–99)
Potassium: 4.1 mEq/L (ref 3.5–5.1)
Sodium: 141 mEq/L (ref 135–145)
Total Bilirubin: 0.6 mg/dL (ref 0.2–1.2)
Total Protein: 6.9 g/dL (ref 6.0–8.3)

## 2021-05-02 LAB — TSH: TSH: 5.43 u[IU]/mL (ref 0.35–5.50)

## 2021-05-02 LAB — HEMOGLOBIN A1C: Hgb A1c MFr Bld: 6.6 % — ABNORMAL HIGH (ref 4.6–6.5)

## 2021-05-02 MED ORDER — ESTRADIOL 0.05 MG/24HR TD PTTW: 1.0000 | MEDICATED_PATCH | TRANSDERMAL | 2 refills | Status: DC

## 2021-05-02 MED ORDER — BUPROPION HCL ER (XL) 150 MG PO TB24: 150.0000 mg | ORAL_TABLET | Freq: Every day | ORAL | 0 refills | Status: DC

## 2021-05-02 NOTE — Progress Notes (Signed)
Subjective:    Patient ID: Amber Brewer, female    DOB: 10/21/1951, 70 y.o.   MRN: [id number]  HPI Patient presents for yearly preventative medicine examination. She is a pleasant 70 year old female who  has a past medical history of Asthma, Chicken pox, DDD (degenerative disc disease), cervical, Depression, Gallstones, GERD (gastroesophageal reflux disease), melanoma in situ (2002), UTI (urinary tract infection), Hyperlipidemia, Hypertension, Hypothyroidism, IBS (irritable bowel syndrome), Melanoma ([place]), Nephrolithiasis, Osteoarthritis, PVC's (premature ventricular contractions), and Status post dilation of esophageal narrowing.  Essential Hypertension -managed by cardiology-is currently maintained on Coreg 12.5 mg twice daily, Avapro 300 mg daily, and HCTZ 25 mg daily.  She denies dizziness, lightheadedness, chest pain, or shortness of breath.  She does monitor blood pressures at home with readings in the 140s over 80s or below. BP Readings from Last 3 Encounters:  05/02/21 (!) 142/80  04/02/21 124/74  01/16/21 (!) 142/73   Hypothyroidism -well managed with Synthroid 50 mcg every morning  Seasonal Allergies and asthma -she is seen by allergy and asthma.  Currently prescribed Trelegy Ellipta, albuterol rescue inhaler, and Rhinocort.  Hyperlipidemia -Crestor 10 mg every other day.  Denies myalgia or fatigue Lab Results  Component Value Date   CHOL 154 09/28/2020   HDL 67 09/28/2020   LDLCALC 73 09/28/2020   LDLDIRECT 143.5 01/15/2011   TRIG 56 09/28/2020   CHOLHDL 2.3 09/28/2020   Anxiety and depression -in the past she has been well controlled on Celexa 5 mg.  Unfortunately over the last few months her depression has become worse.  Her elderly mother died in both her husband and she lost their jobs.  Thankfully her husband has regained employment.  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  She is having more crying spells and feels as though she could sleep all the time.  She did try increasing her Celexa to 10 mg but this made her more fatigued.  She denies suicidal ideation  Glucose intolerance - not currently on medications.  Lab Results  Component Value Date   HGBA1C 5.7 (A) 09/28/2020   All immunizations and health maintenance protocols were reviewed with the patient and needed orders were placed.  Appropriate screening laboratory values were ordered for the patient including screening of hyperlipidemia, renal function and hepatic function.  Medication reconciliation,  past medical history, social history, problem list and allergies were reviewed in detail with the patient  Goals were established with regard to weight loss, exercise, and  diet in compliance with medications.  She has not been exercising nor eating healthy and her weight is back up. Wt Readings from Last 3 Encounters:  05/02/21 195 lb (88.5 kg)  04/02/21 194 lb (88 kg)  01/16/21 186 lb 9.6 oz (84.6 kg)    Review of Systems  Constitutional: Negative.   HENT: Negative.    Eyes: Negative.   Respiratory: Negative.    Cardiovascular: Negative.   Gastrointestinal: Negative.   Endocrine: Negative.   Genitourinary: Negative.   Musculoskeletal: Negative.   Skin: Negative.   Allergic/Immunologic: Negative.   Neurological: Negative.   Hematological: Negative.   Psychiatric/Behavioral:  Positive for dysphoric mood and sleep disturbance.   All other systems reviewed and are negative.  Past Medical History:  Diagnosis Date   Asthma    Chicken pox    DDD (degenerative disc disease), cervical  Depression    Gallstones    GERD (gastroesophageal reflux disease)    Hx of melanoma in situ 2002   Hx: UTI (urinary tract infection)    Hyperlipidemia    Hypertension    Hypothyroidism    IBS (irritable bowel syndrome)    Melanoma ([facility])    x2    Nephrolithiasis    Osteoarthritis     PVC's (premature ventricular contractions)    Status post dilation of esophageal narrowing     Social History   Socioeconomic History   Marital status: Married    Spouse name: Not on file   Number of children: 2   Years of education: 13   Highest education level: Not on file  Occupational History   Occupation: licensed optician/retired    Employer: OTHER    Comment: Office manager  Tobacco Use   Smoking status: Never   Smokeless tobacco: Never  Vaping Use   Vaping Use: Never used  Substance and Sexual Activity   Alcohol use: No   Drug use: No   Sexual activity: Yes    Birth control/protection: Post-menopausal, Surgical  Other Topics Concern   Not on file  Social History Narrative   epworth sleepiness scale = 8 (01/18/2016)   Lives at home with her husband and disabled son   Right handed   Caffeine: decaf   Social Determinants of Health   Financial Resource Strain: High Risk   Difficulty of Paying Living Expenses: Very hard  Food Insecurity: No Food Insecurity   Worried About Charity fundraiser in the Last Year: Never true   Ran Out of Food in the Last Year: Never true  Transportation Needs: No Transportation Needs   Lack of Transportation (Medical): No   Lack of Transportation (Non-Medical): No  Physical Activity: Insufficiently Active   Days of Exercise per Week: 3 days   Minutes of Exercise per Session: 30 min  Stress: Not on file  Social Connections: Moderately Integrated   Frequency of Communication with Friends and Family: Three times a week   Frequency of Social Gatherings with Friends and Family: Three times a week   Attends Religious Services: More than 4 times per year   Active Member of Clubs or Organizations: No   Attends Archivist Meetings: Never   Marital Status: Married  Human resources officer Violence: Not At Risk   Fear of Current or Ex-Partner: No   Emotionally Abused: No   Physically Abused: No   Sexually Abused: No    Past  Surgical History:  Procedure Laterality Date   ABDOMINAL HYSTERECTOMY  [place]   BREAST EXCISIONAL BIOPSY     CATARACT EXTRACTION Bilateral 2015   CHOLECYSTECTOMY  1987   removal of melanoma in situ      Family History  Problem Relation Age of Onset   Hypertension Mother    Thyroid disease Mother    Arthritis Mother    Depression Mother    Hearing loss Mother    Miscarriages / Stillbirths Mother    Heart attack Father    Heart disease Father    Alcohol abuse Father    Arthritis Father    Depression Father    Drug abuse Father    Hypertension Father    Alcohol abuse Sister    Arthritis Sister    Depression Sister    Hearing loss Sister    Hypotension Sister    Hypertension Maternal Aunt  Hypertension Maternal Uncle    Clotting disorder Maternal Uncle    Prostate cancer Maternal Uncle    Colon cancer Maternal Uncle    Colon polyps Maternal Uncle    Diabetes Maternal Uncle        all uncles   Arthritis Maternal Grandmother    Asthma Maternal Grandmother    Breast cancer Maternal Grandmother    Stomach cancer Maternal Grandmother    Hearing loss Maternal Grandmother    Diabetes Maternal Grandfather    Heart disease Maternal Grandfather    Arthritis Maternal Grandfather    Hearing loss Maternal Grandfather    Hypertension Maternal Grandfather    Kidney disease Maternal Grandfather    Hyperlipidemia Paternal Grandmother        also MI   Breast cancer Paternal Grandmother    Arthritis Paternal Grandmother    Heart attack Paternal Grandmother    Hyperlipidemia Paternal Grandfather        also MI   Heart disease Paternal Grandfather    Heart attack Paternal Grandfather    Depression Sister    Heart disease Sister    Hyperlipidemia Sister    Hypertension Son    Endometriosis Daughter     Allergies  Allergen Reactions   Codeine Phosphate Nausea And Vomiting    REACTION: unspecified   Prednisone Shortness Of Breath    Betadine [Povidone Iodine] Hives    After surgical skin prep   Sulfa Antibiotics Nausea And Vomiting   Iodine Rash    Broke out on skin once years ago--**Betadine Skin Prep**    Current Outpatient Medications on File Prior to Visit  Medication Sig Dispense Refill   albuterol (PROAIR HFA) 108 (90 Base) MCG/ACT inhaler Inhale 1-2 puffs into the lungs every 4 (four) hours as needed. 54 g 0   budesonide (RHINOCORT AQUA) 32 MCG/ACT nasal spray Place 1 spray into both nostrils daily as needed for allergies.      carvedilol (COREG) 12.5 MG tablet TAKE ONE TABLET BY MOUTH EVERY MORNING and TAKE ONE TABLET BY MOUTH EVERY EVENING 180 tablet 3   Cholecalciferol (VITAMIN D) 50 MCG (2000 UT) tablet Take 2,000 Units by mouth daily.      Continuous Blood Gluc Receiver (FREESTYLE LIBRE 14 DAY READER) DEVI 1 Device by Does not apply route every 14 (fourteen) days. 6 each 3   Continuous Blood Gluc Sensor (FREESTYLE LIBRE 14 DAY SENSOR) MISC 1 Device by Does not apply route every 14 (fourteen) days. 6 each 3   cycloSPORINE (RESTASIS) 0.05 % ophthalmic emulsion Place 1 drop into both eyes 2 (two) times daily.     famotidine (PEPCID) 20 MG tablet Take 1 tablet (20 mg total) by mouth daily. 90 tablet 1   Flaxseed, Linseed, (FLAX SEED OIL) 1000 MG CAPS Take 1 capsule by mouth daily.     Fluticasone-Umeclidin-Vilant (TRELEGY ELLIPTA) 200-62.5-25 MCG/INH AEPB Inhale 1 puff into the lungs daily. 180 each 1   hydrochlorothiazide (HYDRODIURIL) 25 MG tablet Take 1 tablet (25 mg total) by mouth daily. 90 tablet 2   irbesartan (AVAPRO) 150 MG tablet Take 2 tablets (300 mg total) by mouth daily. 180 tablet 1   levothyroxine (SYNTHROID) 50 MCG tablet TAKE 1 TABLET(50 MCG) BY MOUTH DAILY BEFORE BREAKFAST 90 tablet 2   Multiple Vitamins-Minerals (ALIVE WOMENS GUMMY PO) Take 1 tablet by mouth daily.     multivitamin-lutein (OCUVITE-LUTEIN) CAPS capsule Take 1 capsule by mouth daily.     Omega-3 Fatty Acids (FISH OIL PO) Take  540 mg by mouth daily. Nature Made Mini Omega-3     potassium chloride (KLOR-CON) 10 MEQ tablet TAKE ONE TABLET BY MOUTH EVERY EVENING 90 tablet 3   rosuvastatin (CRESTOR) 10 MG tablet Take 10 mg by mouth every other day.     triamcinolone (NASACORT) 55 MCG/ACT AERO nasal inhaler Place 1 spray into the nose daily. 1 each 12   TURMERIC PO Take 500 mg by mouth daily.      Zinc 100 MG TABS Take 1 tablet by mouth daily.     citalopram (CELEXA) 10 MG tablet Take 0.5 tablets (5 mg total) by mouth daily. 45 tablet 0   No current facility-administered medications on file prior to visit.    BP (!) 142/80   Pulse 62   Temp (!) 96.6 F (35.9 C) (Temporal)   Ht 5' 4.25" (1.632 m)   Wt 195 lb (88.5 kg)   SpO2 98%   BMI 33.21 kg/m       Objective:   Physical Exam Vitals and nursing note reviewed.  Constitutional:      General: She is not in acute distress.    Appearance: Normal appearance. She is well-developed. She is not ill-appearing.  HENT:     Head: Normocephalic and atraumatic.     Right Ear: Tympanic membrane, ear canal and external ear normal. There is no impacted cerumen.     Left Ear: Tympanic membrane, ear canal and external ear normal. There is no impacted cerumen.     Nose: Nose normal. No congestion or rhinorrhea.     Mouth/Throat:     Mouth: Mucous membranes are moist.     Pharynx: Oropharynx is clear. No oropharyngeal exudate or posterior oropharyngeal erythema.  Eyes:     General:        Right eye: No discharge.        Left eye: No discharge.     Extraocular Movements: Extraocular movements intact.     Conjunctiva/sclera: Conjunctivae normal.     Pupils: Pupils are equal, round, and reactive to light.  Neck:     Thyroid: No thyromegaly.     Vascular: No carotid bruit.     Trachea: No tracheal deviation.  Cardiovascular:     Rate and Rhythm: Normal rate and regular rhythm.     Pulses: Normal pulses.     Heart sounds: Normal heart sounds. No murmur heard.   No  friction rub. No gallop.  Pulmonary:     Effort: Pulmonary effort is normal. No respiratory distress.     Breath sounds: Normal breath sounds. No stridor. No wheezing, rhonchi or rales.  Chest:     Chest wall: No tenderness.  Abdominal:     General: Abdomen is flat. Bowel sounds are normal. There is no distension.     Palpations: Abdomen is soft. There is no mass.     Tenderness: There is no abdominal tenderness. There is no right CVA tenderness, left CVA tenderness, guarding or rebound.     Hernia: No hernia is present.  Musculoskeletal:        General: No swelling, tenderness, deformity or signs of injury. Normal range of motion.     Cervical back: Normal range of motion and neck supple.     Right lower leg: No edema.     Left lower leg: No edema.  Lymphadenopathy:     Cervical: No cervical adenopathy.  Skin:    General: Skin is warm and dry.     Coloration: Skin  is not jaundiced or pale.     Findings: No bruising, erythema, lesion or rash.  Neurological:     General: No focal deficit present.     Mental Status: She is alert and oriented to person, place, and time.     Cranial Nerves: No cranial nerve deficit.     Sensory: No sensory deficit.     Motor: No weakness.     Coordination: Coordination normal.     Gait: Gait normal.     Deep Tendon Reflexes: Reflexes normal.  Psychiatric:        Attention and Perception: Attention and perception normal.        Mood and Affect: Mood normal.        Speech: Speech normal.        Behavior: Behavior normal.        Thought Content: Thought content normal.        Cognition and Memory: Cognition and memory normal.        Judgment: Judgment normal.     Comments: Tearful during exam        Assessment & Plan:  1. Encounter for general adult medical examination with abnormal findings - Encouraged diet and exercise to help lose weight  - Follow up in one year or sooner if needed - CBC with Differential/Platelet; Future - Comprehensive  metabolic panel; Future - Hemoglobin A1c; Future - TSH; Future - TSH - Hemoglobin A1c - Comprehensive metabolic panel - CBC with Differential/Platelet  2. Generalized anxiety disorder - Continue with Celexa 5 mg  - buPROPion (WELLBUTRIN XL) 150 MG 24 hr tablet; Take 1 tablet (150 mg total) by mouth daily.  Dispense: 30 tablet; Refill: 0  3. Hypothyroidism, unspecified type - Consider increase in synthroid  - CBC with Differential/Platelet; Future - Comprehensive metabolic panel; Future - Hemoglobin A1c; Future - TSH; Future - TSH - Hemoglobin A1c - Comprehensive metabolic panel - CBC with Differential/Platelet  4. Hyperlipidemia, unspecified hyperlipidemia type -She had coffee with creamer prior to her exam today, will have repeat lipid done at upcoming cardiology appointment - CBC with Differential/Platelet; Future - Comprehensive metabolic panel; Future - Hemoglobin A1c; Future - TSH; Future - TSH - Hemoglobin A1c - Comprehensive metabolic panel - CBC with Differential/Platelet  5. Essential hypertension -More elevated than in the past.  She does have a follow-up appointment with cardiology. - CBC with Differential/Platelet; Future - Comprehensive metabolic panel; Future - Hemoglobin A1c; Future - TSH; Future - TSH - Hemoglobin A1c - Comprehensive metabolic panel - CBC with Differential/Platelet  6. Pre-diabetes - Consider metformin  - CBC with Differential/Platelet; Future - Comprehensive metabolic panel; Future - Hemoglobin A1c; Future - TSH; Future - TSH - Hemoglobin A1c - Comprehensive metabolic panel - CBC with Differential/Platelet  7. Depression, major, single episode, moderate ([place]) -We discussed her symptoms and ultimately decided to add a medication.  We will add Wellbutrin 150 mg extended release.  Have her follow-up in 1 month.  She knows to go to the emergency room and stop her medications if suicidal ideation present - buPROPion (WELLBUTRIN  XL) 150 MG 24 hr tablet; Take 1 tablet (150 mg total) by mouth daily.  Dispense: 30 tablet; Refill: 0  [name], NP

## 2021-05-02 NOTE — Patient Instructions (Addendum)
It was great seeing you today   I have added Wellbutrin to your regimen to help with depression. Please follow up in one month   I will follow up with you about your blood work that we do today   Work on weight loss through diet and exericse

## 2021-05-28 ENCOUNTER — Other Ambulatory Visit: Payer: Self-pay | Admitting: Allergy and Immunology

## 2021-05-28 ENCOUNTER — Other Ambulatory Visit: Payer: Self-pay | Admitting: Internal Medicine

## 2021-05-28 ENCOUNTER — Other Ambulatory Visit: Payer: Self-pay | Admitting: Adult Health

## 2021-05-30 ENCOUNTER — Other Ambulatory Visit: Payer: Self-pay | Admitting: Adult Health

## 2021-06-04 ENCOUNTER — Other Ambulatory Visit: Payer: Self-pay

## 2021-06-05 ENCOUNTER — Ambulatory Visit (INDEPENDENT_AMBULATORY_CARE_PROVIDER_SITE_OTHER): Payer: PPO | Admitting: Adult Health

## 2021-06-05 ENCOUNTER — Encounter: Payer: Self-pay | Admitting: Adult Health

## 2021-06-05 ENCOUNTER — Other Ambulatory Visit: Payer: Self-pay | Admitting: Adult Health

## 2021-06-05 DIAGNOSIS — F321 Major depressive disorder, single episode, moderate: Secondary | ICD-10-CM

## 2021-06-05 DIAGNOSIS — F411 Generalized anxiety disorder: Secondary | ICD-10-CM | POA: Diagnosis not present

## 2021-06-05 MED ORDER — BUPROPION HCL ER (XL) 150 MG PO TB24: 150.0000 mg | ORAL_TABLET | Freq: Every day | ORAL | 0 refills | Status: DC

## 2021-06-05 MED ORDER — BUPROPION HCL ER (XL) 150 MG PO TB24: 150.0000 mg | ORAL_TABLET | Freq: Every day | ORAL | 1 refills | Status: DC

## 2021-06-05 NOTE — Progress Notes (Signed)
Subjective:    Patient ID: Amber Brewer, female    DOB: 1951/05/10, 70 y.o.   MRN: [id number]  HPI 70 year old female who  has a past medical history of Asthma, Chicken pox, DDD (degenerative disc disease), cervical, Depression, Gallstones, GERD (gastroesophageal reflux disease), melanoma in situ (2002), UTI (urinary tract infection), Hyperlipidemia, Hypertension, Hypothyroidism, IBS (irritable bowel syndrome), Melanoma ([place]), Nephrolithiasis, Osteoarthritis, PVC's (premature ventricular contractions), and Status post dilation of esophageal narrowing.  She presents to the office today for 1 month follow-up to depression.  Her last visit he reports that her anxiety and depression had become worse over the last few months.  Her elderly mother died in both her and her husband lost their jobs.  She was having to do a lot of work with the trust without any help from her other siblings.  She was having more crying spells and thought that she could sleep all the time.  At home she did try increasing her Celexa from 5 mg to 10 mg but this just made her more fatigued.  We decided to add Wellbutrin 150 mg extended release.   Today she reports that or the first week she had nightmares but those have since resolved. She did notice improvement in her symptoms and liked the way she felt on the medication, she reports improvement in her depression and anxiety. She would like to continue this medication for the time being    Review of Systems See HPI   Past Medical History:  Diagnosis Date   Asthma    Chicken pox    DDD (degenerative disc disease), cervical    Depression    Gallstones    GERD (gastroesophageal reflux disease)    Hx of melanoma in situ 2002   Hx: UTI (urinary tract infection)    Hyperlipidemia    Hypertension    Hypothyroidism    IBS (irritable bowel syndrome)    Melanoma ([facility]CC)    x2    Nephrolithiasis    Osteoarthritis    PVC's (premature ventricular contractions)    Status  post dilation of esophageal narrowing     Social History   Socioeconomic History   Marital status: Married    Spouse name: Not on file   Number of children: 2   Years of education: 13   Highest education level: Not on file  Occupational History   Occupation: licensed optician/retired    Employer: OTHER    Comment: Office manager  Tobacco Use   Smoking status: Never   Smokeless tobacco: Never  Vaping Use   Vaping Use: Never used  Substance and Sexual Activity   Alcohol use: No   Drug use: No   Sexual activity: Yes    Birth control/protection: Post-menopausal, Surgical  Other Topics Concern   Not on file  Social History Narrative   epworth sleepiness scale = 8 (01/18/2016)   Lives at home with her husband and disabled son   Right handed   Caffeine: decaf   Social Determinants of Health   Financial Resource Strain: High Risk   Difficulty of Paying Living Expenses: Very hard  Food Insecurity: No Food Insecurity   Worried About Charity fundraiser in the Last Year: Never true   Ran Out of Food in the Last Year: Never true  Transportation Needs: No Transportation Needs   Lack of Transportation (Medical): No   Lack of Transportation (Non-Medical): No  Physical Activity: Insufficiently Active   Days of Exercise per Week:  3 days   Minutes of Exercise per Session: 30 min  Stress: Not on file  Social Connections: Moderately Integrated   Frequency of Communication with Friends and Family: Three times a week   Frequency of Social Gatherings with Friends and Family: Three times a week   Attends Religious Services: More than 4 times per year   Active Member of Clubs or Organizations: No   Attends Archivist Meetings: Never   Marital Status: Married  Human resources officer Violence: Not At Risk   Fear of Current or Ex-Partner: No   Emotionally Abused: No   Physically Abused: No   Sexually Abused: No    Past Surgical History:  Procedure Laterality Date   ABDOMINAL  HYSTERECTOMY  [place]   BREAST EXCISIONAL BIOPSY     CATARACT EXTRACTION Bilateral 2015   CHOLECYSTECTOMY  1987   removal of melanoma in situ      Family History  Problem Relation Age of Onset   Hypertension Mother    Thyroid disease Mother    Arthritis Mother    Depression Mother    Hearing loss Mother    Miscarriages / Stillbirths Mother    Heart attack Father    Heart disease Father    Alcohol abuse Father    Arthritis Father    Depression Father    Drug abuse Father    Hypertension Father    Alcohol abuse Sister    Arthritis Sister    Depression Sister    Hearing loss Sister    Hypotension Sister    Hypertension Maternal Aunt    Hypertension Maternal Uncle    Clotting disorder Maternal Uncle    Prostate cancer Maternal Uncle    Colon cancer Maternal Uncle    Colon polyps Maternal Uncle    Diabetes Maternal Uncle        all uncles   Arthritis Maternal Grandmother    Asthma Maternal Grandmother    Breast cancer Maternal Grandmother    Stomach cancer Maternal Grandmother    Hearing loss Maternal Grandmother    Diabetes Maternal Grandfather    Heart disease Maternal Grandfather    Arthritis Maternal Grandfather    Hearing loss Maternal Grandfather    Hypertension Maternal Grandfather    Kidney disease Maternal Grandfather    Hyperlipidemia Paternal Grandmother        also MI   Breast cancer Paternal Grandmother    Arthritis Paternal Grandmother    Heart attack Paternal Grandmother    Hyperlipidemia Paternal Grandfather        also MI   Heart disease Paternal Grandfather    Heart attack Paternal Grandfather    Depression Sister    Heart disease Sister    Hyperlipidemia Sister    Hypertension Son    Endometriosis Daughter     Allergies  Allergen Reactions   Codeine Phosphate Nausea And Vomiting    REACTION: unspecified   Prednisone Shortness Of Breath   Betadine [Povidone Iodine] Hives    After surgical skin  prep   Sulfa Antibiotics Nausea And Vomiting   Iodine Rash    Broke out on skin once years ago--**Betadine Skin Prep**    Current Outpatient Medications on File Prior to Visit  Medication Sig Dispense Refill   albuterol (PROAIR HFA) 108 (90 Base) MCG/ACT inhaler Inhale 1-2 puffs into the lungs every 4 (four) hours as needed. 54 g 0   budesonide (RHINOCORT AQUA) 32  MCG/ACT nasal spray Place 1 spray into both nostrils daily as needed for allergies.      carvedilol (COREG) 12.5 MG tablet TAKE ONE TABLET BY MOUTH EVERY MORNING and TAKE ONE TABLET BY MOUTH EVERY EVENING 180 tablet 3   Cholecalciferol (VITAMIN D) 50 MCG (2000 UT) tablet Take 2,000 Units by mouth daily.      Continuous Blood Gluc Sensor (FREESTYLE LIBRE 14 DAY SENSOR) MISC 1 Device by Does not apply route every 14 (fourteen) days. 6 each 3   Continuous Blood Gluc Sensor (FREESTYLE LIBRE 14 DAY SENSOR) MISC USE every 14 DAYS AS DIRECTED 6 each 3   cycloSPORINE (RESTASIS) 0.05 % ophthalmic emulsion Place 1 drop into both eyes 2 (two) times daily.     estradiol (DOTTI) 0.05 MG/24HR patch Place 1 patch (0.05 mg total) onto the skin 2 (two) times a week. APPLY 1 PATCH TOPICALLY ONTO THE SKIN 2 TIMES A WEEK 8 patch 2   famotidine (PEPCID) 20 MG tablet Take 1 tablet (20 mg total) by mouth daily. 90 tablet 1   Flaxseed, Linseed, (FLAX SEED OIL) 1000 MG CAPS Take 1 capsule by mouth daily.     hydrochlorothiazide (HYDRODIURIL) 25 MG tablet TAKE ONE TABLET BY MOUTH EVERY MORNING 90 tablet 1   irbesartan (AVAPRO) 150 MG tablet TAKE TWO TABLETS BY MOUTH DAILY 180 tablet 1   levothyroxine (SYNTHROID) 50 MCG tablet TAKE 1 TABLET(50 MCG) BY MOUTH DAILY BEFORE BREAKFAST 90 tablet 2   Multiple Vitamins-Minerals (ALIVE WOMENS GUMMY PO) Take 1 tablet by mouth daily.     multivitamin-lutein (OCUVITE-LUTEIN) CAPS capsule Take 1 capsule by mouth daily.     Omega-3 Fatty Acids (FISH OIL PO) Take 540 mg by mouth daily. Nature Made Mini Omega-3     potassium  chloride (KLOR-CON) 10 MEQ tablet TAKE ONE TABLET BY MOUTH EVERY EVENING 90 tablet 3   rosuvastatin (CRESTOR) 10 MG tablet Take 10 mg by mouth every other day.     TRELEGY ELLIPTA 200-62.5-25 MCG/INH AEPB INHALE 1 PUFF BY MOUTH INTO LUNGS DAILY 180 each 1   triamcinolone (NASACORT) 55 MCG/ACT AERO nasal inhaler Place 1 spray into the nose daily. 1 each 12   TURMERIC PO Take 500 mg by mouth daily.      Zinc 100 MG TABS Take 1 tablet by mouth daily.     citalopram (CELEXA) 10 MG tablet Take 0.5 tablets (5 mg total) by mouth daily. 45 tablet 0   Flaxseed, Linseed, (FLAX SEED OIL) 1000 MG CAPS      No current facility-administered medications on file prior to visit.    BP (!) 156/84   Pulse (!) 55   Temp 97.9 F (36.6 C) (Oral)   Ht 5' 4.25" (1.632 m)   Wt 195 lb (88.5 kg)   SpO2 97%   BMI 33.21 kg/m       Objective:   Physical Exam Vitals and nursing note reviewed.  Constitutional:      Appearance: Normal appearance.  Musculoskeletal:        General: Normal range of motion.  Skin:    General: Skin is warm and dry.  Neurological:     General: No focal deficit present.     Mental Status: She is alert and oriented to person, place, and time.  Psychiatric:        Mood and Affect: Mood normal.        Behavior: Behavior normal.        Thought Content: Thought content  normal.        Judgment: Judgment normal.      Assessment & Plan:  1. Generalized anxiety disorder  - buPROPion (WELLBUTRIN XL) 150 MG 24 hr tablet; Take 1 tablet (150 mg total) by mouth daily.  Dispense: 90 tablet; Refill: 1  2. Depression, major, single episode, moderate (HCC)  - buPROPion (WELLBUTRIN XL) 150 MG 24 hr tablet; Take 1 tablet (150 mg total) by mouth daily.  Dispense: 90 tablet; Refill: 1   [name], NP

## 2021-06-08 ENCOUNTER — Telehealth: Payer: Self-pay | Admitting: Pharmacist

## 2021-06-08 NOTE — Chronic Care Management (AMB) (Signed)
    Chronic Care Management Pharmacy Assistant   Name: Amber Brewer  MRN: [id number] DOB: 1950/10/30  06/08/21-  Called patient to remind of appointment with Jeni Salles) on (06/11/21 at [facility] via telephone.)  Patient aware of appointment date, time, and type of appointment (either telephone or in person). Patient aware to have/bring all medications, supplements, blood pressure and/or blood sugar logs to visit.  Questions: Have you had any recent office visit or specialist visit outside of [place]? No.   Are there any concerns you would like to discuss during your office visit? Issues with [facility].  Are you having any problems obtaining your medications? Patient gets every 3 months and the last time she got her medications it was only a month. Patient states she does not want a delivering. Patient said she is confused.   If patient has any PAP medications ask if they are having any problems getting their PAP medication or refill? No patient assistance at this time.   Care Gaps:  AWV - completed on 04/02/21 Zoster vaccines - never done Covid-19 booster 4 - overdue since 12/18/20 Tetanus/TDAP - overdue since 01/28/21 Flu vaccine - due  Notes: Patient verified she did last have her crestor filled as below but how often she is suppose to take it has changed a couple of times so she still has pills left.  Star Rating Drug:  Irbesartan '150mg'$  - last filed on 03/09/21 90DS at [facility] Rosuvastatin '10mg'$  - last filled on 09/14/20 90DS at [facility]  Any gaps in medications fill history? No.  [place]  Clinical Pharmacist Assistant [phone]

## 2021-06-10 NOTE — Progress Notes (Signed)
Chronic Care Management Pharmacy Note  06/11/2021 Name:  Amber Brewer MRN:  [id number] DOB:  25-Nov-1950  Summary: Pt reports Wellbutrin is helping with her depression A1c increased to > 6.5%  Recommendations/Changes made from today's visit: -Recommended stopping fish oil based on current triglycerides -Recommended reaching out to hospice counselor again given current mood -Recommend repeat A1c in 3 months  Plan: Mailed healthy plate handout  Subjective: Amber Brewer is an 70 y.o. year old female who is a primary patient of [name], NP.  The CCM team was consulted for assistance with disease management and care coordination needs.    Engaged with patient by telephone for follow up visit in response to provider referral for pharmacy case management and/or care coordination services.   Consent to Services:  The patient was given information about Chronic Care Management services, agreed to services, and gave verbal consent prior to initiation of services.  Please see initial visit note for detailed documentation.   Patient Care Team: [name], NP as PCP - General (Family Medicine) Neldon Mc Donnamarie Poag, MD as Consulting Physician (Allergy and Immunology) Viona Gilmore, [facility] as Pharmacist (Pharmacist)  Recent office visits: 06/05/21 [name], NP: Patient presented for GAD follow up. Patient does like the Wellbutrin and thinks its helping.  05/02/21 [name], NP: Patient presented for annual exam. Prescribed Wellbutrin Xl 150 mg daily. A1c increased.  04/02/21 Randel Pigg, LPN: Patient presented for AWV.  Recent consult visits: 01/16/21 Lyman Bishop, MD (cardiology): Patient presented for HLD and PVCs. Recommended increasing rosuvastatin to every other day.  12/19/20 Allena Katz, MD (allergy): Patient presented for asthma follow up. No medication changes.  11/04/20 Patient presented for sotrovimab infusion for COVID infection.   Hospital visits: None in  previous 6 months  Objective:  Lab Results  Component Value Date   CREATININE 0.90 05/02/2021   BUN 13 05/02/2021   GFR 64.86 05/02/2021   GFRNONAA 62 06/03/2018   GFRAA 72 06/03/2018   NA 141 05/02/2021   K 4.1 05/02/2021   CALCIUM 9.9 05/02/2021   CO2 27 05/02/2021    Lab Results  Component Value Date/Time   HGBA1C 6.6 (H) 05/02/2021 10:12 AM   HGBA1C 5.7 (A) 09/28/2020 09:49 AM   HGBA1C 5.6 06/08/2020 08:58 AM   HGBA1C 6.5 03/02/2020 07:32 AM   GFR 64.86 05/02/2021 10:12 AM   GFR 60.50 03/02/2020 07:32 AM    Last diabetic Eye exam:  Lab Results  Component Value Date/Time   HMDIABEYEEXA No Retinopathy 04/17/2020 12:00 AM    Last diabetic Foot exam: No results found for: HMDIABFOOTEX   Lab Results  Component Value Date   CHOL 154 09/28/2020   HDL 67 09/28/2020   LDLCALC 73 09/28/2020   LDLDIRECT 143.5 01/15/2011   TRIG 56 09/28/2020   CHOLHDL 2.3 09/28/2020    Hepatic Function Latest Ref Rng & Units 05/02/2021 03/02/2020 09/24/2019  Total Protein 6.0 - 8.3 g/dL 6.9 6.8 6.6  Albumin 3.5 - 5.2 g/dL 4.3 4.3 4.2  AST 0 - 37 U/L _0 ALT 0 - 35 U/L _1 Alk Phosphatase 39 - 117 U/L 81 80 76  Total Bilirubin 0.2 - 1.2 mg/dL 0.6 0.7 0.5  Bilirubin, Direct 0.0 - 0.3 mg/dL - - -    Lab Results  Component Value Date/Time   TSH 5.43 05/02/2021 10:12 AM   TSH 2.79 09/28/2020 10:07 AM   FREET4 0.86 12/08/2018 09:30 AM   FREET4 0.76 05/06/2018  09:27 AM    CBC Latest Ref Rng & Units 05/02/2021 03/02/2020 09/24/2019  WBC 4.0 - 10.5 K/uL 6.6 6.8 6.8  Hemoglobin 12.0 - 15.0 g/dL 13.8 13.6 13.6  Hematocrit 36.0 - 46.0 % 41.3 40.9 41.9  Platelets 150.0 - 400.0 K/uL 208.0 195.0 226.0    Lab Results  Component Value Date/Time   VD25OH 36 08/17/2009 09:30 PM    Clinical ASCVD: No  The 10-year ASCVD risk score Mikey Bussing DC Jr., et al., 2013) is: 16.3%   Values used to calculate the score:     Age: 47 years     Sex: Female     Is Non-Hispanic African American:  No     Diabetic: No     Tobacco smoker: No     Systolic Blood Pressure: 952 mmHg     Is BP treated: Yes     HDL Cholesterol: 67 mg/dL     Total Cholesterol: 154 mg/dL    Depression screen [facility] 2/9 06/05/2021 04/02/2021 03/02/2020  Decreased Interest 0 0 0  Down, Depressed, Hopeless 0 0 0  PHQ - 2 Score 0 0 0  Some recent data might be hidden      Social History   Tobacco Use  Smoking Status Never  Smokeless Tobacco Never   BP Readings from Last 3 Encounters:  06/05/21 (!) 156/84  05/02/21 (!) 142/80  04/02/21 124/74   Pulse Readings from Last 3 Encounters:  06/05/21 (!) 55  05/02/21 62  04/02/21 63   Wt Readings from Last 3 Encounters:  06/05/21 195 lb (88.5 kg)  05/02/21 195 lb (88.5 kg)  04/02/21 194 lb (88 kg)    Assessment/Interventions: Review of patient past medical history, allergies, medications, health status, including review of consultants reports, laboratory and other test data, was performed as part of comprehensive evaluation and provision of chronic care management services.   SDOH:  (Social Determinants of Health) assessments and interventions performed: No   CCM Care Plan  Allergies  Allergen Reactions   Codeine Phosphate Nausea And Vomiting    REACTION: unspecified   Prednisone Shortness Of Breath   Betadine [Povidone Iodine] Hives    After surgical skin prep   Sulfa Antibiotics Nausea And Vomiting   Iodine Rash    Broke out on skin once years ago--**Betadine Skin Prep**    Medications Reviewed Today     Reviewed by Franco Collet, CMA (Certified Medical Assistant) on 06/05/21 at [phone]  Med List Status: <None>   Medication Order Taking? Sig Documenting Provider Last Dose Status Informant  albuterol (PROAIR HFA) 108 (90 Base) MCG/ACT inhaler [id number] Yes Inhale 1-2 puffs into the lungs every 4 (four) hours as needed. Kozlow, Donnamarie Poag, MD Taking Active   budesonide (RHINOCORT AQUA) 32 MCG/ACT nasal spray [id number] Yes Place 1 spray into both  nostrils daily as needed for allergies.  [provider] Taking Active Self  buPROPion (WELLBUTRIN XL) 150 MG 24 hr tablet [id number] Yes Take 1 tablet (150 mg total) by mouth daily. Nafziger, Tommi Rumps, NP Taking Active   carvedilol (COREG) 12.5 MG tablet [id number] Yes TAKE ONE TABLET BY MOUTH EVERY MORNING and TAKE ONE TABLET BY MOUTH EVERY EVENING Hilty, Nadean Corwin, MD Taking Active   Cholecalciferol (VITAMIN D) 50 MCG (2000 UT) tablet [id number] Yes Take 2,000 Units by mouth daily.  [provider] Taking Active Self  citalopram (CELEXA) 10 MG tablet [id number]  Take 0.5 tablets (5 mg total) by mouth daily. [name], NP  Active  Continuous Blood Gluc Sensor (FREESTYLE LIBRE 14 DAY SENSOR) [place] [id number] Yes 1 Device by Does not apply route every 14 (fourteen) days. [name], NP Taking Active   Continuous Blood Gluc Sensor (FREESTYLE LIBRE [place]) [place] [id number] Yes USE every 14 DAYS AS DIRECTED Nafziger, Tommi Rumps, NP Taking Active   cycloSPORINE (RESTASIS) 0.05 % ophthalmic emulsion [id number] Yes Place 1 drop into both eyes 2 (two) times daily. [provider] Taking Active Self  estradiol (DOTTI) 0.05 MG/24HR patch [id number] Yes Place 1 patch (0.05 mg total) onto the skin 2 (two) times a week. APPLY 1 PATCH TOPICALLY ONTO THE SKIN 2 TIMES A WEEK Nafziger, Tommi Rumps, NP Taking Active   famotidine (PEPCID) 20 MG tablet [id number] Yes Take 1 tablet (20 mg total) by mouth daily. Kozlow, Donnamarie Poag, MD Taking Active   Flaxseed, Linseed, (FLAX SEED OIL) 1000 MG CAPS [id number] Yes Take 1 capsule by mouth daily. [provider] Taking Active   Flaxseed, Linseed, (FLAX SEED OIL) 1000 MG CAPS [id number]   [provider]  Active   hydrochlorothiazide (HYDRODIURIL) 25 MG tablet [id number] Yes TAKE ONE TABLET BY MOUTH EVERY MORNING Nafziger, Tommi Rumps, NP Taking Active   irbesartan (AVAPRO) 150 MG tablet [id number] Yes TAKE TWO TABLETS BY MOUTH DAILY Hilty, Nadean Corwin, MD Taking  Active   levothyroxine (SYNTHROID) 50 MCG tablet [id number] Yes TAKE 1 TABLET(50 MCG) BY MOUTH DAILY BEFORE BREAKFAST Nafziger, Tommi Rumps, NP Taking Active   Multiple Vitamins-Minerals (ALIVE WOMENS GUMMY PO) [id number] Yes Take 1 tablet by mouth daily. [provider] Taking Active   multivitamin-lutein [facility]) CAPS capsule [id number] Yes Take 1 capsule by mouth daily. [provider] Taking Active   Omega-3 Fatty Acids (FISH OIL PO) [id number] Yes Take 540 mg by mouth daily. Nature Made Mini Omega-3 [provider] Taking Active Self  potassium chloride (KLOR-CON) 10 MEQ tablet [id number] Yes TAKE ONE TABLET BY MOUTH EVERY EVENING Nafziger, Tommi Rumps, NP Taking Active   rosuvastatin (CRESTOR) 10 MG tablet [id number] Yes Take 10 mg by mouth every other day. [provider] Taking Active   TRELEGY ELLIPTA 200-62.5-25 MCG/INH AEPB [id number] Yes INHALE 1 PUFF BY MOUTH INTO LUNGS DAILY Kozlow, Donnamarie Poag, MD Taking Active   triamcinolone (NASACORT) 55 MCG/ACT AERO nasal inhaler [id number] Yes Place 1 spray into the nose daily. [name], NP Taking Active   TURMERIC PO [id number] Yes Take 500 mg by mouth daily.  [provider] Taking Active   Zinc 100 MG TABS [id number] Yes Take 1 tablet by mouth daily. 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/31/2008   HYPOTHYROIDISM, BORDERLINE 12/16/2007   PANIC ATTACK 06/04/2007   DISORDER, 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    Essential hypertension 05/25/2007   OSTEOARTHRITIS 05/25/2007   NEPHROLITHIASIS, HX OF 05/25/2007   ARTHRITIS, HX OF 05/25/2007    Immunization History  Administered Date(s) Administered   Influenza Split 09/05/2015   Influenza Whole 09/10/2007, 07/20/2009, 07/27/2011   Influenza, High Dose Seasonal PF  07/24/2019   Influenza,inj,Quad PF,6+ Mos 08/22/2018   Influenza-Unspecified 07/05/2020   PFIZER(Purple Top)SARS-COV-2 Vaccination 12/14/2019, 01/04/2020, 09/18/2020   Pneumococcal Conjugate-13 12/10/2016   Pneumococcal Polysaccharide-23 12/16/2017   Td 10/21/2002   Tdap 01/29/2011   Patient is feeling good overall physically. Patient is liking Wellbutrin and thinks it is helping with her mood.   Patient noticed the first week she had crazy nightmares with Wellbutrin XL. They have gone away since then.  Conditions to be addressed/monitored:  Hypertension, Hyperlipidemia, Diabetes, Asthma, Hypothyroidism, Depression, and Anxiety  Conditions addressed this visit: Hyperlipidemia, Diabetes, Depression  Care Plan : CCM Pharmacy Care Plan  Updates made by Viona Gilmore, [place] since 06/11/2021 12:00 AM     Problem: Problem: Hypertension, Hyperlipidemia, Diabetes, Asthma, Hypothyroidism and Anxiety      Long-Range Goal: Patient-Specific Goal   Start Date: 12/12/2020  Expected End Date: 12/12/2021  Recent Progress: On track  Priority: High  Note:   Current Barriers:  Unable to independently monitor therapeutic efficacy  Pharmacist Clinical Goal(s):  Patient will verbalize ability to afford treatment regimen achieve adherence to monitoring guidelines and medication adherence to achieve therapeutic efficacy through collaboration with PharmD and provider.   Interventions: 1:1 collaboration with [name], NP regarding development and update of comprehensive plan of care as evidenced by provider attestation and co-signature Inter-disciplinary care team collaboration (see  longitudinal plan of care) Comprehensive medication review performed; medication list updated in electronic medical record  Hypertension (BP goal <140/90) -Controlled -Current treatment: Carvedilol 12.5 mg 1 tablet twice daily with a meal HCTZ 25 mg 1 tablet daily at lunch Irbesartan 150 mg 1 tablet in the morning and at night -Medications previously tried: Bystolic -Current home readings: 129/77 (average) -Current dietary habits: did not discuss -Current exercise habits: not currently exercising -Denies hypotensive/hypertensive symptoms -Educated on Exercise goal of 150 minutes per week; Importance of home blood pressure monitoring; -Counseled to monitor BP at home at least weekly, document, and provide log at future appointments -Counseled on diet and exercise extensively Recommended to continue current medication  Hyperlipidemia: (LDL goal < 100) -Controlled -Current treatment: Rosuvastatin 10 mg 1 tablet every other day -Medications previously tried: none  -Current dietary patterns: patient has not been eating as well lately due to stress -Current exercise habits: not exercising  -Educated on Cholesterol goals;  Importance of limiting foods high in cholesterol; Exercise goal of 150 minutes per week; -Recommended to continue current medication Recommended stopping fish oil and removed from medication list.  Pre-diabetes (A1c goal <6.5%) -Controlled -Current medications: No medications -Medications previously tried: metformin  -Current home glucose readings: using CGM -Denies hypoglycemic/hyperglycemic symptoms -Current meal patterns:  breakfast: n/a  lunch: n/a  dinner: n/a snacks: n/a drinks: n/a -Current exercise: not exercising currently -Educated onExercise goal of 150 minutes per week; Carbohydrate counting and/or plate method -Counseled to check feet daily and get yearly eye exams -Counseled on diet and exercise extensively -Mailed healthy plate  handout  Asthma (Goal: control symptoms and prevent exacerbations) -Controlled -Current treatment  Albuterol HFA 1-2 puffs into the lungs every 4 hrs as needed Trelegy Ellipta 1 puff into the lungs daily  -Medications previously tried: Arnuity  -Patient reports consistent use of maintenance inhaler -Frequency of rescue inhaler use: not often -Counseled on When to use rescue inhaler -Recommended use of albuterol when she is around dust Assessed patient finances. Patient is not having trouble affording.   Depression/anxiety (Goal: minimize symptoms) -Controlled -Current treatment: Citalopram 10 mg 0.5 tablet daily Wellbutrin XL  150 mg 1 tablet daily in morning -Medications previously tried/failed: diazepam, higher dose of citalopram (drowsiness) -PHQ9: 0 -GAD7: n/a -Educated on Benefits of medication for symptom control Benefits of cognitive-behavioral therapy with or without medication -Recommended to continue current medication Patient will be utilizing hospice resources after her mom's death for counseling  Post menopausal (Goal: minimize symptoms ) -Controlled -Current treatment  Dotti 0.05 mg/24 hr patch apply 0.5 patch onto the skin twice a week  -Medications previously tried: none  -Counseled on risks of taking estrogen therapy long term such as strokes, blood clots, and cancer  Health Maintenance -Vaccine gaps: shingrix -Current therapy:  Vitamin D 2000 units take 1 tablet daily Restasis 0.05% eye drop 1 drop into both eyes twice daily Famotidine 20 mg 1 tablet daily Nature Made 540 mg Omega-3 Fish Oil.  Turmeric 1 capsule once daily Budesonide 32 mcg/act nasa; spray 1 spray into both nostrils daily as needed Alive Women's Gummy 1 daily  -Educated on Cost vs benefit of each product must be carefully weighed by individual consumer -Patient is satisfied with current therapy and denies issues -Recommended to continue current medication  Patient Goals/Self-Care  Activities Over the next 180 days, patient will:  - take medications as prescribed check blood pressure at least weekly, document, and provide at future appointments target a minimum of 150 minutes of moderate intensity exercise weekly  Follow Up Plan: Telephone follow up appointment with care management team member scheduled for: 6 months       Medication Assistance: None required.  Patient affirms current coverage meets needs.  Compliance/Adherence/Medication fill history: Care Gaps: Shingrix, COVID booster, tetanus, influenza  Star-Rating Drugs: Irbesartan 180m - last filed on 03/09/21 90DS at [facility] Rosuvastatin 137m- last filled on 09/14/20 90DS at [facility]  [facility] [facility]  [facility] [address] [place]: [phone]: [id number] AT [id number]: [phone]: [phone] pill box? No - adherence packaging Pt endorses 100% compliance  We discussed: Benefits of medication synchronization, packaging and delivery as well as enhanced pharmacist oversight with [facility]. Patient decided to: Utilize [facility] pharmacy for medication synchronization, packaging and delivery  Care Plan and Follow Up Patient Decision:  Patient agrees to Care Plan and Follow-up.  Plan: Telephone follow up appointment with care management team member scheduled for:  6 months  MaJeni SallesPharmD BCNorth Kensingtonharmacist [place] [phone] is due for next adherence delivery on: 03/12/2021. Called patient and reviewed medications and coordinated delivery.  This delivery to include: Carvedilol 12.5 mg: one tablet twice daily at breakfast and dinner Citalopram 10 mg:  tablet daily at breakfast    Famotidine 20 mg: one tablet daily at dinner  Rosuvastatin 10 mg: one tablet every other day at dinner Potassium Chloride 10 MEQ: one tablet daily at dinner Vitamin D3 2000 units daily: one tablet daily at dinner Turmeric 500 mg: one tablet daily at dinner OcDoraldult Multivitamin 50+ 1 capsule daily: one tablet at breakfast Levothyroxine 50 MCG: one tablet at dinner Hydrochlorothiazide 25 MG tablet: one tablet at breakfast DOTTI 0.05 MG/24HR patch Irbesartan 150 mg: Take 1 tablet by mouth at breakfast and at dinner Trelegy Ellipta 200-62.5-25: one puff into lung daily Freestyle Libre 14-day Sensor  kit: use as directed Rhinocort Allergy 32 mcg/act    Patient declined the following medications due to PRN use. Albuterol (PROAIR HFA) 108 (90 Base) MCG/ACT inhaler Fish oil - no longer taking  Coordinate short fill of Wellbutrin 150 mg to be delivered in one month to line up with other medications (she picked up 30 days supply at outside pharmacy).

## 2021-06-11 ENCOUNTER — Ambulatory Visit (INDEPENDENT_AMBULATORY_CARE_PROVIDER_SITE_OTHER): Payer: PPO | Admitting: Pharmacist

## 2021-06-11 DIAGNOSIS — I10 Essential (primary) hypertension: Secondary | ICD-10-CM | POA: Diagnosis not present

## 2021-06-11 DIAGNOSIS — F321 Major depressive disorder, single episode, moderate: Secondary | ICD-10-CM | POA: Diagnosis not present

## 2021-06-11 NOTE — Patient Instructions (Signed)
Hi [place],  It was great to catching up with you again! Below is a summary of some of the topics we discussed.   Please reach out to me if you have any questions or need anything before our follow up!  Best, [name]  Jeni Salles, PharmD, [place] [facility] [place]   Visit Information   Goals Addressed   None    Patient Care Plan: CCM Pharmacy Care Plan     Problem Identified: Problem: Hypertension, Hyperlipidemia, Diabetes, Asthma, Hypothyroidism and Anxiety      Long-Range Goal: Patient-Specific Goal   Start Date: 12/12/2020  Expected End Date: 12/12/2021  Recent Progress: On track  Priority: High  Note:   Current Barriers:  Unable to independently monitor therapeutic efficacy  Pharmacist Clinical Goal(s):  Patient will verbalize ability to afford treatment regimen achieve adherence to monitoring guidelines and medication adherence to achieve therapeutic efficacy through collaboration with PharmD and provider.   Interventions: 1:1 collaboration with [name], NP regarding development and update of comprehensive plan of care as evidenced by provider attestation and co-signature Inter-disciplinary care team collaboration (see longitudinal plan of care) Comprehensive medication review performed; medication list updated in electronic medical record  Hypertension (BP goal <140/90) -Controlled -Current treatment: Carvedilol 12.5 mg 1 tablet twice daily with a meal HCTZ 25 mg 1 tablet daily at lunch Irbesartan 150 mg 1 tablet in the morning and at night -Medications previously tried: Bystolic -Current home readings: 129/77 (average) -Current dietary habits: did not discuss -Current exercise habits: not currently exercising -Denies hypotensive/hypertensive symptoms -Educated on Exercise goal of 150 minutes per week; Importance of home blood pressure monitoring; -Counseled to monitor BP at home at least weekly,  document, and provide log at future appointments -Counseled on diet and exercise extensively Recommended to continue current medication  Hyperlipidemia: (LDL goal < 100) -Controlled -Current treatment: Rosuvastatin 10 mg 1 tablet every other day -Medications previously tried: none  -Current dietary patterns: patient has not been eating as well lately due to stress -Current exercise habits: not exercising  -Educated on Cholesterol goals;  Importance of limiting foods high in cholesterol; Exercise goal of 150 minutes per week; -Recommended to continue current medication Recommended stopping fish oil and removed from medication list.  Pre-diabetes (A1c goal <6.5%) -Controlled -Current medications: No medications -Medications previously tried: metformin  -Current home glucose readings: using CGM -Denies hypoglycemic/hyperglycemic symptoms -Current meal patterns:  breakfast: n/a  lunch: n/a  dinner: n/a snacks: n/a drinks: n/a -Current exercise: not exercising currently -Educated onExercise goal of 150 minutes per week; Carbohydrate counting and/or plate method -Counseled to check feet daily and get yearly eye exams -Counseled on diet and exercise extensively -Mailed healthy plate handout  Asthma (Goal: control symptoms and prevent exacerbations) -Controlled -Current treatment  Albuterol HFA 1-2 puffs into the lungs every 4 hrs as needed Trelegy Ellipta 1 puff into the lungs daily  -Medications previously tried: Arnuity  -Patient reports consistent use of maintenance inhaler -Frequency of rescue inhaler use: not often -Counseled on When to use rescue inhaler -Recommended use of albuterol when she is around dust Assessed patient finances. Patient is not having trouble affording.   Depression/anxiety (Goal: minimize symptoms) -Controlled -Current treatment: Citalopram 10 mg 0.5 tablet daily Wellbutrin XL 150 mg 1 tablet daily in morning -Medications previously  tried/failed: diazepam, higher dose of citalopram (drowsiness) -PHQ9: 0 -GAD7: n/a -Educated on Benefits of medication for symptom control Benefits of cognitive-behavioral therapy with or without medication -Recommended to continue current  medication Patient will be utilizing hospice resources after her mom's death for counseling  Post menopausal (Goal: minimize symptoms ) -Controlled -Current treatment  Dotti 0.05 mg/24 hr patch apply 0.5 patch onto the skin twice a week  -Medications previously tried: none  -Counseled on risks of taking estrogen therapy long term such as strokes, blood clots, and cancer  Health Maintenance -Vaccine gaps: shingrix -Current therapy:  Vitamin D 2000 units take 1 tablet daily Restasis 0.05% eye drop 1 drop into both eyes twice daily Famotidine 20 mg 1 tablet daily Nature Made 540 mg Omega-3 Fish Oil.  Turmeric 1 capsule once daily Budesonide 32 mcg/act nasa; spray 1 spray into both nostrils daily as needed Alive Women's Gummy 1 daily  -Educated on Cost vs benefit of each product must be carefully weighed by individual consumer -Patient is satisfied with current therapy and denies issues -Recommended to continue current medication  Patient Goals/Self-Care Activities Over the next 180 days, patient will:  - take medications as prescribed check blood pressure at least weekly, document, and provide at future appointments target a minimum of 150 minutes of moderate intensity exercise weekly  Follow Up Plan: Telephone follow up appointment with care management team member scheduled for: 6 months       Patient verbalizes understanding of instructions provided today and agrees to view in [place].  Telephone follow up appointment with pharmacy team member scheduled for:6 months  Viona Gilmore, [facility]

## 2021-06-26 DIAGNOSIS — E782 Mixed hyperlipidemia: Secondary | ICD-10-CM | POA: Diagnosis not present

## 2021-06-26 LAB — LIPID PANEL
Chol/HDL Ratio: 2.6 ratio (ref 0.0–4.4)
Cholesterol, Total: 137 mg/dL (ref 100–199)
HDL: 52 mg/dL (ref >39)
LDL Chol Calc (NIH): 66 mg/dL (ref 0–99)
Triglycerides: 104 mg/dL (ref 0–149)
VLDL Cholesterol Cal: 19 mg/dL (ref 5–40)

## 2021-07-01 ENCOUNTER — Other Ambulatory Visit: Payer: Self-pay | Admitting: Adult Health

## 2021-07-03 ENCOUNTER — Encounter: Payer: Self-pay | Admitting: Internal Medicine

## 2021-07-03 ENCOUNTER — Ambulatory Visit: Payer: PPO | Admitting: Internal Medicine

## 2021-07-03 ENCOUNTER — Other Ambulatory Visit: Payer: Self-pay

## 2021-07-03 VITALS — BP 140/78 | HR 58 | Ht 64.0 in | Wt 199.2 lb

## 2021-07-03 DIAGNOSIS — E782 Mixed hyperlipidemia: Secondary | ICD-10-CM | POA: Diagnosis not present

## 2021-07-03 DIAGNOSIS — I10 Essential (primary) hypertension: Secondary | ICD-10-CM | POA: Diagnosis not present

## 2021-07-03 DIAGNOSIS — I493 Ventricular premature depolarization: Secondary | ICD-10-CM

## 2021-07-03 NOTE — Progress Notes (Signed)
OFFICE NOTE  Chief Complaint:  Follow-up  Primary Care Physician: [name], NP  HPI:  Amber Brewer is a 70 y.o. female with a past medical history significant for hypertension, dyslipidemia, asthma/allergies, GERD, Schatzki's ring and prior esophageal dilatation about 5 years ago.  She now presents with recent episodes of squeezing in the chest which feel episodic and spasmodic. She is concerned that this may be esophageal spasm however her primary care provider wanted to rule out possible coronary disease. She feels a tightness in her chest that is not necessarily associated with eating or present with exertion or relieved by rest. She is also short of breath, particularly when walking up hills. The tightness is been coming on more recently. She's also been under significant stress and does have some history of anxiety. She is caring for disabled son and has an 32 year old mother who she is caring for. 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 She reports that she does have some mild trouble swallowing and occasionally food does get stuck in her mid throat and often times pills are difficult to swallow and may come up. She also reported a brief presyncopal episode however this is with significantly rotating her head about 90 the cause that quick decrease in vision which improved when straightening her head out. She does have a history of neck injury in the past.  02/26/2016  Amber Brewer returns today for follow-up of her nuclear stress test. This is an exercise study in which no ischemia was noted. LVEF was greater than 65%. She did have a hypertensive response to exercise with a peak systolic blood pressure 123456. Blood pressure appears that is not as well-controlled as it should be. We discussed her chest discomfort which she says she is had some improvement in. Given her prior esophageal dilatation history and Schatzki's ring, she may be having esophageal  spasm. I asked her to follow-up with her gastroenterologist about this. She may need manometry. If this is the case then she may benefit from addition of low-dose calcium channel blocker. She does not want to add any extra medication at this time, however blood pressure is suboptimally controlled.  05/27/2016  Amber Brewer returns today for follow-up. She continues to have some anxiety episodes. This is associated with some throat tightness. She also has a history of esophageal stricture and Schatzki's ring. She has made an appointment with her gastroenterologist but that is not until September. It's unclear whether these episodes are GI related or anxiety. I recently increased her Bystolic to 7.5 mg daily. Her home blood pressure readings indicate good control.  11/08/2015  Amber Brewer was seen today as an add-on for feeling faint and having palpitations. An EKG in the office shows frequent unifocal PVCs. This is symptomatically similar to what she had when I saw her back in August however she is having more frequent episodes. She is also under significant stress. Her husband recently had intracranial hemorrhage and had emergency brain surgery. She also takes care of her elderly mother and disabled child. This causes a lot of stress in her life. She's had asthma however she is on inhaled steroid and rarely uses a beta agonist. She is on Bystolic however that has very poor ability to block PVCs. LV function was normal this past year in April 2017 by stress test without any ischemia, therefore not concerned about these being ischemic PVCs.  11/11/2016  Amber Brewer returns today for follow-up. She reports  symptomatic improvement in her palpitations on carvedilol. Blood pressure is low normal. She was getting a little dizzy and move her blood pressure medicines to the middle of the day. She reports about 50% improvement in her palpitations. She says she is breathing better and has more  energy.  12/19/2016  Amber Brewer is seen today in follow-up. Overall she feels much better. She is actually made adjustments or other medication at home. She felt like a higher dose of carvedilol was robbing her of energy. Not quite as bad as being on diastolic but certainly worse. She therefore decreased the dose and currently is only taking 12.5 mg every morning. She subsequently increase the dose of her irbesartan up to 300 mg daily. Blood pressure control is been excellent with most readings between [id number] and [id number] systolic. She denies any worsening palpitations. She is generally pleased with how she feels on this current medication regimen. I did report to her that carvedilol is typically twice daily medicine because of the short half-life and we discussed the possibility of dividing the dose twice daily.  06/17/2017  Amber Brewer is seen today in follow-up. She reports improvement in her palpitations on carvedilol however they're not as well-controlled as they have been in the past. She's previously been on an increased dose of the carvedilol and felt that it was robbing her energy. Is difficult to find a balance between the 2 medications. She's been struggling with vestibular problems and is undergoing neuro physical therapy. She says this is been helpful for her.  01/16/2021  Amber Brewer returns today for follow-up.  I saw her last via telemedicine visit.  I had recommended increasing her irbesartan to 300 mg daily.  She notes that at home her blood pressures generally in the [id number] systolic range although was more elevated today 142/73.  She also has lost a significant amount of weight.  Some of this was intentional and some of it not because she had COVID-19 in January and she was taking care of her mother who ultimately died of that.  Her cholesterol however is come down substantially.  Total was 154, triglycerides 67 HDL 56 and LDL of 73, this is down from 171 previously.  She was taken the  rosuvastatin 10 mg daily but was advised by her PCP to drop that back to once weekly.  I do think we could cut back on it however I would be hesitant to say that this was all from diet and it will likely increase with a significant reduction in her rosuvastatin.   07/03/2021  Amber Brewer is seen today in follow-up.  Overall she says she feels very well.  She does not have any significant palpitations in some time.  EKG today shows no PVCs.  Weight is unfortunately crept up some as is her blood pressure.  Today it was initially elevated however recheck came down to 140/78.  She said this is more in line with her home readings.  Cholesterol however just recently checked is still pretty good with total 137, HDL 52, triglycerides 104 and LDL 66.  Her A1c was 5.4 and has come up to 6.6 as of July.  This has been associated with weight gain.  This past year has been difficult with several losses in her family including her mother and she contracted COVID-1.  She is now just starting to feel that she could become more active and work on weight loss.  PMHx:  Past Medical History:  Diagnosis Date  Asthma    Chicken pox    DDD (degenerative disc disease), cervical    Depression    Gallstones    GERD (gastroesophageal reflux disease)    Hx of melanoma in situ 2002   Hx: UTI (urinary tract infection)    Hyperlipidemia    Hypertension    Hypothyroidism    IBS (irritable bowel syndrome)    Melanoma (HCC)    x2    Nephrolithiasis    Osteoarthritis    PVC's (premature ventricular contractions)    Status post dilation of esophageal narrowing     Past Surgical History:  Procedure Laterality Date   ABDOMINAL HYSTERECTOMY  1974   APPENDECTOMY  1977   BLADDER SURGERY  1974   BREAST EXCISIONAL BIOPSY     CATARACT EXTRACTION Bilateral 2015   CHOLECYSTECTOMY  1987   removal of melanoma in situ      FAMHx:  Family History  Problem Relation Age of Onset   Hypertension Mother    Thyroid disease  Mother    Arthritis Mother    Depression Mother    Hearing loss Mother    Miscarriages / Stillbirths Mother    Heart attack Father    Heart disease Father    Alcohol abuse Father    Arthritis Father    Depression Father    Drug abuse Father    Hypertension Father    Alcohol abuse Sister    Arthritis Sister    Depression Sister    Hearing loss Sister    Hypotension Sister    Hypertension Maternal Aunt    Hypertension Maternal Uncle    Clotting disorder Maternal Uncle    Prostate cancer Maternal Uncle    Colon cancer Maternal Uncle    Colon polyps Maternal Uncle    Diabetes Maternal Uncle        all uncles   Arthritis Maternal Grandmother    Asthma Maternal Grandmother    Breast cancer Maternal Grandmother    Stomach cancer Maternal Grandmother    Hearing loss Maternal Grandmother    Diabetes Maternal Grandfather    Heart disease Maternal Grandfather    Arthritis Maternal Grandfather    Hearing loss Maternal Grandfather    Hypertension Maternal Grandfather    Kidney disease Maternal Grandfather    Hyperlipidemia Paternal Grandmother        also MI   Breast cancer Paternal Grandmother    Arthritis Paternal Grandmother    Heart attack Paternal Grandmother    Hyperlipidemia Paternal Grandfather        also MI   Heart disease Paternal Grandfather    Heart attack Paternal Grandfather    Depression Sister    Heart disease Sister    Hyperlipidemia Sister    Hypertension Son    Endometriosis Daughter     SOCHx:   reports that she has never smoked. She has never used smokeless tobacco. She reports that she does not drink alcohol and does not use drugs.  ALLERGIES:  Allergies  Allergen Reactions   Codeine Phosphate Nausea And Vomiting    REACTION: unspecified   Prednisone Shortness Of Breath   Betadine [Povidone Iodine] Hives    After surgical skin prep   Sulfa Antibiotics Nausea And Vomiting   Iodine Rash    Broke out on skin once years ago--**Betadine Skin  Prep**    ROS: Pertinent items noted in HPI and remainder of comprehensive ROS otherwise negative.  HOME MEDS: Current Outpatient Medications  Medication Sig Dispense Refill  albuterol (PROAIR HFA) 108 (90 Base) MCG/ACT inhaler Inhale 1-2 puffs into the lungs every 4 (four) hours as needed. 54 g 0   budesonide (RHINOCORT AQUA) 32 MCG/ACT nasal spray Place 1 spray into both nostrils daily as needed for allergies.      buPROPion (WELLBUTRIN XL) 150 MG 24 hr tablet Take 1 tablet (150 mg total) by mouth daily. 90 tablet 1   carvedilol (COREG) 12.5 MG tablet TAKE ONE TABLET BY MOUTH EVERY MORNING and TAKE ONE TABLET BY MOUTH EVERY EVENING 180 tablet 3   Cholecalciferol (VITAMIN D) 50 MCG (2000 UT) tablet Take 2,000 Units by mouth daily.      citalopram (CELEXA) 10 MG tablet TAKE 1/2 TABLET BY MOUTH DAILY 45 tablet 2   Continuous Blood Gluc Sensor (FREESTYLE LIBRE 14 DAY SENSOR) MISC 1 Device by Does not apply route every 14 (fourteen) days. 6 each 3   cycloSPORINE (RESTASIS) 0.05 % ophthalmic emulsion Place 1 drop into both eyes 2 (two) times daily.     estradiol (DOTTI) 0.05 MG/24HR patch Place 1 patch (0.05 mg total) onto the skin 2 (two) times a week. APPLY 1 PATCH TOPICALLY ONTO THE SKIN 2 TIMES A WEEK 8 patch 2   famotidine (PEPCID) 20 MG tablet Take 1 tablet (20 mg total) by mouth daily. 90 tablet 1   hydrochlorothiazide (HYDRODIURIL) 25 MG tablet TAKE ONE TABLET BY MOUTH EVERY MORNING 90 tablet 1   irbesartan (AVAPRO) 150 MG tablet TAKE TWO TABLETS BY MOUTH DAILY 180 tablet 1   levothyroxine (SYNTHROID) 50 MCG tablet TAKE ONE TABLET BY MOUTH BEFORE BREAKFAST 90 tablet 2   Multiple Vitamins-Minerals (ALIVE WOMENS GUMMY PO) Take 1 tablet by mouth daily.     multivitamin-lutein (OCUVITE-LUTEIN) CAPS capsule Take 1 capsule by mouth daily.     potassium chloride (KLOR-CON) 10 MEQ tablet TAKE ONE TABLET BY MOUTH EVERY EVENING 90 tablet 3   rosuvastatin (CRESTOR) 10 MG tablet Take 10 mg by mouth  every other day.     TRELEGY ELLIPTA 200-62.5-25 MCG/INH AEPB INHALE 1 PUFF BY MOUTH INTO LUNGS DAILY 180 each 1   triamcinolone (NASACORT) 55 MCG/ACT AERO nasal inhaler INSTILL 1 SPRAY ONCE DAILY 1 mL 12   TURMERIC PO Take 500 mg by mouth daily.      buPROPion (WELLBUTRIN XL) 150 MG 24 hr tablet Take 1 tablet (150 mg total) by mouth daily. (Patient not taking: Reported on 07/03/2021) 30 tablet 0   Zinc 100 MG TABS Take 1 tablet by mouth daily. (Patient not taking: Reported on 07/03/2021)     No current facility-administered medications for this visit.    LABS/IMAGING: No results found for this or any previous visit (from the past 48 hour(s)). No results found.  WEIGHTS: Wt Readings from Last 3 Encounters:  07/03/21 199 lb 3.2 oz (90.4 kg)  06/05/21 195 lb (88.5 kg)  05/02/21 195 lb (88.5 kg)    VITALS: BP 140/78   Pulse (!) 58   Ht '5\' 4"'$  (1.626 m)   Wt 199 lb 3.2 oz (90.4 kg)   SpO2 98%   BMI 34.19 kg/m   EXAM: General appearance: alert and no distress Neck: no carotid bruit and no JVD Lungs: clear to auscultation bilaterally Heart: regular rate and rhythm, S1, S2 normal, no murmur, click, rub or gallop Abdomen: soft, non-tender; bowel sounds normal; no masses,  no organomegaly Extremities: extremities normal, atraumatic, no cyanosis or edema Pulses: 2+ and symmetric Skin: Skin color, texture, turgor normal. No rashes  or lesions Neurologic: Grossly normal Psych: Pleasant  EKG: Sinus bradycardia 58, nonspecific ST and T wave changes-personally reviewed  ASSESSMENT: Frequent symptomatic PVC's - improved Chest pain, more likely esophageal spasm - low risk exercise nuclear stress test (2017) Progressive dyspnea on exertion - improved Hypertension - controlled Dyslipidemia Neuro-vestibular imbalance  PLAN: 1.  Amber Brewer seems to be feeling better.  She has had a difficult year with COVID-19 and a number of losses in her family.  Weight is going up which is affected  her blood pressure.  Cholesterol still decently controlled.  I think the main key is more physical activity and weight loss for her.  Overall her blood pressure is a little high but still very reasonable.  We will continue her current medications.  Plan follow-up with me in 6 months or sooner as necessary.  Pixie Casino, MD, [facility], [place] Director of the Advanced Lipid Disorders &  Cardiovascular Risk Reduction Clinic Diplomate of the American Board of Clinical Lipidology Attending Cardiologist  Direct Dial: [phone]  Fax: [phone]  Website:  [email or web address] Amber Brewer 07/03/2021, 9:58 AM

## 2021-07-03 NOTE — Patient Instructions (Signed)
Medication Instructions:  Continue same medications *If you need a refill on your cardiac medications before your next appointment, please call your pharmacy*   Lab Work: None ordered   Testing/Procedures: None ordered   Follow-Up: At [facility], you and your health needs are our priority.  As part of our continuing mission to provide you with exceptional heart care, we have created designated Provider Care Teams.  These Care Teams include your primary Cardiologist (physician) and Advanced Practice Providers (APPs -  Physician Assistants and Nurse Practitioners) who all work together to provide you with the care you need, when you need it.  We recommend signing up for the patient portal called "MyChart".  Sign up information is provided on this After Visit Summary.  MyChart is used to connect with patients for Virtual Visits (Telemedicine).  Patients are able to view lab/test results, encounter notes, upcoming appointments, etc.  Non-urgent messages can be sent to your provider as well.   To learn more about what you can do with MyChart, go to [email or web address].     Your next appointment:  6 months   Call in Dec to schedule March appointment     The format for your next appointment: Office    Provider:  Dr.Hilty

## 2021-07-09 ENCOUNTER — Other Ambulatory Visit: Payer: Self-pay | Admitting: Internal Medicine

## 2021-07-13 ENCOUNTER — Telehealth: Payer: Self-pay | Admitting: Adult Health

## 2021-07-13 NOTE — Telephone Encounter (Signed)
PT called to advise that she has had shingles since last Thursday and the rash that she has hurts and has been spreading. She also states the inside of her body aches as well.

## 2021-07-13 NOTE — Telephone Encounter (Signed)
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

## 2021-07-13 NOTE — Telephone Encounter (Signed)
Noted. FYI 

## 2021-08-09 DIAGNOSIS — M25551 Pain in right hip: Secondary | ICD-10-CM | POA: Diagnosis not present

## 2021-08-09 DIAGNOSIS — M7061 Trochanteric bursitis, right hip: Secondary | ICD-10-CM | POA: Diagnosis not present

## 2021-08-22 ENCOUNTER — Telehealth: Payer: Self-pay | Admitting: Pharmacist

## 2021-08-22 NOTE — Chronic Care Management (AMB) (Addendum)
Chronic Care Management Pharmacy Assistant   Name: Amber Brewer  MRN: [id number] DOB: 1951-03-16   Reason for Encounter: Disease State/ Hyperlipidemia Assessment Call.   Conditions to be addressed/monitored: HLD  Recent office visits:  None.  Recent consult visits:  07/03/21 Lyman Bishop MD (Cardiology) - seen for hypertension. No medication changes. Follow up in 6 months.   Hospital visits:  None in previous 6 months  Medications: Outpatient Encounter Medications as of 08/22/2021  Medication Sig   albuterol (PROAIR HFA) 108 (90 Base) MCG/ACT inhaler Inhale 1-2 puffs into the lungs every 4 (four) hours as needed.   budesonide (RHINOCORT AQUA) 32 MCG/ACT nasal spray Place 1 spray into both nostrils daily as needed for allergies.    buPROPion (WELLBUTRIN XL) 150 MG 24 hr tablet Take 1 tablet (150 mg total) by mouth daily.   buPROPion (WELLBUTRIN XL) 150 MG 24 hr tablet Take 1 tablet (150 mg total) by mouth daily. (Patient not taking: Reported on 07/03/2021)   carvedilol (COREG) 12.5 MG tablet TAKE ONE TABLET BY MOUTH EVERY MORNING and TAKE ONE TABLET BY MOUTH EVERY EVENING   Cholecalciferol (VITAMIN D) 50 MCG (2000 UT) tablet Take 2,000 Units by mouth daily.    citalopram (CELEXA) 10 MG tablet TAKE 1/2 TABLET BY MOUTH DAILY   Continuous Blood Gluc Sensor (FREESTYLE LIBRE 14 DAY SENSOR) MISC 1 Device by Does not apply route every 14 (fourteen) days.   cycloSPORINE (RESTASIS) 0.05 % ophthalmic emulsion Place 1 drop into both eyes 2 (two) times daily.   estradiol (DOTTI) 0.05 MG/24HR patch Place 1 patch (0.05 mg total) onto the skin 2 (two) times a week. APPLY 1 PATCH TOPICALLY ONTO THE SKIN 2 TIMES A WEEK   famotidine (PEPCID) 20 MG tablet Take 1 tablet (20 mg total) by mouth daily.   hydrochlorothiazide (HYDRODIURIL) 25 MG tablet TAKE ONE TABLET BY MOUTH EVERY MORNING   irbesartan (AVAPRO) 150 MG tablet TAKE TWO TABLETS BY MOUTH DAILY   levothyroxine (SYNTHROID) 50 MCG tablet  TAKE ONE TABLET BY MOUTH BEFORE BREAKFAST   Multiple Vitamins-Minerals (ALIVE WOMENS GUMMY PO) Take 1 tablet by mouth daily.   multivitamin-lutein (OCUVITE-LUTEIN) CAPS capsule Take 1 capsule by mouth daily.   potassium chloride (KLOR-CON) 10 MEQ tablet TAKE ONE TABLET BY MOUTH EVERY EVENING   rosuvastatin (CRESTOR) 10 MG tablet Take 10 mg by mouth every other day.   TRELEGY ELLIPTA 200-62.5-25 MCG/INH AEPB INHALE 1 PUFF BY MOUTH INTO LUNGS DAILY   triamcinolone (NASACORT) 55 MCG/ACT AERO nasal inhaler INSTILL 1 SPRAY ONCE DAILY   TURMERIC PO Take 500 mg by mouth daily.    Zinc 100 MG TABS Take 1 tablet by mouth daily. (Patient not taking: Reported on 07/03/2021)   No facility-administered encounter medications on file as of 08/22/2021.   Fill History: carvedilol 12.5 mg tablet 06/11/2021 90   citalopram 10 mg tablet 06/11/2021 90   FREESTYLE LIBRE 14 DAY/READER/FLASH MONITORING SYSTEM DEVICE 03/09/2021 90   FreeStyle Libre 14 Day Sensor kit 06/11/2021 84   Dotti 0.05 mg/24 hr transdermal patch 06/11/2021 84   famotidine 20 mg tablet 06/11/2021 90   irbesartan 150 mg tablet 06/11/2021 90   Synthroid 50 mcg tablet 06/11/2021 90   potassium chloride ER 10 mEq tablet,extended release 06/11/2021 90   rosuvastatin 10 mg tablet 06/11/2021 89   triamcinolone acetonide 55 mcg nasal spray aerosol 06/11/2021 30   BUPROPION HYDROCHLORIDE ER (XL) 150MG TABLET EXTENDED RELEASE 24 HOUR 06/28/2021 73   hydrochlorothiazide 25  mg tablet 06/11/2021 90   Comprehensive medication review performed; Spoke to patient regarding cholesterol  Lipid Panel    Component Value Date/Time   CHOL 137 06/26/2021 1100   TRIG 104 06/26/2021 1100   HDL 52 06/26/2021 1100   LDLCALC 66 06/26/2021 1100   LDLCALC 73 09/28/2020 1007   LDLDIRECT 143.5 01/15/2011 0824    10-year ASCVD risk score: The 10-year ASCVD risk score (Arnett DK, et al., 2019) is: 13.5%   Values used to calculate the score:     Age: 70  years     Sex: Female     Is Non-Hispanic African American: No     Diabetic: No     Tobacco smoker: No     Systolic Blood Pressure: 413 mmHg     Is BP treated: Yes     HDL Cholesterol: 52 mg/dL     Total Cholesterol: 137 mg/dL  Current antihyperlipidemic regimen:  Rosuvastatin 46m - take 1 tablet by mouth every other day.  Previous antihyperlipidemic medications tried: None.  ASCVD risk enhancing conditions: age >>82and HTN What recent interventions/DTPs have been made by any provider to improve Cholesterol control since last CPP Visit: None. Any recent hospitalizations or ED visits since last visit with CPP? No  Adherence Review: Does the patient have >5 day gap between last estimated fill dates? No  Notes: Spoke with patient and reviewed all medications as prescribed. Patient reports taking all medications except for the bupropion and zinc. Patient inquired about her hydrochlorothiazide being recalled due to causing cancer. Message sent to MJeni Sallesto inquire about this and she stated that it was only two specific lots of quinapril-hydrochlorothiazide combination medications. Patient also stated we fill her crestor in packages with [facility] so the current date listed in her medical chart is not up to date. Patient reports no issues with medications at this time due to cost or pharmacy. Patient has no issues getting food. Patient still works full time as an [employer] she gets plenty of physical activity. Patient thanked me for my call.    Care Gaps:  AWV - completed on 04/02/21 Zoster vaccines - never done Covid-19 booster 4 - overdue since 12/18/20 Tetanus/TDAP - overdue since 01/28/21 Flu vaccine - due Last PCP BP: 156/84 P: 55 on 06/05/21  Star Rating Drugs:  Irbesartan 1545m- last filed on 03/09/21 90DS at [facility] Rosuvastatin 1087m last filled on 09/14/20 90DS at [phone]

## 2021-08-23 ENCOUNTER — Other Ambulatory Visit: Payer: Self-pay | Admitting: Allergy and Immunology

## 2021-08-24 ENCOUNTER — Other Ambulatory Visit: Payer: Self-pay | Admitting: Adult Health

## 2021-08-30 ENCOUNTER — Telehealth: Payer: Self-pay | Admitting: Pharmacist

## 2021-08-30 NOTE — Chronic Care Management (AMB) (Addendum)
Chronic Care Management Pharmacy Assistant   Name: Amber Brewer  MRN: [id number] DOB: Jan 20, 1951  Reason for Encounter: Medication Review / Medication Coordination/ BP assessment   Conditions to be addressed/monitored: HTN  Recent office visits:  None  Recent consult visits:  None  Hospital visits:  None in previous 6 months  Medications: Outpatient Encounter Medications as of 08/30/2021  Medication Sig   albuterol (PROAIR HFA) 108 (90 Base) MCG/ACT inhaler Inhale 1-2 puffs into the lungs every 4 (four) hours as needed.   budesonide (RHINOCORT AQUA) 32 MCG/ACT nasal spray Place 1 spray into both nostrils daily as needed for allergies.    buPROPion (WELLBUTRIN XL) 150 MG 24 hr tablet Take 1 tablet (150 mg total) by mouth daily.   buPROPion (WELLBUTRIN XL) 150 MG 24 hr tablet Take 1 tablet (150 mg total) by mouth daily. (Patient not taking: Reported on 07/03/2021)   carvedilol (COREG) 12.5 MG tablet TAKE ONE TABLET BY MOUTH EVERY MORNING and TAKE ONE TABLET BY MOUTH EVERY EVENING   Cholecalciferol (VITAMIN D) 50 MCG (2000 UT) tablet Take 2,000 Units by mouth daily.    citalopram (CELEXA) 10 MG tablet TAKE 1/2 TABLET BY MOUTH DAILY   Continuous Blood Gluc Sensor (FREESTYLE LIBRE 14 DAY SENSOR) MISC 1 Device by Does not apply route every 14 (fourteen) days.   cycloSPORINE (RESTASIS) 0.05 % ophthalmic emulsion Place 1 drop into both eyes 2 (two) times daily.   estradiol (VIVELLE-DOT) 0.05 MG/24HR patch Place ONE PATCH onto THE SKIN twice A WEEK   famotidine (PEPCID) 20 MG tablet TAKE ONE TABLET BY MOUTH EVERY EVENING   hydrochlorothiazide (HYDRODIURIL) 25 MG tablet TAKE ONE TABLET BY MOUTH EVERY MORNING   irbesartan (AVAPRO) 150 MG tablet TAKE TWO TABLETS BY MOUTH DAILY   levothyroxine (SYNTHROID) 50 MCG tablet TAKE ONE TABLET BY MOUTH BEFORE BREAKFAST   Multiple Vitamins-Minerals (ALIVE WOMENS GUMMY PO) Take 1 tablet by mouth daily.   multivitamin-lutein (OCUVITE-LUTEIN) CAPS  capsule Take 1 capsule by mouth daily.   potassium chloride (KLOR-CON) 10 MEQ tablet TAKE ONE TABLET BY MOUTH EVERY EVENING   rosuvastatin (CRESTOR) 10 MG tablet Take 10 mg by mouth every other day.   TRELEGY ELLIPTA 200-62.5-25 MCG/INH AEPB INHALE 1 PUFF BY MOUTH INTO LUNGS DAILY   triamcinolone (NASACORT) 55 MCG/ACT AERO nasal inhaler INSTILL 1 SPRAY ONCE DAILY   TURMERIC PO Take 500 mg by mouth daily.    Zinc 100 MG TABS Take 1 tablet by mouth daily. (Patient not taking: Reported on 07/03/2021)   No facility-administered encounter medications on file as of 08/30/2021.  Reviewed chart prior to disease state call. Spoke with patient regarding BP  Recent Office Vitals: BP Readings from Last 3 Encounters:  07/03/21 140/78  06/05/21 (!) 156/84  05/02/21 (!) 142/80   Pulse Readings from Last 3 Encounters:  07/03/21 (!) 58  06/05/21 (!) 55  05/02/21 62    Wt Readings from Last 3 Encounters:  07/03/21 199 lb 3.2 oz (90.4 kg)  06/05/21 195 lb (88.5 kg)  05/02/21 195 lb (88.5 kg)     Kidney Function Lab Results  Component Value Date/Time   CREATININE 0.90 05/02/2021 10:12 AM   CREATININE 0.92 03/02/2020 07:32 AM   GFR 64.86 05/02/2021 10:12 AM   GFRNONAA 62 06/03/2018 11:30 AM   GFRAA 72 06/03/2018 11:30 AM    BMP Latest Ref Rng & Units 05/02/2021 03/02/2020 09/24/2019  Glucose 70 - 99 mg/dL 107(H) 121(H) 97  BUN 6 - 23  mg/dL _0 Creatinine 0.40 - 1.20 mg/dL 0.90 0.92 0.86  BUN/Creat Ratio 12 - 28 - - -  Sodium 135 - 145 mEq/L 141 138 141  Potassium 3.5 - 5.1 mEq/L 4.1 4.1 4.4  Chloride 96 - 112 mEq/L 103 100 104  CO2 19 - 32 mEq/L _1 Calcium 8.4 - 10.5 mg/dL 9.9 9.7 10.0    Current antihypertensive regimen:  Carvedilol 12.5 mg 1 tablet twice daily with a meal HCTZ 25 mg 1 tablet daily at lunch Irbesartan 150 mg 1 tablet in the morning and at night How often are you checking your Blood Pressure? 3-5x per week Current home BP readings: 142/80  Adherence  Review: Is the patient currently on ACE/ARB medication? Yes Does the patient have >5 day gap between last estimated fill dates? No  Reviewed chart for medication changes ahead of medication coordination call.  No OVs, Consults, or hospital visits since last care coordination call/Pharmacist visit.   No medication changes indicated  BP Readings from Last 3 Encounters:  07/03/21 140/78  06/05/21 (!) 156/84  05/02/21 (!) 142/80    Lab Results  Component Value Date   HGBA1C 6.6 (H) 05/02/2021     Patient obtains medications through Vials  90 Days   Last adherence delivery included:  Carvedilol 12.5 mg: one tablet twice daily at breakfast and dinner Citalopram 10 mg:  tablet daily at breakfast   Famotidine 20 mg: one tablet daily at dinner  Rosuvastatin 10 mg: one tablet every other day at dinner Potassium Chloride 10 MEQ: one tablet daily at dinner Vitamin D3 2000 units daily: one tablet daily at dinner Turmeric 500 mg: one tablet daily at dinner [facility] Adult Multivitamin 50+ 1 capsule daily: one tablet at breakfast Levothyroxine 50 MCG: one tablet at dinner Hydrochlorothiazide 25 MG tablet: one tablet at breakfast DOTTI 0.05 MG/24HR patch Irbesartan 150 mg: Take 1 tablet by mouth at breakfast and at dinner Trelegy Ellipta 200-62.5-25: one puff into lung daily Freestyle Libre 14-day Sensor kit: use as directed Rhinocort Allergy 32 mcg/act    Patient declined the following medications due to PRN use. Albuterol (PROAIR HFA) 108 (90 Base) MCG/ACT inhaler Fish oil - no longer taking   Coordinate short fill of Wellbutrin 150 mg to be delivered in one month to line up with other medications (she picked up 30 days supply at outside pharmacy).    Patient is due for next adherence delivery on: 09/11/21. Called patient and reviewed medications and coordinated delivery. Confirmed Packaging for 90 DS   This delivery to include: Famotidine (pepcid) 20 mg: one tablet  daily at dinner Levothyroxine (synthroid) 50 MCG: one tablet at dinner Citalopram (celexa) 10 mg:  tablet daily at breakfast   Irbesartan (avapro)150 mg: Take 1 tablet by mouth at breakfast and at dinner Potassium Chloride (Klor-Con) 10 MEQ: one tablet daily at dinner Carvedilol (Coreg)12.5 mg: one tablet twice daily at breakfast and dinner Vitamin D3 2000 units daily: one tablet daily at dinner [place] Adult Multivitamin 50+ 1 capsule daily: one tablet at breakfast Turmeric 500 mg: one tablet daily at dinner Hydrochlorothiazide 25 MG tablet: one tablet at breakfast Rosuvastatin 10 mg: one tablet every other day at dinner Freestyle Libre Sensors: change every 14 days    Patient declined the following medications (meds) due to (reason) Trelegy Ellipta 200-62.5-25: one puff into lung daily  ;on hand supply Estradiol (Vivelle-Dot) 0.05 mg 24 hr patch: one patch onto skin twice  a week ; on hand supply Albuterol (PROAIR HFA) 108 (90 Base) MCG/ACT inhaler ; on hand supply Wellbutrin 150 mg : not using currently  Patient needs refills for   Triamcinolone (Nasacort) 55 mcg ; one spray once daily  Confirmed delivery date of 09/11/21, advised patient that pharmacy will contact her the morning of delivery.   Care Gaps: AWV- 6/22 CCM- 2/23 Zoster Vaccines - Overdue COVID Booster #4 - Overdue TDAP - Overdue FLU Vaccine - Overdue BP- 142/80 (Home 11/10) will check back with the patient in a few weeks for reading  Star Rating Drugs: Ibesartan (Avapro) 150 mg - Last filled 06/11/21 90 DS at [facility] Rosuvastatin (Crestor) 10 mg - Last filled 06/11/21 89 DS at [facility] Pharmacist Assistant [phone]

## 2021-09-03 DIAGNOSIS — E119 Type 2 diabetes mellitus without complications: Secondary | ICD-10-CM | POA: Diagnosis not present

## 2021-09-03 LAB — HM DIABETES EYE EXAM

## 2021-09-04 DIAGNOSIS — M25551 Pain in right hip: Secondary | ICD-10-CM | POA: Diagnosis not present

## 2021-09-06 NOTE — Progress Notes (Signed)
Call back to check in again per [place] report with patient on her blood pressures she reports her reading as of yesterday was around the same as last time we spoke was 140/80 she reports her PCP is aware and she is to se her cardiologist in March as well, re-confirmed her delivery date of 11/22 from the pharmacy and she was aware.  [place] Clinical Pharmacist Assistant [phone]

## 2021-09-25 ENCOUNTER — Telehealth: Payer: Self-pay

## 2021-09-25 NOTE — Telephone Encounter (Signed)
Noted  

## 2021-09-25 NOTE — Telephone Encounter (Signed)
Patient calling in with respiratory symptoms: Shortness of breath, chest pain, palpitations or other red words send to Triage  Does the patient have a fever over 100, cough, congestion, sore throat, runny nose, lost of taste/smell within the last 5 days (please list symptoms that patient has)? Yes cough  Have you tested for Covid in the last 5 days? No

## 2021-09-26 ENCOUNTER — Encounter: Payer: Self-pay | Admitting: Adult Health

## 2021-09-26 ENCOUNTER — Ambulatory Visit (INDEPENDENT_AMBULATORY_CARE_PROVIDER_SITE_OTHER): Payer: PPO | Admitting: Adult Health

## 2021-09-26 VITALS — BP 160/90 | HR 69 | Temp 97.0°F | Ht 64.0 in | Wt 199.2 lb

## 2021-09-26 DIAGNOSIS — J988 Other specified respiratory disorders: Secondary | ICD-10-CM | POA: Diagnosis not present

## 2021-09-26 DIAGNOSIS — R051 Acute cough: Secondary | ICD-10-CM | POA: Diagnosis not present

## 2021-09-26 DIAGNOSIS — J019 Acute sinusitis, unspecified: Secondary | ICD-10-CM | POA: Diagnosis not present

## 2021-09-26 DIAGNOSIS — R7303 Prediabetes: Secondary | ICD-10-CM | POA: Diagnosis not present

## 2021-09-26 LAB — POC COVID19 BINAXNOW: SARS Coronavirus 2 Ag: NEGATIVE

## 2021-09-26 MED ORDER — METFORMIN HCL 500 MG PO TABS: 250.0000 mg | ORAL_TABLET | Freq: Every day | ORAL | 0 refills | Status: DC

## 2021-09-26 MED ORDER — AZITHROMYCIN 250 MG PO TABS: ORAL_TABLET | ORAL | 0 refills | Status: AC

## 2021-09-26 MED ORDER — BENZONATATE 200 MG PO CAPS: 200.0000 mg | ORAL_CAPSULE | Freq: Two times a day (BID) | ORAL | 2 refills | Status: DC | PRN

## 2021-09-26 NOTE — Progress Notes (Signed)
Subjective:    Patient ID: Amber Brewer, female    DOB: [birth year], 70 y.o.   MRN: [id number]  HPI  70 year old female who  has a past medical history of Asthma, Chicken pox, DDD (degenerative disc disease), cervical, Depression, Gallstones, GERD (gastroesophageal reflux disease), melanoma in situ (2002), UTI (urinary tract infection), Hyperlipidemia, Hypertension, Hypothyroidism, IBS (irritable bowel syndrome), Melanoma ([place]), Nephrolithiasis, Osteoarthritis, PVC's (premature ventricular contractions), and Status post dilation of esophageal narrowing.  She presents to the office today for an acute issue.  Symptoms have been present for 12 days.  Symptoms started with a sore throat and then progressed into chest congestion, cough, wheezing, some mild sinus pain and pressure with teeth pain.  Shortness of breath, wheezing, and cough are worse at night.  She has been using Robitussin every 4 hours for roughly 12 days.  She denies fevers or chills.  In addition, she also reports that her blood sugars have been running higher in the 140s to 170s, this happened before she got sick.  She did have a steroid injection into her hip back in October and since then blood sugars have been more elevated than normal.  She does have a history of prediabetes was on metformin but was able to come off of this.  She has a follow-up appointment relating to her elevated glucose next month.  Review of Systems See HPI   Past Medical History:  Diagnosis Date   Asthma    Chicken pox    DDD (degenerative disc disease), cervical    Depression    Gallstones    GERD (gastroesophageal reflux disease)    Hx of melanoma in situ 2002   Hx: UTI (urinary tract infection)    Hyperlipidemia    Hypertension    Hypothyroidism    IBS (irritable bowel syndrome)    Melanoma ([facility])    x2    Nephrolithiasis    Osteoarthritis    PVC's (premature ventricular contractions)    Status post dilation of esophageal narrowing      Social History   Socioeconomic History   Marital status: Married    Spouse name: Not on file   Number of children: 2   Years of education: 13   Highest education level: Not on file  Occupational History   Occupation: licensed optician/retired    Employer: OTHER    Comment: Office manager  Tobacco Use   Smoking status: Never   Smokeless tobacco: Never  Vaping Use   Vaping Use: Never used  Substance and Sexual Activity   Alcohol use: No   Drug use: No   Sexual activity: Yes    Birth control/protection: Post-menopausal, Surgical  Other Topics Concern   Not on file  Social History Narrative   epworth sleepiness scale = 8 (01/18/2016)   Lives at home with her husband and disabled son   Right handed   Caffeine: decaf   Social Determinants of Health   Financial Resource Strain: Not on file  Food Insecurity: No Food Insecurity   Worried About Charity fundraiser in the Last Year: Never true   Ran Out of Food in the Last Year: Never true  Transportation Needs: No Transportation Needs   Lack of Transportation (Medical): No   Lack of Transportation (Non-Medical): No  Physical Activity: Insufficiently Active   Days of Exercise per Week: 3 days   Minutes of Exercise per Session: 30 min  Stress: Not on file  Social Connections: Moderately Integrated  Frequency of Communication with Friends and Family: Three times a week   Frequency of Social Gatherings with Friends and Family: Three times a week   Attends Religious Services: More than 4 times per year   Active Member of Clubs or Organizations: No   Attends Archivist Meetings: Never   Marital Status: Married  Human resources officer Violence: Not At Risk   Fear of Current or Ex-Partner: No   Emotionally Abused: No   Physically Abused: No   Sexually Abused: No    Past Surgical History:  Procedure Laterality Date   ABDOMINAL HYSTERECTOMY  [place]   BREAST EXCISIONAL  BIOPSY     CATARACT EXTRACTION Bilateral 2015   CHOLECYSTECTOMY  1987   removal of melanoma in situ      Family History  Problem Relation Age of Onset   Hypertension Mother    Thyroid disease Mother    Arthritis Mother    Depression Mother    Hearing loss Mother    Miscarriages / Stillbirths Mother    Heart attack Father    Heart disease Father    Alcohol abuse Father    Arthritis Father    Depression Father    Drug abuse Father    Hypertension Father    Alcohol abuse Sister    Arthritis Sister    Depression Sister    Hearing loss Sister    Hypotension Sister    Hypertension Maternal Aunt    Hypertension Maternal Uncle    Clotting disorder Maternal Uncle    Prostate cancer Maternal Uncle    Colon cancer Maternal Uncle    Colon polyps Maternal Uncle    Diabetes Maternal Uncle        all uncles   Arthritis Maternal Grandmother    Asthma Maternal Grandmother    Breast cancer Maternal Grandmother    Stomach cancer Maternal Grandmother    Hearing loss Maternal Grandmother    Diabetes Maternal Grandfather    Heart disease Maternal Grandfather    Arthritis Maternal Grandfather    Hearing loss Maternal Grandfather    Hypertension Maternal Grandfather    Kidney disease Maternal Grandfather    Hyperlipidemia Paternal Grandmother        also MI   Breast cancer Paternal Grandmother    Arthritis Paternal Grandmother    Heart attack Paternal Grandmother    Hyperlipidemia Paternal Grandfather        also MI   Heart disease Paternal Grandfather    Heart attack Paternal Grandfather    Depression Sister    Heart disease Sister    Hyperlipidemia Sister    Hypertension Son    Endometriosis Daughter     Allergies  Allergen Reactions   Codeine Phosphate Nausea And Vomiting    REACTION: unspecified   Prednisone Shortness Of Breath   Betadine [Povidone Iodine] Hives    After surgical skin prep   Sulfa Antibiotics Nausea And Vomiting   Iodine Rash    Broke out on skin  once years ago--**Betadine Skin Prep**    Current Outpatient Medications on File Prior to Visit  Medication Sig Dispense Refill   albuterol (PROAIR HFA) 108 (90 Base) MCG/ACT inhaler Inhale 1-2 puffs into the lungs every 4 (four) hours as needed. 54 g 0   budesonide (RHINOCORT AQUA) 32 MCG/ACT nasal spray Place 1 spray into both nostrils daily as needed for allergies.      buPROPion (WELLBUTRIN XL) 150  MG 24 hr tablet Take 1 tablet (150 mg total) by mouth daily. 90 tablet 1   buPROPion (WELLBUTRIN XL) 150 MG 24 hr tablet Take 1 tablet (150 mg total) by mouth daily. (Patient not taking: Reported on 07/03/2021) 30 tablet 0   carvedilol (COREG) 12.5 MG tablet TAKE ONE TABLET BY MOUTH EVERY MORNING and TAKE ONE TABLET BY MOUTH EVERY EVENING 180 tablet 3   Cholecalciferol (VITAMIN D) 50 MCG (2000 UT) tablet Take 2,000 Units by mouth daily.      citalopram (CELEXA) 10 MG tablet TAKE 1/2 TABLET BY MOUTH DAILY 45 tablet 2   Continuous Blood Gluc Sensor (FREESTYLE LIBRE 14 DAY SENSOR) MISC 1 Device by Does not apply route every 14 (fourteen) days. 6 each 3   cycloSPORINE (RESTASIS) 0.05 % ophthalmic emulsion Place 1 drop into both eyes 2 (two) times daily.     estradiol (VIVELLE-DOT) 0.05 MG/24HR patch Place ONE PATCH onto THE SKIN twice A WEEK 8 patch 2   famotidine (PEPCID) 20 MG tablet TAKE ONE TABLET BY MOUTH EVERY EVENING 90 tablet 1   hydrochlorothiazide (HYDRODIURIL) 25 MG tablet TAKE ONE TABLET BY MOUTH EVERY MORNING 90 tablet 1   irbesartan (AVAPRO) 150 MG tablet TAKE TWO TABLETS BY MOUTH DAILY 180 tablet 1   levothyroxine (SYNTHROID) 50 MCG tablet TAKE ONE TABLET BY MOUTH BEFORE BREAKFAST 90 tablet 2   Multiple Vitamins-Minerals (ALIVE WOMENS GUMMY PO) Take 1 tablet by mouth daily.     multivitamin-lutein (OCUVITE-LUTEIN) CAPS capsule Take 1 capsule by mouth daily.     potassium chloride (KLOR-CON) 10 MEQ tablet TAKE ONE TABLET BY MOUTH EVERY EVENING 90 tablet 3   rosuvastatin (CRESTOR) 10 MG  tablet Take 10 mg by mouth every other day.     TRELEGY ELLIPTA 200-62.5-25 MCG/INH AEPB INHALE 1 PUFF BY MOUTH INTO LUNGS DAILY 180 each 1   triamcinolone (NASACORT) 55 MCG/ACT AERO nasal inhaler INSTILL 1 SPRAY ONCE DAILY 1 mL 12   TURMERIC PO Take 500 mg by mouth daily.      Zinc 100 MG TABS Take 1 tablet by mouth daily. (Patient not taking: Reported on 07/03/2021)     No current facility-administered medications on file prior to visit.    BP (!) 160/90   Pulse 69   Temp (!) 97 F (36.1 C) (Oral)   Ht 5\' 4"  (1.626 m)   Wt 199 lb 3.2 oz (90.4 kg)   SpO2 96%   BMI 34.19 kg/m       Objective:   Physical Exam Vitals and nursing note reviewed.  Constitutional:      Appearance: Normal appearance.  HENT:     Nose: Congestion present.     Right Turbinates: Enlarged and swollen.     Left Turbinates: Enlarged and swollen.     Right Sinus: No maxillary sinus tenderness or frontal sinus tenderness.     Left Sinus: No maxillary sinus tenderness or frontal sinus tenderness.  Cardiovascular:     Rate and Rhythm: Normal rate and regular rhythm.     Pulses: Normal pulses.     Heart sounds: Normal heart sounds.  Pulmonary:     Effort: Pulmonary effort is normal.     Breath sounds: Wheezing (slight expiratory wheeze) present.  Skin:    General: Skin is warm and dry.  Neurological:     General: No focal deficit present.     Mental Status: She is alert and oriented to person, place, and time.  Psychiatric:  Mood and Affect: Mood normal.        Behavior: Behavior normal.        Thought Content: Thought content normal.        Judgment: Judgment normal.      Assessment & Plan:  1. Respiratory infection -We will treat with azithromycin for respiratory infection/sinusitis.  Also send in Wylie to help with her cough.  She was encouraged to use an albuterol inhaler that she has prescribed to her but has not started using yet.  We will try away from oral steroids at this  time due to elevated blood sugars.  She was advised to follow-up in 3 to 4 days if no improvement - POC COVID-19 BinaxNow- negative  - azithromycin (ZITHROMAX) 250 MG tablet; Take 2 tablets on day 1, then 1 tablet daily on days 2 through 5  Dispense: 6 tablet; Refill: 0 - benzonatate (TESSALON) 200 MG capsule; Take 1 capsule (200 mg total) by mouth 2 (two) times daily as needed for cough.  Dispense: 20 capsule; Refill: 2  2. Pre-diabetes  - metFORMIN (GLUCOPHAGE) 500 MG tablet; Take 0.5 tablets (250 mg total) by mouth daily with breakfast.  Dispense: 45 tablet; Refill: 0  3. Acute cough  - azithromycin (ZITHROMAX) 250 MG tablet; Take 2 tablets on day 1, then 1 tablet daily on days 2 through 5  Dispense: 6 tablet; Refill: 0 - benzonatate (TESSALON) 200 MG capsule; Take 1 capsule (200 mg total) by mouth 2 (two) times daily as needed for cough.  Dispense: 20 capsule; Refill: 2  4. Acute non-recurrent sinusitis, unspecified location  - azithromycin (ZITHROMAX) 250 MG tablet; Take 2 tablets on day 1, then 1 tablet daily on days 2 through 5  Dispense: 6 tablet; Refill: 0  [name], NP

## 2021-10-08 DIAGNOSIS — I10 Essential (primary) hypertension: Secondary | ICD-10-CM | POA: Diagnosis not present

## 2021-10-08 DIAGNOSIS — Z79899 Other long term (current) drug therapy: Secondary | ICD-10-CM | POA: Diagnosis not present

## 2021-10-08 DIAGNOSIS — R001 Bradycardia, unspecified: Secondary | ICD-10-CM | POA: Diagnosis not present

## 2021-10-09 ENCOUNTER — Telehealth: Payer: Self-pay | Admitting: Internal Medicine

## 2021-10-09 DIAGNOSIS — I10 Essential (primary) hypertension: Secondary | ICD-10-CM | POA: Diagnosis not present

## 2021-10-09 NOTE — Telephone Encounter (Signed)
Spoke with patient. She reports while at work yesterday, she felt weak, had a tingling sensation and wanted to pass out. Her blood pressure was 212/87. Her husband took her to the ER. She was prescribed amlodipine 5 mg daily. Her blood pressure this morning was 175/85. She denies chest pain, headache, or visual problems. She accepted an appointment for Dr. Debara Pickett on 10/17/21 at 9:30 am. Please advise regarding blood pressure.

## 2021-10-09 NOTE — Telephone Encounter (Signed)
Spoke with patient again. She reports that 3 hours after taking her medications, her blood pressure is 190/87. She denies any pain, headache, or vision issues. I recommended that she go to the emergency room so they can control her blood pressure. She is getting someone to drive her. If she doesn't find anyone, I told her to call 911 to drive her to ER. Voiced understanding of this conversation.

## 2021-10-09 NOTE — Telephone Encounter (Signed)
Pt would like another callback regarding what she should do about her BP getting higher. Pt's BP is currently 190/87

## 2021-10-09 NOTE — Telephone Encounter (Signed)
Pt c/o BP issue: STAT if pt c/o blurred vision, one-sided weakness or slurred speech  1. What are your last 5 BP readings? 175/87 HR 67  2. Are you having any other symptoms (ex. Dizziness, headache, blurred vision, passed out)? NO  3. What is your BP issue? PT WAS SEEN AT [facility] YESTERDAY FOR 6 HOURS FOR HYPERTENSION, PT WAS PRESCRIBED AMLODOPINE 5 MG, PT TOOK THE MEDICINE 15 MINUTES AGO AND HER BP IS STILL HIGH 175/87. PT STATES SHE WAS TOLD TO SEE [facility] ASAP, I ATTEMPTED TO SCHEDULE WITH THE FIRST AVAILABLE AT [facility] BUT PT REFUSED ALL APPOINTMENTS. SHE WOULD LIKE A CALLBACK FROM [facility]'S NURSE TO GET A SOONER APPT

## 2021-10-17 ENCOUNTER — Encounter: Payer: Self-pay | Admitting: Internal Medicine

## 2021-10-17 ENCOUNTER — Ambulatory Visit: Payer: PPO | Admitting: Internal Medicine

## 2021-10-17 ENCOUNTER — Other Ambulatory Visit: Payer: Self-pay

## 2021-10-17 VITALS — BP 127/72 | HR 63 | Ht 64.0 in | Wt 200.2 lb

## 2021-10-17 DIAGNOSIS — E782 Mixed hyperlipidemia: Secondary | ICD-10-CM | POA: Diagnosis not present

## 2021-10-17 DIAGNOSIS — I493 Ventricular premature depolarization: Secondary | ICD-10-CM

## 2021-10-17 DIAGNOSIS — I10 Essential (primary) hypertension: Secondary | ICD-10-CM

## 2021-10-17 NOTE — Patient Instructions (Signed)
Medication Instructions:  The current medical regimen is effective;  continue present plan and medications.  *If you need a refill on your cardiac medications before your next appointment, please call your pharmacy*  Follow-Up: At [facility], you and your health needs are our priority.  As part of our continuing mission to provide you with exceptional heart care, we have created designated Provider Care Teams.  These Care Teams include your primary Cardiologist (physician) and Advanced Practice Providers (APPs -  Physician Assistants and Nurse Practitioners) who all work together to provide you with the care you need, when you need it.  We recommend signing up for the patient portal called "MyChart".  Sign up information is provided on this After Visit Summary.  MyChart is used to connect with patients for Virtual Visits (Telemedicine).  Patients are able to view lab/test results, encounter notes, upcoming appointments, etc.  Non-urgent messages can be sent to your provider as well.   To learn more about what you can do with MyChart, go to [email or web address].    Your next appointment:   6 month(s)  The format for your next appointment:   In Person  Provider:   Dr.Hilty

## 2021-10-17 NOTE — Progress Notes (Signed)
OFFICE NOTE  Chief Complaint:  Follow-up hypertension  Primary Care Physician: [name], NP  HPI:  Amber Brewer is a 70 y.o. female with a past medical history significant for hypertension, dyslipidemia, asthma/allergies, GERD, Schatzki's ring and prior esophageal dilatation about 5 years ago.  She now presents with recent episodes of squeezing in the chest which feel episodic and spasmodic. She is concerned that this may be esophageal spasm however her primary care provider wanted to rule out possible coronary disease. She feels a tightness in her chest that is not necessarily associated with eating or present with exertion or relieved by rest. She is also short of breath, particularly when walking up hills. The tightness is been coming on more recently. She's also been under significant stress and does have some history of anxiety. She is caring for disabled son and has an 74 year old mother who she is caring for. 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 She reports that she does have some mild trouble swallowing and occasionally food does get stuck in her mid throat and often times pills are difficult to swallow and may come up. She also reported a brief presyncopal episode however this is with significantly rotating her head about 90 the cause that quick decrease in vision which improved when straightening her head out. She does have a history of neck injury in the past.  02/26/2016  Amber Brewer returns today for follow-up of her nuclear stress test. This is an exercise study in which no ischemia was noted. LVEF was greater than 65%. She did have a hypertensive response to exercise with a peak systolic blood pressure 010. Blood pressure appears that is not as well-controlled as it should be. We discussed her chest discomfort which she says she is had some improvement in. Given her prior esophageal dilatation history and Schatzki's ring, she may be having  esophageal spasm. I asked her to follow-up with her gastroenterologist about this. She may need manometry. If this is the case then she may benefit from addition of low-dose calcium channel blocker. She does not want to add any extra medication at this time, however blood pressure is suboptimally controlled.  05/27/2016  Amber Brewer returns today for follow-up. She continues to have some anxiety episodes. This is associated with some throat tightness. She also has a history of esophageal stricture and Schatzki's ring. She has made an appointment with her gastroenterologist but that is not until September. It's unclear whether these episodes are GI related or anxiety. I recently increased her Bystolic to 7.5 mg daily. Her home blood pressure readings indicate good control.  11/08/2015  Amber Brewer was seen today as an add-on for feeling faint and having palpitations. An EKG in the office shows frequent unifocal PVCs. This is symptomatically similar to what she had when I saw her back in August however she is having more frequent episodes. She is also under significant stress. Her husband recently had intracranial hemorrhage and had emergency brain surgery. She also takes care of her elderly mother and disabled child. This causes a lot of stress in her life. She's had asthma however she is on inhaled steroid and rarely uses a beta agonist. She is on Bystolic however that has very poor ability to block PVCs. LV function was normal this past year in April 2017 by stress test without any ischemia, therefore not concerned about these being ischemic PVCs.  11/11/2016  Amber Brewer returns today for follow-up. She  reports symptomatic improvement in her palpitations on carvedilol. Blood pressure is low normal. She was getting a little dizzy and move her blood pressure medicines to the middle of the day. She reports about 50% improvement in her palpitations. She says she is breathing better and has more  energy.  12/19/2016  Amber Brewer is seen today in follow-up. Overall she feels much better. She is actually made adjustments or other medication at home. She felt like a higher dose of carvedilol was robbing her of energy. Not quite as bad as being on diastolic but certainly worse. She therefore decreased the dose and currently is only taking 12.5 mg every morning. She subsequently increase the dose of her irbesartan up to 300 mg daily. Blood pressure control is been excellent with most readings between 676 and 195 systolic. She denies any worsening palpitations. She is generally pleased with how she feels on this current medication regimen. I did report to her that carvedilol is typically twice daily medicine because of the short half-life and we discussed the possibility of dividing the dose twice daily.  06/17/2017  Amber Brewer is seen today in follow-up. She reports improvement in her palpitations on carvedilol however they're not as well-controlled as they have been in the past. She's previously been on an increased dose of the carvedilol and felt that it was robbing her energy. Is difficult to find a balance between the 2 medications. She's been struggling with vestibular problems and is undergoing neuro physical therapy. She says this is been helpful for her.  01/16/2021  Amber Brewer returns today for follow-up.  I saw her last via telemedicine visit.  I had recommended increasing her irbesartan to 300 mg daily.  She notes that at home her blood pressures generally in the 093O 671 systolic range although was more elevated today 142/73.  She also has lost a significant amount of weight.  Some of this was intentional and some of it not because she had COVID-19 in January and she was taking care of her mother who ultimately died of that.  Her cholesterol however is come down substantially.  Total was 154, triglycerides 67 HDL 56 and LDL of 73, this is down from 171 previously.  She was taken the  rosuvastatin 10 mg daily but was advised by her PCP to drop that back to once weekly.  I do think we could cut back on it however I would be hesitant to say that this was all from diet and it will likely increase with a significant reduction in her rosuvastatin.   07/03/2021  Amber Brewer is seen today in follow-up.  Overall she says she feels very well.  She does not have any significant palpitations in some time.  EKG today shows no PVCs.  Weight is unfortunately crept up some as is her blood pressure.  Today it was initially elevated however recheck came down to 140/78.  She said this is more in line with her home readings.  Cholesterol however just recently checked is still pretty good with total 137, HDL 52, triglycerides 104 and LDL 66.  Her A1c was 5.4 and has come up to 6.6 as of July.  This has been associated with weight gain.  This past year has been difficult with several losses in her family including her mother and she contracted COVID-34.  She is now just starting to feel that she could become more active and work on weight loss.  10/17/2021  Amber Brewer seen today in follow-up.  Recently she has had some issues with hypertension.  She had some systolic blood pressures over 200 and was referred to the emergency department.  This required 2 ER visits and ultimately she was placed on higher dose amlodipine as she cannot get an appointment with our office.  She says that she has been on the higher dose amlodipine she seems to be tolerating this pretty well.  Its been about a week on that medication.  She has developed some lower extremity edema by the end of the day but is tolerable.  Blood pressure today in the office was 127/72.  I have encouraged her to continue to follow this as the medication should be kicking in and around this point.  PMHx:  Past Medical History:  Diagnosis Date   Asthma    Chicken pox    DDD (degenerative disc disease), cervical    Depression    Gallstones     GERD (gastroesophageal reflux disease)    Hx of melanoma in situ 2002   Hx: UTI (urinary tract infection)    Hyperlipidemia    Hypertension    Hypothyroidism    IBS (irritable bowel syndrome)    Melanoma (HCC)    x2    Nephrolithiasis    Osteoarthritis    PVC's (premature ventricular contractions)    Status post dilation of esophageal narrowing     Past Surgical History:  Procedure Laterality Date   ABDOMINAL HYSTERECTOMY  1974   APPENDECTOMY  1977   BLADDER SURGERY  1974   BREAST EXCISIONAL BIOPSY     CATARACT EXTRACTION Bilateral 2015   CHOLECYSTECTOMY  1987   removal of melanoma in situ      FAMHx:  Family History  Problem Relation Age of Onset   Hypertension Mother    Thyroid disease Mother    Arthritis Mother    Depression Mother    Hearing loss Mother    Miscarriages / Stillbirths Mother    Heart attack Father    Heart disease Father    Alcohol abuse Father    Arthritis Father    Depression Father    Drug abuse Father    Hypertension Father    Alcohol abuse Sister    Arthritis Sister    Depression Sister    Hearing loss Sister    Hypotension Sister    Hypertension Maternal Aunt    Hypertension Maternal Uncle    Clotting disorder Maternal Uncle    Prostate cancer Maternal Uncle    Colon cancer Maternal Uncle    Colon polyps Maternal Uncle    Diabetes Maternal Uncle        all uncles   Arthritis Maternal Grandmother    Asthma Maternal Grandmother    Breast cancer Maternal Grandmother    Stomach cancer Maternal Grandmother    Hearing loss Maternal Grandmother    Diabetes Maternal Grandfather    Heart disease Maternal Grandfather    Arthritis Maternal Grandfather    Hearing loss Maternal Grandfather    Hypertension Maternal Grandfather    Kidney disease Maternal Grandfather    Hyperlipidemia Paternal Grandmother        also MI   Breast cancer Paternal Grandmother    Arthritis Paternal Grandmother    Heart attack Paternal Grandmother     Hyperlipidemia Paternal Grandfather        also MI   Heart disease Paternal Grandfather    Heart attack Paternal Grandfather    Depression Sister    Heart disease Sister  Hyperlipidemia Sister    Hypertension Son    Endometriosis Daughter     SOCHx:   reports that she has never smoked. She has never used smokeless tobacco. She reports that she does not drink alcohol and does not use drugs.  ALLERGIES:  Allergies  Allergen Reactions   Codeine Phosphate Nausea And Vomiting    REACTION: unspecified   Prednisone Shortness Of Breath   Betadine [Povidone Iodine] Hives    After surgical skin prep   Sulfa Antibiotics Nausea And Vomiting   Iodine Rash    Broke out on skin once years ago--**Betadine Skin Prep**    ROS: Pertinent items noted in HPI and remainder of comprehensive ROS otherwise negative.  HOME MEDS: Current Outpatient Medications  Medication Sig Dispense Refill   albuterol (PROAIR HFA) 108 (90 Base) MCG/ACT inhaler Inhale 1-2 puffs into the lungs every 4 (four) hours as needed. 54 g 0   amLODipine (NORVASC) 10 MG tablet Take 10 mg by mouth daily.     benzonatate (TESSALON) 200 MG capsule Take 1 capsule (200 mg total) by mouth 2 (two) times daily as needed for cough. 20 capsule 2   budesonide (RHINOCORT AQUA) 32 MCG/ACT nasal spray Place 1 spray into both nostrils daily as needed for allergies.      buPROPion (WELLBUTRIN XL) 150 MG 24 hr tablet Take 1 tablet (150 mg total) by mouth daily. 90 tablet 1   carvedilol (COREG) 12.5 MG tablet TAKE ONE TABLET BY MOUTH EVERY MORNING and TAKE ONE TABLET BY MOUTH EVERY EVENING 180 tablet 3   Cholecalciferol (VITAMIN D) 50 MCG (2000 UT) tablet Take 2,000 Units by mouth daily.      citalopram (CELEXA) 10 MG tablet TAKE 1/2 TABLET BY MOUTH DAILY 45 tablet 2   Continuous Blood Gluc Sensor (FREESTYLE LIBRE 14 DAY SENSOR) MISC 1 Device by Does not apply route every 14 (fourteen) days. 6 each 3   cycloSPORINE (RESTASIS) 0.05 % ophthalmic  emulsion Place 1 drop into both eyes 2 (two) times daily.     estradiol (VIVELLE-DOT) 0.05 MG/24HR patch Place ONE PATCH onto THE SKIN twice A WEEK 8 patch 2   famotidine (PEPCID) 20 MG tablet TAKE ONE TABLET BY MOUTH EVERY EVENING 90 tablet 1   hydrochlorothiazide (HYDRODIURIL) 25 MG tablet TAKE ONE TABLET BY MOUTH EVERY MORNING 90 tablet 1   irbesartan (AVAPRO) 150 MG tablet TAKE TWO TABLETS BY MOUTH DAILY 180 tablet 1   levothyroxine (SYNTHROID) 50 MCG tablet TAKE ONE TABLET BY MOUTH BEFORE BREAKFAST 90 tablet 2   metFORMIN (GLUCOPHAGE) 500 MG tablet Take 0.5 tablets (250 mg total) by mouth daily with breakfast. 45 tablet 0   Multiple Vitamins-Minerals (ALIVE WOMENS GUMMY PO) Take 1 tablet by mouth daily.     multivitamin-lutein (OCUVITE-LUTEIN) CAPS capsule Take 1 capsule by mouth daily.     potassium chloride (KLOR-CON) 10 MEQ tablet TAKE ONE TABLET BY MOUTH EVERY EVENING 90 tablet 3   rosuvastatin (CRESTOR) 10 MG tablet Take 10 mg by mouth every other day.     TRELEGY ELLIPTA 200-62.5-25 MCG/INH AEPB INHALE 1 PUFF BY MOUTH INTO LUNGS DAILY 180 each 1   triamcinolone (NASACORT) 55 MCG/ACT AERO nasal inhaler INSTILL 1 SPRAY ONCE DAILY 1 mL 12   TURMERIC PO Take 500 mg by mouth daily.      Zinc 100 MG TABS Take 1 tablet by mouth daily.     No current facility-administered medications for this visit.    LABS/IMAGING: No results found  for this or any previous visit (from the past 48 hour(s)). No results found.  WEIGHTS: Wt Readings from Last 3 Encounters:  10/17/21 200 lb 3.2 oz (90.8 kg)  09/26/21 199 lb 3.2 oz (90.4 kg)  07/03/21 199 lb 3.2 oz (90.4 kg)    VITALS: BP 127/72    Pulse 63    Ht 5\' 4"  (1.626 m)    Wt 200 lb 3.2 oz (90.8 kg)    SpO2 98%    BMI 34.36 kg/m   EXAM: General appearance: alert and no distress Neck: no carotid bruit and no JVD Lungs: clear to auscultation bilaterally Heart: regular rate and rhythm, S1, S2 normal, no murmur, click, rub or gallop Abdomen:  soft, non-tender; bowel sounds normal; no masses,  no organomegaly Extremities: extremities normal, atraumatic, no cyanosis or edema Pulses: 2+ and symmetric Skin: Skin color, texture, turgor normal. No rashes or lesions Neurologic: Grossly normal Psych: Pleasant  EKG: Normal sinus rhythm at 63, nonspecific ST changes-personally reviewed  ASSESSMENT: Frequent symptomatic PVC's - improved Chest pain, more likely esophageal spasm - low risk exercise nuclear stress test (2017) Hypertension Dyslipidemia Neuro-vestibular imbalance  PLAN: 1.  Amber Brewer has recently had some increase in her blood pressures.  This is despite being on several medications.  She has had the addition of amlodipine which was increased from 5 to 10 mg daily by ER physicians in [facility].  She has been on the 10 mg dose for about a week and her blood pressure today was excellent.  She has developed some edema by the end of the day which I told her was typical of the medication.  Hopefully her diuretic will offset this I encouraged her to elevate her feet or consider compression stockings.  If this becomes too big of an issue may need to back down on the dose and consider alternative treatments.  Follow-up with me in 6 months or sooner as necessary.  Pixie Casino, MD, [facility], [place] Director of the [facility] Lipid [facility]  Card[facility] Diplomate of the American Board of Clinical Lipidology Attending Cardiologist  Direct Dial: [phone]   Fax: [phone]  Website:  [email or web address] 10/17/2021, 9:48 AM

## 2021-11-01 DIAGNOSIS — L719 Rosacea, unspecified: Secondary | ICD-10-CM | POA: Diagnosis not present

## 2021-11-01 DIAGNOSIS — D225 Melanocytic nevi of trunk: Secondary | ICD-10-CM | POA: Diagnosis not present

## 2021-11-01 DIAGNOSIS — Z87898 Personal history of other specified conditions: Secondary | ICD-10-CM | POA: Diagnosis not present

## 2021-11-01 DIAGNOSIS — L570 Actinic keratosis: Secondary | ICD-10-CM | POA: Diagnosis not present

## 2021-11-01 DIAGNOSIS — Z808 Family history of malignant neoplasm of other organs or systems: Secondary | ICD-10-CM | POA: Diagnosis not present

## 2021-11-01 DIAGNOSIS — D485 Neoplasm of uncertain behavior of skin: Secondary | ICD-10-CM | POA: Diagnosis not present

## 2021-11-01 DIAGNOSIS — L918 Other hypertrophic disorders of the skin: Secondary | ICD-10-CM | POA: Diagnosis not present

## 2021-11-01 DIAGNOSIS — L821 Other seborrheic keratosis: Secondary | ICD-10-CM | POA: Diagnosis not present

## 2021-11-01 DIAGNOSIS — L249 Irritant contact dermatitis, unspecified cause: Secondary | ICD-10-CM | POA: Diagnosis not present

## 2021-11-01 DIAGNOSIS — L578 Other skin changes due to chronic exposure to nonionizing radiation: Secondary | ICD-10-CM | POA: Diagnosis not present

## 2021-11-01 DIAGNOSIS — D2271 Melanocytic nevi of right lower limb, including hip: Secondary | ICD-10-CM | POA: Diagnosis not present

## 2021-11-06 ENCOUNTER — Ambulatory Visit (INDEPENDENT_AMBULATORY_CARE_PROVIDER_SITE_OTHER): Payer: PPO | Admitting: Adult Health

## 2021-11-06 ENCOUNTER — Encounter: Payer: Self-pay | Admitting: Adult Health

## 2021-11-06 VITALS — BP 120/70 | HR 59 | Temp 98.1°F | Ht 64.0 in | Wt 202.0 lb

## 2021-11-06 DIAGNOSIS — I10 Essential (primary) hypertension: Secondary | ICD-10-CM | POA: Diagnosis not present

## 2021-11-06 DIAGNOSIS — R7303 Prediabetes: Secondary | ICD-10-CM | POA: Diagnosis not present

## 2021-11-06 LAB — POCT GLYCOSYLATED HEMOGLOBIN (HGB A1C): Hemoglobin A1C: 6.3 % — AB (ref 4.0–5.6)

## 2021-11-06 MED ORDER — FREESTYLE LIBRE 3 SENSOR MISC: 1.0000 | 6 refills | Status: DC

## 2021-11-06 NOTE — Progress Notes (Signed)
Subjective:    Patient ID: [name], female    DOB: [birth year], 71 y.o.   MRN: [id number]  HPI  71 year old female who  has a past medical history of Asthma, Chicken pox, DDD (degenerative disc disease), cervical, Depression, Gallstones, GERD (gastroesophageal reflux disease), melanoma in situ (2002), UTI (urinary tract infection), Hyperlipidemia, Hypertension, Hypothyroidism, IBS (irritable bowel syndrome), Melanoma ([place]), Nephrolithiasis, Osteoarthritis, PVC's (premature ventricular contractions), and Status post dilation of esophageal narrowing.  She presents to the office today for follow up regarding hypertension and pre diabetes.   HTN -is seen by cardiology.  Was recently seen in the emergency room twice for issues with blood pressure management.  She had some systolic blood pressures over 200.  Was ultimately placed on Norvasc 5 mg and then titrated to 10 mg.  Since being on Norvasc she has developed some lower extremity edema that is more apparent towards the end of the day.  She was seen by her cardiologist in December 2022 after her ER visits recommended continuing with Norvasc 10 mg in addition to her other blood pressure medication.  She reports that her lower extremity edema was not improving despite being on HCTZ and was getting worse despite elevation and compression socks.  She cut her dose back to the 5 mg dose and has been monitoring her blood pressures over the last 2 to 3 weeks.  Per her log most of her blood pressures are in the 120s over 70s.  She has noticed some improvement in the edema but has not fully resolved.  Continues to be worse during the end of the day  Pre Diabetes - Managed with metformin 250mg  daily.  She does use the [place] system to manage her blood sugars with reported readings in the 120s.  She is trying to work on lifestyle modifications.  Last A1c 6 months ago was 6.6.   Review of Systems See HPI   Past Medical History:  Diagnosis Date    Asthma    Chicken pox    DDD (degenerative disc disease), cervical    Depression    Gallstones    GERD (gastroesophageal reflux disease)    Hx of melanoma in situ 2002   Hx: UTI (urinary tract infection)    Hyperlipidemia    Hypertension    Hypothyroidism    IBS (irritable bowel syndrome)    Melanoma ([facility])    x2    Nephrolithiasis    Osteoarthritis    PVC's (premature ventricular contractions)    Status post dilation of esophageal narrowing     Social History   Socioeconomic History   Marital status: Married    Spouse name: Not on file   Number of children: 2   Years of education: 13   Highest education level: Not on file  Occupational History   Occupation: licensed optician/retired    Employer: OTHER    Comment: Office manager  Tobacco Use   Smoking status: Never   Smokeless tobacco: Never  Vaping Use   Vaping Use: Never used  Substance and Sexual Activity   Alcohol use: No   Drug use: No   Sexual activity: Yes    Birth control/protection: Post-menopausal, Surgical  Other Topics Concern   Not on file  Social History Narrative   epworth sleepiness scale = 8 (01/18/2016)   Lives at home with her husband and disabled son   Right handed   Caffeine: decaf   Social Determinants of Radio broadcast assistant  Strain: Not on file  Food Insecurity: No Food Insecurity   Worried About Charity fundraiser in the Last Year: Never true   Ran Out of Food in the Last Year: Never true  Transportation Needs: No Transportation Needs   Lack of Transportation (Medical): No   Lack of Transportation (Non-Medical): No  Physical Activity: Insufficiently Active   Days of Exercise per Week: 3 days   Minutes of Exercise per Session: 30 min  Stress: Not on file  Social Connections: Moderately Integrated   Frequency of Communication with Friends and Family: Three times a week   Frequency of Social Gatherings with Friends and Family: Three times a week   Attends Religious  Services: More than 4 times per year   Active Member of Clubs or Organizations: No   Attends Archivist Meetings: Never   Marital Status: Married  Human resources officer Violence: Not At Risk   Fear of Current or Ex-Partner: No   Emotionally Abused: No   Physically Abused: No   Sexually Abused: No    Past Surgical History:  Procedure Laterality Date   ABDOMINAL HYSTERECTOMY  [place]   BREAST EXCISIONAL BIOPSY     CATARACT EXTRACTION Bilateral 2015   CHOLECYSTECTOMY  1987   removal of melanoma in situ      Family History  Problem Relation Age of Onset   Hypertension Mother    Thyroid disease Mother    Arthritis Mother    Depression Mother    Hearing loss Mother    Miscarriages / Stillbirths Mother    Heart attack Father    Heart disease Father    Alcohol abuse Father    Arthritis Father    Depression Father    Drug abuse Father    Hypertension Father    Alcohol abuse Sister    Arthritis Sister    Depression Sister    Hearing loss Sister    Hypotension Sister    Hypertension Maternal Aunt    Hypertension Maternal Uncle    Clotting disorder Maternal Uncle    Prostate cancer Maternal Uncle    Colon cancer Maternal Uncle    Colon polyps Maternal Uncle    Diabetes Maternal Uncle        all uncles   Arthritis Maternal Grandmother    Asthma Maternal Grandmother    Breast cancer Maternal Grandmother    Stomach cancer Maternal Grandmother    Hearing loss Maternal Grandmother    Diabetes Maternal Grandfather    Heart disease Maternal Grandfather    Arthritis Maternal Grandfather    Hearing loss Maternal Grandfather    Hypertension Maternal Grandfather    Kidney disease Maternal Grandfather    Hyperlipidemia Paternal Grandmother        also MI   Breast cancer Paternal Grandmother    Arthritis Paternal Grandmother    Heart attack Paternal Grandmother    Hyperlipidemia Paternal Grandfather        also MI   Heart  disease Paternal Grandfather    Heart attack Paternal Grandfather    Depression Sister    Heart disease Sister    Hyperlipidemia Sister    Hypertension Son    Endometriosis Daughter     Allergies  Allergen Reactions   Codeine Phosphate Nausea And Vomiting    REACTION: unspecified   Prednisone Shortness Of Breath   Betadine [Povidone Iodine] Hives    After surgical skin prep  Sulfa Antibiotics Nausea And Vomiting   Iodine Rash    Broke out on skin once years ago--**Betadine Skin Prep**    Current Outpatient Medications on File Prior to Visit  Medication Sig Dispense Refill   albuterol (PROAIR HFA) 108 (90 Base) MCG/ACT inhaler Inhale 1-2 puffs into the lungs every 4 (four) hours as needed. 54 g 0   amLODipine (NORVASC) 10 MG tablet Take 10 mg by mouth daily.     benzonatate (TESSALON) 200 MG capsule Take 1 capsule (200 mg total) by mouth 2 (two) times daily as needed for cough. 20 capsule 2   budesonide (RHINOCORT AQUA) 32 MCG/ACT nasal spray Place 1 spray into both nostrils daily as needed for allergies.      carvedilol (COREG) 12.5 MG tablet TAKE ONE TABLET BY MOUTH EVERY MORNING and TAKE ONE TABLET BY MOUTH EVERY EVENING 180 tablet 3   Cholecalciferol (VITAMIN D) 50 MCG (2000 UT) tablet Take 2,000 Units by mouth daily.      citalopram (CELEXA) 10 MG tablet TAKE 1/2 TABLET BY MOUTH DAILY 45 tablet 2   cycloSPORINE (RESTASIS) 0.05 % ophthalmic emulsion Place 1 drop into both eyes 2 (two) times daily.     estradiol (VIVELLE-DOT) 0.05 MG/24HR patch Place ONE PATCH onto THE SKIN twice A WEEK 8 patch 2   famotidine (PEPCID) 20 MG tablet TAKE ONE TABLET BY MOUTH EVERY EVENING 90 tablet 1   hydrochlorothiazide (HYDRODIURIL) 25 MG tablet TAKE ONE TABLET BY MOUTH EVERY MORNING 90 tablet 1   irbesartan (AVAPRO) 150 MG tablet TAKE TWO TABLETS BY MOUTH DAILY 180 tablet 1   levothyroxine (SYNTHROID) 50 MCG tablet TAKE ONE TABLET BY MOUTH BEFORE BREAKFAST 90 tablet 2   metFORMIN (GLUCOPHAGE)  500 MG tablet Take 0.5 tablets (250 mg total) by mouth daily with breakfast. 45 tablet 0   Multiple Vitamins-Minerals (ALIVE WOMENS GUMMY PO) Take 1 tablet by mouth daily.     multivitamin-lutein (OCUVITE-LUTEIN) CAPS capsule Take 1 capsule by mouth daily.     potassium chloride (KLOR-CON) 10 MEQ tablet TAKE ONE TABLET BY MOUTH EVERY EVENING 90 tablet 3   rosuvastatin (CRESTOR) 10 MG tablet Take 10 mg by mouth every other day.     TRELEGY ELLIPTA 200-62.5-25 MCG/INH AEPB INHALE 1 PUFF BY MOUTH INTO LUNGS DAILY 180 each 1   triamcinolone (NASACORT) 55 MCG/ACT AERO nasal inhaler INSTILL 1 SPRAY ONCE DAILY 1 mL 12   TURMERIC PO Take 500 mg by mouth daily.      Zinc 100 MG TABS Take 1 tablet by mouth daily.     No current facility-administered medications on file prior to visit.    BP 120/70    Pulse (!) 59    Temp 98.1 F (36.7 C) (Oral)    Ht 5\' 4"  (1.626 m)    Wt 202 lb (91.6 kg)    SpO2 96%    BMI 34.67 kg/m       Objective:   Physical Exam Vitals and nursing note reviewed.  Constitutional:      Appearance: Normal appearance.  Musculoskeletal:        General: Normal range of motion.     Right lower leg: 1+ Pitting Edema present.     Left lower leg: 2+ Pitting Edema present.  Skin:    General: Skin is warm and dry.     Capillary Refill: Capillary refill takes less than 2 seconds.  Neurological:     General: No focal deficit present.  Mental Status: She is alert and oriented to person, place, and time.  Psychiatric:        Mood and Affect: Mood normal.        Behavior: Behavior normal.        Thought Content: Thought content normal.        Judgment: Judgment normal.       Assessment & Plan:  1. Pre-diabetes  - Continuous Blood Gluc Sensor (FREESTYLE LIBRE 3 SENSOR) MISC; 1 Device by Does not apply route every 14 (fourteen) days. Place 1 sensor on the skin every 14 days. Use to check glucose continuously  Dispense: 2 each; Refill: 6 - POC HgB A1c- 6.3 -Follow-up in 6  months  2. Essential hypertension -BP well controlled.  We discussed continued observation for the next 2 weeks while on her Norvasc 5 mg to see if edema improves.  She is okay with this, will send me a MyChart message in 2 weeks.  If she continues to have edema in her lower extremities and we can consider changing HCTZ to chlorthalidone.  [name], NP

## 2021-11-17 ENCOUNTER — Encounter: Payer: Self-pay | Admitting: Internal Medicine

## 2021-11-17 ENCOUNTER — Encounter: Payer: Self-pay | Admitting: Adult Health

## 2021-11-19 NOTE — Telephone Encounter (Signed)
FYI

## 2021-11-24 ENCOUNTER — Other Ambulatory Visit: Payer: Self-pay | Admitting: Internal Medicine

## 2021-11-26 MED ORDER — AMLODIPINE BESYLATE 10 MG PO TABS: 10.0000 mg | ORAL_TABLET | Freq: Every day | ORAL | 2 refills | Status: DC

## 2021-11-28 ENCOUNTER — Other Ambulatory Visit: Payer: Self-pay | Admitting: Adult Health

## 2021-11-29 ENCOUNTER — Telehealth: Payer: Self-pay | Admitting: Internal Medicine

## 2021-11-29 ENCOUNTER — Other Ambulatory Visit: Payer: Self-pay

## 2021-11-29 ENCOUNTER — Telehealth: Payer: Self-pay | Admitting: Pharmacist

## 2021-11-29 MED ORDER — AMLODIPINE BESYLATE 10 MG PO TABS: 10.0000 mg | ORAL_TABLET | Freq: Every day | ORAL | 1 refills | Status: DC

## 2021-11-29 NOTE — Telephone Encounter (Signed)
Pt c/o medication issue:  1. Name of Medication: amLODipine (NORVASC) 10 MG tablet  2. How are you currently taking this medication (dosage and times per day)? Take 1 tablet (10 mg total) by mouth daily.  3. Are you having a reaction (difficulty breathing--STAT)? No   4. What is your medication issue? Need new 90 day prescription sent to  [place], [place] - [address]

## 2021-11-29 NOTE — Telephone Encounter (Signed)
RX sent to pharmacy as 90 day supply.

## 2021-11-29 NOTE — Progress Notes (Addendum)
Chronic Care Management Pharmacy Assistant   Name: Amber Brewer  MRN: [id number] DOB: 1951-05-10  Reason for Encounter: Medication Review/ Medication Coordination   Conditions to be addressed/monitored: HTN  Recent office visits:   11/06/21 [name], NP - Patient presented for Pre-diabetes and other concerns. Stopped Benzonatate and Bupropion.  09/26/21 [name], NP - Patient presented for Respiratory infection and other concerns. Prescribed Azithromycin 250 mg, Benzonatate 200 mg and Metformin 250 mg.  Recent consult visits:  11/01/21 Jari Pigg - Patient presented for Seborrheic keratoses and other concerns. No medication changes noted.  10/17/21 Hilty, Nadean Corwin, MD (Cardiology) - Patient presented for Essential hypertension and other concerns. Changed Bupropion to 150 mg.  09/04/21 Omitoogun, Oladimeji (PT) - Patient presented for Pain in right hip. No other visit details available.  09/03/21 [place], Heather (Optometry) - Patient presented for PR Refraction and other concerns. No medication changes noted.  Hospital visits:  Medication Reconciliation was completed by comparing discharge summary, patients EMR and Pharmacy list, and upon discussion with patient.  Patient presented to [facility] on  10/09/21 due to Asymptomatic hypertension.    New?Medications Started at [facility] Discharge:?? -started  none  Medication Changes at Hospital Discharge: -Changed  amLODIPine 10 MG tablet  Medications Discontinued at Hospital Discharge: -Stopped  none  Medications that remain the same after Hospital Discharge:??  -All other medications will remain the same.     Hospital visits:  Medication Reconciliation was completed by comparing discharge summary, patients EMR and Pharmacy list, and upon discussion with patient.  Patient presented to [facility] on  10/08/21 due to Essential hypertension.     New?Medications Started at [facility] Discharge:?? -started  Amlodipine 5 mg  Medication Changes at Hospital Discharge: -Changed  none  Medications Discontinued at Hospital Discharge: -Stopped  none  Medications that remain the same after Hospital Discharge:??  -All other medications will remain the same.     Medications: Outpatient Encounter Medications as of 11/29/2021  Medication Sig   albuterol (PROAIR HFA) 108 (90 Base) MCG/ACT inhaler Inhale 1-2 puffs into the lungs every 4 (four) hours as needed.   amLODipine (NORVASC) 10 MG tablet Take 1 tablet (10 mg total) by mouth daily.   budesonide (RHINOCORT AQUA) 32 MCG/ACT nasal spray Place 1 spray into both nostrils daily as needed for allergies.    carvedilol (COREG) 12.5 MG tablet TAKE ONE TABLET BY MOUTH EVERY MORNING and TAKE ONE TABLET BY MOUTH EVERY EVENING   Cholecalciferol (VITAMIN D) 50 MCG (2000 UT) tablet Take 2,000 Units by mouth daily.    citalopram (CELEXA) 10 MG tablet TAKE 1/2 TABLET BY MOUTH DAILY   Continuous Blood Gluc Sensor (FREESTYLE LIBRE 3 SENSOR) MISC 1 Device by Does not apply route every 14 (fourteen) days. Place 1 sensor on the skin every 14 days. Use to check glucose continuously   cycloSPORINE (RESTASIS) 0.05 % ophthalmic emulsion Place 1 drop into both eyes 2 (two) times daily.   DOTTI 0.05 MG/24HR patch PLACE ONE PATCH onto THE SKIN TWICE WEEKLY   famotidine (PEPCID) 20 MG tablet TAKE ONE TABLET BY MOUTH EVERY EVENING   hydrochlorothiazide (HYDRODIURIL) 25 MG tablet TAKE ONE TABLET BY MOUTH EVERY MORNING   irbesartan (AVAPRO) 150 MG tablet Take 1 tablet (150 mg total) by mouth 2 (two) times daily.   levothyroxine (SYNTHROID) 50 MCG tablet TAKE ONE TABLET BY MOUTH BEFORE BREAKFAST   metFORMIN (GLUCOPHAGE) 500 MG tablet Take 0.5  tablets (250 mg total) by mouth daily with breakfast.   Multiple Vitamins-Minerals (ALIVE WOMENS GUMMY PO) Take 1 tablet by mouth daily.   multivitamin-lutein (OCUVITE-LUTEIN)  CAPS capsule Take 1 capsule by mouth daily.   potassium chloride (KLOR-CON) 10 MEQ tablet TAKE ONE TABLET BY MOUTH EVERY EVENING   rosuvastatin (CRESTOR) 10 MG tablet TAKE ONE TABLET BY MOUTH every other evening   TRELEGY ELLIPTA 200-62.5-25 MCG/INH AEPB INHALE 1 PUFF BY MOUTH INTO LUNGS DAILY   triamcinolone (NASACORT) 55 MCG/ACT AERO nasal inhaler INSTILL 1 SPRAY ONCE DAILY   TURMERIC PO Take 500 mg by mouth daily.    Zinc 100 MG TABS Take 1 tablet by mouth daily.   No facility-administered encounter medications on file as of 11/29/2021.  Reviewed chart prior to disease state call. Spoke with patient regarding BP  Recent Office Vitals: BP Readings from Last 3 Encounters:  11/06/21 120/70  10/17/21 127/72  09/26/21 (!) 160/90   Pulse Readings from Last 3 Encounters:  11/06/21 (!) 59  10/17/21 63  09/26/21 69    Wt Readings from Last 3 Encounters:  11/06/21 202 lb (91.6 kg)  10/17/21 200 lb 3.2 oz (90.8 kg)  09/26/21 199 lb 3.2 oz (90.4 kg)     Kidney Function Lab Results  Component Value Date/Time   CREATININE 0.90 05/02/2021 10:12 AM   CREATININE 0.92 03/02/2020 07:32 AM   GFR 64.86 05/02/2021 10:12 AM   GFRNONAA 62 06/03/2018 11:30 AM   GFRAA 72 06/03/2018 11:30 AM    BMP Latest Ref Rng & Units 05/02/2021 03/02/2020 09/24/2019  Glucose 70 - 99 mg/dL 107(H) 121(H) 97  BUN 6 - 23 mg/dL 13 15 16   Creatinine 0.40 - 1.20 mg/dL 0.90 0.92 0.86  BUN/Creat Ratio 12 - 28 - - -  Sodium 135 - 145 mEq/L 141 138 141  Potassium 3.5 - 5.1 mEq/L 4.1 4.1 4.4  Chloride 96 - 112 mEq/L 103 100 104  CO2 19 - 32 mEq/L 27 30 28   Calcium 8.4 - 10.5 mg/dL 9.9 9.7 10.0    Current antihypertensive regimen:  Carvedilol 12.5 mg 1 tablet twice daily with a meal HCTZ 25 mg 1 tablet daily at lunch Irbesartan 150 mg 1 tablet in the morning and at night Amlodipine 10 mg How often are you checking your Blood Pressure? 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 No  Adherence Review: Is the patient currently on ACE/ARB medication? No Does the patient have >5 day gap between last estimated fill dates? No   Reviewed chart for medication changes ahead of medication coordination call.  No OVs, Consults, or hospital visits since last care coordination call/Pharmacist visit.   No medication changes indicated   BP Readings from Last 3 Encounters:  11/06/21 120/70  10/17/21 127/72  09/26/21 (!) 160/90    Lab Results  Component Value Date   HGBA1C 6.3 (A) 11/06/2021     Patient obtains medications through Adherence Packaging  90 Days   Last adherence delivery included: (medication name and frequency) Famotidine (pepcid) 20 mg: one tablet daily at dinner Levothyroxine (synthroid) 50 MCG: one tablet at dinner Citalopram (celexa) 10 mg:  tablet daily at breakfast   Irbesartan (avapro)150 mg: Take 1 tablet by mouth at  breakfast and at dinner Potassium Chloride (Klor-Con) 10 MEQ: one tablet daily at dinner Carvedilol (Coreg)12.5 mg: one tablet twice daily at breakfast and dinner Vitamin D3 2000 units daily: one tablet daily at dinner [place] Adult Multivitamin 50+ 1 capsule daily: one at breakfast Turmeric 500 mg: one tablet daily at dinner Hydrochlorothiazide 25 MG tablet: one tablet at breakfast Rosuvastatin 10 mg: one tablet every other day at dinner [place]: change every 14 days  Patient declined the following medications (meds) due to (reason) Trelegy Ellipta 200-62.5-25: one puff into lung daily  ;on hand supply Estradiol (Vivelle-Dot) 0.05 mg 24 hr patch: one patch onto skin twice a week ; on hand supply Albuterol (PROAIR HFA) 108 (90 Base) MCG/ACT inhaler ; on hand supply Wellbutrin 150 mg :  not using currently   Patient needs refills for   Triamcinolone (Nasacort) 55 mcg ; one spray once daily  Patient is due for next adherence delivery on: 12/11/21. Called patient and reviewed medications and coordinated delivery. Packs for 90 DS  This delivery to include: Famotidine (pepcid) 20 mg: one tablet daily at bedtime Levothyroxine (synthroid) 50 MCG: one tablet at breakfast Citalopram (celexa) 10 mg:  tablet daily at breakfast   Irbesartan (avapro)150 mg: Take 1 tablet by mouth at breakfast and at dinner Potassium Chloride (Klor-Con) 10 MEQ: one tablet daily at dinner Carvedilol (Coreg)12.5 mg: one tablet twice daily at breakfast and dinner Vitamin D3 2000 units daily: one tablet daily at dinner [place] Adult Multivitamin 50+ 1 capsule daily: one at breakfast Turmeric 500 mg: one tablet daily at dinner Hydrochlorothiazide 25 MG tablet: one tablet at breakfast Rosuvastatin 10 mg: one tablet every other day at dinner Freestyle Libre Sensors: change every 14 days   Added Amlodipine (Norvasc) 5 mg:Take one tablet at  breakfast (Contacted prescribing [facility] to [facility] script [facility] per patient he said ok to take 5 mg not 10 due to swelling)  Added Metformin 500 mg mg 1/2 tablet daily with breakfast (Reached out to [facility] to request prescription)  Added Albuterol (PROAIR HFA) 108 (90 Base) MCG/ACT inhaler (Qty of 1 pt has one that is expired)   Patient declined the following medications (meds) due to (reason) Estradiol (Vivelle-Dot) 0.05 mg 24 hr patch: one patch onto skin twice a week    Confirmed delivery date of 12/11/21, advised patient that pharmacy will contact them the morning of delivery.   Care Gaps: Zoster Vaccine - Overdue COVID Booster - Overdue TDAP - Overdue Flu Vaccine - Overdue BP- 120/70 ( 11/06/21) AWV- 6/22 CCM- 2/23 Lab Results  Component Value Date   HGBA1C 6.3 (A) 11/06/2021    Star Rating Drugs: Ibesartan (Avapro) 150 mg - Last  filled 09/05/21 90 DS at [facility] Rosuvastatin (Crestor) 10 mg - Last filled 09/05/21 89 DS at [facility] Metformin (Glucophage) 250 mg - to be filled with this order [facility]  [place] Pharmacist Assistant [phone]

## 2021-11-30 ENCOUNTER — Telehealth: Payer: Self-pay | Admitting: Internal Medicine

## 2021-11-30 DIAGNOSIS — R7303 Prediabetes: Secondary | ICD-10-CM

## 2021-11-30 MED ORDER — METFORMIN HCL 500 MG PO TABS: 250.0000 mg | ORAL_TABLET | Freq: Every day | ORAL | 1 refills | Status: DC

## 2021-11-30 NOTE — Telephone Encounter (Signed)
[facility] call stating patient told them Dr. Debara Pickett said it was okay for her to take 5mg  of Amlodipine instead of 10mg .  Reason being her legs swell when she takes 10mg . They need a new script of Amlodipine 5mg  if that is correct.

## 2021-11-30 NOTE — Telephone Encounter (Signed)
Left a message for the patient to call back.  

## 2021-11-30 NOTE — Telephone Encounter (Signed)
[facility] sent to [facility] electronically.

## 2021-11-30 NOTE — Telephone Encounter (Signed)
-----   Message from Viona Gilmore, [facility] sent at 11/30/2021 10:54 AM EST ----- Regarding: Metformin refill Hi,  Can you please send a refill of metformin to [facility] to get it lined up with her other medications?  Thank you! [name]

## 2021-12-05 ENCOUNTER — Telehealth: Payer: Self-pay | Admitting: Pharmacist

## 2021-12-05 ENCOUNTER — Encounter: Payer: Self-pay | Admitting: Adult Health

## 2021-12-05 NOTE — Telephone Encounter (Signed)
Pt declined appt. 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 Pt advised to go to UC or med center over the weekend for her off day. Pt verbalized understanding.

## 2021-12-05 NOTE — Chronic Care Management (AMB) (Signed)
Chronic Care Management Pharmacy Assistant   Name: Amber Brewer  MRN: [id number] DOB: 1951-02-08  Reason for Encounter: Reschedule Follow up with MP per Patient    Recent office visits:  none  Recent consult visits:  none  Hospital visits:  Medication Reconciliation was completed by comparing discharge summary, patients EMR and Pharmacy list, and upon discussion with patient.   Patient presented to [facility] on  10/09/21 due to Asymptomatic hypertension.     New?Medications Started at [facility] Discharge:?? -started  none   Medication Changes at Hospital Discharge: -Changed  amLODIPine 10 MG tablet   Medications Discontinued at Hospital Discharge: -Stopped  none   Medications that remain the same after Hospital Discharge:??  -All other medications will remain the same.       Hospital visits:  Medication Reconciliation was completed by comparing discharge summary, patients EMR and Pharmacy list, and upon discussion with patient.   Patient presented to [facility] on  10/08/21 due to Essential hypertension.     New?Medications Started at [facility] Discharge:?? -started  Amlodipine 5 mg   Medication Changes at Hospital Discharge: -Changed  none   Medications Discontinued at Hospital Discharge: -Stopped  none   Medications that remain the same after Hospital Discharge:??  -All other medications will remain the same.        Medications: Outpatient Encounter Medications as of 12/05/2021  Medication Sig   albuterol (PROAIR HFA) 108 (90 Base) MCG/ACT inhaler Inhale 1-2 puffs into the lungs every 4 (four) hours as needed.   amLODipine (NORVASC) 10 MG tablet Take 1 tablet (10 mg total) by mouth daily.   budesonide (RHINOCORT AQUA) 32 MCG/ACT nasal spray Place 1 spray into both nostrils daily as needed for allergies.    carvedilol (COREG) 12.5 MG tablet TAKE ONE TABLET BY MOUTH EVERY MORNING and  TAKE ONE TABLET BY MOUTH EVERY EVENING   Cholecalciferol (VITAMIN D) 50 MCG (2000 UT) tablet Take 2,000 Units by mouth daily.    citalopram (CELEXA) 10 MG tablet TAKE 1/2 TABLET BY MOUTH DAILY   Continuous Blood Gluc Sensor (FREESTYLE LIBRE 3 SENSOR) MISC 1 Device by Does not apply route every 14 (fourteen) days. Place 1 sensor on the skin every 14 days. Use to check glucose continuously   cycloSPORINE (RESTASIS) 0.05 % ophthalmic emulsion Place 1 drop into both eyes 2 (two) times daily.   DOTTI 0.05 MG/24HR patch PLACE ONE PATCH onto THE SKIN TWICE WEEKLY   famotidine (PEPCID) 20 MG tablet TAKE ONE TABLET BY MOUTH EVERY EVENING   hydrochlorothiazide (HYDRODIURIL) 25 MG tablet TAKE ONE TABLET BY MOUTH EVERY MORNING   irbesartan (AVAPRO) 150 MG tablet Take 1 tablet (150 mg total) by mouth 2 (two) times daily.   levothyroxine (SYNTHROID) 50 MCG tablet TAKE ONE TABLET BY MOUTH BEFORE BREAKFAST   metFORMIN (GLUCOPHAGE) 500 MG tablet Take 0.5 tablets (250 mg total) by mouth daily with breakfast.   Multiple Vitamins-Minerals (ALIVE WOMENS GUMMY PO) Take 1 tablet by mouth daily.   multivitamin-lutein (OCUVITE-LUTEIN) CAPS capsule Take 1 capsule by mouth daily.   potassium chloride (KLOR-CON) 10 MEQ tablet TAKE ONE TABLET BY MOUTH EVERY EVENING   rosuvastatin (CRESTOR) 10 MG tablet TAKE ONE TABLET BY MOUTH every other evening   TRELEGY ELLIPTA 200-62.5-25 MCG/INH AEPB INHALE 1 PUFF BY MOUTH INTO LUNGS DAILY   triamcinolone (NASACORT) 55 MCG/ACT AERO nasal inhaler INSTILL 1 SPRAY ONCE DAILY   TURMERIC PO Take 500  mg by mouth daily.    Zinc 100 MG TABS Take 1 tablet by mouth daily.   No facility-administered encounter medications on file as of 12/05/2021.  Notes: Patient reports her schedule will allow her to make upcoming appt with MP, advised during the week early or mid is best for her, rescheduled for 4/3 pt in agreement.   Care Gaps: Zoster Vaccine - Overdue COVID Booster - Overdue TDAP -  Overdue Flu Vaccine - Overdue BP- 120/70 ( 11/06/21) AWV- 6/22 CCM- 4/23  Star Rating Drugs: Ibesartan (Avapro) 150 mg - Last filled 09/05/21 90 DS at [facility] Rosuvastatin (Crestor) 10 mg - Last filled 09/05/21 89 DS at [facility] Metformin (Glucophage) 250 mg - Last filled 09/26/21 90 DS at [facility] (for 500 mg pt taking 1/2)  [place] Clinical Pharmacist Assistant [phone]

## 2021-12-07 ENCOUNTER — Telehealth: Payer: PPO

## 2021-12-25 ENCOUNTER — Other Ambulatory Visit: Payer: Self-pay

## 2021-12-25 ENCOUNTER — Encounter: Payer: Self-pay | Admitting: Allergy and Immunology

## 2021-12-25 ENCOUNTER — Telehealth: Payer: Self-pay | Admitting: Internal Medicine

## 2021-12-25 ENCOUNTER — Ambulatory Visit: Payer: PPO | Admitting: Allergy and Immunology

## 2021-12-25 VITALS — BP 122/70 | HR 61 | Temp 97.2°F | Resp 16 | Ht 64.0 in | Wt 197.4 lb

## 2021-12-25 DIAGNOSIS — J4540 Moderate persistent asthma, uncomplicated: Secondary | ICD-10-CM

## 2021-12-25 DIAGNOSIS — J3089 Other allergic rhinitis: Secondary | ICD-10-CM

## 2021-12-25 DIAGNOSIS — K219 Gastro-esophageal reflux disease without esophagitis: Secondary | ICD-10-CM

## 2021-12-25 MED ORDER — FLUTICASONE-UMECLIDIN-VILANT 200-62.5-25 MCG/ACT IN AEPB: INHALATION_SPRAY | RESPIRATORY_TRACT | 1 refills | Status: DC

## 2021-12-25 MED ORDER — FAMOTIDINE 20 MG PO TABS: 20.0000 mg | ORAL_TABLET | Freq: Every evening | ORAL | 1 refills | Status: DC

## 2021-12-25 NOTE — Progress Notes (Signed)
? ?[facility] ? ? ?Follow-up Note ? ?Referring Provider: [name], NP ?Primary Provider: [name], NP ?Date of Office Visit: 12/25/2021 ? ?Subjective:  ? ?Amber Brewer (DOB: May 20, 1951) is a 71 y.o. female who returns to the [facility] on 12/25/2021 in re-evaluation of the following: ? ?HPI: Amber Brewer returns to this clinic in evaluation of asthma, allergic rhinitis, and LPR.  Her last visit to this clinic was 19 December 2020. ? ?She has done relatively well with her asthma and has not required a systemic steroid to treat an exacerbation.  Apparently she did develop an episode of coughing sometime at the beginning of this year that was treated with a Z-Pak and Tessalon Perles but that issue has completely resolved.  Prior to that issue she rarely uses short acting bronchodilator and rarely had any respiratory tract symptoms and could exert herself without any difficulty.  She continues on a triple inhaler ? ?She has not really had any problems with her nose.  She continues on a nasal steroid ? ?Her reflux is under very good control on her current H2 receptor blocker use. ? ?Allergies as of 12/25/2021   ? ?   Reactions  ? Codeine Phosphate Nausea And Vomiting  ? REACTION: unspecified  ? Prednisone Shortness Of Breath  ? Betadine [povidone Iodine] Hives  ? After surgical skin prep  ? Sulfa Antibiotics Nausea And Vomiting  ? Iodine Rash  ? Broke out on skin once years ago--**Betadine Skin Prep**  ? ?  ? ?  ?Medication List  ? ? ?albuterol 108 (90 Base) MCG/ACT inhaler ?Commonly known as: ProAir HFA ?Inhale 1-2 puffs into the lungs every 4 (four) hours as needed. ?  ?amLODipine 10 MG tablet ?Commonly known as: NORVASC ?Take 1 tablet (10 mg total) by mouth daily. ?  ?budesonide 32 MCG/ACT nasal spray ?Commonly known as: RHINOCORT AQUA ?Place 1 spray into both nostrils daily as needed for allergies. ?  ?carvedilol 12.5 MG tablet ?Commonly known as:  COREG ?TAKE ONE TABLET BY MOUTH EVERY MORNING and TAKE ONE TABLET BY MOUTH EVERY EVENING ?  ?citalopram 10 MG tablet ?Commonly known as: CELEXA ?TAKE 1/2 TABLET BY MOUTH DAILY ?  ?cycloSPORINE 0.05 % ophthalmic emulsion ?Commonly known as: RESTASIS ?Place 1 drop into both eyes 2 (two) times daily. ?  ?Dotti 0.05 MG/24HR patch ?Generic drug: estradiol ?PLACE ONE PATCH onto THE SKIN TWICE WEEKLY ?  ?famotidine 20 MG tablet ?Commonly known as: PEPCID ?Take 1 tablet (20 mg total) by mouth every evening. ?  ?Fluticasone-Umeclidin-Vilant 200-62.5-25 MCG/ACT Aepb ?Commonly known as: Trelegy Ellipta ?INHALE 1 PUFF BY MOUTH INTO LUNGS DAILY ?What changed: medication strength ?Changed by: Jiles Prows, MD ?  ?FreeStyle Libre 3 Sensor Misc ?1 Device by Does not apply route every 14 (fourteen) days. Place 1 sensor on the skin every 14 days. Use to check glucose continuously ?  ?hydrochlorothiazide 25 MG tablet ?Commonly known as: HYDRODIURIL ?TAKE ONE TABLET BY MOUTH EVERY MORNING ?  ?irbesartan 150 MG tablet ?Commonly known as: AVAPRO ?Take 1 tablet (150 mg total) by mouth 2 (two) times daily. ?  ?levothyroxine 50 MCG tablet ?Commonly known as: Synthroid ?TAKE ONE TABLET BY MOUTH BEFORE BREAKFAST ?  ?metFORMIN 500 MG tablet ?Commonly known as: GLUCOPHAGE ?Take 0.5 tablets (250 mg total) by mouth daily with breakfast. ?  ?multivitamin-lutein Caps capsule ?Take 1 capsule by mouth daily. ?  ?ALIVE WOMENS GUMMY PO ?Take 1 tablet  by mouth daily. ?  ?potassium chloride 10 MEQ tablet ?Commonly known as: KLOR-CON ?TAKE ONE TABLET BY MOUTH EVERY EVENING ?  ?rosuvastatin 10 MG tablet ?Commonly known as: CRESTOR ?TAKE ONE TABLET BY MOUTH every other evening ?  ?triamcinolone 55 MCG/ACT Aero nasal inhaler ?Commonly known as: NASACORT ?INSTILL 1 SPRAY ONCE DAILY ?  ?TURMERIC PO ?Take 500 mg by mouth daily. ?  ?Vitamin D 50 MCG (2000 UT) tablet ?Take 2,000 Units by mouth daily. ?  ?Zinc 100 MG Tabs ?Take 1 tablet by mouth daily. ?  ? ?Past  Medical History:  ?Diagnosis Date  ? Asthma   ? Chicken pox   ? DDD (degenerative disc disease), cervical   ? Depression   ? Gallstones   ? GERD (gastroesophageal reflux disease)   ? Hx of melanoma in situ 2002  ? Hx: UTI (urinary tract infection)   ? Hyperlipidemia   ? Hypertension   ? Hypothyroidism   ? IBS (irritable bowel syndrome)   ? Melanoma ([facility])   ? x2   ? Nephrolithiasis   ? Osteoarthritis   ? PVC's (premature ventricular contractions)   ? Status post dilation of esophageal narrowing   ? ? ?Past Surgical History:  ?Procedure Laterality Date  ? ABDOMINAL HYSTERECTOMY  1974  ? APPENDECTOMY  1977  ? BLADDER SURGERY  1974  ? BREAST EXCISIONAL BIOPSY    ? CATARACT EXTRACTION Bilateral 2015  ? CHOLECYSTECTOMY  1987  ? removal of melanoma in situ    ? ? ?Review of systems negative except as noted in HPI / PMHx or noted below: ? ?Review of Systems  ?Constitutional: Negative.   ?HENT: Negative.    ?Eyes: Negative.   ?Respiratory: Negative.    ?Cardiovascular: Negative.   ?Gastrointestinal: Negative.   ?Genitourinary: Negative.   ?Musculoskeletal: Negative.   ?Skin: Negative.   ?Neurological: Negative.   ?Endo/Heme/Allergies: Negative.   ?Psychiatric/Behavioral: Negative.    ? ? ?Objective:  ? ?Vitals:  ? 12/25/21 1042  ?BP: 122/70  ?Pulse: 61  ?Resp: 16  ?Temp: (!) 97.2 ?F (36.2 ?C)  ?SpO2: 97%  ? ?Height: '5\' 4"'$  (162.6 cm)  ?Weight: 197 lb 6 oz (89.5 kg)  ? ?Physical Exam ?Constitutional:   ?   Appearance: She is not diaphoretic.  ?HENT:  ?   Head: Normocephalic.  ?   Right Ear: Tympanic membrane, ear canal and external ear normal.  ?   Left Ear: Tympanic membrane, ear canal and external ear normal.  ?   Nose: Nose normal. No mucosal edema or rhinorrhea.  ?   Mouth/Throat:  ?   Pharynx: Uvula midline. No oropharyngeal exudate.  ?Eyes:  ?   Conjunctiva/sclera: Conjunctivae normal.  ?Neck:  ?   Thyroid: No thyromegaly.  ?   Trachea: Trachea normal. No tracheal tenderness or tracheal deviation.  ?Cardiovascular:  ?    Rate and Rhythm: Normal rate and regular rhythm.  ?   Heart sounds: Normal heart sounds, S1 normal and S2 normal. No murmur heard. ?Pulmonary:  ?   Effort: No respiratory distress.  ?   Breath sounds: Normal breath sounds. No stridor. No wheezing or rales.  ?Lymphadenopathy:  ?   Head:  ?   Right side of head: No tonsillar adenopathy.  ?   Left side of head: No tonsillar adenopathy.  ?   Cervical: No cervical adenopathy.  ?Skin: ?   Findings: No erythema or rash.  ?   Nails: There is no clubbing.  ?Neurological:  ?  Mental Status: She is alert.  ? ? ?Diagnostics:  ?  ?Spirometry was performed and demonstrated an FEV1 of 1.24 at 57 % of predicted. ? ?Assessment and Plan:  ? ?1. Asthma, moderate persistent, well-controlled   ?2. Other allergic rhinitis   ?3. LPRD (laryngopharyngeal reflux disease)   ? ?  ?1. Continue to Treat inflammation: ? ? A. Trelegy 200 - one inhalation daily  ? B. Nasacort 1 spray each nostril 3-7 times a week ? ?2. Continue to Treat reflux: ?  ? A. Famotidine 20 mg - one time per day ? ?3. If needed: ? ? A. nasal saline wash ? B. ProAir HFA 2 puffs every 4-6 hours ? C. OTC antihistamine - Claritin/Allegra/Zyrtec ? D. OTC Mucinex DM 2 tablets twice a day ? ?4. Return to clinic in 12 months or earlier if problem ? ?Overall Meribeth has done relatively well with her airway issue and her reflux issue on her current plan of anti-inflammatory medications for both her upper and lower airway and the use of an H2 receptor blocker.  We will continue her on this plan and see her back in this clinic in 1 year or earlier if there is a problem. ? ?Allena Katz, MD ?Allergy / Immunology ?[facility] ?

## 2021-12-25 NOTE — Telephone Encounter (Signed)
Amlodipine should not cause palpitations - timing may be coincidence. Vitals looks good. Would continue until we discuss in the office. ? ?Dr Lemmie Evens ?

## 2021-12-25 NOTE — Telephone Encounter (Signed)
Spoke with patient of Dr. Debara Pickett who reports palpitations since the addition of amlodipine, about 2 months ago. She reports palpitations are "not real bad now" but they are more noticeable. The palpitations are not correlated with any acute concerns - denies chest pain, shortness of breath, fatigue. They occur most often with rest. She palpitations are short-lived.  ? ?She monitors BP and pulse twice daily ?Reports BP averages 126/80s - sometimes in AM higher, but comes down with meds ?Reports HR averages 60-62bpm ?She does not have a log of specific readings ? ?She has h/o symptomatic PVCs ? ?Reviewed meds and she takes amlodipine, irbesartan, hctz, carvedilol  ? ?Scheduled for routine visit on 3/24 with Dr. Debara Pickett  ?She is out of town until 3/20 ? ?Message routed to MD to review and notified patient will call with any advice prior to visit ? ? ? ?

## 2021-12-25 NOTE — Telephone Encounter (Signed)
Patient c/o Palpitations:  High priority if patient c/o lightheadedness, shortness of breath, or chest pain ? ?How long have you had palpitations/irregular HR/ Afib? About four weeks ? ?Are you having the symptoms now? no ? ?Are you currently experiencing lightheadedness, SOB or CP? no ? ?Do you have a history of afib (atrial fibrillation) or irregular heart rhythm? yes ? ?Have you checked your BP or HR? (document readings if available): patient has log at home . She is in the car  ? ?Are you experiencing any other symptoms? Not now. Last week she had some tingling  and when she checked her BP then it was a little high. ?Patient is out of town and does not have access to her list of BP readings ? ? ?

## 2021-12-25 NOTE — Telephone Encounter (Signed)
Update sent to patient via MyChart message ?

## 2021-12-25 NOTE — Patient Instructions (Addendum)
?  ?  1. Continue to Treat inflammation: ? ? A. Trelegy 200 - one inhalation daily  ? B. Nasacort 1 spray each nostril 3-7 times a week ? ?2. Continue to Treat reflux: ? ? A. Famotidine 20 mg - one time per day ? ?3. If needed: ? ? A. nasal saline wash ? B. ProAir HFA 2 puffs every 4-6 hours ? C. OTC antihistamine - Claritin/Allegra/Zyrtec ? D. OTC Mucinex DM 2 tablets twice a day ? ?4. Return to clinic in 12 months or earlier if problem ? ? ? ?

## 2021-12-26 ENCOUNTER — Encounter: Payer: Self-pay | Admitting: Allergy and Immunology

## 2021-12-27 ENCOUNTER — Telehealth: Payer: Self-pay | Admitting: Internal Medicine

## 2021-12-27 ENCOUNTER — Other Ambulatory Visit: Payer: Self-pay

## 2021-12-27 MED ORDER — AMLODIPINE BESYLATE 5 MG PO TABS: 5.0000 mg | ORAL_TABLET | Freq: Every day | ORAL | 2 refills | Status: DC

## 2021-12-27 NOTE — Telephone Encounter (Signed)
Pt c/o medication issue: ? ?1. Name of Medication: amLODipine (NORVASC) 10 MG tablet ? ?2. How are you currently taking this medication (dosage and times per day)? Patient reports taking 5 mg daily  ? ?3. Are you having a reaction (difficulty breathing--STAT)?  ? ?4. What is your medication issue? Pharmacy would like clarification about what dose the patient needs to take. ? ?If the patient needs an updated rx please [facility] to [facility] ?

## 2021-12-27 NOTE — Progress Notes (Signed)
UPDATING AMLODIPINE to 5 mg- per message from Dr.Hilty below. ?Thanks! ? ? ?Mrs. Gossman- ?  ?That sounds reasonable - glad that your swelling has improved. Elevated blood pressure may have been contributing to it.   ?  ?Dr. Debara Pickett ? ?Last read by [name] at  6:49 PM on 11/28/2021. ?  ? ? 7:57 AM ?November 17, 2021 ?Nyelah Emmerich Ackley ?to P Cv Div Nl Triage (supporting Boring, Nadean Corwin, MD)   ?   4:44 PM ?swelling of legs and feet had gotten much worst. Saw primary doctor on 17th of Jan. for wellness check. I started using amlodipine 5 mg and use compression socks. bp very good and swelling much better. are you ok with trying this for a while? thank you ?

## 2021-12-27 NOTE — Telephone Encounter (Signed)
Sent RX to pharmacy- RX updated to 5 mg. ? ? ?See message below from previous mychart message:  ? ?Mrs. Champeau- ?  ?That sounds reasonable - glad that your swelling has improved. Elevated blood pressure may have been contributing to it.   ?  ?Dr. Debara Pickett ? ?Last read by [name] at  6:49 PM on 11/28/2021. ?  ? ?November 17, 2021 ?Yessenia Maillet Viles ?to P Cv Div Nl Triage (supporting [place], Nadean Corwin, MD)   ?  4:44 PM ?swelling of legs and feet had gotten much worst. Saw primary doctor on 17th of Jan. for wellness check. I started using amlodipine 5 mg and use compression socks. bp very good and swelling much better. are you ok with trying this for a while? thank you ?

## 2021-12-28 ENCOUNTER — Telehealth: Payer: Self-pay | Admitting: Pharmacist

## 2021-12-28 NOTE — Chronic Care Management (AMB) (Signed)
? ? ?Chronic Care Management ?Pharmacy Assistant  ? ?Name: Amber Brewer  MRN: [id number] DOB: 01-Dec-1950 ? ?Reason for Encounter: Reschedule follow up per MP ?  ?Recent office visits:  ?None ? ?Recent consult visits:  ?12/25/21 Jiles Prows, MD - Patient presented for Moderate persistent well controlled asthma and other concerns. Changed Fluticasone ? ?Hospital visits:  ?Medication Reconciliation was completed by comparing discharge summary, patient?s EMR and Pharmacy list, and upon discussion with patient. ?  ?Patient presented to [facility] on  10/08/21 due to Essential hypertension.   ?  ?New?Medications Started at [facility] Discharge:?? ?-started  ?Amlodipine 5 mg ?  ?Medication Changes at Hospital Discharge: ?-Changed  ?none ?  ?Medications Discontinued at Hospital Discharge: ?-Stopped  ?none ?  ?Medications that remain the same after Hospital Discharge:??  ?-All other medications will remain the same.   ?  ? ?Medications: ?Outpatient Encounter Medications as of 12/28/2021  ?Medication Sig  ? albuterol (PROAIR HFA) 108 (90 Base) MCG/ACT inhaler Inhale 1-2 puffs into the lungs every 4 (four) hours as needed.  ? amLODipine (NORVASC) 5 MG tablet Take 1 tablet (5 mg total) by mouth daily.  ? budesonide (RHINOCORT AQUA) 32 MCG/ACT nasal spray Place 1 spray into both nostrils daily as needed for allergies.   ? carvedilol (COREG) 12.5 MG tablet TAKE ONE TABLET BY MOUTH EVERY MORNING and TAKE ONE TABLET BY MOUTH EVERY EVENING  ? Cholecalciferol (VITAMIN D) 50 MCG (2000 UT) tablet Take 2,000 Units by mouth daily.   ? citalopram (CELEXA) 10 MG tablet TAKE 1/2 TABLET BY MOUTH DAILY  ? Continuous Blood Gluc Sensor (FREESTYLE LIBRE 3 SENSOR) MISC 1 Device by Does not apply route every 14 (fourteen) days. Place 1 sensor on the skin every 14 days. Use to check glucose continuously  ? cycloSPORINE (RESTASIS) 0.05 % ophthalmic emulsion Place 1 drop into both eyes 2 (two) times daily.  ? DOTTI  0.05 MG/24HR patch PLACE ONE PATCH onto THE SKIN TWICE WEEKLY  ? famotidine (PEPCID) 20 MG tablet Take 1 tablet (20 mg total) by mouth every evening.  ? Fluticasone-Umeclidin-Vilant (TRELEGY ELLIPTA) 200-62.5-25 MCG/ACT AEPB INHALE 1 PUFF BY MOUTH INTO LUNGS DAILY  ? hydrochlorothiazide (HYDRODIURIL) 25 MG tablet TAKE ONE TABLET BY MOUTH EVERY MORNING  ? irbesartan (AVAPRO) 150 MG tablet Take 1 tablet (150 mg total) by mouth 2 (two) times daily.  ? levothyroxine (SYNTHROID) 50 MCG tablet TAKE ONE TABLET BY MOUTH BEFORE BREAKFAST  ? metFORMIN (GLUCOPHAGE) 500 MG tablet Take 0.5 tablets (250 mg total) by mouth daily with breakfast.  ? Multiple Vitamins-Minerals (ALIVE WOMENS GUMMY PO) Take 1 tablet by mouth daily.  ? multivitamin-lutein (OCUVITE-LUTEIN) CAPS capsule Take 1 capsule by mouth daily.  ? potassium chloride (KLOR-CON) 10 MEQ tablet TAKE ONE TABLET BY MOUTH EVERY EVENING  ? rosuvastatin (CRESTOR) 10 MG tablet TAKE ONE TABLET BY MOUTH every other evening  ? triamcinolone (NASACORT) 55 MCG/ACT AERO nasal inhaler INSTILL 1 SPRAY ONCE DAILY  ? TURMERIC PO Take 500 mg by mouth daily.   ? Zinc 100 MG TABS Take 1 tablet by mouth daily.  ? ?No facility-administered encounter medications on file as of 12/28/2021.  ?Notes: ?Per MP call to patient to push back follow up . Patient in agreement and aware. ? ?Care Gaps: ?Zoster Vaccine - Overdue ?COVID Booster - Overdue ?TDAP - Overdue ?Flu Vaccine - Overdue ?BP- 120/70 ( 11/06/21) ?AWV- 6/22 ?CCM- 7/23 ? ?Star Rating Drugs: ?Ibesartan (Avapro) 150 mg -  Last filled 12/05/21 90 DS at [facility] ?Rosuvastatin (Crestor) 10 mg - Last filled 12/05/21 90 DS at [facility] ?Metformin (Glucophage) 250 mg - Last filled 12/05/21 90 DS at [facility] (for 500 mg pt taking 1/2) ? ? ?Ned Clines CMA ?Clinical Pharmacist Assistant ?[phone] ? ?

## 2022-01-08 DIAGNOSIS — L820 Inflamed seborrheic keratosis: Secondary | ICD-10-CM | POA: Diagnosis not present

## 2022-01-08 DIAGNOSIS — L918 Other hypertrophic disorders of the skin: Secondary | ICD-10-CM | POA: Diagnosis not present

## 2022-01-08 DIAGNOSIS — L821 Other seborrheic keratosis: Secondary | ICD-10-CM | POA: Diagnosis not present

## 2022-01-09 ENCOUNTER — Encounter: Payer: Self-pay | Admitting: Adult Health

## 2022-01-09 DIAGNOSIS — Z1211 Encounter for screening for malignant neoplasm of colon: Secondary | ICD-10-CM

## 2022-01-11 ENCOUNTER — Ambulatory Visit: Payer: PPO | Admitting: Internal Medicine

## 2022-01-16 ENCOUNTER — Telehealth: Payer: Self-pay | Admitting: Pharmacist

## 2022-01-16 NOTE — Telephone Encounter (Signed)
Called patient to go over new Freestyle device with changes from the [employer] 2 vs 3. Left a voicemail and requested a call back. Will call the patient later this week if I do not hear back. ?

## 2022-01-17 DIAGNOSIS — Z1211 Encounter for screening for malignant neoplasm of colon: Secondary | ICD-10-CM | POA: Diagnosis not present

## 2022-01-21 ENCOUNTER — Telehealth: Payer: PPO

## 2022-01-21 DIAGNOSIS — H16142 Punctate keratitis, left eye: Secondary | ICD-10-CM | POA: Diagnosis not present

## 2022-01-21 NOTE — Telephone Encounter (Signed)
Called patient again. Patient reports she is using the [employer] 2 still as she still has 1 more sensor and then will try out the 3. She has not downloaded the app yet either but she does have a smart phone. Recommended looking for the app to see if her phone is compatible and calling me back if she needs any assistance with downloading or use. ?

## 2022-01-23 DIAGNOSIS — H04123 Dry eye syndrome of bilateral lacrimal glands: Secondary | ICD-10-CM | POA: Diagnosis not present

## 2022-01-25 LAB — COLOGUARD: COLOGUARD: POSITIVE — AB

## 2022-01-29 NOTE — Addendum Note (Signed)
Addended by: Elza Rafter D on: 01/29/2022 01:44 PM ? ? Modules accepted: Orders ? ?

## 2022-01-29 NOTE — Telephone Encounter (Signed)
Discussed positive result with pt. Pt made aware that urgent referral will be placed. Pt states she sees Dr Henrene Pastor at [facility] requests referral goes to them.  ?

## 2022-02-04 ENCOUNTER — Encounter: Payer: Self-pay | Admitting: Internal Medicine

## 2022-02-04 ENCOUNTER — Telehealth: Payer: Self-pay | Admitting: Adult Health

## 2022-02-04 NOTE — Telephone Encounter (Signed)
Pt called to provide Amber Brewer with some info of some medication that she stated her and [name] were discussing about. She asked if she can get a call back to discuss.  ? ?Please advise.  ?

## 2022-02-04 NOTE — Progress Notes (Signed)
Call to patient she advised that she did get the freestyle [place] with [facility] but is only compatible with I phone. She however has an android phone that it is not compatible with and would prefer the original. Advised her that I would ask if it could be requested prior to [place] returning from vacation as she has one sensor left. Pharmacist advised ? ?Ned Clines CMA ?Clinical Pharmacist Assistant ?[phone] ? ?

## 2022-02-05 ENCOUNTER — Other Ambulatory Visit: Payer: Self-pay | Admitting: Adult Health

## 2022-02-05 DIAGNOSIS — Z1231 Encounter for screening mammogram for malignant neoplasm of breast: Secondary | ICD-10-CM

## 2022-02-05 NOTE — Telephone Encounter (Signed)
Patient currently has the original [employer] -14 day. If she upgrades to [facility] 2, there is a greater chance it will be compatible with her android phone. Patient can download the [employer] 2 app (it is a different app) and try to check compatibility with her phone. We can also just send her the reader device that comes with Freestyle Libre 2.  ? ?Attempted to contact patient to discuss, no answer. ? ?Amber Brewer - please clarify. Does she want Freestyle Libre 2 (w/ reader device) or the original 14-day device (that she already has)? ?

## 2022-02-07 ENCOUNTER — Other Ambulatory Visit: Payer: Self-pay | Admitting: Adult Health

## 2022-02-07 MED ORDER — FREESTYLE LIBRE 2 SENSOR MISC: 1.0000 | 11 refills | Status: DC

## 2022-02-07 NOTE — Progress Notes (Signed)
Patient returned my call says she currently has the [employer] 3 that is not compatible with her phone and would like the original 14 day that she had prior to getting the 3. ? ? ?Ned Clines CMA ?Clinical Pharmacist Assistant ?[phone] ? ?

## 2022-02-23 ENCOUNTER — Other Ambulatory Visit: Payer: Self-pay | Admitting: Adult Health

## 2022-02-23 DIAGNOSIS — F411 Generalized anxiety disorder: Secondary | ICD-10-CM

## 2022-02-26 ENCOUNTER — Telehealth: Payer: Self-pay | Admitting: Internal Medicine

## 2022-02-26 ENCOUNTER — Other Ambulatory Visit: Payer: Self-pay | Admitting: Allergy and Immunology

## 2022-02-26 ENCOUNTER — Telehealth: Payer: Self-pay | Admitting: Pharmacist

## 2022-02-26 ENCOUNTER — Other Ambulatory Visit: Payer: Self-pay | Admitting: Internal Medicine

## 2022-02-26 NOTE — Telephone Encounter (Signed)
New Messagr: ? ? ? ?Pharmacist said patient wants suggestion for swelling in her legs and feet. She said patient is already elevating her feet and legs, wearing support hose, watching the amount of salt intake she is using. She said please call the patient with the suggestions. ?

## 2022-02-26 NOTE — Chronic Care Management (AMB) (Addendum)
? ? ?Chronic Care Management ?Pharmacy Assistant  ? ?Name: Amber Brewer  MRN: [id number] DOB: [birth year] ? ?Reason for Encounter: Medication Review Medication Coordination ?  ?Recent office visits:  ?None ? ?Recent consult visits:  ?None ? ?Hospital visits:  ?Medication Reconciliation was completed by comparing discharge summary, patient?s EMR and Pharmacy list, and upon discussion with patient. ?  ?Patient presented to [facility] on  10/08/21 due to Essential hypertension.   ?  ?New?Medications Started at [facility] Discharge:?? ?-started  ?Amlodipine 5 mg ?  ?Medication Changes at Hospital Discharge: ?-Changed  ?none ?  ?Medications Discontinued at Hospital Discharge: ?-Stopped  ?none ?  ?Medications that remain the same after Hospital Discharge:??  ?-All other medications will remain the same.   ?  ? ?Medications: ?Outpatient Encounter Medications as of 02/26/2022  ?Medication Sig  ? albuterol (VENTOLIN HFA) 108 (90 Base) MCG/ACT inhaler INHALE 1-2 PUFFS into lings EVERY 4 HOURS AS NEEDED  ? amLODipine (NORVASC) 5 MG tablet Take 1 tablet (5 mg total) by mouth daily.  ? budesonide (RHINOCORT AQUA) 32 MCG/ACT nasal spray Place 1 spray into both nostrils daily as needed for allergies.   ? carvedilol (COREG) 12.5 MG tablet TAKE ONE TABLET BY MOUTH EVERY MORNING and TAKE ONE TABLET BY MOUTH EVERY EVENING  ? Cholecalciferol (VITAMIN D) 50 MCG (2000 UT) tablet Take 2,000 Units by mouth daily.   ? citalopram (CELEXA) 10 MG tablet TAKE 1/2 TABLET BY MOUTH EVERY MORNING  ? Continuous Blood Gluc Sensor (FREESTYLE LIBRE 2 SENSOR) MISC 1 Device by Does not apply route every 14 (fourteen) days.  ? cycloSPORINE (RESTASIS) 0.05 % ophthalmic emulsion Place 1 drop into both eyes 2 (two) times daily.  ? DOTTI 0.05 MG/24HR patch PLACE ONE PATCH onto THE SKIN TWICE WEEKLY  ? famotidine (PEPCID) 20 MG tablet Take 1 tablet (20 mg total) by mouth every evening.  ? Fluticasone-Umeclidin-Vilant (TRELEGY  ELLIPTA) 200-62.5-25 MCG/ACT AEPB INHALE 1 PUFF BY MOUTH INTO LUNGS DAILY  ? hydrochlorothiazide (HYDRODIURIL) 25 MG tablet TAKE ONE TABLET BY MOUTH EVERY MORNING  ? irbesartan (AVAPRO) 150 MG tablet Take 1 tablet (150 mg total) by mouth 2 (two) times daily.  ? levothyroxine (SYNTHROID) 50 MCG tablet TAKE ONE TABLET BY MOUTH EVERY EVENING  ? metFORMIN (GLUCOPHAGE) 500 MG tablet Take 0.5 tablets (250 mg total) by mouth daily with breakfast.  ? Multiple Vitamins-Minerals (ALIVE WOMENS GUMMY PO) Take 1 tablet by mouth daily.  ? multivitamin-lutein (OCUVITE-LUTEIN) CAPS capsule Take 1 capsule by mouth daily.  ? potassium chloride (KLOR-CON) 10 MEQ tablet TAKE ONE TABLET BY MOUTH EVERY EVENING  ? rosuvastatin (CRESTOR) 10 MG tablet TAKE ONE TABLET BY MOUTH every other evening  ? triamcinolone (NASACORT) 55 MCG/ACT AERO nasal inhaler INSTILL 1 SPRAY ONCE DAILY  ? TURMERIC PO Take 500 mg by mouth daily.   ? Zinc 100 MG TABS Take 1 tablet by mouth daily.  ? ?No facility-administered encounter medications on file as of 02/26/2022.  ?Reviewed chart for medication changes ahead of medication coordination call. ? ?No OVs, Consults, or hospital visits since last care coordination call/Pharmacist visit. (If appropriate, list visit date, provider name) ? ?No medication changes indicated OR if recent visit, treatment plan here. ? ?BP Readings from Last 3 Encounters:  ?12/25/21 122/70  ?11/06/21 120/70  ?10/17/21 127/72  ?  ?Lab Results  ?Component Value Date  ? HGBA1C 6.3 (A) 11/06/2021  ?  ? ?Patient obtains medications through Adherence Packaging  90 Days  ? ?  Last adherence delivery included: ?Famotidine (pepcid) 20 mg: one tablet daily at bedtime ?Levothyroxine (synthroid) 50 MCG: one tablet at breakfast ?Citalopram (celexa) 10 mg: ? tablet daily at breakfast   ?Irbesartan (avapro)150 mg: Take 1 tablet by mouth at breakfast and at dinner ?Potassium Chloride (Klor-Con) 10 MEQ: one tablet daily at dinner ?Carvedilol (Coreg)12.5 mg:  one tablet twice daily at breakfast and dinner ?Vitamin D3 2000 units daily: one tablet daily at dinner ?Kline Adult Multivitamin 50+ 1 capsule daily: one at breakfast ?Turmeric 500 mg: one tablet daily at dinner ?Hydrochlorothiazide 25 MG tablet: one tablet at breakfast ?Rosuvastatin 10 mg: one tablet every other day at dinner ?Freestyle Libre Sensors: change every 14 days  ?  ?Added Amlodipine (Norvasc) 5 mg:Take one tablet at  breakfast ?(Contacted prescribing [facility] to [facility] per patient he said ok to take 5 mg not 10 due to swelling) ?  ?Added Metformin 500 mg mg 1/2 tablet daily with breakfast ?(Reached out to [facility] to request prescription) ?  ?Added Albuterol (PROAIR HFA) 108 (90 Base) MCG/ACT inhaler ?Otho Darner of 1 pt has one that is expired) ?  ?  ?Patient declined the following medications (meds) due to (reason) ?Estradiol (Vivelle-Dot) 0.05 mg 24 hr patch: one patch onto skin twice a week  ?  ? ?Patient is due for next adherence delivery on: 03/11/22. ?Called patient and reviewed medications and coordinated delivery. ?Packs 90 DS ? ?This delivery to include: ?Amlodipine (Norvasc) 5 mg: take one tablet at  breakfast ?Freestyle Libre 2 Sensors: change every 14 days  ?Citalopram (celexa) 10 mg: ? tablet daily at breakfast   ?Levothyroxine (synthroid) 50 MCG: one tablet at breakfast ?Potassium Chloride (Klor-Con) 10 MEQ: one tablet daily at dinner ?Famotidine (pepcid) 20 mg: one tablet daily at bedtime ?Vitamin D3 2000 units daily: one tablet daily at dinner ?Celeste Adult Multivitamin 50+ 1 capsule daily: one at breakfast ?Turmeric 500 mg: one tablet daily at dinner ?Hydrochlorothiazide 25 MG tablet: one tablet at breakfast ?Rosuvastatin 10 mg: one tablet every other day at dinner ?Irbesartan (avapro)150 mg: Take 1 tablet by mouth at breakfast and at dinner ?Metformin 500 mg mg 1/2 tablet daily with breakfast ?Carvedilol (Coreg) 25 mg: one tablet twice daily at breakfast and  dinner ? ? ?Notes: ?Patient reports she has gone back up to 10 mg for her amlodipine as the 5 mg was not controlling her blood pressure well, she reports she is still also having the swelling even with the use of compression stockings, elevating when she can and trying to monitor and limit her use of salts would like to know what else she can do to help the swelling. ?Call to Dr [facility] office to request an updated prescription for 10 mg be sent over to the pharmacy as well as passed along her questions above to the office for a return call to her. ? ?Refer to telephone encounter - Dr. Debara Pickett did not want to increase amlodipine and increased carvedilol instead to 25 mg BID. ? ?Patient declined the following medications due to abundance on hand ?Estradiol (Vivelle-Dot) 0.05 mg 24 hr patch: one patch onto skin twice a week  ?Trelegy Ellipta: Inhale 1 puff by mouth into lungs daily ( 3 on hand) ?Albuterol (PROAIR HFA) 108 (90 Base) MCG/ACT inhaler ? ? ? ?Confirmed delivery date of 03/11/22, advised patient that pharmacy will contact them the morning of delivery.  ? ?Care Gaps: ?Zoster Vaccine - Overdue ?COVID Booster - Overdue ?TDAP -  Overdue ?Mammogram - Overdue ?BP-  120/70 ( 12/25/21) ?AWV- 6/22 ?CCM- 7/23 ?Lab Results  ?Component Value Date  ? HGBA1C 6.3 (A) 11/06/2021  ? ? ?Star Rating Drugs: ?Ibesartan (Avapro) 150 mg - Last filled 12/05/21 90 DS at [facility] ?Rosuvastatin (Crestor) 10 mg - Last filled 12/05/21 90 DS at [facility] ?Metformin (Glucophage) 250 mg - Last filled 12/05/21 90 DS at  [facility] ? ? ? ?Ned Clines CMA ?Clinical Pharmacist Assistant ?[phone] ? ?

## 2022-02-26 NOTE — Telephone Encounter (Signed)
?*  STAT* If patient is at the pharmacy, call can be transferred to refill team. ? ? ?1. Which medications need to be refilled? (please list name of each medication and dose if known) new prescription for Amlodipine 10 mg ? ?2. Which pharmacy/location (including street and city if local pharmacy) is medication to be sent to? [facility] ? ?3. Do they need a 30 day or 90 day supply? 90 days and refills- need asap- does not have any ? ?

## 2022-02-26 NOTE — Telephone Encounter (Signed)
Called patient, she states that at one time she was on Amlodipine 10 mg- she called in stating that she could not take the 10 mg due to swelling, so she dropped it to 5 mg- Dr.Hilty agreed, as this helped the swelling.  ? ?However she states her blood pressure did not do well on the 5 mg, so she went back to the 10 mg- she thought she told us this, but she forgot. Now she is having swelling in her legs again. I advised I would send a message to Dr.Hilty- we may need to adjust the medications.  ? ?Thank you!  ? ?

## 2022-02-27 ENCOUNTER — Other Ambulatory Visit: Payer: Self-pay

## 2022-02-27 MED ORDER — CARVEDILOL 25 MG PO TABS: ORAL_TABLET | ORAL | 1 refills | Status: DC

## 2022-02-27 NOTE — Telephone Encounter (Signed)
She is already on HCTZ - would go back to 5 mg daily amlodipine if swelling was better on that. BP will not be as good- would advise increase in Coreg to 25 mg BID. ? ?Dr Lemmie Evens ?

## 2022-02-27 NOTE — Telephone Encounter (Signed)
Called patient, made aware of recommendations from MD.  ?Updated medication list for the Carvedilol. ?Patient verbalized understanding. ?Thanks! ? ?

## 2022-03-01 NOTE — Progress Notes (Addendum)
Call to patient per pharm and Carvedilol increase to see how she would like to proceed with the dose change until her current packs end. Patient reports she will remove the existing Carvedilol 12.5 from her packs 2 times a day and take the new dose to be sent to her of 25 mg 2 times a day in its place. ?Pharmacy advised. ? ?Dr Debara Pickett denied increase on amlodipine to 10 mg as noted in chart . ?Patient to remain in 5 mg and is aware. ? ?Ned Clines CMA ?Clinical Pharmacist Assistant ?[phone] ? ?

## 2022-03-02 ENCOUNTER — Other Ambulatory Visit: Payer: Self-pay | Admitting: Internal Medicine

## 2022-03-05 ENCOUNTER — Other Ambulatory Visit: Payer: Self-pay | Admitting: Internal Medicine

## 2022-03-06 ENCOUNTER — Ambulatory Visit (AMBULATORY_SURGERY_CENTER): Payer: PPO | Admitting: *Deleted

## 2022-03-06 VITALS — Ht 64.0 in | Wt 195.0 lb

## 2022-03-06 DIAGNOSIS — Z1211 Encounter for screening for malignant neoplasm of colon: Secondary | ICD-10-CM

## 2022-03-06 MED ORDER — SUTAB 1479-225-188 MG PO TABS: 24.0000 | ORAL_TABLET | ORAL | 0 refills | Status: DC

## 2022-03-06 MED ORDER — ONDANSETRON HCL 4 MG PO TABS: 4.0000 mg | ORAL_TABLET | ORAL | 0 refills | Status: DC

## 2022-03-06 NOTE — Progress Notes (Signed)
No egg or soy allergy known to patient  ?issues known to pt with past sedation with any surgeries or procedures- pt states wakes during sedation  ?Patient denies ever being told they had issues or difficulty with intubation  ?No FH of Malignant Hyperthermia ?Pt is not on diet pills ?Pt is not on  home 02  ?Pt is not on blood thinners  ?Pt denies issues with constipation  ?No A fib or A flutter ? ? NO PA's for preps discussed with pt In PV today  ?Discussed with pt there will be an out-of-pocket cost for prep and that varies from $0 to 70 +  dollars - pt verbalized understanding  ?Pt instructed to use Singlecare.com or GoodRx for a price reduction on prep  ? ?PV completed over the phone. Pt verified name, DOB, address and insurance during PV today.  ?Pt mailed instruction packet with copy of consent form to read and not return, and instructions.  ?Pt encouraged to call with questions or issues.  ?If pt has My chart, procedure instructions sent via My Chart  ?Insurance confirmed with pt at [facility] today  ? ?

## 2022-03-19 ENCOUNTER — Ambulatory Visit: Payer: PPO

## 2022-03-19 ENCOUNTER — Ambulatory Visit
Admission: RE | Admit: 2022-03-19 | Discharge: 2022-03-19 | Disposition: A | Discharge status: Home / Self Care | Payer: PPO | Source: Ambulatory Visit | Attending: Adult Health | Admitting: Adult Health

## 2022-03-19 DIAGNOSIS — Z1231 Encounter for screening mammogram for malignant neoplasm of breast: Secondary | ICD-10-CM | POA: Diagnosis not present

## 2022-03-25 ENCOUNTER — Encounter: Payer: Self-pay | Admitting: Internal Medicine

## 2022-03-27 ENCOUNTER — Ambulatory Visit (AMBULATORY_SURGERY_CENTER): Payer: PPO | Admitting: Internal Medicine

## 2022-03-27 ENCOUNTER — Encounter: Payer: Self-pay | Admitting: Internal Medicine

## 2022-03-27 VITALS — BP 103/40 | HR 61 | Temp 98.6°F | Resp 13 | Ht 64.0 in | Wt 195.0 lb

## 2022-03-27 DIAGNOSIS — Z1211 Encounter for screening for malignant neoplasm of colon: Secondary | ICD-10-CM | POA: Diagnosis not present

## 2022-03-27 DIAGNOSIS — D122 Benign neoplasm of ascending colon: Secondary | ICD-10-CM | POA: Diagnosis not present

## 2022-03-27 DIAGNOSIS — R195 Other fecal abnormalities: Secondary | ICD-10-CM

## 2022-03-27 DIAGNOSIS — I493 Ventricular premature depolarization: Secondary | ICD-10-CM | POA: Diagnosis not present

## 2022-03-27 MED ORDER — SODIUM CHLORIDE 0.9 % IV SOLN: 500.0000 mL | Freq: Once | INTRAVENOUS | Status: DC

## 2022-03-27 NOTE — Op Note (Signed)
[facility] Patient Name: Amber Brewer Procedure Date: 03/27/2022 10:02 AM MRN: [id number] Endoscopist: Docia Chuck. Henrene Pastor , MD Age: 71 Referring MD:  Date of Birth: [birth year] Gender: Female Account #: [id number] Procedure:                Colonoscopy with cold snare polypectomy x 1 Indications:              Screening for colorectal malignant neoplasm.                            Positive Cologuard testing March 2023 Medicines:                Monitored Anesthesia Care Procedure:                Pre-Anesthesia Assessment:                           - Prior to the procedure, a History and Physical                            was performed, and patient medications and                            allergies were reviewed. The patient's tolerance of                            previous anesthesia was also reviewed. The risks                            and benefits of the procedure and the sedation                            options and risks were discussed with the patient.                            All questions were answered, and informed consent                            was obtained. Prior Anticoagulants: The patient has                            taken no previous anticoagulant or antiplatelet                            agents. ASA Grade Assessment: II - A patient with                            mild systemic disease. After reviewing the risks                            and benefits, the patient was deemed in                            satisfactory condition to undergo the procedure.  After obtaining informed consent, the colonoscope                            was passed under direct vision. Throughout the                            procedure, the patient's blood pressure, pulse, and                            oxygen saturations were monitored continuously. The                            Olympus CF-HQ190L [id number]) Colonoscope was                             introduced through the anus and advanced to the the                            cecum, identified by appendiceal orifice and                            ileocecal valve. The ileocecal valve, appendiceal                            orifice, and rectum were photographed. The quality                            of the bowel preparation was excellent. The                            colonoscopy was performed without difficulty. The                            patient tolerated the procedure well. The bowel                            preparation used was SUPREP/tablets via split dose                            instruction. Scope In: 10:09:19 AM Scope Out: 10:24:34 AM Scope Withdrawal Time: 0 hours 10 minutes 43 seconds  Total Procedure Duration: 0 hours 15 minutes 15 seconds  Findings:                 A 3 mm polyp was found in the ascending colon. The                            polyp was removed with a cold snare. Resection and                            retrieval were complete.                           Many small and large-mouthed diverticula were found  in the entire colon.                           The exam was otherwise without abnormality on                            direct and retroflexion views. Complications:            No immediate complications. Estimated blood loss:                            None. Estimated Blood Loss:     Estimated blood loss: none. Impression:               - One 3 mm polyp in the ascending colon, removed                            with a cold snare. Resected and retrieved.                           - Diverticulosis in the entire examined colon.                           - The examination was otherwise normal on direct                            and retroflexion views. Recommendation:           - Repeat colonoscopy in 7 years for surveillance if                            polyp adenomatous. Otherwise, none.                           -  Patient has a contact number available for                            emergencies. The signs and symptoms of potential                            delayed complications were discussed with the                            patient. Return to normal activities tomorrow.                            Written discharge instructions were provided to the                            patient.                           - Resume previous diet.                           - Continue present medications.                           -  Await pathology results. Docia Chuck. Henrene Pastor, MD 03/27/2022 10:30:54 AM This report has been signed electronically.

## 2022-03-27 NOTE — Patient Instructions (Signed)
Handout on polyps, diverticulosis provided   Await pathology results.   Continue current medications.   Repeat colonoscopy in 7 years YOU HAD AN ENDOSCOPIC PROCEDURE TODAY AT [place] [facility]:   Refer to the procedure report that was given to you for any specific questions about what was found during the examination.  If the procedure report does not answer your questions, please call your gastroenterologist to clarify.  If you requested that your care partner not be given the details of your procedure findings, then the procedure report has been included in a sealed envelope for you to review at your convenience later.  YOU SHOULD EXPECT: Some feelings of bloating in the abdomen. Passage of more gas than usual.  Walking can help get rid of the air that was put into your GI tract during the procedure and reduce the bloating. If you had a lower endoscopy (such as a colonoscopy or flexible sigmoidoscopy) you may notice spotting of blood in your stool or on the toilet paper. If you underwent a bowel prep for your procedure, you may not have a normal bowel movement for a few days.  Please Note:  You might notice some irritation and congestion in your nose or some drainage.  This is from the oxygen used during your procedure.  There is no need for concern and it should clear up in a day or so.  SYMPTOMS TO REPORT IMMEDIATELY:  Following lower endoscopy (colonoscopy or flexible sigmoidoscopy):  Excessive amounts of blood in the stool  Significant tenderness or worsening of abdominal pains  Swelling of the abdomen that is new, acute  Fever of 100F or higher  For urgent or emergent issues, a gastroenterologist can be reached at any hour by calling [phone]. Do not use MyChart messaging for urgent concerns.    DIET:  We do recommend a small meal at first, but then you may proceed to your regular diet.  Drink plenty of fluids but you should avoid alcoholic beverages for 24  hours.  ACTIVITY:  You should plan to take it easy for the rest of today and you should NOT DRIVE or use heavy machinery until tomorrow (because of the sedation medicines used during the test).    FOLLOW UP: Our staff will call the number listed on your records 24-72 hours following your procedure to check on you and address any questions or concerns that you may have regarding the information given to you following your procedure. If we do not reach you, we will leave a message.  We will attempt to reach you two times.  During this call, we will ask if you have developed any symptoms of COVID 19. If you develop any symptoms (ie: fever, flu-like symptoms, shortness of breath, cough etc.) before then, please call [phone].  If you test positive for Covid 19 in the 2 weeks post procedure, please call and report this information to [place].    If any biopsies were taken you will be contacted by phone or by letter within the next 1-3 weeks.  Please call us at [phone] if you have not heard about the biopsies in 3 weeks.    SIGNATURES/CONFIDENTIALITY: You and/or your care partner have signed paperwork which will be entered into your electronic medical record.  These signatures attest to the fact that that the information above on your After Visit Summary has been reviewed and is understood.  Full responsibility of the confidentiality of this discharge information lies with you and/or  your care-partner.

## 2022-03-27 NOTE — Progress Notes (Signed)
To pacu, VSS. Report to [email or web address] 

## 2022-03-27 NOTE — Progress Notes (Signed)
HISTORY OF PRESENT ILLNESS:  Amber Brewer is a 71 y.o. female who is sent today for colonoscopy after having had a positive Cologuard test January 17, 2022.  Previous Cologuard testing March 2020 was negative.  Remote colonoscopy 2007 revealed diverticulosis but was otherwise normal.  Recent hemoglobin December 2022 was normal at 13.4.  REVIEW OF SYSTEMS:  All non-GI ROS negative. Past Medical History:  Diagnosis Date   Allergy    Anemia    age 89 only   Asthma    Bowen's disease    ~ 17 yrs ago   Chicken pox    DDD (degenerative disc disease), cervical    Depression    Diabetes mellitus without complication ([place])    on metformin - goes back andf forth   Gallstones    GERD (gastroesophageal reflux disease)    Hx of melanoma in situ 2002   Hx: UTI (urinary tract infection)    Hyperlipidemia    Hypertension    Hypothyroidism    IBS (irritable bowel syndrome)    with D   Melanoma (Amber Brewer)    x2    Nephrolithiasis    Osteoarthritis    PVC's (premature ventricular contractions)    Status post dilation of esophageal narrowing     Past Surgical History:  Procedure Laterality Date   ABDOMINAL HYSTERECTOMY  [place]   BREAST EXCISIONAL BIOPSY     CATARACT EXTRACTION Bilateral 2015   CHOLECYSTECTOMY  1987   COLONOSCOPY     RECTAL SURGERY     ~17 yrs ago- Bowens dx per pt with surgery   removal of melanoma in situ      Social History Amber Brewer  reports that she has never smoked. She has never used smokeless tobacco. She reports that she does not drink alcohol and does not use drugs.  family history includes Alcohol abuse in her father and sister; Arthritis in her father, maternal grandfather, maternal grandmother, mother, paternal grandmother, and sister; Asthma in her maternal grandmother; Breast cancer in her maternal grandmother and paternal grandmother; Clotting disorder in her maternal uncle; Colon cancer in her maternal  uncle; Colon polyps in her maternal uncle; Depression in her father, mother, sister, and sister; Diabetes in her maternal grandfather and maternal uncle; Drug abuse in her father; Endometriosis in her daughter; Hearing loss in her maternal grandfather, maternal grandmother, mother, and sister; Heart attack in her father, paternal grandfather, and paternal grandmother; Heart disease in her father, maternal grandfather, paternal grandfather, and sister; Hyperlipidemia in her paternal grandfather, paternal grandmother, and sister; Hypertension in her father, maternal aunt, maternal grandfather, maternal uncle, mother, and son; Hypotension in her sister; Kidney disease in her maternal grandfather; Miscarriages / Stillbirths in her mother; Prostate cancer in her maternal uncle; Stomach cancer in her maternal grandmother; Thyroid disease in her mother.  Allergies  Allergen Reactions   Codeine Phosphate Nausea And Vomiting    REACTION: unspecified   Prednisone Shortness Of Breath   Betadine [Povidone Iodine] Hives    After surgical skin prep   Sulfa Antibiotics Nausea And Vomiting   Iodine Rash    Broke out on skin once years ago--**Betadine Skin Prep**       PHYSICAL EXAMINATION:  Vital signs: BP (!) 143/48   Pulse 67   Temp 98.6 F (37 C)   Ht '5\' 4"'$  (1.626 m)   Wt 195 lb (88.5 kg)   SpO2 99%   BMI  33.47 kg/m  General: Well-developed, well-nourished, no acute distress HEENT: Sclerae are anicteric, conjunctiva pink. Oral mucosa intact Lungs: Clear Heart: Regular Abdomen: soft, nontender, nondistended, no obvious ascites, no peritoneal signs, normal bowel sounds. No organomegaly. Extremities: No edema Psychiatric: alert and oriented x3. Cooperative     ASSESSMENT:  1.  Positive Cologuard testing 2.  Colon cancer screening  PLAN:  1.  Colonoscopy

## 2022-03-28 ENCOUNTER — Telehealth: Payer: Self-pay | Admitting: *Deleted

## 2022-03-28 ENCOUNTER — Telehealth: Payer: Self-pay

## 2022-03-28 NOTE — Telephone Encounter (Signed)
Unable to leave message ,no answering machine reached on f/u call

## 2022-03-28 NOTE — Telephone Encounter (Signed)
pt son answered phone and states pt to call if any concerns but states she is doing fine right now.

## 2022-04-01 ENCOUNTER — Encounter: Payer: Self-pay | Admitting: Internal Medicine

## 2022-04-09 ENCOUNTER — Ambulatory Visit (INDEPENDENT_AMBULATORY_CARE_PROVIDER_SITE_OTHER): Payer: PPO

## 2022-04-09 VITALS — BP 129/64 | Ht 64.0 in | Wt 197.0 lb

## 2022-04-09 DIAGNOSIS — Z0000 Encounter for general adult medical examination without abnormal findings: Secondary | ICD-10-CM | POA: Diagnosis not present

## 2022-04-09 NOTE — Patient Instructions (Addendum)
Amber Brewer , Thank you for taking time to come for your Medicare Wellness Visit. I appreciate your ongoing commitment to your health goals. Please review the following plan we discussed and let me know if I can assist you in the future.   These are the goals we discussed:  Goals       Chronic Care Management      CARE PLAN ENTRY  Current Barriers:  Chronic Disease Management support, education, and care coordination needs related to Hypertension, Hyperlipidemia, and Diabetes   Hypertension BP Readings from Last 3 Encounters:  06/08/20 140/80  03/28/20 122/64  03/02/20 132/80  Pharmacist Clinical Goal(s): Over the next 90 days, patient will work with PharmD and providers to maintain BP goal <140/90 Current regimen:  Carvedilol 12.5 mg 1 tablet twice daily with a meal HCTZ 25 mg 1 tablet daily at lunch Irbesartan 150 mg 1 tablet in the morning and at night Interventions: Discussed low salt diet and exercising as tolerated extensively Discussed the benefits of weight loss Patient self care activities - Over the next 90 days, patient will: Check BP once a week, document, and provide at future appointments Ensure daily salt intake < 2300 mg/day Continue lifting weights and yoga in moderation 3 times a week for 30 mins  Hyperlipidemia Lab Results  Component Value Date/Time   LDLCALC 171 (H) 06/08/2020 09:25 AM   LDLDIRECT 143.5 01/15/2011 08:24 AM  Pharmacist Clinical Goal(s): Over the next 90 days, patient will work with PharmD and providers to achieve LDL goal < 100 Current regimen:  Rosuvastatin 10 mg 1 tablet daily Interventions: Discussed low cholesterol diet and exercising as tolerated extensively Counseled patient on the risk vs benefit on statin Patient self care activities - Over the next 90 days, patient will: Continue with lifestyle modifications Avoid fast food Continue yoga and lifting light weights  Diabetes Lab Results  Component Value Date/Time   HGBA1C  5.6 06/08/2020 08:58 AM   HGBA1C 6.5 03/02/2020 07:32 AM   HGBA1C 6.1 12/08/2018 09:30 AM  Pharmacist Clinical Goal(s): Over the next 90 days, patient will work with PharmD and providers to achieve A1c goal <6.5% Current regimen:  Metformin 500 mg 0.5 tablet daily with breakfast Interventions: Discussed carbohydrate counting and exercising as tolerated extensively Patient self care activities - Over the next 90 days, patient will: Check blood sugar once daily, document, and provide at future appointments Contact provider with any episodes of hypoglycemia Continue to lose weight at least 10 lbs a month  Continue lifting weights and yoga  Medication management Pharmacist Clinical Goal(s): Over the next 90 days, patient will work with PharmD and providers to achieve optimal medication adherence Current pharmacy: [facility] Interventions Comprehensive medication review performed. Utilize [facility] for medication synchronization, packaging and delivery Patient self care activities - Over the next 90 days, patient will: Focus on medication adherence by medication synchronization and delivery Take medications as prescribed Report any questions or concerns to PharmD and/or provider(s)  Initial goal documentation       Increase physical activity (pt-stated)      I would like to lose weight.      Weight Loss Achieved      Evidence-based guidance:  Review medication that may contribute to weight gain, such as corticosteroid, beta-blocker, tricyclic antidepressant, oral antihyperglycemic; advocate for changes when appropriate.  Perform or refer to registered dietitian to perform comprehensive nutrition assessment that includes disordered-eating behaviors, such as binge-eating, emotional or compulsive eating, grazing.  Counsel patient regarding  health risks of obesity and that weight loss goal of 5 to 10 percent of initial weight will improve risk.  Recommend initial weight  loss goal of 3 to 5 percent of bodyweight; increase weight-loss goals based on patient success as achieving greater weight loss continues to reduce risk.  Propose a calorie-reduced diet based on the patient's preferences and health status.  Provide ongoing emotional support or cognitive behavioral therapy and dietitian services (individual, group, virtual) over at least 6 months with a minimum of 14 encounters to best facilitate weight loss.  Provide monthly follow-up for 12 months when weight loss goal is met to assist with maintenance of weight loss.  Encourage increased physical activity or exercise based on individual age, risk, and ability up to 200 to 300 minutes per week that includes aerobic and resistance training.  Encourage reduction in sedentary behaviors by replacing them with nonexercise yet active leisure pursuits.  Identify physical barriers, such as change in posture, balance, gait patterns, joint pain, and environmental barriers to activity.  Consider referral to rehabilitation therapy, especially when mobility or function is impaired due to osteoarthritis and obesity.  Consider referral to weight-loss program that has published evidence of safety and efficacy if on-site intensive intervention is unavailable or patient preference.  Prepare patient for use of pharmacologic therapy as an adjunct to lifestyle changes based on body mass index, patient agreement and presence of risk factors or comorbidities.  Evaluate efficacy of pharmacologic therapy (weight loss) and tolerance to medication periodically.  Engage in shared decision-making regarding referral to bariatric surgeon for consultation and evaluation when weight-loss goal has not been accomplished by behavioral therapy with or without pharmacologic therapy.   Notes:         This is a list of the screening recommended for you and due dates:  Health Maintenance  Topic Date Due   COVID-19 Vaccine (4 - Booster for Pfizer  series) 04/25/2022*   Zoster (Shingles) Vaccine (1 of 2) 07/10/2022*   Tetanus Vaccine  04/10/2023*   Flu Shot  05/21/2022   Mammogram  03/20/2023   Cologuard (Stool DNA test)  01/17/2025   Pneumonia Vaccine  Completed   DEXA scan (bone density measurement)  Completed   Hepatitis C Screening: USPSTF Recommendation to screen - Ages 30-79 yo.  Completed   HPV Vaccine  Aged Out  *Topic was postponed. The date shown is not the original due date.   Advanced directives: Yes  Conditions/risks identified: None  Next appointment: Follow up in one year for your annual wellness visit     Preventive Care 65 Years and Older, Female Preventive care refers to lifestyle choices and visits with your health care provider that can promote health and wellness. What does preventive care include? A yearly physical exam. This is also called an annual well check. Dental exams once or twice a year. Routine eye exams. Ask your health care provider how often you should have your eyes checked. Personal lifestyle choices, including: Daily care of your teeth and gums. Regular physical activity. Eating a healthy diet. Avoiding tobacco and drug use. Limiting alcohol use. Practicing safe sex. Taking low-dose aspirin every day. Taking vitamin and mineral supplements as recommended by your health care provider. What happens during an annual well check? The services and screenings done by your health care provider during your annual well check will depend on your age, overall health, lifestyle risk factors, and family history of disease. Counseling  Your health care provider may ask you questions about  your: Alcohol use. Tobacco use. Drug use. Emotional well-being. Home and relationship well-being. Sexual activity. Eating habits. History of falls. Memory and ability to understand (cognition). Work and work Statistician. Reproductive health. Screening  You may have the following tests or  measurements: Height, weight, and BMI. Blood pressure. Lipid and cholesterol levels. These may be checked every 5 years, or more frequently if you are over 14 years old. Skin check. Lung cancer screening. You may have this screening every year starting at age 21 if you have a 30-pack-year history of smoking and currently smoke or have quit within the past 15 years. Fecal occult blood test (FOBT) of the stool. You may have this test every year starting at age 70. Flexible sigmoidoscopy or colonoscopy. You may have a sigmoidoscopy every 5 years or a colonoscopy every 10 years starting at age 78. Hepatitis C blood test. Hepatitis B blood test. Sexually transmitted disease (STD) testing. Diabetes screening. This is done by checking your blood sugar (glucose) after you have not eaten for a while (fasting). You may have this done every 1-3 years. Bone density scan. This is done to screen for osteoporosis. You may have this done starting at age 63. Mammogram. This may be done every 1-2 years. Talk to your health care provider about how often you should have regular mammograms. Talk with your health care provider about your test results, treatment options, and if necessary, the need for more tests. Vaccines  Your health care provider may recommend certain vaccines, such as: Influenza vaccine. This is recommended every year. Tetanus, diphtheria, and acellular pertussis (Tdap, Td) vaccine. You may need a Td booster every 10 years. Zoster vaccine. You may need this after age 20. Pneumococcal 13-valent conjugate (PCV13) vaccine. One dose is recommended after age 25. Pneumococcal polysaccharide (PPSV23) vaccine. One dose is recommended after age 31. Talk to your health care provider about which screenings and vaccines you need and how often you need them. This information is not intended to replace advice given to you by your health care provider. Make sure you discuss any questions you have with your  health care provider. Document Released: 11/03/2015 Document Revised: 06/26/2016 Document Reviewed: 08/08/2015 Elsevier Interactive Patient Education  2017 [employer] Prevention in the Home Falls can cause injuries. They can happen to people of all ages. There are many things you can do to make your home safe and to help prevent falls. What can I do on the outside of my home? Regularly fix the edges of walkways and driveways and fix any cracks. Remove anything that might make you trip as you walk through a door, such as a raised step or threshold. Trim any bushes or trees on the path to your home. Use bright outdoor lighting. Clear any walking paths of anything that might make someone trip, such as rocks or tools. Regularly check to see if handrails are loose or broken. Make sure that both sides of any steps have handrails. Any raised decks and porches should have guardrails on the edges. Have any leaves, snow, or ice cleared regularly. Use sand or salt on walking paths during winter. Clean up any spills in your garage right away. This includes oil or grease spills. What can I do in the bathroom? Use night lights. Install grab bars by the toilet and in the tub and shower. Do not use towel bars as grab bars. Use non-skid mats or decals in the tub or shower. If you need to sit down in  the shower, use a plastic, non-slip stool. Keep the floor dry. Clean up any water that spills on the floor as soon as it happens. Remove soap buildup in the tub or shower regularly. Attach bath mats securely with double-sided non-slip rug tape. Do not have throw rugs and other things on the floor that can make you trip. What can I do in the bedroom? Use night lights. Make sure that you have a light by your bed that is easy to reach. Do not use any sheets or blankets that are too big for your bed. They should not hang down onto the floor. Have a firm chair that has side arms. You can use this for  support while you get dressed. Do not have throw rugs and other things on the floor that can make you trip. What can I do in the kitchen? Clean up any spills right away. Avoid walking on wet floors. Keep items that you use a lot in easy-to-reach places. If you need to reach something above you, use a strong step stool that has a grab bar. Keep electrical cords out of the way. Do not use floor polish or wax that makes floors slippery. If you must use wax, use non-skid floor wax. Do not have throw rugs and other things on the floor that can make you trip. What can I do with my stairs? Do not leave any items on the stairs. Make sure that there are handrails on both sides of the stairs and use them. Fix handrails that are broken or loose. Make sure that handrails are as long as the stairways. Check any carpeting to make sure that it is firmly attached to the stairs. Fix any carpet that is loose or worn. Avoid having throw rugs at the top or bottom of the stairs. If you do have throw rugs, attach them to the floor with carpet tape. Make sure that you have a light switch at the top of the stairs and the bottom of the stairs. If you do not have them, ask someone to add them for you. What else can I do to help prevent falls? Wear shoes that: Do not have high heels. Have rubber bottoms. Are comfortable and fit you well. Are closed at the toe. Do not wear sandals. If you use a stepladder: Make sure that it is fully opened. Do not climb a closed stepladder. Make sure that both sides of the stepladder are locked into place. Ask someone to hold it for you, if possible. Clearly mark and make sure that you can see: Any grab bars or handrails. First and last steps. Where the edge of each step is. Use tools that help you move around (mobility aids) if they are needed. These include: Canes. Walkers. Scooters. Crutches. Turn on the lights when you go into a dark area. Replace any light bulbs as soon  as they burn out. Set up your furniture so you have a clear path. Avoid moving your furniture around. If any of your floors are uneven, fix them. If there are any pets around you, be aware of where they are. Review your medicines with your doctor. Some medicines can make you feel dizzy. This can increase your chance of falling. Ask your doctor what other things that you can do to help prevent falls. This information is not intended to replace advice given to you by your health care provider. Make sure you discuss any questions you have with your health care provider. Document Released:  08/03/2009 Document Revised: 03/14/2016 Document Reviewed: 11/11/2014 Elsevier Interactive Patient Education  2017 [employer].

## 2022-04-09 NOTE — Progress Notes (Signed)
Subjective:   Amber Brewer is a 71 y.o. female who presents for Medicare Annual (Subsequent) preventive examination.  Review of Systems    Virtual Visit via Telephone Note  I connected with  Amber Brewer on 04/09/22 at  9:45 AM EDT by telephone and verified that I am speaking with the correct person using two identifiers.  Location: Patient: Home Provider: Office Persons participating in the virtual visit: patient/Nurse Health Advisor   I discussed the limitations, risks, security and privacy concerns of performing an evaluation and management service by telephone and the availability of in person appointments. The patient expressed understanding and agreed to proceed.  Interactive audio and video telecommunications were attempted between this nurse and patient, however failed, due to patient having technical difficulties OR patient did not have access to video capability.  We continued and completed visit with audio only.  Some vital signs may be absent or patient reported.   Criselda Peaches, LPN  Cardiac Risk Factors include: advanced age (>33mn, >>59women);diabetes mellitus;hypertension     Objective:    Today's Vitals   04/09/22 0951  BP: 129/64  Weight: 197 lb (89.4 kg)  Height: '5\' 4"'$  (1.626 m)   Body mass index is 33.81 kg/m.     04/09/2022   10:07 AM 04/02/2021   10:41 AM 05/06/2017    6:54 PM 04/18/2017    8:51 AM  Advanced Directives  Does Patient Have a Medical Advance Directive? Yes Yes No No  Type of AParamed[place]of [place] will [place] will    Does patient want to make changes to medical advance directive? No - Patient declined     Copy of [place] Chart? Yes - validated most recent copy scanned in chart (See row information) No - copy requested    Would patient like information on creating a medical advance directive?   No - Patient declined     Current Medications  (verified) Outpatient Encounter Medications as of 04/09/2022  Medication Sig   albuterol (VENTOLIN HFA) 108 (90 Base) MCG/ACT inhaler INHALE 1-2 PUFFS into lings EVERY 4 HOURS AS NEEDED   amLODipine (NORVASC) 5 MG tablet Take 1 tablet (5 mg total) by mouth daily.   budesonide (RHINOCORT AQUA) 32 MCG/ACT nasal spray Place 1 spray into both nostrils daily as needed for allergies.    carvedilol (COREG) 25 MG tablet TAKE ONE TABLET BY MOUTH EVERY MORNING and TAKE ONE TABLET BY MOUTH EVERY EVENING   Cholecalciferol (VITAMIN D) 50 MCG (2000 UT) tablet Take 2,000 Units by mouth daily.    citalopram (CELEXA) 10 MG tablet TAKE 1/2 TABLET BY MOUTH EVERY MORNING   Continuous Blood Gluc Sensor (FREESTYLE LIBRE 2 SENSOR) MISC 1 Device by Does not apply route every 14 (fourteen) days.   cycloSPORINE (RESTASIS) 0.05 % ophthalmic emulsion Place 1 drop into both eyes 2 (two) times daily.   DOTTI 0.05 MG/24HR patch PLACE ONE PATCH onto THE SKIN TWICE WEEKLY   famotidine (PEPCID) 20 MG tablet Take 1 tablet (20 mg total) by mouth every evening.   Fluticasone-Umeclidin-Vilant (TRELEGY ELLIPTA) 200-62.5-25 MCG/ACT AEPB INHALE 1 PUFF BY MOUTH INTO LUNGS DAILY   hydrochlorothiazide (HYDRODIURIL) 25 MG tablet TAKE ONE TABLET BY MOUTH EVERY MORNING   irbesartan (AVAPRO) 150 MG tablet Take 1 tablet (150 mg total) by mouth 2 (two) times daily.   levothyroxine (SYNTHROID) 50 MCG tablet TAKE ONE TABLET BY MOUTH EVERY EVENING (Patient taking differently: TAKE  ONE TABLET BY MOUTH EVERY MORNING)   metFORMIN (GLUCOPHAGE) 500 MG tablet Take 0.5 tablets (250 mg total) by mouth daily with breakfast.   minoxidil (ROGAINE) 2 % external solution Apply topically daily.   Multiple Vitamins-Minerals (ALIVE WOMENS GUMMY PO) Take 1 tablet by mouth daily.   multivitamin-lutein (OCUVITE-LUTEIN) CAPS capsule Take 1 capsule by mouth daily.   ondansetron (ZOFRAN) 4 MG tablet Take 1 tablet (4 mg total) by mouth as directed.   potassium chloride  (KLOR-CON) 10 MEQ tablet TAKE ONE TABLET BY MOUTH EVERY EVENING   rosuvastatin (CRESTOR) 10 MG tablet TAKE ONE TABLET BY MOUTH every other evening   triamcinolone (NASACORT) 55 MCG/ACT AERO nasal inhaler INSTILL 1 SPRAY ONCE DAILY   TURMERIC PO Take 500 mg by mouth daily.    No facility-administered encounter medications on file as of 04/09/2022.    Allergies (verified) Codeine phosphate, Prednisone, Betadine [povidone iodine], Sulfa antibiotics, and Iodine   History: Past Medical History:  Diagnosis Date   Allergy    Anemia    age 12 only   Asthma    Bowen's disease    ~ 17 yrs ago   Chicken pox    DDD (degenerative disc disease), cervical    Depression    Diabetes mellitus without complication ([place])    on metformin - goes back andf forth   Gallstones    GERD (gastroesophageal reflux disease)    Hx of melanoma in situ 2002   Hx: UTI (urinary tract infection)    Hyperlipidemia    Hypertension    Hypothyroidism    IBS (irritable bowel syndrome)    with D   Melanoma ([place])    x2    Nephrolithiasis    Osteoarthritis    PVC's (premature ventricular contractions)    Status post dilation of esophageal narrowing    Past Surgical History:  Procedure Laterality Date   ABDOMINAL HYSTERECTOMY  1974   APPENDECTOMY  1977   BLADDER SURGERY  1974   BREAST EXCISIONAL BIOPSY     CATARACT EXTRACTION Bilateral 2015   CHOLECYSTECTOMY  1987   COLONOSCOPY     RECTAL SURGERY     ~17 yrs ago- Bowens dx per pt with surgery   removal of melanoma in situ     Family History  Problem Relation Age of Onset   Hypertension Mother    Thyroid disease Mother    Arthritis Mother    Depression Mother    Hearing loss Mother    Miscarriages / Stillbirths Mother    Heart attack Father    Heart disease Father    Alcohol abuse Father    Arthritis Father    Depression Father    Drug abuse Father    Hypertension Father    Alcohol abuse Sister    Arthritis Sister    Depression Sister     Hearing loss Sister    Hypotension Sister    Depression Sister    Heart disease Sister    Hyperlipidemia Sister    Hypertension Maternal Aunt    Hypertension Maternal Uncle    Clotting disorder Maternal Uncle    Prostate cancer Maternal Uncle    Colon cancer Maternal Uncle    Colon polyps Maternal Uncle    Diabetes Maternal Uncle        all uncles   Arthritis Maternal Grandmother    Asthma Maternal Grandmother    Breast cancer Maternal Grandmother    Stomach cancer Maternal Grandmother    Hearing loss  Maternal Grandmother    Diabetes Maternal Grandfather    Heart disease Maternal Grandfather    Arthritis Maternal Grandfather    Hearing loss Maternal Grandfather    Hypertension Maternal Grandfather    Kidney disease Maternal Grandfather    Hyperlipidemia Paternal Grandmother        also MI   Breast cancer Paternal Grandmother    Arthritis Paternal Grandmother    Heart attack Paternal Grandmother    Hyperlipidemia Paternal Grandfather        also MI   Heart disease Paternal Grandfather    Heart attack Paternal Grandfather    Endometriosis Daughter    Hypertension Son    Esophageal cancer Neg Hx    Rectal cancer Neg Hx    Social History   Socioeconomic History   Marital status: Married    Spouse name: Not on file   Number of children: 2   Years of education: 13   Highest education level: Not on file  Occupational History   Occupation: licensed optician/retired    Employer: OTHER    Comment: Office manager  Tobacco Use   Smoking status: Never   Smokeless tobacco: Never  Vaping Use   Vaping Use: Never used  Substance and Sexual Activity   Alcohol use: No   Drug use: No   Sexual activity: Yes    Birth control/protection: Post-menopausal, Surgical  Other Topics Concern   Not on file  Social History Narrative   epworth sleepiness scale = 8 (01/18/2016)   Lives at home with her husband and disabled son   Right handed   Caffeine: decaf   Social Determinants  of Health   Financial Resource Strain: Low Risk  (04/09/2022)   Overall Financial Resource Strain (CARDIA)    Difficulty of Paying Living Expenses: Not hard at all  Food Insecurity: No Food Insecurity (04/09/2022)   Hunger Vital Sign    Worried About Running Out of Food in the Last Year: Never true    Ran Out of Food in the Last Year: Never true  Transportation Needs: No Transportation Needs (04/09/2022)   PRAPARE - Hydrologist (Medical): No    Lack of Transportation (Non-Medical): No  Physical Activity: Inactive (04/09/2022)   Exercise Vital Sign    Days of Exercise per Week: 0 days    Minutes of Exercise per Session: 0 min  Stress: No Stress Concern Present (04/09/2022)   [place]    Feeling of Stress : Not at all  Social Connections: [place] (04/09/2022)   Social Connection and Isolation Panel [NHANES]    Frequency of Communication with Friends and Family: More than three times a week    Frequency of Social Gatherings with Friends and Family: More than three times a week    Attends Religious Services: More than 4 times per year    Active Member of [employer] or Organizations: Yes    Attends Music therapist: More than 4 times per year    Marital Status: Married     Clinical Intake:  Pre-visit preparation completed: NoNutrition Risk Assessment:  Has the patient had any N/V/D within the last 2 months?  No  Does the patient have any non-healing wounds?  No  Has the patient had any unintentional weight loss or weight gain?  No   Diabetes:  Is the patient diabetic?  Yes  If diabetic, was a CBG obtained today?  Yes  Did  the patient bring in their glucometer from home? CBG 116 Taken by patient How often do you monitor your CBG's? Daily.   Financial Strains and Diabetes Management:  Are you having any financial strains with the device, your supplies or your  medication? No .  Does the patient want to be seen by Chronic Care Management for management of their diabetes?  No  Would the patient like to be referred to a Nutritionist or for Diabetic Management?  No   Diabetic Exams:  Diabetic Eye Exam: Completed Yes. Overdue for diabetic eye exam. Pt has been advised about the importance in completing this exam. A referral has been placed today. Message sent to referral coordinator for scheduling purposes. Advised pt to expect a call from office referred to regarding appt.  Diabetic Foot Exam: Completed Yes. Pt has been advised about the importance in completing this exam. Pt is scheduled for diabetic foot exam on Followed by PCP.    Pain : No/denies pain Diabetic?  Yes  Interpreter Needed?: No  Activities of Daily Living    04/09/2022   10:03 AM  In your present state of health, do you have any difficulty performing the following activities:  Hearing? 0  Vision? 0  Difficulty concentrating or making decisions? 0  Walking or climbing stairs? 0  Dressing or bathing? 0  Doing errands, shopping? 0  Preparing Food and eating ? N  Using the Toilet? N  In the past six months, have you accidently leaked urine? Y  Comment Wears pads. Followed by PCP  Do you have problems with loss of bowel control? N  Managing your Medications? N  Managing your Finances? N  Housekeeping or managing your Housekeeping? N    Patient Care Team: [name], NP as PCP - General (Family Medicine) Neldon Mc, Donnamarie Poag, MD as Consulting Physician (Allergy and Immunology) Viona Gilmore, [facility] as Pharmacist (Pharmacist)  Indicate any recent Medical Services you may have received from other than [facility] providers in the past year (date may be approximate).     Assessment:   This is a routine wellness examination for [place].  Hearing/Vision screen Hearing Screening - Comments:: No hearing difficulty Vision Screening - Comments:: Wears glasses. Followed by [facility]  Dietary issues and exercise activities discussed: Exercise limited by: None identified   Goals Addressed               This Visit's Progress     Increase physical activity (pt-stated)        I would like to lose weight.       Depression Screen    04/09/2022   10:01 AM 09/26/2021    4:02 PM 06/05/2021    9:52 AM 04/02/2021   10:43 AM 03/02/2020    7:13 AM  PHQ 2/9 Scores  PHQ - 2 Score 0 0 0 0 0  PHQ- 9 Score  0       Fall Risk    04/09/2022   10:05 AM 04/02/2021   10:42 AM 03/02/2020    7:13 AM 03/02/2020    7:04 AM 06/03/2018   10:29 AM  Fall Risk   Falls in the past year? 0 0 0 0 Yes  Number falls in past yr: 0 0   1  Injury with Fall? 0 0   Yes  Comment     leg bruise  Risk for fall due to : No Fall Risks No Fall Risks     Follow up  Falls prevention discussed    FALL RISK PREVENTION PERTAINING TO THE HOME:  Any stairs in or around the home? Yes  If so, are there any without handrails? No  Home free of loose throw rugs in walkways, pet beds, electrical cords, etc? Yes  Adequate lighting in your home to reduce risk of falls? Yes   ASSISTIVE DEVICES UTILIZED TO PREVENT FALLS:  Life alert? No  Use of a cane, walker or w/c? No  Grab bars in the bathroom? No  Shower chair or bench in shower? No  Elevated toilet seat or a handicapped toilet? No   TIMED UP AND GO:  Was the test performed? No . Audio Visit  Cognitive Function:      04/09/2022   10:07 AM  6CIT Screen  What Year? 0 points  What month? 0 points  What time? 0 points  Count back from 20 0 points  Months in reverse 0 points  Repeat phrase 0 points  Total Score 0 points    Immunizations Immunization History  Administered Date(s) Administered   Influenza Split 09/05/2015   Influenza Whole 09/10/2007, 07/20/2009, 07/27/2011   Influenza, High Dose Seasonal PF 07/24/2019   Influenza,inj,Quad PF,6+ Mos 08/22/2018   Influenza-Unspecified 07/05/2020, 07/25/2021   PFIZER(Purple  Top)SARS-COV-2 Vaccination 12/14/2019, 01/04/2020, 09/18/2020   Pneumococcal Conjugate-13 12/10/2016   Pneumococcal Polysaccharide-23 12/16/2017   Td 10/21/2002   Tdap 01/29/2011    TDAP status: Due, Education has been provided regarding the importance of this vaccine. Advised may receive this vaccine at local pharmacy or Health Dept. Aware to provide a copy of the vaccination record if obtained from local pharmacy or Health Dept. Verbalized acceptance and understanding.  Flu Vaccine status: Up to date  Pneumococcal vaccine status: Up to date  Covid-19 vaccine status: Completed vaccines  Qualifies for Shingles Vaccine? Yes   Zostavax completed No   Shingrix Completed?: No.    Education has been provided regarding the importance of this vaccine. Patient has been advised to call insurance company to determine out of pocket expense if they have not yet received this vaccine. Advised may also receive vaccine at local pharmacy or Health Dept. Verbalized acceptance and understanding.  Screening Tests Health Maintenance  Topic Date Due   COVID-19 Vaccine (4 - Booster for Pfizer series) 04/25/2022 (Originally 11/13/2020)   Zoster Vaccines- Shingrix (1 of 2) 07/10/2022 (Originally 01/08/1970)   TETANUS/TDAP  04/10/2023 (Originally 01/28/2021)   INFLUENZA VACCINE  05/21/2022   MAMMOGRAM  03/20/2023   Fecal DNA (Cologuard)  01/17/2025   Pneumonia Vaccine 70+ Years old  Completed   DEXA SCAN  Completed   Hepatitis C Screening  Completed   HPV VACCINES  Aged Out    Health Maintenance  There are no preventive care reminders to display for this patient.   Colorectal cancer screening: Type of screening: Cologuard. Completed 01/17/22. Repeat every 3 years  Mammogram status: Completed 03/19/22. Repeat every year  Bone Density status: Completed 10/22/17. Results reflect: Bone density results: OSTEOPENIA. Repeat every 5 years.  Lung Cancer Screening: (Low Dose CT Chest recommended if Age 69-80  years, 30 pack-year currently smoking OR have quit w/in 15years.) does not qualify.    Additional Screening:  Hepatitis C Screening: does qualify; Completed 10/22/15  Vision Screening: Recommended annual ophthalmology exams for early detection of glaucoma and other disorders of the eye. Is the patient up to date with their annual eye exam?  Yes  Who is the provider or what is the name of the office  in which the patient attends annual eye exams? [facility] If pt is not established with a provider, would they like to be referred to a provider to establish care? No .   Dental Screening: Recommended annual dental exams for proper oral hygiene  Community Resource Referral / Chronic Care Management:   CRR required this visit?  No   CCM required this visit?  No      Plan:     I have personally reviewed and noted the following in the patient's chart:   Medical and social history Use of alcohol, tobacco or illicit drugs  Current medications and supplements including opioid prescriptions.  Functional ability and status Nutritional status Physical activity Advanced directives List of other physicians Hospitalizations, surgeries, and ER visits in previous 12 months Vitals Screenings to include cognitive, depression, and falls Referrals and appointments  In addition, I have reviewed and discussed with patient certain preventive protocols, quality metrics, and best practice recommendations. A written personalized care plan for preventive services as well as general preventive health recommendations were provided to patient.     Criselda Peaches, LPN   [id number]/41/[id number]   Nurse Notes: None

## 2022-04-30 ENCOUNTER — Telehealth: Payer: Self-pay | Admitting: Pharmacist

## 2022-04-30 NOTE — Chronic Care Management (AMB) (Signed)
    Chronic Care Management Pharmacy Assistant   Name: Amber Brewer  MRN: [id number] DOB: [birth year]  04/30/22 APPOINTMENT REMINDER  Patient was reminded to have all medications, supplements and any blood glucose and blood pressure readings available for review with Jeni Salles, Pharm. D, for telephone visit on 05/01/22 at 10:30.    Care Gaps: COVID Booster - Overdue Zoster Vaccine - Postponed TDAP - Postponed AWV- 04/09/22 BP- 122/70 12/25/21 Lab Results  Component Value Date   HGBA1C 6.3 (A) 11/06/2021     Star Rating Drug: Ibesartan (Avapro) 150 mg - Last filled 03/05/22 90 DS at [facility] Rosuvastatin (Crestor) 10 mg - Last filled 03/05/22 90 DS at [facility] Metformin (Glucophage) 250 mg - Last filled 03/05/22 90 DS at  [facility]  Any gaps in medications fill history? None     Medications: Outpatient Encounter Medications as of 04/30/2022  Medication Sig   albuterol (VENTOLIN HFA) 108 (90 Base) MCG/ACT inhaler INHALE 1-2 PUFFS into lings EVERY 4 HOURS AS NEEDED   amLODipine (NORVASC) 5 MG tablet Take 1 tablet (5 mg total) by mouth daily.   budesonide (RHINOCORT AQUA) 32 MCG/ACT nasal spray Place 1 spray into both nostrils daily as needed for allergies.    carvedilol (COREG) 25 MG tablet TAKE ONE TABLET BY MOUTH EVERY MORNING and TAKE ONE TABLET BY MOUTH EVERY EVENING   Cholecalciferol (VITAMIN D) 50 MCG (2000 UT) tablet Take 2,000 Units by mouth daily.    citalopram (CELEXA) 10 MG tablet TAKE 1/2 TABLET BY MOUTH EVERY MORNING   Continuous Blood Gluc Sensor (FREESTYLE LIBRE 2 SENSOR) MISC 1 Device by Does not apply route every 14 (fourteen) days.   cycloSPORINE (RESTASIS) 0.05 % ophthalmic emulsion Place 1 drop into both eyes 2 (two) times daily.   DOTTI 0.05 MG/24HR patch PLACE ONE PATCH onto THE SKIN TWICE WEEKLY   famotidine (PEPCID) 20 MG tablet Take 1 tablet (20 mg total) by mouth every evening.   Fluticasone-Umeclidin-Vilant (TRELEGY ELLIPTA) 200-62.5-25 MCG/ACT AEPB  INHALE 1 PUFF BY MOUTH INTO LUNGS DAILY   hydrochlorothiazide (HYDRODIURIL) 25 MG tablet TAKE ONE TABLET BY MOUTH EVERY MORNING   irbesartan (AVAPRO) 150 MG tablet Take 1 tablet (150 mg total) by mouth 2 (two) times daily.   levothyroxine (SYNTHROID) 50 MCG tablet TAKE ONE TABLET BY MOUTH EVERY EVENING (Patient taking differently: TAKE ONE TABLET BY MOUTH EVERY MORNING)   metFORMIN (GLUCOPHAGE) 500 MG tablet Take 0.5 tablets (250 mg total) by mouth daily with breakfast.   minoxidil (ROGAINE) 2 % external solution Apply topically daily.   Multiple Vitamins-Minerals (ALIVE WOMENS GUMMY PO) Take 1 tablet by mouth daily.   multivitamin-lutein (OCUVITE-LUTEIN) CAPS capsule Take 1 capsule by mouth daily.   ondansetron (ZOFRAN) 4 MG tablet Take 1 tablet (4 mg total) by mouth as directed.   potassium chloride (KLOR-CON) 10 MEQ tablet TAKE ONE TABLET BY MOUTH EVERY EVENING   rosuvastatin (CRESTOR) 10 MG tablet TAKE ONE TABLET BY MOUTH every other evening   triamcinolone (NASACORT) 55 MCG/ACT AERO nasal inhaler INSTILL 1 SPRAY ONCE DAILY   TURMERIC PO Take 500 mg by mouth daily.    No facility-administered encounter medications on file as of 04/30/2022.       [place] Clinical Pharmacist Assistant [phone]

## 2022-05-01 ENCOUNTER — Ambulatory Visit (INDEPENDENT_AMBULATORY_CARE_PROVIDER_SITE_OTHER): Payer: PPO | Admitting: Pharmacist

## 2022-05-01 ENCOUNTER — Other Ambulatory Visit: Payer: Self-pay | Admitting: Adult Health

## 2022-05-01 DIAGNOSIS — F321 Major depressive disorder, single episode, moderate: Secondary | ICD-10-CM

## 2022-05-01 DIAGNOSIS — R7303 Prediabetes: Secondary | ICD-10-CM

## 2022-05-01 DIAGNOSIS — I10 Essential (primary) hypertension: Secondary | ICD-10-CM

## 2022-05-01 MED ORDER — BLOOD GLUCOSE METER KIT: PACK | 0 refills | Status: AC

## 2022-05-01 NOTE — Progress Notes (Signed)
Chronic Care Management Pharmacy Note  05/01/2022 Name:  Amber Brewer MRN:  [id number] DOB:  1951/01/04  Summary: A1c at goal < 6.5% Pt reports Freestyle Elenor Legato is not working properly  Recommendations/Changes made from today's visit: -Recommended bringing BP cuff to next office visit to ensure accuracy -Recommended prescriptions for glucometer testing supplies to compare to [employer] -Scheduled CPE  Plan: Follow up to troubleshoot with Freestyle libre  Subjective: Amber Brewer is an 71 y.o. year old female who is a primary patient of [name], NP.  The CCM team was consulted for assistance with disease management and care coordination needs.    Engaged with patient by telephone for follow up visit in response to provider referral for pharmacy case management and/or care coordination services.   Consent to Services:  The patient was given information about Chronic Care Management services, agreed to services, and gave verbal consent prior to initiation of services.  Please see initial visit note for detailed documentation.   Patient Care Team: [name], NP as PCP - General (Family Medicine) Neldon Mc Donnamarie Poag, MD as Consulting Physician (Allergy and Immunology) Viona Gilmore, [facility] as Pharmacist (Pharmacist)  Recent office visits: 04/09/22 Rolene Arbour, LPN: Patient presented for AWV.  11/06/21 [name], NP - Patient presented for Pre-diabetes and other concerns. Stopped Benzonatate and Bupropion.  Recent consult visits: 03/27/22 Patient presented for a colonoscopy.  02/26/22 Telephone encounter (cardiology): Increased carvedilol to 25 mg BID.  12/25/21 Kozlow, Donnamarie Poag, MD - Patient presented for Moderate persistent well controlled asthma and other concerns. Changed Fluticasone  01/16/21 Lyman Bishop, MD (cardiology): Patient presented for HLD and PVCs. Recommended increasing rosuvastatin to every other day.  Hospital visits: Medication  Reconciliation was completed by comparing discharge summary, patient's EMR and Pharmacy list, and upon discussion with patient.   Patient presented to [facility] on  10/08/21 due to Essential hypertension.     New?Medications Started at [facility] Discharge:?? -started  Amlodipine 5 mg   Medication Changes at Hospital Discharge: -Changed  none   Medications Discontinued at Hospital Discharge: -Stopped  none   Medications that remain the same after Hospital Discharge:??  -All other medications will remain the same.        Objective:  Lab Results  Component Value Date   CREATININE 0.90 05/02/2021   BUN 13 05/02/2021   GFR 64.86 05/02/2021   GFRNONAA 62 06/03/2018   GFRAA 72 06/03/2018   NA 141 05/02/2021   K 4.1 05/02/2021   CALCIUM 9.9 05/02/2021   CO2 27 05/02/2021    Lab Results  Component Value Date/Time   HGBA1C 6.3 (A) 11/06/2021 10:32 AM   HGBA1C 6.6 (H) 05/02/2021 10:12 AM   HGBA1C 5.7 (A) 09/28/2020 09:49 AM   HGBA1C 6.5 03/02/2020 07:32 AM   GFR 64.86 05/02/2021 10:12 AM   GFR 60.50 03/02/2020 07:32 AM    Last diabetic Eye exam:  Lab Results  Component Value Date/Time   HMDIABEYEEXA No Retinopathy 09/03/2021 12:00 AM    Last diabetic Foot exam: No results found for: "HMDIABFOOTEX"   Lab Results  Component Value Date   CHOL 137 06/26/2021   HDL 52 06/26/2021   LDLCALC 66 06/26/2021   LDLDIRECT 143.5 01/15/2011   TRIG 104 06/26/2021   CHOLHDL 2.6 06/26/2021       Latest Ref Rng & Units 05/02/2021   10:12 AM 03/02/2020    7:32 AM 09/24/2019   10:04 AM  Hepatic Function  Total Protein 6.0 - 8.3 g/dL 6.9  6.8  6.6   Albumin 3.5 - 5.2 g/dL 4.3  4.3  4.2   AST 0 - 37 U/L 19  18  16    ALT 0 - 35 U/L 16  17  16    Alk Phosphatase 39 - 117 U/L 81  80  76   Total Bilirubin 0.2 - 1.2 mg/dL 0.6  0.7  0.5     Lab Results  Component Value Date/Time   TSH 5.43 05/02/2021 10:12 AM   TSH 2.79 09/28/2020 10:07 AM   FREET4  0.86 12/08/2018 09:30 AM   FREET4 0.76 05/06/2018 09:27 AM       Latest Ref Rng & Units 05/02/2021   10:12 AM 03/02/2020    7:32 AM 09/24/2019   10:04 AM  CBC  WBC 4.0 - 10.5 K/uL 6.6  6.8  6.8   Hemoglobin 12.0 - 15.0 g/dL 13.8  13.6  13.6   Hematocrit 36.0 - 46.0 % 41.3  40.9  41.9   Platelets 150.0 - 400.0 K/uL 208.0  195.0  226.0     Lab Results  Component Value Date/Time   VD25OH 36 08/17/2009 09:30 PM    Clinical ASCVD: No  The 10-year ASCVD risk score (Arnett DK, et al., 2019) is: 23.7%   Values used to calculate the score:     Age: 71 years     Sex: Female     Is Non-Hispanic African American: No     Diabetic: Yes     Tobacco smoker: No     Systolic Blood Pressure: 280 mmHg     Is BP treated: Yes     HDL Cholesterol: 52 mg/dL     Total Cholesterol: 137 mg/dL       04/09/2022   10:01 AM 09/26/2021    4:02 PM 06/05/2021    9:52 AM  Depression screen PHQ 2/9  Decreased Interest 0 0 0  Down, Depressed, Hopeless 0 0 0  PHQ - 2 Score 0 0 0  Altered sleeping  0   Tired, decreased energy  0   Change in appetite  0   Feeling bad or failure about yourself   0   Trouble concentrating  0   Moving slowly or fidgety/restless  0   Suicidal thoughts  0   PHQ-9 Score  0       Social History   Tobacco Use  Smoking Status Never  Smokeless Tobacco Never   BP Readings from Last 3 Encounters:  04/09/22 129/64  03/27/22 (!) 103/40  12/25/21 122/70   Pulse Readings from Last 3 Encounters:  03/27/22 61  12/25/21 61  11/06/21 (!) 59   Wt Readings from Last 3 Encounters:  04/09/22 197 lb (89.4 kg)  03/27/22 195 lb (88.5 kg)  03/06/22 195 lb (88.5 kg)    Assessment/Interventions: Review of patient past medical history, allergies, medications, health status, including review of consultants reports, laboratory and other test data, was performed as part of comprehensive evaluation and provision of chronic care management services.   SDOH:  (Social Determinants of  Health) assessments and interventions performed: No   CCM Care Plan  Allergies  Allergen Reactions   Codeine Phosphate Nausea And Vomiting    REACTION: unspecified   Prednisone Shortness Of Breath   Betadine [Povidone Iodine] Hives    After surgical skin prep   Sulfa Antibiotics Nausea And Vomiting   Iodine Rash    Broke out on skin once years  ago--**Betadine Skin Prep**    Medications Reviewed Today     Reviewed by Viona Gilmore, [facility] (Pharmacist) on 05/01/22 at 1054  Med List Status: <None>   Medication Order Taking? Sig Documenting Provider Last Dose Status Informant  albuterol (VENTOLIN HFA) 108 (90 Base) MCG/ACT inhaler [id number] Yes INHALE 1-2 PUFFS into lings EVERY 4 HOURS AS NEEDED Kozlow, Donnamarie Poag, MD Taking Active   amLODipine (NORVASC) 5 MG tablet [id number]  Take 1 tablet (5 mg total) by mouth daily. Pixie Casino, MD  Active   budesonide (RHINOCORT AQUA) 32 MCG/ACT nasal spray [id number]  Place 1 spray into both nostrils daily as needed for allergies.  [provider]  Active Self  carvedilol (COREG) 25 MG tablet [id number] Yes TAKE ONE TABLET BY MOUTH EVERY MORNING and TAKE ONE TABLET BY MOUTH EVERY EVENING Hilty, Nadean Corwin, MD Taking Active   Cholecalciferol (VITAMIN D) 50 MCG (2000 UT) tablet [id number]  Take 2,000 Units by mouth daily.  [provider]  Active Self  citalopram (CELEXA) 10 MG tablet [id number]  TAKE 1/2 TABLET BY MOUTH EVERY MORNING Nafziger, Tommi Rumps, NP  Active   Continuous Blood Gluc Sensor (FREESTYLE LIBRE 2 SENSOR) [place] [id number]  1 Device by Does not apply route every 14 (fourteen) days. Nafziger, Tommi Rumps, NP  Active   cycloSPORINE (RESTASIS) 0.05 % ophthalmic emulsion [id number]  Place 1 drop into both eyes 2 (two) times daily. [provider]  Active Self  DOTTI 0.05 MG/24HR patch [id number]  PLACE ONE PATCH onto THE SKIN TWICE WEEKLY Nafziger, Tommi Rumps, NP  Active   famotidine (PEPCID) 20 MG tablet [id number]  Take 1 tablet (20 mg  total) by mouth every evening. Kozlow, Donnamarie Poag, MD  Active   Fluticasone-Umeclidin-Vilant (TRELEGY ELLIPTA) 200-62.5-25 MCG/ACT AEPB [id number]  INHALE 1 PUFF BY MOUTH INTO LUNGS DAILY Kozlow, Donnamarie Poag, MD  Active   hydrochlorothiazide (HYDRODIURIL) 25 MG tablet [id number] Yes TAKE ONE TABLET BY MOUTH EVERY MORNING Nafziger, Tommi Rumps, NP Taking Active   irbesartan (AVAPRO) 150 MG tablet [id number] Yes Take 1 tablet (150 mg total) by mouth 2 (two) times daily. Pixie Casino, MD Taking Active   levothyroxine (SYNTHROID) 50 MCG tablet [id number]  TAKE ONE TABLET BY MOUTH EVERY EVENING  Patient taking differently: TAKE ONE TABLET BY MOUTH EVERY MORNING   Nafziger, Tommi Rumps, NP  Active   metFORMIN (GLUCOPHAGE) 500 MG tablet [id number]  Take 0.5 tablets (250 mg total) by mouth daily with breakfast. [name], NP  Expired 03/06/22 2359   minoxidil (ROGAINE) 2 % external solution [id number]  Apply topically daily. [provider]  Active   Multiple Vitamins-Minerals (ALIVE WOMENS GUMMY PO) [id number]  Take 1 tablet by mouth daily. [provider]  Active   multivitamin-lutein [facility]) CAPS capsule [id number]  Take 1 capsule by mouth daily. [provider]  Active   ondansetron (ZOFRAN) 4 MG tablet [id number]  Take 1 tablet (4 mg total) by mouth as directed. Irene Shipper, MD  Active   potassium chloride (KLOR-CON) 10 MEQ tablet [id number]  TAKE ONE TABLET BY MOUTH EVERY EVENING Nafziger, Tommi Rumps, NP  Active   rosuvastatin (CRESTOR) 10 MG tablet [id number]  TAKE ONE TABLET BY MOUTH every other evening Nafziger, Tommi Rumps, NP  Active   triamcinolone (NASACORT) 55 MCG/ACT AERO nasal inhaler [id number]  INSTILL 1 SPRAY ONCE DAILY Panosh, Standley Brooking, MD  Active   TURMERIC PO [id number]  Take 500 mg by mouth daily.  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   Essential hypertension 05/25/2007   OSTEOARTHRITIS 05/25/2007   NEPHROLITHIASIS, HX OF 05/25/2007   ARTHRITIS, HX OF 05/25/2007    Immunization History  Administered Date(s) Administered   Influenza Split 09/05/2015   Influenza Whole 09/10/2007, 07/20/2009, 07/27/2011   Influenza, High Dose Seasonal PF 07/24/2019   Influenza,inj,Quad PF,6+ Mos 08/22/2018   Influenza-Unspecified 07/05/2020, 07/25/2021   PFIZER(Purple Top)SARS-COV-2 Vaccination 12/14/2019, 01/04/2020, 09/18/2020   Pneumococcal Conjugate-13 12/10/2016   Pneumococcal Polysaccharide-23 12/16/2017   Td 10/21/2002   Tdap 01/29/2011   Patient reports she was having a lot of swelling with the higher dose of amlodipine but that is much improved now. Patient took her BP prior to medications this morning and it was higher than her normal: 143/83. She has been checking every 3-4 days.  Patient reports her freestyle Elenor Legato has shown her blood sugar going up to 250 and sometimes it'll drop down to 59 so she isn't sure if her sensors are working. She denies symptoms of hypo or hyperglycemia.  Patient is seeing higher readings in the morning. She is eating late at night and should probably cut back on this. Her husband works late and she eats around 76 and going to be around [place].   Conditions to be addressed/monitored:  Hypertension, Hyperlipidemia, Diabetes, Asthma, Hypothyroidism, Depression, and Anxiety  Conditions addressed this visit: Hyperlipidemia,  Diabetes, Depression  Care Plan : Elsie  Updates made by Viona Gilmore, [place] since 05/01/2022 12:00 AM     Problem: Problem: Hypertension, Hyperlipidemia, Diabetes, Asthma, Hypothyroidism and Anxiety      Long-Range Goal: Patient-Specific Goal   Start Date: 12/12/2020  Expected End Date: 12/12/2021  Recent Progress: On track  Priority: High  Note:   Current Barriers:  Unable to independently monitor therapeutic efficacy  Pharmacist Clinical Goal(s):  Patient will verbalize ability to afford treatment regimen achieve adherence to monitoring guidelines and medication adherence to achieve therapeutic efficacy through collaboration with PharmD and provider.   Interventions: 1:1 collaboration with [name], NP regarding development and update of comprehensive plan of care as evidenced by provider attestation and co-signature Inter-disciplinary care team collaboration (see longitudinal plan of care) Comprehensive medication review performed; medication list updated in electronic medical record  Hypertension (BP goal <130/80) -Controlled -Current treatment: Carvedilol 25 mg 1 tablet twice daily with a meal - Appropriate, Effective, Safe, Accessible HCTZ 25 mg 1 tablet daily at lunch - Appropriate, Effective, Safe, Accessible Irbesartan 150 mg 1 tablet in the morning and at night - Appropriate, Effective, Safe, Accessible Amlodipine 5 mg 1 tablet daily - Appropriate, Effective, Safe, Accessible -Medications previously tried: Journalist, newspaper -Current home readings: 126/74 (typically), this morning 143/83 -Current dietary habits: did not discuss -Current exercise habits: not currently exercising -Denies hypotensive/hypertensive symptoms -Educated on Exercise goal of 150 minutes per week; Importance of home blood pressure monitoring; -Counseled to monitor BP at home at least weekly, document, and provide log at  future appointments -Counseled on diet and exercise  extensively Recommended to continue current medication  Hyperlipidemia: (LDL goal < 100) -Controlled -Current treatment: Rosuvastatin 10 mg 1 tablet every other day - Appropriate, Effective, Safe, Accessible -Medications previously tried: none  -Current dietary patterns: patient has not been eating as well lately due to stress -Current exercise habits: not exercising  -Educated on Cholesterol goals;  Importance of limiting foods high in cholesterol; Exercise goal of 150 minutes per week; -Recommended to continue current medication  Pre-diabetes (A1c goal <6.5%) -Controlled -Current medications: No medications -Medications previously tried: metformin  -Current home glucose readings: using CGM -Denies hypoglycemic/hyperglycemic symptoms -Current meal patterns:  breakfast: n/a  lunch: n/a  dinner: n/a snacks: n/a drinks: n/a -Current exercise: not exercising currently -Educated onExercise goal of 150 minutes per week; Carbohydrate counting and/or plate method -Counseled to check feet daily and get yearly eye exams -Counseled on diet and exercise extensively -Mailed healthy plate handout  Asthma (Goal: control symptoms and prevent exacerbations) -Controlled -Current treatment  Albuterol HFA 1-2 puffs into the lungs every 4 hrs as needed - Appropriate, Effective, Safe, Accessible Trelegy Ellipta 1 puff into the lungs daily - Query Appropriate, Effective, Safe, Accessible -Medications previously tried: Arnuity  -Patient reports consistent use of maintenance inhaler -Frequency of rescue inhaler use: not often -Counseled on When to use rescue inhaler -Recommended use of albuterol when she is around dust Assessed patient finances. Patient is not having trouble affording.   Depression/anxiety (Goal: minimize symptoms) -Controlled -Current treatment: Citalopram 10 mg 0.5 tablet daily - Appropriate, Effective, Safe, Accessible -Medications previously tried/failed: Wellbutrin  (nightmares), diazepam, higher dose of citalopram (drowsiness) -PHQ9: 0 -GAD7: n/a -Educated on Benefits of medication for symptom control Benefits of cognitive-behavioral therapy with or without medication -Recommended to continue current medication Patient will be utilizing hospice resources after her mom's death for counseling  Post menopausal (Goal: minimize symptoms ) -Controlled -Current treatment  Dotti 0.05 mg/24 hr patch apply 0.5 patch onto the skin twice a week - Appropriate, Effective, Query Safe, Accessible -Medications previously tried: none  -Counseled on risks of taking estrogen therapy long term such as strokes, blood clots, and cancer  Health Maintenance -Vaccine gaps: shingrix -Current therapy:  Vitamin D 2000 units take 1 tablet daily Restasis 0.05% eye drop 1 drop into both eyes twice daily Famotidine 20 mg 1 tablet daily Turmeric 1 capsule once daily Budesonide 32 mcg/act nasa; spray 1 spray into both nostrils daily as needed Alive Women's Gummy 1 daily  -Educated on Cost vs benefit of each product must be carefully weighed by individual consumer -Patient is satisfied with current therapy and denies issues -Recommended to continue current medication  Patient Goals/Self-Care Activities Patient will:  - take medications as prescribed check blood pressure at least weekly, document, and provide at future appointments target a minimum of 150 minutes of moderate intensity exercise weekly  Follow Up Plan: The care management team will reach out to the patient again over the next 14 days.          Medication Assistance: None required.  Patient affirms current coverage meets needs.  Compliance/Adherence/Medication fill history: Care Gaps: Shingrix, COVID booster, tetanus BP- 122/70 12/25/21 Last A1c: 6.3 (11/06/21)  Star-Rating Drugs: Ibesartan (Avapro) 150 mg - Last filled 03/05/22 90 DS at [facility] Rosuvastatin (Crestor) 10 mg - Last filled 03/05/22 90 DS  at [facility] Metformin (Glucophage) 250 mg - Last filled 03/05/22 90 DS at  [facility]  Patient's preferred pharmacy is:  [facility] -  [place], [place] - [address] [place] [address] Phone: [phone] Fax: [phone]  [facility] [phone] - [place], [address] [facility] [address] AT [address] [place] [address] Phone: [phone] Fax: [phone]   Uses pill box? No - adherence packaging Pt endorses 100% compliance  We discussed: Benefits of medication synchronization, packaging and delivery as well as enhanced pharmacist oversight with [facility]. Patient decided to: Utilize [facility] pharmacy for medication synchronization, packaging and delivery  Care Plan and Follow Up Patient Decision:  Patient agrees to Care Plan and Follow-up.  Plan: The care management team will reach out to the patient again over the next 14 days.  Jeni Salles, PharmD [facility] Clinical Pharmacist [place] [facility] [place]

## 2022-05-01 NOTE — Patient Instructions (Signed)
Hi [place],  It was great to catch up with you again! Don't forget to bring in your blood pressure cuff to your next office visit to make sure it's accurate.  Please reach out to me if you have any questions or need anything!  Best, [name]  Jeni Salles, PharmD, [place] Pharmacist [place] [facility]   Visit Information   Goals Addressed   None    Patient Care Plan: CCM Pharmacy Care Plan     Problem Identified: Problem: Hypertension, Hyperlipidemia, Diabetes, Asthma, Hypothyroidism and Anxiety      Long-Range Goal: Patient-Specific Goal   Start Date: 12/12/2020  Expected End Date: 12/12/2021  Recent Progress: On track  Priority: High  Note:   Current Barriers:  Unable to independently monitor therapeutic efficacy  Pharmacist Clinical Goal(s):  Patient will verbalize ability to afford treatment regimen achieve adherence to monitoring guidelines and medication adherence to achieve therapeutic efficacy through collaboration with PharmD and provider.   Interventions: 1:1 collaboration with [name], NP regarding development and update of comprehensive plan of care as evidenced by provider attestation and co-signature Inter-disciplinary care team collaboration (see longitudinal plan of care) Comprehensive medication review performed; medication list updated in electronic medical record  Hypertension (BP goal <130/80) -Controlled -Current treatment: Carvedilol 25 mg 1 tablet twice daily with a meal - Appropriate, Effective, Safe, Accessible HCTZ 25 mg 1 tablet daily at lunch - Appropriate, Effective, Safe, Accessible Irbesartan 150 mg 1 tablet in the morning and at night - Appropriate, Effective, Safe, Accessible Amlodipine 5 mg 1 tablet daily - Appropriate, Effective, Safe, Accessible -Medications previously tried: Journalist, newspaper -Current home readings: 126/74 (typically), this morning 143/83 -Current dietary habits: did not  discuss -Current exercise habits: not currently exercising -Denies hypotensive/hypertensive symptoms -Educated on Exercise goal of 150 minutes per week; Importance of home blood pressure monitoring; -Counseled to monitor BP at home at least weekly, document, and provide log at future appointments -Counseled on diet and exercise extensively Recommended to continue current medication  Hyperlipidemia: (LDL goal < 100) -Controlled -Current treatment: Rosuvastatin 10 mg 1 tablet every other day - Appropriate, Effective, Safe, Accessible -Medications previously tried: none  -Current dietary patterns: patient has not been eating as well lately due to stress -Current exercise habits: not exercising  -Educated on Cholesterol goals;  Importance of limiting foods high in cholesterol; Exercise goal of 150 minutes per week; -Recommended to continue current medication  Pre-diabetes (A1c goal <6.5%) -Controlled -Current medications: No medications -Medications previously tried: metformin  -Current home glucose readings: using CGM -Denies hypoglycemic/hyperglycemic symptoms -Current meal patterns:  breakfast: n/a  lunch: n/a  dinner: n/a snacks: n/a drinks: n/a -Current exercise: not exercising currently -Educated onExercise goal of 150 minutes per week; Carbohydrate counting and/or plate method -Counseled to check feet daily and get yearly eye exams -Counseled on diet and exercise extensively -Mailed healthy plate handout  Asthma (Goal: control symptoms and prevent exacerbations) -Controlled -Current treatment  Albuterol HFA 1-2 puffs into the lungs every 4 hrs as needed - Appropriate, Effective, Safe, Accessible Trelegy Ellipta 1 puff into the lungs daily - Query Appropriate, Effective, Safe, Accessible -Medications previously tried: Arnuity  -Patient reports consistent use of maintenance inhaler -Frequency of rescue inhaler use: not often -Counseled on When to use rescue  inhaler -Recommended use of albuterol when she is around dust Assessed patient finances. Patient is not having trouble affording.   Depression/anxiety (Goal: minimize symptoms) -Controlled -Current treatment: Citalopram 10 mg 0.5 tablet daily - Appropriate, Effective, Safe,  Accessible -Medications previously tried/failed: Wellbutrin (nightmares), diazepam, higher dose of citalopram (drowsiness) -PHQ9: 0 -GAD7: n/a -Educated on Benefits of medication for symptom control Benefits of cognitive-behavioral therapy with or without medication -Recommended to continue current medication Patient will be utilizing hospice resources after her mom's death for counseling  Post menopausal (Goal: minimize symptoms ) -Controlled -Current treatment  Dotti 0.05 mg/24 hr patch apply 0.5 patch onto the skin twice a week - Appropriate, Effective, Query Safe, Accessible -Medications previously tried: none  -Counseled on risks of taking estrogen therapy long term such as strokes, blood clots, and cancer  Health Maintenance -Vaccine gaps: shingrix -Current therapy:  Vitamin D 2000 units take 1 tablet daily Restasis 0.05% eye drop 1 drop into both eyes twice daily Famotidine 20 mg 1 tablet daily Turmeric 1 capsule once daily Budesonide 32 mcg/act nasa; spray 1 spray into both nostrils daily as needed Alive Women's Gummy 1 daily  -Educated on Cost vs benefit of each product must be carefully weighed by individual consumer -Patient is satisfied with current therapy and denies issues -Recommended to continue current medication  Patient Goals/Self-Care Activities Patient will:  - take medications as prescribed check blood pressure at least weekly, document, and provide at future appointments target a minimum of 150 minutes of moderate intensity exercise weekly  Follow Up Plan: The care management team will reach out to the patient again over the next 14 days.         Patient verbalizes  understanding of instructions and care plan provided today and agrees to view in [place]. Active MyChart status and patient understanding of how to access instructions and care plan via MyChart confirmed with patient.    The pharmacy team will reach out to the patient again over the next 7 days.   Viona Gilmore, [facility]

## 2022-05-15 ENCOUNTER — Encounter: Payer: Self-pay | Admitting: Adult Health

## 2022-05-15 ENCOUNTER — Ambulatory Visit (INDEPENDENT_AMBULATORY_CARE_PROVIDER_SITE_OTHER): Payer: PPO | Admitting: Adult Health

## 2022-05-15 VITALS — BP 130/80 | HR 59 | Temp 97.9°F | Ht 63.75 in | Wt 201.0 lb

## 2022-05-15 DIAGNOSIS — N951 Menopausal and female climacteric states: Secondary | ICD-10-CM

## 2022-05-15 DIAGNOSIS — E039 Hypothyroidism, unspecified: Secondary | ICD-10-CM | POA: Diagnosis not present

## 2022-05-15 DIAGNOSIS — E785 Hyperlipidemia, unspecified: Secondary | ICD-10-CM

## 2022-05-15 DIAGNOSIS — J4520 Mild intermittent asthma, uncomplicated: Secondary | ICD-10-CM

## 2022-05-15 DIAGNOSIS — R7303 Prediabetes: Secondary | ICD-10-CM

## 2022-05-15 DIAGNOSIS — F419 Anxiety disorder, unspecified: Secondary | ICD-10-CM

## 2022-05-15 DIAGNOSIS — Z0001 Encounter for general adult medical examination with abnormal findings: Secondary | ICD-10-CM | POA: Diagnosis not present

## 2022-05-15 DIAGNOSIS — I10 Essential (primary) hypertension: Secondary | ICD-10-CM

## 2022-05-15 DIAGNOSIS — F32A Depression, unspecified: Secondary | ICD-10-CM

## 2022-05-15 LAB — HEMOGLOBIN A1C: Hgb A1c MFr Bld: 6.9 % — ABNORMAL HIGH (ref 4.6–6.5)

## 2022-05-15 LAB — CBC WITH DIFFERENTIAL/PLATELET
Basophils Absolute: 0.1 10*3/uL (ref 0.0–0.1)
Basophils Relative: 1.1 % (ref 0.0–3.0)
Eosinophils Absolute: 0.2 10*3/uL (ref 0.0–0.7)
Eosinophils Relative: 3.3 % (ref 0.0–5.0)
HCT: 40.9 % (ref 36.0–46.0)
Hemoglobin: 13.5 g/dL (ref 12.0–15.0)
Lymphocytes Relative: 19.1 % (ref 12.0–46.0)
Lymphs Abs: 1.2 10*3/uL (ref 0.7–4.0)
MCHC: 33.1 g/dL (ref 30.0–36.0)
MCV: 90.1 fl (ref 78.0–100.0)
Monocytes Absolute: 0.8 10*3/uL (ref 0.1–1.0)
Monocytes Relative: 12.6 % — ABNORMAL HIGH (ref 3.0–12.0)
Neutro Abs: 4 10*3/uL (ref 1.4–7.7)
Neutrophils Relative %: 63.9 % (ref 43.0–77.0)
Platelets: 204 10*3/uL (ref 150.0–400.0)
RBC: 4.54 Mil/uL (ref 3.87–5.11)
RDW: 13.7 % (ref 11.5–15.5)
WBC: 6.2 10*3/uL (ref 4.0–10.5)

## 2022-05-15 LAB — LIPID PANEL
Cholesterol: 176 mg/dL (ref 0–200)
HDL: 63.1 mg/dL (ref >39.00)
LDL Cholesterol: 99 mg/dL (ref 0–99)
NonHDL: 113.26
Total CHOL/HDL Ratio: 3
Triglycerides: 73 mg/dL (ref 0.0–149.0)
VLDL: 14.6 mg/dL (ref 0.0–40.0)

## 2022-05-15 LAB — COMPREHENSIVE METABOLIC PANEL
ALT: 22 U/L (ref 0–35)
AST: 22 U/L (ref 0–37)
Albumin: 4.4 g/dL (ref 3.5–5.2)
Alkaline Phosphatase: 94 U/L (ref 39–117)
BUN: 19 mg/dL (ref 6–23)
CO2: 27 mEq/L (ref 19–32)
Calcium: 10 mg/dL (ref 8.4–10.5)
Chloride: 102 mEq/L (ref 96–112)
Creatinine, Ser: 0.89 mg/dL (ref 0.40–1.20)
GFR: 65.26 mL/min (ref >60.00)
Glucose, Bld: 128 mg/dL — ABNORMAL HIGH (ref 70–99)
Potassium: 3.7 mEq/L (ref 3.5–5.1)
Sodium: 138 mEq/L (ref 135–145)
Total Bilirubin: 0.7 mg/dL (ref 0.2–1.2)
Total Protein: 7.6 g/dL (ref 6.0–8.3)

## 2022-05-15 LAB — TSH: TSH: 4.12 u[IU]/mL (ref 0.35–5.50)
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
## 2022-05-15 NOTE — Patient Instructions (Addendum)
It was great seeing you today   We will follow up with you regarding your lab work   Please let me know if you need anything   

## 2022-05-16 ENCOUNTER — Other Ambulatory Visit: Payer: Self-pay

## 2022-05-16 DIAGNOSIS — R7303 Prediabetes: Secondary | ICD-10-CM

## 2022-05-16 MED ORDER — METFORMIN HCL 500 MG PO TABS: 500.0000 mg | ORAL_TABLET | Freq: Two times a day (BID) | ORAL | 1 refills | Status: DC

## 2022-05-20 DIAGNOSIS — F321 Major depressive disorder, single episode, moderate: Secondary | ICD-10-CM

## 2022-05-20 DIAGNOSIS — I10 Essential (primary) hypertension: Secondary | ICD-10-CM

## 2022-05-27 ENCOUNTER — Other Ambulatory Visit: Payer: Self-pay | Admitting: Adult Health

## 2022-05-27 ENCOUNTER — Telehealth: Payer: Self-pay | Admitting: Pharmacist

## 2022-05-27 ENCOUNTER — Other Ambulatory Visit: Payer: Self-pay | Admitting: Internal Medicine

## 2022-05-27 ENCOUNTER — Telehealth: Payer: Self-pay | Admitting: Internal Medicine

## 2022-05-27 NOTE — Telephone Encounter (Signed)
REFILL SENT 

## 2022-05-27 NOTE — Chronic Care Management (AMB) (Unsigned)
Chronic Care Management Pharmacy Assistant   Name: Amber Brewer  MRN: [id number] DOB: 1951/03/10  Reason for Encounter: Medication Review Medication Coordination    Recent office visits:  05/15/22 Amber Peng, NP - Patient presented for General Adult medical examination with abnormal findings and other concerns. No medication changes.  Recent consult visits:  None  Hospital visits:  None in previous 6 months  Medications: Outpatient Encounter Medications as of 05/27/2022  Medication Sig   albuterol (VENTOLIN HFA) 108 (90 Base) MCG/ACT inhaler INHALE 1-2 PUFFS into lings EVERY 4 HOURS AS NEEDED   amLODipine (NORVASC) 5 MG tablet Take 1 tablet (5 mg total) by mouth daily.   blood glucose meter kit and supplies Dispense based on patient and insurance preference. Use up to four times daily as directed. (FOR ICD-10 E10.9, E11.9).   budesonide (RHINOCORT AQUA) 32 MCG/ACT nasal spray Place 1 spray into both nostrils daily as needed for allergies.    carvedilol (COREG) 25 MG tablet TAKE ONE TABLET BY MOUTH EVERY MORNING and TAKE ONE TABLET BY MOUTH EVERY EVENING   Cholecalciferol (VITAMIN D) 50 MCG (2000 UT) tablet Take 2,000 Units by mouth daily.    citalopram (CELEXA) 10 MG tablet TAKE 1/2 TABLET BY MOUTH EVERY MORNING   Continuous Blood Gluc Sensor (FREESTYLE LIBRE 2 SENSOR) MISC 1 Device by Does not apply route every 14 (fourteen) days.   cycloSPORINE (RESTASIS) 0.05 % ophthalmic emulsion Place 1 drop into both eyes 2 (two) times daily.   DOTTI 0.05 MG/24HR patch PLACE ONE PATCH onto THE SKIN TWICE WEEKLY   famotidine (PEPCID) 20 MG tablet Take 1 tablet (20 mg total) by mouth every evening.   Fluticasone-Umeclidin-Vilant (TRELEGY ELLIPTA) 200-62.5-25 MCG/ACT AEPB INHALE 1 PUFF BY MOUTH INTO LUNGS DAILY   hydrochlorothiazide (HYDRODIURIL) 25 MG tablet TAKE ONE TABLET BY MOUTH EVERY MORNING   irbesartan (AVAPRO) 150 MG tablet Take 1 tablet (150 mg total) by mouth 2 (two) times daily.    levothyroxine (SYNTHROID) 50 MCG tablet TAKE ONE TABLET BY MOUTH EVERY EVENING (Patient taking differently: TAKE ONE TABLET BY MOUTH EVERY MORNING)   metFORMIN (GLUCOPHAGE) 500 MG tablet Take 1 tablet (500 mg total) by mouth 2 (two) times daily with a meal.   minoxidil (ROGAINE) 2 % external solution Apply topically daily.   Multiple Vitamins-Minerals (ALIVE WOMENS GUMMY PO) Take 1 tablet by mouth daily.   multivitamin-lutein (OCUVITE-LUTEIN) CAPS capsule Take 1 capsule by mouth daily.   ondansetron (ZOFRAN) 4 MG tablet Take 1 tablet (4 mg total) by mouth as directed.   potassium chloride (KLOR-CON) 10 MEQ tablet TAKE ONE TABLET BY MOUTH EVERY EVENING   rosuvastatin (CRESTOR) 10 MG tablet TAKE ONE TABLET BY MOUTH every other evening   triamcinolone (NASACORT) 55 MCG/ACT AERO nasal inhaler INSTILL 1 SPRAY ONCE DAILY   TURMERIC PO Take 500 mg by mouth daily.    No facility-administered encounter medications on file as of 05/27/2022.   Reviewed chart for medication changes ahead of medication coordination call.   BP Readings from Last 3 Encounters:  05/15/22 130/80  04/09/22 129/64  03/27/22 (!) 103/40    Lab Results  Component Value Date   HGBA1C 6.9 (H) 05/15/2022     Patient obtains medications through Adherence Packaging  90 Days   Last adherence delivery included:  Amlodipine (Norvasc) 5 mg: take one tablet at  breakfast Freestyle Libre 2 Sensors: change every 14 days  Citalopram (celexa) 10 mg:  tablet daily at breakfast   Levothyroxine (  synthroid) 50 MCG: one tablet at breakfast Potassium Chloride (Klor-Con) 10 MEQ: one tablet daily at dinner Famotidine (pepcid) 20 mg: one tablet daily at bedtime Vitamin D3 2000 units daily: one tablet daily at dinner [place] Adult Multivitamin 50+ 1 capsule daily: one at breakfast Turmeric 500 mg: one tablet daily at dinner Hydrochlorothiazide 25 MG tablet: one tablet at breakfast Rosuvastatin 10 mg: one tablet every other day  at dinner Irbesartan (avapro)150 mg: Take 1 tablet by mouth at breakfast and at dinner Metformin 500 mg mg 1/2 tablet daily with breakfast Carvedilol (Coreg) 25 mg: one tablet twice daily at breakfast and dinner     Notes: Patient reports she has gone back up to 10 mg for her amlodipine as the 5 mg was not controlling her blood pressure well, she reports she is still also having the swelling even with the use of compression stockings, elevating when she can and trying to monitor and limit her use of salts would like to know what else she can do to help the swelling. Call to Dr [facility] office to request an updated prescription for 10 mg be sent over to the pharmacy as well as passed along her questions above to the office for a return call to her.   Refer to telephone encounter - Dr. Debara Pickett did not want to increase amlodipine and increased carvedilol instead to 25 mg BID.   Patient declined the following medications due to abundance on hand Estradiol (Vivelle-Dot) 0.05 mg 24 hr patch: one patch onto skin twice a week  Trelegy Ellipta: Inhale 1 puff by mouth into lungs daily ( 3 on hand) Albuterol (PROAIR HFA) 108 (90 Base) MCG/ACT inhaler    Patient is due for next adherence delivery on: 06/07/22. Called patient and reviewed medications and coordinated delivery. Packs 90 DS  This delivery to include:  Amlodipine (Norvasc) 5 mg: take one tablet at  breakfast Freestyle Libre 2 Sensors: change every 14 days  Citalopram (celexa) 10 mg:  tablet daily at breakfast   Levothyroxine (synthroid) 50 MCG: one tablet at breakfast Potassium Chloride (Klor-Con) 10 MEQ: one tablet daily at dinner Famotidine (pepcid) 20 mg: one tablet daily at bedtime Vitamin D3 2000 units daily: one tablet daily at dinner [place] Adult Multivitamin 50+ 1 capsule daily: one at breakfast Turmeric 500 mg: one tablet daily at dinner Hydrochlorothiazide 25 MG tablet: one tablet at breakfast Rosuvastatin 10 mg:  one tablet every other day at dinner Irbesartan (avapro)150 mg: Take 1 tablet by mouth at breakfast and at dinner Metformin 500 mg mg 1 tablet daily with breakfast and 1 at dinner Carvedilol (Coreg) 25 mg: one tablet twice daily at breakfast and dinner   Albuterol (PROAIR HFA) 108 (90 Base) MCG/ACT inhaler Estradiol (Vivelle-Dot) 0.05 mg 24 hr patch: one patch onto skin twice a week  Trelegy Ellipta: Inhale 1 puff by mouth into lungs daily  Strips/lancets..hold?   Patient will need a short fill of (med), prior to adherence delivery. (To align with sync date or if PRN med)  Coordinated acute fill for (med) to be delivered (date).  Patient declined the following medications (meds) due to (reason)  Patient needs refills for ***.  Confirmed delivery date of ***, advised patient that pharmacy will contact them the morning of delivery.   Care Gaps: Flu Vaccine - Overdue COVID Booster - Postponed Zoster Vaccine - Postponed TDAP - Postponed BP- 130/80 05/15/22 AWV- 6/23  Lab Results  Component Value Date   HGBA1C 6.9 (  H) 05/15/2022     Star Rating Drugs: Ibesartan (Avapro) 150 mg - Last filled 03/05/22 90 DS at [facility] Rosuvastatin (Crestor) 10 mg - Last filled 03/05/22 90 DS at [facility] Metformin (Glucophage) 500 mg - Last filled 03/05/22 90 DS at  [facility] Pharmacist Assistant [phone]

## 2022-05-27 NOTE — Telephone Encounter (Signed)
MUST KEEP APPT FOR FUTURE REFILLS

## 2022-05-27 NOTE — Telephone Encounter (Signed)
*  STAT* If patient is at the pharmacy, call can be transferred to refill team.   1. Which medications need to be refilled? (please list name of each medication and dose if known)  amLODipine (NORVASC) 5 MG tablet irbesartan (AVAPRO) 150 MG tablet carvedilol (COREG) 25 MG tablet  2. Which pharmacy/location (including street and city if local pharmacy) is medication to be sent to? [facility] - [place], [place] - [place] [address]  3. Do they need a 30 day or 90 day supply? 90 day

## 2022-05-28 ENCOUNTER — Telehealth: Payer: Self-pay | Admitting: Internal Medicine

## 2022-05-28 MED ORDER — IRBESARTAN 150 MG PO TABS: 300.0000 mg | ORAL_TABLET | Freq: Every day | ORAL | 0 refills | Status: DC

## 2022-05-28 MED ORDER — CARVEDILOL 25 MG PO TABS: ORAL_TABLET | ORAL | 0 refills | Status: DC

## 2022-05-28 MED ORDER — AMLODIPINE BESYLATE 5 MG PO TABS: 5.0000 mg | ORAL_TABLET | Freq: Every day | ORAL | 0 refills | Status: DC

## 2022-05-28 NOTE — Telephone Encounter (Signed)
Last OV 09/2021 with Dr. Debara Pickett  Upcoming visit 8/14  New rx sent for 90 day supply to [facility]

## 2022-05-28 NOTE — Telephone Encounter (Signed)
[facility] called stating her refills were sent for odd qualities yesterday.  They requested 90 day supplies of amLODipine (NORVASC) 5 MG tablet irbesartan (AVAPRO) 150 MG tablet, and carvedilol (COREG) 25 MG tablet

## 2022-06-03 ENCOUNTER — Ambulatory Visit: Payer: PPO | Admitting: Internal Medicine

## 2022-06-03 ENCOUNTER — Encounter: Payer: Self-pay | Admitting: Internal Medicine

## 2022-06-03 VITALS — BP 124/78 | HR 52 | Ht 63.75 in | Wt 200.8 lb

## 2022-06-03 DIAGNOSIS — I493 Ventricular premature depolarization: Secondary | ICD-10-CM | POA: Diagnosis not present

## 2022-06-03 DIAGNOSIS — E782 Mixed hyperlipidemia: Secondary | ICD-10-CM | POA: Diagnosis not present

## 2022-06-03 DIAGNOSIS — I10 Essential (primary) hypertension: Secondary | ICD-10-CM | POA: Diagnosis not present

## 2022-06-03 NOTE — Patient Instructions (Signed)
Medication Instructions:  The current medical regimen is effective;  continue present plan and medications.  *If you need a refill on your cardiac medications before your next appointment, please call your pharmacy*   Follow-Up: At [facility], you and your health needs are our priority.  As part of our continuing mission to provide you with exceptional heart care, we have created designated Provider Care Teams.  These Care Teams include your primary Cardiologist (physician) and Advanced Practice Providers (APPs -  Physician Assistants and Nurse Practitioners) who all work together to provide you with the care you need, when you need it.  We recommend signing up for the patient portal called "MyChart".  Sign up information is provided on this After Visit Summary.  MyChart is used to connect with patients for Virtual Visits (Telemedicine).  Patients are able to view lab/test results, encounter notes, upcoming appointments, etc.  Non-urgent messages can be sent to your provider as well.   To learn more about what you can do with MyChart, go to [email or web address].    Your next appointment:   12 month(s)  The format for your next appointment:   In Person  Provider:   Dr.Hilty

## 2022-06-03 NOTE — Progress Notes (Signed)
OFFICE NOTE  Chief Complaint:  Follow-up  Primary Care Physician: [name], NP  HPI:  Amber Brewer is a 71 y.o. female with a past medical history significant for hypertension, dyslipidemia, asthma/allergies, GERD, Schatzki's ring and prior esophageal dilatation about 5 years ago.  She now presents with recent episodes of squeezing in the chest which feel episodic and spasmodic. She is concerned that this may be esophageal spasm however her primary care provider wanted to rule out possible coronary disease. She feels a tightness in her chest that is not necessarily associated with eating or present with exertion or relieved by rest. She is also short of breath, particularly when walking up hills. The tightness is been coming on more recently. She's also been under significant stress and does have some history of anxiety. She is caring for disabled son and has an 69 year old mother who she is caring for. 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 She reports that she does have some mild trouble swallowing and occasionally food does get stuck in her mid throat and often times pills are difficult to swallow and may come up. She also reported a brief presyncopal episode however this is with significantly rotating her head about 90 the cause that quick decrease in vision which improved when straightening her head out. She does have a history of neck injury in the past.  02/26/2016  Amber Brewer returns today for follow-up of her nuclear stress test. This is an exercise study in which no ischemia was noted. LVEF was greater than 65%. She did have a hypertensive response to exercise with a peak systolic blood pressure 765. Blood pressure appears that is not as well-controlled as it should be. We discussed her chest discomfort which she says she is had some improvement in. Given her prior esophageal dilatation history and Schatzki's ring, she may be having esophageal  spasm. I asked her to follow-up with her gastroenterologist about this. She may need manometry. If this is the case then she may benefit from addition of low-dose calcium channel blocker. She does not want to add any extra medication at this time, however blood pressure is suboptimally controlled.  05/27/2016  Amber Brewer returns today for follow-up. She continues to have some anxiety episodes. This is associated with some throat tightness. She also has a history of esophageal stricture and Schatzki's ring. She has made an appointment with her gastroenterologist but that is not until September. It's unclear whether these episodes are GI related or anxiety. I recently increased her Bystolic to 7.5 mg daily. Her home blood pressure readings indicate good control.  11/08/2015  Amber Brewer was seen today as an add-on for feeling faint and having palpitations. An EKG in the office shows frequent unifocal PVCs. This is symptomatically similar to what she had when I saw her back in August however she is having more frequent episodes. She is also under significant stress. Her husband recently had intracranial hemorrhage and had emergency brain surgery. She also takes care of her elderly mother and disabled child. This causes a lot of stress in her life. She's had asthma however she is on inhaled steroid and rarely uses a beta agonist. She is on Bystolic however that has very poor ability to block PVCs. LV function was normal this past year in April 2017 by stress test without any ischemia, therefore not concerned about these being ischemic PVCs.  11/11/2016  Amber Brewer returns today for follow-up. She reports  symptomatic improvement in her palpitations on carvedilol. Blood pressure is low normal. She was getting a little dizzy and move her blood pressure medicines to the middle of the day. She reports about 50% improvement in her palpitations. She says she is breathing better and has more  energy.  12/19/2016  Amber Brewer is seen today in follow-up. Overall she feels much better. She is actually made adjustments or other medication at home. She felt like a higher dose of carvedilol was robbing her of energy. Not quite as bad as being on diastolic but certainly worse. She therefore decreased the dose and currently is only taking 12.5 mg every morning. She subsequently increase the dose of her irbesartan up to 300 mg daily. Blood pressure control is been excellent with most readings between 093 and 818 systolic. She denies any worsening palpitations. She is generally pleased with how she feels on this current medication regimen. I did report to her that carvedilol is typically twice daily medicine because of the short half-life and we discussed the possibility of dividing the dose twice daily.  06/17/2017  Amber Brewer is seen today in follow-up. She reports improvement in her palpitations on carvedilol however they're not as well-controlled as they have been in the past. She's previously been on an increased dose of the carvedilol and felt that it was robbing her energy. Is difficult to find a balance between the 2 medications. She's been struggling with vestibular problems and is undergoing neuro physical therapy. She says this is been helpful for her.  01/16/2021  Amber Brewer returns today for follow-up.  I saw her last via telemedicine visit.  I had recommended increasing her irbesartan to 300 mg daily.  She notes that at home her blood pressures generally in the 299B 716 systolic range although was more elevated today 142/73.  She also has lost a significant amount of weight.  Some of this was intentional and some of it not because she had COVID-19 in January and she was taking care of her mother who ultimately died of that.  Her cholesterol however is come down substantially.  Total was 154, triglycerides 67 HDL 56 and LDL of 73, this is down from 171 previously.  She was taken the  rosuvastatin 10 mg daily but was advised by her PCP to drop that back to once weekly.  I do think we could cut back on it however I would be hesitant to say that this was all from diet and it will likely increase with a significant reduction in her rosuvastatin.   07/03/2021  Amber Brewer is seen today in follow-up.  Overall she says she feels very well.  She does not have any significant palpitations in some time.  EKG today shows no PVCs.  Weight is unfortunately crept up some as is her blood pressure.  Today it was initially elevated however recheck came down to 140/78.  She said this is more in line with her home readings.  Cholesterol however just recently checked is still pretty good with total 137, HDL 52, triglycerides 104 and LDL 66.  Her A1c was 5.4 and has come up to 6.6 as of July.  This has been associated with weight gain.  This past year has been difficult with several losses in her family including her mother and she contracted COVID-71.  She is now just starting to feel that she could become more active and work on weight loss.  10/17/2021  Amber Brewer seen today in follow-up.  Recently she has had some issues with hypertension.  She had some systolic blood pressures over 200 and was referred to the emergency department.  This required 2 ER visits and ultimately she was placed on higher dose amlodipine as she cannot get an appointment with our office.  She says that she has been on the higher dose amlodipine she seems to be tolerating this pretty well.  Its been about a week on that medication.  She has developed some lower extremity edema by the end of the day but is tolerable.  Blood pressure today in the office was 127/72.  I have encouraged her to continue to follow this as the medication should be kicking in and around this point.  06/03/2022  Amber Brewer is seen today in follow-up.  She called our office back in March of this year having had some palpitations.  They were occurring a  little more frequently.  She thought it might be related to a change in the amlodipine.  I did not feel that it was related but it may be coincidental.  Since then she says they have improved quite a bit.  They happen fairly infrequently.  She does have a history of PVCs in the past and I suspect that this.  She says it only lasts a few seconds and may be only a few times a week.  Blood pressure is now much better controlled.  After decreasing amlodipine her edema has improved significantly.  She is quite happy with her current blood pressure control today at 124/78.  Recent lipids and July showed total cholesterol 176, HDL 63, triglycerides 73 and LDL 99.  She denies any chest pain or shortness of breath.  PMHx:  Past Medical History:  Diagnosis Date   Allergy    Anemia    age 7 only   Asthma    Bowen's disease    ~ 47 yrs ago   Chicken pox    DDD (degenerative disc disease), cervical    Depression    Diabetes mellitus without complication ([place])    on metformin - goes back andf forth   Gallstones    GERD (gastroesophageal reflux disease)    Hx of melanoma in situ 2002   Hx: UTI (urinary tract infection)    Hyperlipidemia    Hypertension    Hypothyroidism    IBS (irritable bowel syndrome)    with D   Melanoma (HCC)    x2    Nephrolithiasis    Osteoarthritis    PVC's (premature ventricular contractions)    Status post dilation of esophageal narrowing     Past Surgical History:  Procedure Laterality Date   ABDOMINAL HYSTERECTOMY  1974   APPENDECTOMY  1977   BLADDER SURGERY  1974   BREAST EXCISIONAL BIOPSY     CATARACT EXTRACTION Bilateral 2015   CHOLECYSTECTOMY  1987   COLONOSCOPY     RECTAL SURGERY     ~17 yrs ago- [facility]ens dx per pt with surgery   removal of melanoma in situ      FAMHx:  Family History  Problem Relation Age of Onset   Hypertension Mother    Thyroid disease Mother    Arthritis Mother    Depression Mother    Hearing loss Mother    Miscarriages /  Stillbirths Mother    Heart attack Father    Heart disease Father    Alcohol abuse Father    Arthritis Father    Depression Father    Drug  abuse Father    Hypertension Father    Alcohol abuse Sister    Arthritis Sister    Depression Sister    Hearing loss Sister    Hypotension Sister    Depression Sister    Heart disease Sister    Hyperlipidemia Sister    Hypertension Maternal Aunt    Hypertension Maternal Uncle    Clotting disorder Maternal Uncle    Prostate cancer Maternal Uncle    Colon cancer Maternal Uncle    Colon polyps Maternal Uncle    Diabetes Maternal Uncle        all uncles   Arthritis Maternal Grandmother    Asthma Maternal Grandmother    Breast cancer Maternal Grandmother    Stomach cancer Maternal Grandmother    Hearing loss Maternal Grandmother    Diabetes Maternal Grandfather    Heart disease Maternal Grandfather    Arthritis Maternal Grandfather    Hearing loss Maternal Grandfather    Hypertension Maternal Grandfather    Kidney disease Maternal Grandfather    Hyperlipidemia Paternal Grandmother        also MI   Breast cancer Paternal Grandmother    Arthritis Paternal Grandmother    Heart attack Paternal Grandmother    Hyperlipidemia Paternal Grandfather        also MI   Heart disease Paternal Grandfather    Heart attack Paternal Grandfather    Endometriosis Daughter    Hypertension Son    Esophageal cancer Neg Hx    Rectal cancer Neg Hx     SOCHx:   reports that she has never smoked. She has never used smokeless tobacco. She reports that she does not drink alcohol and does not use drugs.  ALLERGIES:  Allergies  Allergen Reactions   Codeine Phosphate Nausea And Vomiting    REACTION: unspecified   Prednisone Shortness Of Breath   Betadine [Povidone Iodine] Hives    After surgical skin prep   Sulfa Antibiotics Nausea And Vomiting   Iodine Rash    Broke out on skin once years ago--**Betadine Skin Prep**    ROS: Pertinent items noted in  HPI and remainder of comprehensive ROS otherwise negative.  HOME MEDS: Current Outpatient Medications  Medication Sig Dispense Refill   albuterol (VENTOLIN HFA) 108 (90 Base) MCG/ACT inhaler INHALE 1-2 PUFFS into lings EVERY 4 HOURS AS NEEDED 54 g 0   amLODipine (NORVASC) 5 MG tablet Take 1 tablet (5 mg total) by mouth daily. 90 tablet 0   blood glucose meter kit and supplies Dispense based on patient and insurance preference. Use up to four times daily as directed. (FOR ICD-10 E10.9, E11.9). 1 each 0   budesonide (RHINOCORT AQUA) 32 MCG/ACT nasal spray Place 1 spray into both nostrils daily as needed for allergies.      carvedilol (COREG) 25 MG tablet TAKE ONE TABLET BY MOUTH EVERY MORNING and TAKE ONE TABLET BY MOUTH EVERY EVENING 180 tablet 0   Cholecalciferol (VITAMIN D) 50 MCG (2000 UT) tablet Take 2,000 Units by mouth daily.      citalopram (CELEXA) 10 MG tablet TAKE 1/2 TABLET BY MOUTH EVERY MORNING 45 tablet 2   Continuous Blood Gluc Sensor (FREESTYLE LIBRE 2 SENSOR) MISC 1 Device by Does not apply route every 14 (fourteen) days. 2 each 11   cycloSPORINE (RESTASIS) 0.05 % ophthalmic emulsion Place 1 drop into both eyes 2 (two) times daily.     DOTTI 0.05 MG/24HR patch PLACE ONE PATCH onto THE SKIN TWICE WEEKLY 8 patch  2   famotidine (PEPCID) 20 MG tablet Take 1 tablet (20 mg total) by mouth every evening. 90 tablet 1   Fluticasone-Umeclidin-Vilant (TRELEGY ELLIPTA) 200-62.5-25 MCG/ACT AEPB INHALE 1 PUFF BY MOUTH INTO LUNGS DAILY 180 each 1   hydrochlorothiazide (HYDRODIURIL) 25 MG tablet TAKE ONE TABLET BY MOUTH EVERY MORNING 90 tablet 2   irbesartan (AVAPRO) 150 MG tablet Take 2 tablets (300 mg total) by mouth daily. 180 tablet 0   Lancets (ONETOUCH DELICA PLUS [id number]) MISC USE TO check blood glucose UP TO four times daily AS DIRECTED 100 each 0   levothyroxine (SYNTHROID) 50 MCG tablet TAKE ONE TABLET BY MOUTH EVERY MORNING 90 tablet 0   metFORMIN (GLUCOPHAGE) 500 MG tablet Take 1  tablet (500 mg total) by mouth 2 (two) times daily with a meal. 120 tablet 1   minoxidil (ROGAINE) 2 % external solution Apply topically daily.     Multiple Vitamins-Minerals (ALIVE WOMENS GUMMY PO) Take 1 tablet by mouth daily.     multivitamin-lutein (OCUVITE-LUTEIN) CAPS capsule Take 1 capsule by mouth daily.     ondansetron (ZOFRAN) 4 MG tablet Take 1 tablet (4 mg total) by mouth as directed. 2 tablet 0   ONETOUCH ULTRA test strip USE TO check blood glucose UP TO four times daily AS DIRECTED 100 strip 0   potassium chloride (KLOR-CON) 10 MEQ tablet TAKE ONE TABLET BY MOUTH EVERY EVENING 90 tablet 0   rosuvastatin (CRESTOR) 10 MG tablet TAKE ONE TABLET BY MOUTH every other evening 90 tablet 2   triamcinolone (NASACORT) 55 MCG/ACT AERO nasal inhaler INSTILL 1 SPRAY ONCE DAILY 1 mL 12   TURMERIC PO Take 500 mg by mouth daily.      No current facility-administered medications for this visit.    LABS/IMAGING: No results found for this or any previous visit (from the past 48 hour(s)). No results found.  WEIGHTS: Wt Readings from Last 3 Encounters:  06/03/22 200 lb 12.8 oz (91.1 kg)  05/15/22 201 lb (91.2 kg)  04/09/22 197 lb (89.4 kg)    VITALS: BP 124/78   Pulse (!) 52   Ht 5' 3.75" (1.619 m)   Wt 200 lb 12.8 oz (91.1 kg)   SpO2 98%   BMI 34.74 kg/m   EXAM: General appearance: alert and no distress Neck: no carotid bruit and no JVD Lungs: clear to auscultation bilaterally Heart: regular rate and rhythm, S1, S2 normal, no murmur, click, rub or gallop Abdomen: soft, non-tender; bowel sounds normal; no masses,  no organomegaly Extremities: extremities normal, atraumatic, no cyanosis or edema Pulses: 2+ and symmetric Skin: Skin color, texture, turgor normal. No rashes or lesions Neurologic: Grossly normal Psych: Pleasant  EKG: Sinus bradycardia 52-personally reviewed  ASSESSMENT: Frequent symptomatic PVC's - improved Chest pain, more likely esophageal spasm - low risk  exercise nuclear stress test (2017) Hypertension Dyslipidemia Neuro-vestibular imbalance  PLAN: 1.  Amber Brewer is doing well with much improved blood pressure control.  Her cholesterol is at goal with target LDL less than 100.  She has infrequent PVCs which have improved somewhat as well.  No chest pain or worsening shortness of breath.  We will continue her current therapies.  Follow-up with me annually or sooner as necessary.  Pixie Casino, MD, [facility], [place] Director of the [facility]  [facility] Diplomate of the American Board of Clinical Lipidology Attending Cardiologist  Direct Dial: [phone]  Fax: [phone]  Website:  [email or web address] 06/03/2022, 8:33 AM

## 2022-07-13 DIAGNOSIS — H11421 Conjunctival edema, right eye: Secondary | ICD-10-CM | POA: Diagnosis not present

## 2022-07-15 DIAGNOSIS — H1045 Other chronic allergic conjunctivitis: Secondary | ICD-10-CM | POA: Diagnosis not present

## 2022-07-18 DIAGNOSIS — H11421 Conjunctival edema, right eye: Secondary | ICD-10-CM | POA: Diagnosis not present

## 2022-07-23 DIAGNOSIS — R5383 Other fatigue: Secondary | ICD-10-CM | POA: Diagnosis not present

## 2022-07-23 DIAGNOSIS — R051 Acute cough: Secondary | ICD-10-CM | POA: Diagnosis not present

## 2022-07-23 DIAGNOSIS — J029 Acute pharyngitis, unspecified: Secondary | ICD-10-CM | POA: Diagnosis not present

## 2022-07-23 DIAGNOSIS — B349 Viral infection, unspecified: Secondary | ICD-10-CM | POA: Diagnosis not present

## 2022-07-26 DIAGNOSIS — R051 Acute cough: Secondary | ICD-10-CM | POA: Diagnosis not present

## 2022-08-20 DIAGNOSIS — L570 Actinic keratosis: Secondary | ICD-10-CM | POA: Diagnosis not present

## 2022-08-23 ENCOUNTER — Other Ambulatory Visit: Payer: Self-pay | Admitting: Internal Medicine

## 2022-08-23 ENCOUNTER — Other Ambulatory Visit: Payer: Self-pay | Admitting: Adult Health

## 2022-08-23 ENCOUNTER — Other Ambulatory Visit: Payer: Self-pay | Admitting: Allergy and Immunology

## 2022-08-24 ENCOUNTER — Other Ambulatory Visit: Payer: Self-pay | Admitting: Adult Health

## 2022-08-24 DIAGNOSIS — R7303 Prediabetes: Secondary | ICD-10-CM

## 2022-08-26 ENCOUNTER — Telehealth: Payer: Self-pay | Admitting: Pharmacist

## 2022-08-26 NOTE — Chronic Care Management (AMB) (Unsigned)
Chronic Care Management Pharmacy Assistant   Name: Amber Brewer  MRN: [id number] DOB: June 19, 1951  Reason for Encounter: Medication Review/ Medication Coordination    Recent office visits:  06/03/22 Pixie Casino, MD (Cardiology) - Patient presented for Essential hypertension and other concerns. No medication changes.  Recent consult visits:  08/20/22 Axner, Marye Round (Dermatology) - Patient presented for Actinic Keratosis. Prescribed Fluorouracil.   Hospital visits:  None in previous 6 months  Medications: Outpatient Encounter Medications as of 08/26/2022  Medication Sig   albuterol (VENTOLIN HFA) 108 (90 Base) MCG/ACT inhaler INHALE 1-2 PUFFS into lings EVERY 4 HOURS AS NEEDED   amLODipine (NORVASC) 5 MG tablet TAKE ONE TABLET BY MOUTH ONCE DAILY   blood glucose meter kit and supplies Dispense based on patient and insurance preference. Use up to four times daily as directed. (FOR ICD-10 E10.9, E11.9).   budesonide (RHINOCORT AQUA) 32 MCG/ACT nasal spray Place 1 spray into both nostrils daily as needed for allergies.    carvedilol (COREG) 25 MG tablet TAKE ONE TABLET BY MOUTH EVERY MORNING and TAKE ONE TABLET BY MOUTH EVERY EVENING   Cholecalciferol (VITAMIN D) 50 MCG (2000 UT) tablet Take 2,000 Units by mouth daily.    citalopram (CELEXA) 10 MG tablet TAKE 1/2 TABLET BY MOUTH EVERY MORNING   Continuous Blood Gluc Sensor (FREESTYLE LIBRE 2 SENSOR) MISC 1 Device by Does not apply route every 14 (fourteen) days.   cycloSPORINE (RESTASIS) 0.05 % ophthalmic emulsion Place 1 drop into both eyes 2 (two) times daily.   DOTTI 0.05 MG/24HR patch PLACE ONE PATCH onto THE SKIN TWICE WEEKLY   famotidine (PEPCID) 20 MG tablet TAKE ONE TABLET BY MOUTH EVERYDAY AT BEDTIME   Fluticasone-Umeclidin-Vilant (TRELEGY ELLIPTA) 200-62.5-25 MCG/ACT AEPB INHALE 1 PUFF BY MOUTH INTO LUNGS DAILY   hydrochlorothiazide (HYDRODIURIL) 25 MG tablet TAKE ONE TABLET BY MOUTH EVERY MORNING   irbesartan (AVAPRO)  150 MG tablet TAKE ONE TABLET BY MOUTH TWICE DAILY   Lancets (ONETOUCH DELICA PLUS [id number]) MISC USE TO check blood glucose UP TO four times daily AS DIRECTED   levothyroxine (SYNTHROID) 50 MCG tablet TAKE ONE TABLET BY MOUTH EVERY MORNING   metFORMIN (GLUCOPHAGE) 500 MG tablet Take 1 tablet (500 mg total) by mouth 2 (two) times daily with a meal.   minoxidil (ROGAINE) 2 % external solution Apply topically daily.   Multiple Vitamins-Minerals (ALIVE WOMENS GUMMY PO) Take 1 tablet by mouth daily.   multivitamin-lutein (OCUVITE-LUTEIN) CAPS capsule Take 1 capsule by mouth daily.   ondansetron (ZOFRAN) 4 MG tablet Take 1 tablet (4 mg total) by mouth as directed.   ONETOUCH ULTRA test strip USE TO check blood glucose UP TO four times daily AS DIRECTED   potassium chloride (KLOR-CON) 10 MEQ tablet TAKE ONE TABLET BY MOUTH EVERY EVENING   rosuvastatin (CRESTOR) 10 MG tablet TAKE ONE TABLET BY MOUTH every other evening   triamcinolone (NASACORT) 55 MCG/ACT AERO nasal inhaler INSTILL 1 SPRAY ONCE DAILY   TURMERIC PO Take 500 mg by mouth daily.    No facility-administered encounter medications on file as of 08/26/2022.   Reviewed chart for medication changes ahead of medication coordination call.  BP Readings from Last 3 Encounters:  06/03/22 124/78  05/15/22 130/80  04/09/22 129/64    Lab Results  Component Value Date   HGBA1C 6.9 (H) 05/15/2022     Patient obtains medications through Adherence Packaging  90 Days   Last adherence delivery included:  Amlodipine (Norvasc) 5 mg: take  one tablet at  breakfast Freestyle Libre 2 Sensors: change every 14 days  Citalopram (celexa) 10 mg:  tablet daily at breakfast   Levothyroxine (synthroid) 50 MCG: one tablet at breakfast Potassium Chloride (Klor-Con) 10 MEQ: one tablet daily at dinner Famotidine (pepcid) 20 mg: one tablet daily at bedtime Vitamin D3 2000 units daily: one tablet daily at dinner [place] Adult Multivitamin 50+ 1  capsule daily: one at breakfast Turmeric 500 mg: one tablet daily at dinner Hydrochlorothiazide 25 MG tablet: one tablet at breakfast Rosuvastatin 10 mg: one tablet every other day at dinner Irbesartan (avapro)150 mg: Take 1 tablet by mouth at breakfast and at dinner Metformin 500 mg 1 tablet daily with breakfast and 1 at dinner Carvedilol (Coreg) 25 mg: one tablet twice daily at breakfast and dinner       Patient declined the following medications (meds) due to (reason) Albuterol (PROAIR HFA) 108 (90 Base) MCG/ACT inhaler Estradiol (Vivelle-Dot) 0.05 mg 24 hr patch: one patch onto skin twice a week  Trelegy Ellipta: to be delivered on 8/9 pt aware Strips/lancets( using freestyle requested hold)     Confirmed delivery date of 06/07/22, advised patient that pharmacy will contact them the morning of delivery. Patient requested delivery be sent to her work at [employer] on the AM route since she will be working and they close at 5, updated form.   Notes: Per Jeni Salles inquired if patient is still noticing a difference in the range of her blood sugars with the [facility] VS Finger stick. She reports the App is consistently 10-14 numbers lower than a finger stick that is done immediately after checking phone. She inquired if she should go back to the original machine advised I would speak with MP for further guidance.   Per MP the [facility]en[facility]ato has a 15-20 min time lag vs fingerstick as it is using interstitial fluid glucose and there is a 20 point parameter for accuracy. Call to patient to advise and offer an office appointment for calibration education, she reports she will call if she runs into any issues in the future but is glad to hear her readings are accurate.   Patient is due for next adherence delivery on: 09/05/22. Called patient and reviewed medications and coordinated delivery. Packs 90 DS  This delivery to include: Amlodipine (Norvasc) 5 mg: take one tablet at   breakfast Freestyle Libre 2 Sensors: change every 14 days  Citalopram (celexa) 10 mg:  tablet daily at breakfast   Levothyroxine (synthroid) 50 MCG: one tablet at breakfast Potassium Chloride (Klor-Con) 10 MEQ: one tablet daily at dinner Famotidine (pepcid) 20 mg: one tablet daily at bedtime Vitamin D3 2000 units daily: one tablet daily at dinner [place] Adult Multivitamin 50+ 1 capsule daily: one at breakfast Turmeric 500 mg: one tablet daily at dinner Hydrochlorothiazide 25 MG tablet: one tablet at breakfast Rosuvastatin 10 mg: one tablet every other day at dinner Irbesartan (avapro)150 mg: Take 1 tablet by mouth at breakfast and at dinner Metformin 500 mg 1 tablet daily with breakfast and 1 at dinner Carvedilol (Coreg) 25 mg: one tablet twice daily at breakfast and dinner   Unable to reach to confirm delivery date of 09/05/22, left msg for patient that pharmacy will contact them the morning of delivery.   Care Gaps: Zoster Vaccine - Overdue COVID Booster - Overdue [place] Vaccine - Overdue TDAP - Postponed BP- 124/78 06/03/22 AWV- 04/09/22 Lab Results  Component Value Date   HGBA1C 6.9 (H)  05/15/2022    Star Rating Drugs: Ibesartan (Avapro) 150 mg - Last filled 06/04/22 90 DS at [facility] Rosuvastatin (Crestor) 10 mg - Last filled 06/04/22 90 DS at [facility] Metformin (Glucophage) 500 mg - Last filled 06/05/22 90 DS at  [place] Pharmacist Assistant [phone]

## 2022-08-26 NOTE — Telephone Encounter (Signed)
Last OV 05/15/22 provider notes: All of her labs look great except for her A1c, she is now out of the prediabetic range and is diabetic. I want to increase her metformin to 500 mg BID. She is encouraged to work on weight loss through diet and exercise. Please follow up in 3 months for recheck   Pt notified of above & appt scheduled for 09/05/22 at pt request.

## 2022-09-05 ENCOUNTER — Encounter: Payer: Self-pay | Admitting: Adult Health

## 2022-09-05 ENCOUNTER — Ambulatory Visit (INDEPENDENT_AMBULATORY_CARE_PROVIDER_SITE_OTHER): Payer: PPO | Admitting: Adult Health

## 2022-09-05 VITALS — BP 132/84 | HR 60 | Temp 98.1°F | Ht 63.75 in | Wt 197.0 lb

## 2022-09-05 DIAGNOSIS — Z23 Encounter for immunization: Secondary | ICD-10-CM

## 2022-09-05 DIAGNOSIS — I10 Essential (primary) hypertension: Secondary | ICD-10-CM | POA: Diagnosis not present

## 2022-09-05 DIAGNOSIS — E118 Type 2 diabetes mellitus with unspecified complications: Secondary | ICD-10-CM | POA: Diagnosis not present

## 2022-09-05 LAB — POCT GLYCOSYLATED HEMOGLOBIN (HGB A1C): Hemoglobin A1C: 5.9 % — AB (ref 4.0–5.6)

## 2022-09-05 NOTE — Patient Instructions (Signed)
Your A1c today was 5.9!  Lets follow up in three months

## 2022-09-05 NOTE — Progress Notes (Signed)
Subjective:    Patient ID: Amber Brewer, female    DOB: 06/09/1951, 71 y.o.   MRN: [id number]  HPI 71 year old female who  has a past medical history of Allergy, Anemia, Asthma, Bowen's disease, Chicken pox, DDD (degenerative disc disease), cervical, Depression, Diabetes mellitus without complication ([place]), Gallstones, GERD (gastroesophageal reflux disease), melanoma in situ (2002), UTI (urinary tract infection), Hyperlipidemia, Hypertension, Hypothyroidism, IBS (irritable bowel syndrome), Melanoma ([place]), Nephrolithiasis, Osteoarthritis, PVC's (premature ventricular contractions), and Status post dilation of esophageal narrowing.  She presents to the office today for 71-monthfollow-up regarding diabetes and hypertension   DM Type 2 -formally diagnosed in July 2023 at her CPE when her A1c was 6.9.  At this time she was on metformin 250 daily for prediabetes.  This was changed to 500 mg twice daily.  She has been tolerating this medication well and has been working on lifestyle modifications.  She continues to use the [facility] to manage her blood sugars with average readings of 126. She denies episodes of hypoglycemia.   Wt Readings from Last 3 Encounters:  09/05/22 197 lb (89.4 kg)  06/03/22 200 lb 12.8 oz (91.1 kg)  05/15/22 201 lb (91.2 kg)   Hypertension-managed with Norvasc 5 mg daily, Coreg 25 mg twice daily, HCTZ 25 mg daily, and Avapro 150 mg daily.  He does take a potassium supplement of 10 meq .  She checks her blood pressures at home with readings mostly in the 120s over 70s Wt Readings from Last 3 Encounters:  09/05/22 197 lb (89.4 kg)  06/03/22 200 lb 12.8 oz (91.1 kg)  05/15/22 201 lb (91.2 kg)   Review of Systems See HPI   Past Medical History:  Diagnosis Date   Allergy    Anemia    age [age]   Asthma    Bowen's disease    ~ 17 yrs ago   Chicken pox    DDD (degenerative disc disease), cervical    Depression    Diabetes mellitus [place] complication ([place]     on metformin - goes back andf forth   Gallstones    GERD (gastroesophageal reflux disease)    Hx of melanoma in situ 2002   Hx: UTI (urinary tract infection)    Hyperlipidemia    Hypertension    Hypothyroidism    IBS (irritable bowel syndrome)    with D   Melanoma (HCC)    x2    Nephrolithiasis    Osteoarthritis    PVC's (premature ventricular contractions)    Status post dilation of esophageal narrowing     Social History   Socioeconomic History   Marital status: Married    Spouse name: Not on file   Number of children: 2   Years of education: 13   Highest education level: Not on file  Occupational History   Occupation: licensed optician/retired    Employer: OTHER    Comment: WOffice manager Tobacco Use   Smoking status: Never   Smokeless tobacco: Never  Vaping Use   Vaping Use: Never used  Substance and Sexual Activity   Alcohol use: No   Drug use: No   Sexual activity: Yes    Birth control/protection: Post-menopausal, Surgical  Other Topics Concern   Not on file  Social History Narrative   epworth sleepiness scale = 8 (01/18/2016)   Lives at home with her husband and disabled son   Right handed   Caffeine: decaf   Social Determinants of Health  Financial Resource Strain: Low Risk  (04/09/2022)   Overall Financial Resource Strain (CARDIA)    Difficulty of Paying Living Expenses: Not hard at all  Food Insecurity: No Food Insecurity (04/09/2022)   Hunger Vital Sign    Worried About Running Out of Food in the Last Year: Never true    Ran Out of Food in the Last Year: Never true  Transportation Needs: No Transportation Needs (04/09/2022)   PRAPARE - Hydrologist (Medical): No    Lack of Transportation (Non-Medical): No  Physical Activity: Inactive (04/09/2022)   Exercise Vital Sign    Days of Exercise per Week: 0 days    Minutes of Exercise per Session: 0 min  Stress: No Stress Concern Present (04/09/2022)   [place]    Feeling of Stress : Not at all  Social Connections: [place] (04/09/2022)   Social Connection and Isolation Panel [NHANES]    Frequency of Communication with Friends and Family: More than three times a week    Frequency of Social Gatherings with Friends and Family: More than three times a week    Attends Religious Services: More than 4 times per year    Active Member of [employer] or Organizations: Yes    Attends Music therapist: More than 4 times per year    Marital Status: Married  Human resources officer Violence: Not At Risk (04/09/2022)   Humiliation, Afraid, Rape, and Kick questionnaire    Fear of Current or Ex-Partner: No    Emotionally Abused: No    Physically Abused: No    Sexually Abused: No    Past Surgical History:  Procedure Laterality Date   ABDOMINAL HYSTERECTOMY  [place]   BREAST EXCISIONAL BIOPSY     CATARACT EXTRACTION Bilateral 2015   [place]     ~17 yrs ago- Bowens dx per pt with surgery   removal of melanoma in situ      Family History  Problem Relation Age of Onset   Hypertension Mother    Thyroid disease Mother    Arthritis Mother    Depression Mother    Hearing loss Mother    Miscarriages / Stillbirths Mother    Heart attack Father    Heart disease Father    Alcohol abuse Father    Arthritis Father    Depression Father    Drug abuse Father    Hypertension Father    Alcohol abuse Sister    Arthritis Sister    Depression Sister    Hearing loss Sister    Hypotension Sister    Depression Sister    Heart disease Sister    Hyperlipidemia Sister    Hypertension Maternal Aunt    Hypertension Maternal Uncle    Clotting disorder Maternal Uncle    Prostate cancer Maternal Uncle    Colon cancer Maternal Uncle    Colon polyps Maternal Uncle    Diabetes Maternal Uncle        all uncles    Arthritis Maternal Grandmother    Asthma Maternal Grandmother    Breast cancer Maternal Grandmother    Stomach cancer Maternal Grandmother    Hearing loss Maternal Grandmother    Diabetes Maternal Grandfather    Heart disease Maternal Grandfather    Arthritis Maternal Grandfather    Hearing loss Maternal  Grandfather    Hypertension Maternal Grandfather    Kidney disease Maternal Grandfather    Hyperlipidemia Paternal Grandmother        also MI   Breast cancer Paternal Grandmother    Arthritis Paternal Grandmother    Heart attack Paternal Grandmother    Hyperlipidemia Paternal Grandfather        also MI   Heart disease Paternal Grandfather    Heart attack Paternal Grandfather    Endometriosis Daughter    Hypertension Son    Esophageal cancer Neg Hx    Rectal cancer Neg Hx     Allergies  Allergen Reactions   Codeine Phosphate Nausea And Vomiting    REACTION: unspecified   Prednisone Shortness Of Breath   Betadine [Povidone Iodine] Hives    After surgical skin prep   Sulfa Antibiotics Nausea And Vomiting   Iodine Rash    Broke out on skin once years ago--**Betadine Skin Prep**    Current Outpatient Medications on File Prior to Visit  Medication Sig Dispense Refill   albuterol (VENTOLIN HFA) 108 (90 Base) MCG/ACT inhaler INHALE 1-2 PUFFS into lings EVERY 4 HOURS AS NEEDED 54 g 0   amLODipine (NORVASC) 5 MG tablet TAKE ONE TABLET BY MOUTH ONCE DAILY 90 tablet 3   blood glucose meter kit and supplies Dispense based on patient and insurance preference. Use up to four times daily as directed. (FOR ICD-10 E10.9, E11.9). 1 each 0   budesonide (RHINOCORT AQUA) 32 MCG/ACT nasal spray Place 1 spray into both nostrils daily as needed for allergies.      carvedilol (COREG) 25 MG tablet TAKE ONE TABLET BY MOUTH EVERY MORNING and TAKE ONE TABLET BY MOUTH EVERY EVENING 180 tablet 3   Cholecalciferol (VITAMIN D) 50 MCG (2000 UT) tablet Take 2,000 Units by mouth daily.      citalopram  (CELEXA) 10 MG tablet TAKE 1/2 TABLET BY MOUTH EVERY MORNING 45 tablet 2   Continuous Blood Gluc Sensor (FREESTYLE LIBRE 2 SENSOR) MISC 1 Device by Does not apply route every 14 (fourteen) days. 2 each 11   cycloSPORINE (RESTASIS) 0.05 % ophthalmic emulsion Place 1 drop into both eyes 2 (two) times daily.     DOTTI 0.05 MG/24HR patch PLACE ONE PATCH onto THE SKIN TWICE WEEKLY 8 patch 2   famotidine (PEPCID) 20 MG tablet TAKE ONE TABLET BY MOUTH EVERYDAY AT BEDTIME 90 tablet 1   Fluticasone-Umeclidin-Vilant (TRELEGY ELLIPTA) 200-62.5-25 MCG/ACT AEPB INHALE 1 PUFF BY MOUTH INTO LUNGS DAILY 180 each 1   hydrochlorothiazide (HYDRODIURIL) 25 MG tablet TAKE ONE TABLET BY MOUTH EVERY MORNING 90 tablet 2   irbesartan (AVAPRO) 150 MG tablet TAKE ONE TABLET BY MOUTH TWICE DAILY 180 tablet 3   Lancets (ONETOUCH DELICA PLUS [id number]) MISC USE TO check blood glucose UP TO four times daily AS DIRECTED 100 each 0   levothyroxine (SYNTHROID) 50 MCG tablet TAKE ONE TABLET BY MOUTH EVERY MORNING 90 tablet 2   metFORMIN (GLUCOPHAGE) 500 MG tablet TAKE ONE TABLET BY MOUTH TWICE DAILY WITH A MEAL 120 tablet 0   minoxidil (ROGAINE) 2 % external solution Apply topically daily.     Multiple Vitamins-Minerals (ALIVE WOMENS GUMMY PO) Take 1 tablet by mouth daily.     multivitamin-lutein (OCUVITE-LUTEIN) CAPS capsule Take 1 capsule by mouth daily.     ondansetron (ZOFRAN) 4 MG tablet Take 1 tablet (4 mg total) by mouth as directed. 2 tablet 0   ONETOUCH ULTRA test strip USE TO check blood glucose  UP TO four times daily AS DIRECTED 100 strip 0   potassium chloride (KLOR-CON) 10 MEQ tablet TAKE ONE TABLET BY MOUTH EVERY EVENING 90 tablet 2   rosuvastatin (CRESTOR) 10 MG tablet TAKE ONE TABLET BY MOUTH every other evening 90 tablet 2   triamcinolone (NASACORT) 55 MCG/ACT AERO nasal inhaler INSTILL 1 SPRAY ONCE DAILY 1 mL 12   TURMERIC PO Take 500 mg by mouth daily.      No current facility-administered medications on file  prior to visit.    BP (!) 150/80   Pulse 60   Temp 98.1 F (36.7 C) (Oral)   Ht 5' 3.75" (1.619 m)   Wt 197 lb (89.4 kg)   SpO2 99%   BMI 34.08 kg/m       Objective:   Physical Exam Vitals and nursing note reviewed.  Constitutional:      Appearance: Normal appearance.  Cardiovascular:     Rate and Rhythm: Normal rate and regular rhythm.     Pulses: Normal pulses.     Heart sounds: Normal heart sounds.  Pulmonary:     Breath sounds: Normal breath sounds.  Skin:    General: Skin is warm and dry.  Neurological:     General: No focal deficit present.     Mental Status: She is alert and oriented to person, place, and time.  Psychiatric:        Mood and Affect: Mood normal.        Behavior: Behavior normal.        Thought Content: Thought content normal.       Assessment & Plan:  1. Essential hypertension - Well controlled. No change in medications   2. Type 2 diabetes mellitus with complications (HCC)  - POC HgB A1c- 5.9  - Improved and at goal  - Continue with Metformin 500 mg BID   3. Need for immunization against influenza  - Flu Vaccine QUAD High Dose(Fluad)  [name], NP

## 2022-09-10 DIAGNOSIS — E119 Type 2 diabetes mellitus without complications: Secondary | ICD-10-CM | POA: Diagnosis not present

## 2022-09-24 ENCOUNTER — Ambulatory Visit (INDEPENDENT_AMBULATORY_CARE_PROVIDER_SITE_OTHER): Payer: PPO | Admitting: Family

## 2022-09-24 ENCOUNTER — Encounter: Payer: Self-pay | Admitting: Family

## 2022-09-24 VITALS — BP 108/69 | HR 65 | Temp 98.0°F | Ht 63.75 in | Wt 198.4 lb

## 2022-09-24 DIAGNOSIS — J4541 Moderate persistent asthma with (acute) exacerbation: Secondary | ICD-10-CM | POA: Diagnosis not present

## 2022-09-24 NOTE — Progress Notes (Signed)
Patient ID: Amber Brewer, female    DOB: 1951/09/17, 71 y.o.   MRN: [id number]  Chief Complaint  Patient presents with  . Nasal Congestion    Pt c/o Nasal congestion, sore throat, Dry cough for about 3 days. Sore throat has went away. Has tried robitussin which does not help.     HPI:      URI sx:  Pt c/o Nasal congestion, sore throat, Dry cough for about 3 days. Sore throat has went away. Has tried robitussin which does not help. Reports being sick whole month of October and testing all negative. Reports continuous runny nose. Hx of Asthma.  Assessment & Plan:     Subjective:    Outpatient Medications Prior to Visit  Medication Sig Dispense Refill  . albuterol (VENTOLIN HFA) 108 (90 Base) MCG/ACT inhaler INHALE 1-2 PUFFS into lings EVERY 4 HOURS AS NEEDED 54 g 0  . amLODipine (NORVASC) 5 MG tablet TAKE ONE TABLET BY MOUTH ONCE DAILY 90 tablet 3  . blood glucose meter kit and supplies Dispense based on patient and insurance preference. Use up to four times daily as directed. (FOR ICD-10 E10.9, E11.9). 1 each 0  . budesonide (RHINOCORT AQUA) 32 MCG/ACT nasal spray Place 1 spray into both nostrils daily as needed for allergies.     . carvedilol (COREG) 25 MG tablet TAKE ONE TABLET BY MOUTH EVERY MORNING and TAKE ONE TABLET BY MOUTH EVERY EVENING 180 tablet 3  . Cholecalciferol (VITAMIN D) 50 MCG (2000 UT) tablet Take 2,000 Units by mouth daily.     . citalopram (CELEXA) 10 MG tablet TAKE 1/2 TABLET BY MOUTH EVERY MORNING 45 tablet 2  . Continuous Blood Gluc Sensor (FREESTYLE LIBRE 2 SENSOR) MISC 1 Device by Does not apply route every 14 (fourteen) days. 2 each 11  . cycloSPORINE (RESTASIS) 0.05 % ophthalmic emulsion Place 1 drop into both eyes 2 (two) times daily.    . DOTTI 0.05 MG/24HR patch PLACE ONE PATCH onto THE SKIN TWICE WEEKLY 8 patch 2  . famotidine (PEPCID) 20 MG tablet TAKE ONE TABLET BY MOUTH EVERYDAY AT BEDTIME 90 tablet 1  . Fluticasone-Umeclidin-Vilant (TRELEGY  ELLIPTA) 200-62.5-25 MCG/ACT AEPB INHALE 1 PUFF BY MOUTH INTO LUNGS DAILY 180 each 1  . hydrochlorothiazide (HYDRODIURIL) 25 MG tablet TAKE ONE TABLET BY MOUTH EVERY MORNING 90 tablet 2  . irbesartan (AVAPRO) 150 MG tablet TAKE ONE TABLET BY MOUTH TWICE DAILY 180 tablet 3  . Lancets (ONETOUCH DELICA PLUS [id number]) MISC USE TO check blood glucose UP TO four times daily AS DIRECTED 100 each 0  . levothyroxine (SYNTHROID) 50 MCG tablet TAKE ONE TABLET BY MOUTH EVERY MORNING 90 tablet 2  . metFORMIN (GLUCOPHAGE) 500 MG tablet TAKE ONE TABLET BY MOUTH TWICE DAILY WITH A MEAL 120 tablet 0  . minoxidil (ROGAINE) 2 % external solution Apply topically daily.    . Multiple Vitamins-Minerals (ALIVE WOMENS GUMMY PO) Take 1 tablet by mouth daily.    . multivitamin-lutein (OCUVITE-LUTEIN) CAPS capsule Take 1 capsule by mouth daily.    . ondansetron (ZOFRAN) 4 MG tablet Take 1 tablet (4 mg total) by mouth as directed. 2 tablet 0  . ONETOUCH ULTRA test strip USE TO check blood glucose UP TO four times daily AS DIRECTED 100 strip 0  . potassium chloride (KLOR-CON) 10 MEQ tablet TAKE ONE TABLET BY MOUTH EVERY EVENING 90 tablet 2  . rosuvastatin (CRESTOR) 10 MG tablet TAKE ONE TABLET BY MOUTH every other evening 90 tablet  2  . triamcinolone (NASACORT) 55 MCG/ACT AERO nasal inhaler INSTILL 1 SPRAY ONCE DAILY 1 mL 12  . TURMERIC PO Take 500 mg by mouth daily.      No facility-administered medications prior to visit.   Past Medical History:  Diagnosis Date  . Allergy   . Anemia    age 46 only  . Asthma   . Bowen's disease    ~ 17 yrs ago  . Chicken pox   . DDD (degenerative disc disease), cervical   . Depression   . Diabetes mellitus without complication ([place])    on metformin - goes back andf forth  . Gallstones   . GERD (gastroesophageal reflux disease)   . Hx of melanoma in situ 2002  . Hx: UTI (urinary tract infection)   . Hyperlipidemia   . Hypertension   . Hypothyroidism   . IBS (irritable  bowel syndrome)    with D  . Melanoma ([place])    x2   . Nephrolithiasis   . Osteoarthritis   . PVC's (premature ventricular contractions)   . Status post dilation of esophageal narrowing    Past Surgical History:  Procedure Laterality Date  . ABDOMINAL HYSTERECTOMY  1974  . APPENDECTOMY  1977  . BLADDER SURGERY  1974  . BREAST EXCISIONAL BIOPSY    . CATARACT EXTRACTION Bilateral 2015  . CHOLECYSTECTOMY  1987  . COLONOSCOPY    . RECTAL SURGERY     ~17 yrs ago- [facility] dx per pt with surgery  . removal of melanoma in situ     Allergies  Allergen Reactions  . Codeine Phosphate Nausea And Vomiting    REACTION: unspecified  . Prednisone Shortness Of Breath  . Betadine [Povidone Iodine] Hives    After surgical skin prep  . Sulfa Antibiotics Nausea And Vomiting  . Iodine Rash    Broke out on skin once years ago--**Betadine Skin Prep**      Objective:    Physical Exam Vitals and nursing note reviewed.  Constitutional:      Appearance: Normal appearance.  Cardiovascular:     Rate and Rhythm: Normal rate and regular rhythm.  Pulmonary:     Effort: Pulmonary effort is normal.     Breath sounds: Normal breath sounds.  Musculoskeletal:        General: Normal range of motion.  Skin:    General: Skin is warm and dry.  Neurological:     Mental Status: She is alert.  Psychiatric:        Mood and Affect: Mood normal.        Behavior: Behavior normal.   BP 108/69 (BP Location: Left Arm, Patient Position: Sitting, Cuff Size: Large)   Pulse 65   Temp 98 F (36.7 C) (Temporal)   Ht 5' 3.75" (1.619 m)   Wt 198 lb 6 oz (90 kg)   SpO2 93%   BMI 34.32 kg/m  Wt Readings from Last 3 Encounters:  09/24/22 198 lb 6 oz (90 kg)  09/05/22 197 lb (89.4 kg)  06/03/22 200 lb 12.8 oz (91.1 kg)      Jeanie Sewer, NP

## 2022-09-24 NOTE — Patient Instructions (Addendum)
It was very nice to see you today!   As discussed, increase your Trelegy inhaler to twice a day for the next 5 days or until your cough is better. Use your Albuterol inhaler, 2 puffs, at bedtime and in the morning for the next 5 days. Restart your Claritin daily for the next 5-7 days. Increase your water intake to 8 cups daily to thin your mucus.  Call back Friday if not feeling better at all.    PLEASE NOTE:  If you had any lab tests please let us know if you have not heard back within a few days. You may see your results on MyChart before we have a chance to review them but we will give you a call once they are reviewed by [place]. If we ordered any referrals today, please let us know if you have not heard from their office within the next week.

## 2022-09-25 ENCOUNTER — Telehealth: Payer: Self-pay | Admitting: Adult Health

## 2022-09-25 DIAGNOSIS — J4541 Moderate persistent asthma with (acute) exacerbation: Secondary | ICD-10-CM

## 2022-09-25 NOTE — Telephone Encounter (Signed)
Patient states she was told at Office Visit on 09/24/22 that if her symptoms worsened a RX for an antibiotic would be sent to Pharmacy.  Patient states she is now running a fever of 101 and her voice is crackling, bad cough.  Requests RX for an antibiotic be sent to  [facility] [place], [place] - [address] AT [place] Phone: [phone]  Fax: [phone]

## 2022-09-27 ENCOUNTER — Telehealth: Payer: Self-pay | Admitting: Adult Health

## 2022-09-27 MED ORDER — AZITHROMYCIN 250 MG PO TABS: ORAL_TABLET | ORAL | 0 refills | Status: AC

## 2022-09-27 NOTE — Telephone Encounter (Signed)
Patient is still not feeling any better - would like antibiotics to be called in-

## 2022-09-27 NOTE — Telephone Encounter (Signed)
RX sent, no Mychart msg - able to reach pt via phone.

## 2022-09-27 NOTE — Addendum Note (Signed)
Addended byJeanie Sewer on: 09/27/2022 06:23 PM   Modules accepted: Orders

## 2022-09-27 NOTE — Telephone Encounter (Signed)
RX sent, pt notified via MyChart, no answer on phone.

## 2022-10-03 ENCOUNTER — Other Ambulatory Visit: Payer: Self-pay | Admitting: Allergy and Immunology

## 2022-11-06 DIAGNOSIS — L570 Actinic keratosis: Secondary | ICD-10-CM | POA: Diagnosis not present

## 2022-11-06 DIAGNOSIS — D485 Neoplasm of uncertain behavior of skin: Secondary | ICD-10-CM | POA: Diagnosis not present

## 2022-11-06 DIAGNOSIS — L578 Other skin changes due to chronic exposure to nonionizing radiation: Secondary | ICD-10-CM | POA: Diagnosis not present

## 2022-11-06 DIAGNOSIS — D225 Melanocytic nevi of trunk: Secondary | ICD-10-CM | POA: Diagnosis not present

## 2022-11-06 DIAGNOSIS — L821 Other seborrheic keratosis: Secondary | ICD-10-CM | POA: Diagnosis not present

## 2022-11-06 DIAGNOSIS — L820 Inflamed seborrheic keratosis: Secondary | ICD-10-CM | POA: Diagnosis not present

## 2022-11-06 DIAGNOSIS — D2271 Melanocytic nevi of right lower limb, including hip: Secondary | ICD-10-CM | POA: Diagnosis not present

## 2022-11-06 DIAGNOSIS — Z808 Family history of malignant neoplasm of other organs or systems: Secondary | ICD-10-CM | POA: Diagnosis not present

## 2022-11-06 DIAGNOSIS — Z87898 Personal history of other specified conditions: Secondary | ICD-10-CM | POA: Diagnosis not present

## 2022-11-12 ENCOUNTER — Ambulatory Visit
Admission: RE | Admit: 2022-11-12 | Discharge: 2022-11-12 | Disposition: A | Discharge status: Home / Self Care | Payer: PPO | Source: Ambulatory Visit | Attending: Adult Health | Admitting: Adult Health

## 2022-11-12 DIAGNOSIS — N951 Menopausal and female climacteric states: Secondary | ICD-10-CM

## 2022-11-12 DIAGNOSIS — M85851 Other specified disorders of bone density and structure, right thigh: Secondary | ICD-10-CM | POA: Diagnosis not present

## 2022-11-12 DIAGNOSIS — Z780 Asymptomatic menopausal state: Secondary | ICD-10-CM | POA: Diagnosis not present

## 2022-11-22 ENCOUNTER — Telehealth: Payer: Self-pay

## 2022-11-22 ENCOUNTER — Other Ambulatory Visit: Payer: Self-pay | Admitting: Adult Health

## 2022-11-22 DIAGNOSIS — F411 Generalized anxiety disorder: Secondary | ICD-10-CM

## 2022-11-22 DIAGNOSIS — R7303 Prediabetes: Secondary | ICD-10-CM

## 2022-11-22 NOTE — Progress Notes (Signed)
Patient ID: Amber Brewer, female   DOB: September 13, 1951, 72 y.o.   MRN: [id number]  Care Management & Coordination Services Pharmacy Team  Reason for Encounter: Medication coordination and delivery  Contacted patient to discuss medications and coordinate delivery from [facility]. {US HC Outreach:28874} Cycle dispensing form sent to *** for review.   Last adherence delivery date:09/05/22      Patient is due for next adherence delivery on: 12/05/22  This delivery to include: Adherence Packaging  90 Days  Trelegy Inhaler : 1 Puff daily Amlodipine (Norvasc) 5 mg: take one tablet at  breakfast Freestyle Libre 2 Sensors: change every 14 days  Citalopram (celexa) 10 mg:  tablet daily at breakfast   Levothyroxine (synthroid) 50 MCG: one tablet at breakfast Potassium Chloride (Klor-Con) 10 MEQ: one tablet daily at dinner Famotidine (pepcid) 20 mg: one tablet daily at bedtime Vitamin D3 2000 units daily: one tablet daily at dinner [place] Adult Multivitamin 50+ 1 capsule daily: one at breakfast Turmeric 500 mg: one tablet daily at dinner Hydrochlorothiazide 25 MG tablet: one tablet at breakfast Rosuvastatin 10 mg: one tablet every other day at dinner Irbesartan (avapro)150 mg: Take 1 tablet by mouth at breakfast and at dinner Metformin 500 mg 1 tablet daily with breakfast and 1 at dinner Carvedilol (Coreg) 25 mg: one tablet twice daily at breakfast and dinner   CYCLOSPORINE 0.05% OP SINGLE USE 30 11/12/2022 30    Patient declined the following medications this month: ***  {refills needed:25320}  {Delivery XNTZ:00174}   Any concerns about your medications? {yes/no:20286}  How often do you forget or accidentally miss a dose? {Missed doses:25554}  Do you use a pillbox? {yes/no:20286}  Is patient in packaging {yes/no:20286}  If yes  What is the date on your next pill pack?  Any concerns or issues with your packaging?   Recent blood pressure readings are as  follows:***  Recent blood glucose readings are as follows:***   Chart review: Recent office visits:  09/05/22 [name], NP - Patient presented for Essential hypertension and other concerns. No medication changes.   Recent consult visits:  11/06/22 Jari Pigg [facility]) - Patient presented for Multiple lesion check. No medication changes.    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 No medication changes.   Hospital visits:  None in previous 6 months  Medications: Outpatient Encounter Medications as of 11/22/2022  Medication Sig   albuterol (VENTOLIN HFA) 108 (90 Base) MCG/ACT inhaler INHALE 1-2 PUFFS into lings EVERY 4 HOURS AS NEEDED   amLODipine (NORVASC) 5 MG tablet TAKE ONE TABLET BY MOUTH ONCE DAILY   blood glucose meter kit and supplies Dispense based on patient and insurance preference. Use up to four times daily as directed. (FOR ICD-10 E10.9, E11.9).   budesonide (RHINOCORT AQUA) 32 MCG/ACT nasal spray Place 1 spray into both nostrils daily as needed for allergies.    carvedilol (COREG) 25 MG tablet TAKE ONE TABLET BY MOUTH EVERY MORNING and TAKE ONE TABLET BY MOUTH EVERY EVENING   Cholecalciferol (VITAMIN D) 50 MCG (2000 UT) tablet Take 2,000 Units by mouth daily.    citalopram (CELEXA) 10 MG tablet TAKE 1/2 TABLET BY MOUTH EVERY MORNING   Continuous Blood Gluc Sensor (FREESTYLE LIBRE 2 SENSOR) MISC USE TO check blood glucose AND CHANGE sensor every 14 DAYS   cycloSPORINE (RESTASIS) 0.05 % ophthalmic emulsion Place 1 drop into both eyes 2 (two) times daily.   DOTTI 0.05  MG/24HR patch PLACE ONE PATCH onto THE SKIN TWICE WEEKLY   famotidine (PEPCID) 20 MG tablet TAKE ONE TABLET BY MOUTH EVERYDAY AT BEDTIME   Fluticasone-Umeclidin-Vilant (TRELEGY ELLIPTA) 200-62.5-25 MCG/ACT AEPB INHALE 1 PUFF BY MOUTH INTO LUNGS DAILY   hydrochlorothiazide (HYDRODIURIL) 25 MG tablet TAKE ONE TABLET BY MOUTH EVERY  MORNING   irbesartan (AVAPRO) 150 MG tablet TAKE ONE TABLET BY MOUTH TWICE DAILY   Lancets (ONETOUCH DELICA PLUS [id number]) MISC USE TO check blood glucose UP TO four times daily AS DIRECTED   levothyroxine (SYNTHROID) 50 MCG tablet TAKE ONE TABLET BY MOUTH EVERY MORNING   metFORMIN (GLUCOPHAGE) 500 MG tablet TAKE ONE TABLET BY MOUTH TWICE DAILY WITH A MEAL   minoxidil (ROGAINE) 2 % external solution Apply topically daily.   Multiple Vitamins-Minerals (ALIVE WOMENS GUMMY PO) Take 1 tablet by mouth daily.   multivitamin-lutein (OCUVITE-LUTEIN) CAPS capsule Take 1 capsule by mouth daily.   ondansetron (ZOFRAN) 4 MG tablet Take 1 tablet (4 mg total) by mouth as directed.   ONETOUCH ULTRA test strip USE TO check blood glucose UP TO four times daily AS DIRECTED   potassium chloride (KLOR-CON) 10 MEQ tablet TAKE ONE TABLET BY MOUTH EVERY EVENING   rosuvastatin (CRESTOR) 10 MG tablet TAKE ONE TABLET BY MOUTH every other evening   triamcinolone (NASACORT) 55 MCG/ACT AERO nasal inhaler INSTILL 1 SPRAY ONCE DAILY   TURMERIC PO Take 500 mg by mouth daily.    No facility-administered encounter medications on file as of 11/22/2022.   BP Readings from Last 3 Encounters:  09/24/22 108/69  09/05/22 132/84  06/03/22 124/78    Pulse Readings from Last 3 Encounters:  09/24/22 65  09/05/22 60  06/03/22 (!) 52    Lab Results  Component Value Date/Time   HGBA1C 5.9 (A) 09/05/2022 09:47 AM   HGBA1C 6.9 (H) 05/15/2022 09:56 AM   HGBA1C 6.3 (A) 11/06/2021 10:32 AM   HGBA1C 6.6 (H) 05/02/2021 10:12 AM   Lab Results  Component Value Date   CREATININE 0.89 05/15/2022   BUN 19 05/15/2022   GFR 65.26 05/15/2022   GFRNONAA 62 06/03/2018   GFRAA 72 06/03/2018   NA 138 05/15/2022   K 3.7 05/15/2022   CALCIUM 10.0 05/15/2022   CO2 27 05/15/2022   Care Gaps: Diabetic Urine - Overdue Zoster Vaccine - Overdue [place] Vaccine - Overdue TDAP - Overdue BP- 124/78 06/03/22 AWV- 04/09/22  Star Rating  Drugs: Ibesartan (Avapro) 150 mg - Last filled 08/30/22 90 DS at [facility] Rosuvastatin (Crestor) 10 mg - Last filled 08/30/22 90 DS at [facility] Metformin (Glucophage) 500 mg - Last filled 08/30/22 90 DS at  [place] Pharmacist Assistant [phone]

## 2022-12-03 ENCOUNTER — Other Ambulatory Visit: Payer: Self-pay | Admitting: Allergy and Immunology

## 2022-12-03 ENCOUNTER — Other Ambulatory Visit: Payer: Self-pay | Admitting: Adult Health

## 2022-12-04 ENCOUNTER — Ambulatory Visit: Payer: PPO | Admitting: Adult Health

## 2022-12-10 ENCOUNTER — Ambulatory Visit (INDEPENDENT_AMBULATORY_CARE_PROVIDER_SITE_OTHER): Payer: PPO | Admitting: Adult Health

## 2022-12-10 ENCOUNTER — Encounter: Payer: Self-pay | Admitting: Adult Health

## 2022-12-10 VITALS — BP 120/70 | HR 59 | Temp 97.7°F | Ht 63.75 in | Wt 193.0 lb

## 2022-12-10 DIAGNOSIS — I10 Essential (primary) hypertension: Secondary | ICD-10-CM | POA: Diagnosis not present

## 2022-12-10 DIAGNOSIS — M542 Cervicalgia: Secondary | ICD-10-CM

## 2022-12-10 DIAGNOSIS — E118 Type 2 diabetes mellitus with unspecified complications: Secondary | ICD-10-CM

## 2022-12-10 LAB — POCT GLYCOSYLATED HEMOGLOBIN (HGB A1C): Hemoglobin A1C: 5.8 % — AB (ref 4.0–5.6)
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
## 2022-12-16 ENCOUNTER — Ambulatory Visit (INDEPENDENT_AMBULATORY_CARE_PROVIDER_SITE_OTHER)
Admission: RE | Admit: 2022-12-16 | Discharge: 2022-12-16 | Disposition: A | Discharge status: Home / Self Care | Payer: PPO | Source: Ambulatory Visit | Attending: Adult Health | Admitting: Adult Health

## 2022-12-16 DIAGNOSIS — R202 Paresthesia of skin: Secondary | ICD-10-CM | POA: Diagnosis not present

## 2022-12-16 DIAGNOSIS — M542 Cervicalgia: Secondary | ICD-10-CM

## 2022-12-16 DIAGNOSIS — M4312 Spondylolisthesis, cervical region: Secondary | ICD-10-CM | POA: Diagnosis not present

## 2022-12-19 ENCOUNTER — Other Ambulatory Visit: Payer: Self-pay | Admitting: Adult Health

## 2022-12-19 ENCOUNTER — Telehealth: Payer: Self-pay | Admitting: Adult Health

## 2022-12-19 DIAGNOSIS — M542 Cervicalgia: Secondary | ICD-10-CM

## 2022-12-19 NOTE — Telephone Encounter (Signed)
Patient returning call.

## 2022-12-19 NOTE — Telephone Encounter (Signed)
Spoke to pt and advised that the vm she heard was old. Pt and I spoke after the vm was left. Pt verbalized understanding. No further action needed!

## 2022-12-24 ENCOUNTER — Ambulatory Visit: Payer: PPO | Admitting: Allergy and Immunology

## 2022-12-24 VITALS — BP 110/70 | HR 70 | Temp 98.7°F | Resp 16 | Ht 63.78 in | Wt 196.5 lb

## 2022-12-24 DIAGNOSIS — K219 Gastro-esophageal reflux disease without esophagitis: Secondary | ICD-10-CM | POA: Diagnosis not present

## 2022-12-24 DIAGNOSIS — H6993 Unspecified Eustachian tube disorder, bilateral: Secondary | ICD-10-CM | POA: Diagnosis not present

## 2022-12-24 DIAGNOSIS — J3089 Other allergic rhinitis: Secondary | ICD-10-CM

## 2022-12-24 DIAGNOSIS — J4540 Moderate persistent asthma, uncomplicated: Secondary | ICD-10-CM

## 2022-12-24 MED ORDER — TRELEGY ELLIPTA 200-62.5-25 MCG/ACT IN AEPB: 1.0000 | INHALATION_SPRAY | Freq: Every morning | RESPIRATORY_TRACT | 3 refills | Status: DC

## 2022-12-24 MED ORDER — FEXOFENADINE HCL 180 MG PO TABS: 180.0000 mg | ORAL_TABLET | Freq: Every day | ORAL | 3 refills | Status: DC | PRN

## 2022-12-24 MED ORDER — TRIAMCINOLONE ACETONIDE 55 MCG/ACT NA AERO: 1.0000 | INHALATION_SPRAY | Freq: Every day | NASAL | 3 refills | Status: DC

## 2022-12-24 MED ORDER — ALBUTEROL SULFATE HFA 108 (90 BASE) MCG/ACT IN AERS: 2.0000 | INHALATION_SPRAY | RESPIRATORY_TRACT | 1 refills | Status: DC | PRN

## 2022-12-24 MED ORDER — FAMOTIDINE 20 MG PO TABS: 20.0000 mg | ORAL_TABLET | Freq: Every morning | ORAL | 3 refills | Status: DC

## 2022-12-24 NOTE — Progress Notes (Signed)
[place] - [place] - [place] - [place] - [place]   Follow-up Note  Referring Provider: [name], NP Primary Provider: [name], NP Date of Office Visit: 12/24/2022  Subjective:   Amber Brewer (DOB: April 09, 1951) is a 72 y.o. female who [facility] in re-evaluation of the following:  HPI: Opie returns to this clinic in evaluation of asthma, allergic rhinitis, LPR.  I last saw her in this clinic 25 December 2021.  She was really doing very well until she contracted several viral respiratory tract infections.  She contracted an infection in July and August and this December.  Her infections in July and August were accompanied by a fever and she was quite sick for her August event and required what sounds like 2 different antibiotics for issues with coughing and wheezing and nasal symptoms.  This December event appeared to be mostly just some cough and some wheeze for which she used her short acting bronchodilator and most of that reaction resolved within a few weeks.  Other than these viral respiratory tract infection she has really done well and has not required a systemic steroid to treat an exacerbation and rarely uses a short acting bronchodilator and has had very little problems with her nose while using Trelegy and Nasacort.  She has noticed since her event this winter that she has had some intermittent ear popping and some squishy noises in her ear.  She has had no associated dizziness or hearing loss.  Her reflux is under excellent control while using famotidine.  She did obtain a flu vaccine.  Allergies as of 12/24/2022       Reactions   Codeine Phosphate Nausea And Vomiting   REACTION: unspecified   Prednisone Shortness Of Breath   Betadine [povidone Iodine] Hives   After surgical skin prep   Sulfa Antibiotics Nausea And Vomiting   Iodine Rash   Broke out on skin once years ago--**Betadine Skin Prep**         Medication List    albuterol 108 (90 Base) MCG/ACT inhaler Commonly known as: VENTOLIN HFA INHALE 1-2 PUFFS into lings EVERY 4 HOURS AS NEEDED   amLODipine 5 MG tablet Commonly known as: NORVASC TAKE ONE TABLET BY MOUTH ONCE DAILY   blood glucose meter kit and supplies Dispense based on patient and insurance preference. Use up to four times daily as directed. (FOR ICD-10 E10.9, E11.9).   budesonide 32 MCG/ACT nasal spray Commonly known as: RHINOCORT AQUA Place 1 spray into both nostrils daily as needed for allergies.   calcium carbonate 1500 (600 Ca) MG Tabs tablet Commonly known as: OSCAL Take by mouth 2 (two) times daily with a meal.   carvedilol 25 MG tablet Commonly known as: COREG TAKE ONE TABLET BY MOUTH EVERY MORNING and TAKE ONE TABLET BY MOUTH EVERY EVENING   citalopram 10 MG tablet Commonly known as: CELEXA TAKE 1/2 TABLET BY MOUTH EVERY MORNING   cycloSPORINE 0.05 % ophthalmic emulsion Commonly known as: RESTASIS Place 1 drop into both eyes 2 (two) times daily.   Dotti 0.05 MG/24HR patch Generic drug: estradiol PLACE ONE PATCH onto THE SKIN TWICE WEEKLY   famotidine 20 MG tablet Commonly known as: PEPCID TAKE ONE TABLET BY MOUTH EVERYDAY AT BEDTIME   FreeStyle Libre 2 Sensor Misc USE TO check blood glucose AND CHANGE sensor every 14 DAYS   hydrochlorothiazide 25 MG tablet Commonly known as: HYDRODIURIL TAKE ONE TABLET BY MOUTH EVERY MORNING  irbesartan 150 MG tablet Commonly known as: AVAPRO TAKE ONE TABLET BY MOUTH TWICE DAILY   levothyroxine 50 MCG tablet Commonly known as: Synthroid TAKE ONE TABLET BY MOUTH EVERY MORNING   metFORMIN 500 MG tablet Commonly known as: GLUCOPHAGE TAKE ONE TABLET BY MOUTH TWICE DAILY WITH A MEAL   minoxidil 2 % external solution Commonly known as: ROGAINE Apply topically daily.   multivitamin-lutein Caps capsule Take 1 capsule by mouth daily.   ALIVE WOMENS GUMMY PO Take 1 tablet by mouth daily.    OneTouch Delica Plus Lancet33G Misc USE TO check blood glucose UP TO four times daily AS DIRECTED   OneTouch Ultra test strip Generic drug: glucose blood USE TO check blood glucose UP TO four times daily AS DIRECTED   potassium chloride 10 MEQ tablet Commonly known as: KLOR-CON TAKE ONE TABLET BY MOUTH EVERY EVENING   rosuvastatin 10 MG tablet Commonly known as: CRESTOR TAKE ONE TABLET BY MOUTH every other evening   Trelegy Ellipta 200-62.5-25 MCG/ACT Aepb Generic drug: Fluticasone-Umeclidin-Vilant INHALE 1 PUFF BY MOUTH INTO LUNGS DAILY   triamcinolone 55 MCG/ACT Aero nasal inhaler Commonly known as: NASACORT INSTILL 1 SPRAY ONCE DAILY   TURMERIC PO Take 500 mg by mouth daily.   Vitamin D 50 MCG (2000 UT) tablet Take 2,000 Units by mouth daily.    Past Medical History:  Diagnosis Date   Allergy    Anemia    age 61 only   Asthma    Bowen's disease    ~ 17 yrs ago   Chicken pox    DDD (degenerative disc disease), cervical    Depression    Diabetes mellitus without complication (HCC)    on metformin - goes back andf forth   Gallstones    GERD (gastroesophageal reflux disease)    Hx of melanoma in situ 2002   Hx: UTI (urinary tract infection)    Hyperlipidemia    Hypertension    Hypothyroidism    IBS (irritable bowel syndrome)    with D   Melanoma (HCC)    x2    Nephrolithiasis    Osteoarthritis    PVC's (premature ventricular contractions)    Status post dilation of esophageal narrowing     Past Surgical History:  Procedure Laterality Date   ABDOMINAL HYSTERECTOMY  1974   APPENDECTOMY  1977   BLADDER SURGERY  1974   BREAST EXCISIONAL BIOPSY     CATARACT EXTRACTION Bilateral 2015   CHOLECYSTECTOMY  1987   COLONOSCOPY     RECTAL SURGERY     ~17 yrs ago- [facility] dx per pt with surgery   removal of melanoma in situ      Review of systems negative except as noted in HPI / PMHx or noted below:  Review of Systems  Constitutional: Negative.   HENT:  Negative.    Eyes: Negative.   Respiratory: Negative.    Cardiovascular: Negative.   Gastrointestinal: Negative.   Genitourinary: Negative.   Musculoskeletal: Negative.   Skin: Negative.   Neurological: Negative.   Endo/Heme/Allergies: Negative.   Psychiatric/Behavioral: Negative.       Objective:   Vitals:   12/24/22 1026  BP: 110/70  Pulse: 70  Resp: 16  Temp: 98.7 F (37.1 C)  SpO2: 96%   Height: 5' 3.78" (162 cm)  Weight: 196 lb 8 oz (89.1 kg)   Physical Exam Constitutional:      Appearance: She is not diaphoretic.  HENT:     Head: Normocephalic.  Right Ear: Tympanic membrane, ear canal and external ear normal.     Left Ear: Tympanic membrane, ear canal and external ear normal.     Nose: Nose normal. No mucosal edema or rhinorrhea.     Mouth/Throat:     Pharynx: Uvula midline. No oropharyngeal exudate.  Eyes:     Conjunctiva/sclera: Conjunctivae normal.  Neck:     Thyroid: No thyromegaly.     Trachea: Trachea normal. No tracheal tenderness or tracheal deviation.  Cardiovascular:     Rate and Rhythm: Normal rate and regular rhythm.     Heart sounds: Normal heart sounds, S1 normal and S2 normal. No murmur heard. Pulmonary:     Effort: No respiratory distress.     Breath sounds: Normal breath sounds. No stridor. No wheezing or rales.  Lymphadenopathy:     Head:     Right side of head: No tonsillar adenopathy.     Left side of head: No tonsillar adenopathy.     Cervical: No cervical adenopathy.  Skin:    Findings: No erythema or rash.     Nails: There is no clubbing.  Neurological:     Mental Status: She is alert.     Diagnostics:    Spirometry was performed and demonstrated an FEV1 of 1.60 at 74 % of predicted.  Assessment and Plan:   1. Asthma, moderate persistent, well-controlled   2. Other allergic rhinitis   3. LPRD (laryngopharyngeal reflux disease)   4. Dysfunction of both eustachian tubes     1. Continue to Treat inflammation:   A.  Trelegy 200 - one inhalation daily   B. Nasacort 1 spray each nostril 1-2 times per day  2. Continue to Treat reflux:   A. Famotidine 20 mg - one time per day  3. If needed:   A. nasal saline wash  B. ProAir HFA 2 puffs every 4-6 hours  C. OTC antihistamine - Claritin/Allegra/Zyrtec  D. OTC Mucinex DM 2 tablets twice a day  E. Positive pressure inflation of ears  4. Return to clinic in 12 months or earlier if problem  He appears to be doing pretty well other than contracting a few viral respiratory tract infections over the course of the past year and we are not going to change any of her therapy at this point in time and she will remain on a selection of anti-inflammatory medications for her airway as well as famotidine for her reflux.  She does appear to have some intermittent eustachian tube dysfunction and she can use positive pressure inflation to deal with that issue.  I will see her back in this clinic in 1 year or earlier if there is a problem.  Laurette Schimke, MD Allergy / Immunology [facility]

## 2022-12-24 NOTE — Patient Instructions (Signed)
    1. Continue to Treat inflammation:   A. Trelegy 200 - one inhalation daily   B. Nasacort 1 spray each nostril 1-2 times per day  2. Continue to Treat reflux:   A. Famotidine 20 mg - one time per day  3. If needed:   A. nasal saline wash  B. ProAir HFA 2 puffs every 4-6 hours  C. OTC antihistamine - Claritin/Allegra/Zyrtec  D. OTC Mucinex DM 2 tablets twice a day  E. Positive pressure inflation of ears  4. Return to clinic in 12 months or earlier if problem

## 2022-12-25 ENCOUNTER — Encounter: Payer: Self-pay | Admitting: Allergy and Immunology

## 2022-12-28 ENCOUNTER — Ambulatory Visit (HOSPITAL_BASED_OUTPATIENT_CLINIC_OR_DEPARTMENT_OTHER)
Admission: RE | Admit: 2022-12-28 | Discharge: 2022-12-28 | Disposition: A | Discharge status: Home / Self Care | Payer: PPO | Source: Ambulatory Visit | Attending: Adult Health | Admitting: Adult Health

## 2022-12-28 DIAGNOSIS — R200 Anesthesia of skin: Secondary | ICD-10-CM | POA: Diagnosis not present

## 2022-12-28 DIAGNOSIS — M542 Cervicalgia: Secondary | ICD-10-CM | POA: Diagnosis not present

## 2022-12-28 DIAGNOSIS — R202 Paresthesia of skin: Secondary | ICD-10-CM | POA: Diagnosis not present

## 2022-12-31 ENCOUNTER — Other Ambulatory Visit: Payer: Self-pay

## 2022-12-31 DIAGNOSIS — M4802 Spinal stenosis, cervical region: Secondary | ICD-10-CM

## 2023-01-02 ENCOUNTER — Telehealth: Payer: Self-pay | Admitting: Adult Health

## 2023-01-02 ENCOUNTER — Other Ambulatory Visit: Payer: Self-pay | Admitting: Adult Health

## 2023-01-02 MED ORDER — NAPROXEN 500 MG PO TABS: 500.0000 mg | ORAL_TABLET | Freq: Two times a day (BID) | ORAL | 0 refills | Status: DC

## 2023-01-02 NOTE — Telephone Encounter (Signed)
Please advise 

## 2023-01-02 NOTE — Telephone Encounter (Signed)
Patient notified of update  and verbalized understanding. 

## 2023-01-02 NOTE — Telephone Encounter (Signed)
Pt would like to let Tommi Rumps know that Ibuprofen is no longer controlling the pain in her neck. Tommi Rumps stated he would send in something for her if she needed it before she can see the Neurosurgeon.  Pt would like Kendra to give her a call back please.    Pharmacy- [facility] on [address].

## 2023-01-13 DIAGNOSIS — M5030 Other cervical disc degeneration, unspecified cervical region: Secondary | ICD-10-CM | POA: Diagnosis not present

## 2023-01-13 DIAGNOSIS — Z6834 Body mass index (BMI) 34.0-34.9, adult: Secondary | ICD-10-CM | POA: Diagnosis not present

## 2023-01-20 DIAGNOSIS — H43812 Vitreous degeneration, left eye: Secondary | ICD-10-CM | POA: Diagnosis not present

## 2023-01-20 DIAGNOSIS — H16223 Keratoconjunctivitis sicca, not specified as Sjogren's, bilateral: Secondary | ICD-10-CM | POA: Diagnosis not present

## 2023-01-20 DIAGNOSIS — H18413 Arcus senilis, bilateral: Secondary | ICD-10-CM | POA: Diagnosis not present

## 2023-01-20 DIAGNOSIS — H43392 Other vitreous opacities, left eye: Secondary | ICD-10-CM | POA: Diagnosis not present

## 2023-01-22 DIAGNOSIS — M542 Cervicalgia: Secondary | ICD-10-CM | POA: Diagnosis not present

## 2023-01-22 DIAGNOSIS — M6281 Muscle weakness (generalized): Secondary | ICD-10-CM | POA: Diagnosis not present

## 2023-01-28 DIAGNOSIS — M542 Cervicalgia: Secondary | ICD-10-CM | POA: Diagnosis not present

## 2023-01-28 DIAGNOSIS — M6281 Muscle weakness (generalized): Secondary | ICD-10-CM | POA: Diagnosis not present

## 2023-01-30 ENCOUNTER — Other Ambulatory Visit: Payer: Self-pay | Admitting: Adult Health

## 2023-01-30 DIAGNOSIS — M542 Cervicalgia: Secondary | ICD-10-CM | POA: Diagnosis not present

## 2023-01-30 DIAGNOSIS — M6281 Muscle weakness (generalized): Secondary | ICD-10-CM | POA: Diagnosis not present

## 2023-02-04 DIAGNOSIS — M6281 Muscle weakness (generalized): Secondary | ICD-10-CM | POA: Diagnosis not present

## 2023-02-04 DIAGNOSIS — M542 Cervicalgia: Secondary | ICD-10-CM | POA: Diagnosis not present

## 2023-02-06 DIAGNOSIS — M542 Cervicalgia: Secondary | ICD-10-CM | POA: Diagnosis not present

## 2023-02-06 DIAGNOSIS — M6281 Muscle weakness (generalized): Secondary | ICD-10-CM | POA: Diagnosis not present

## 2023-02-10 DIAGNOSIS — M542 Cervicalgia: Secondary | ICD-10-CM | POA: Diagnosis not present

## 2023-02-10 DIAGNOSIS — M6281 Muscle weakness (generalized): Secondary | ICD-10-CM | POA: Diagnosis not present

## 2023-02-13 DIAGNOSIS — M542 Cervicalgia: Secondary | ICD-10-CM | POA: Diagnosis not present

## 2023-02-13 DIAGNOSIS — M6281 Muscle weakness (generalized): Secondary | ICD-10-CM | POA: Diagnosis not present

## 2023-02-20 ENCOUNTER — Telehealth: Payer: Self-pay

## 2023-02-20 MED ORDER — ESTRADIOL 0.05 MG/24HR TD PTTW: MEDICATED_PATCH | TRANSDERMAL | 2 refills | Status: DC

## 2023-02-20 NOTE — Telephone Encounter (Signed)
Rx refilled.

## 2023-02-20 NOTE — Progress Notes (Signed)
Patient ID: Amber Brewer, female   DOB: Mar 16, 1951, 72 y.o.   MRN: [id number]  Care Management & Coordination Services Pharmacy Team  Reason for Encounter: Medication coordination and delivery  Contacted patient to discuss medications and coordinate delivery from [facility]. Spoke with patient on 02/20/2023  Cycle dispensing form sent to [facility] for review.   Last adherence delivery date:12/05/22      Patient is due for next adherence delivery on: 03/05/23  This delivery to include: Adherence Packaging  90 Days  Trelegy Inhaler : 1 Puff daily Amlodipine (Norvasc) 5 mg: take one tablet at  breakfast Freestyle Libre 2 Sensors: change every 14 days  Citalopram (celexa) 10 mg:  tablet daily at breakfast   Levothyroxine (synthroid) 50 MCG: one tablet at breakfast Potassium Chloride (Klor-Con) 10 MEQ: one tablet daily at dinner Famotidine (pepcid) 20 mg: one tablet daily at bedtime Vitamin D3 2000 units daily: one tablet daily at dinner Ocuvite Eye Health Adult Multivitamin 50+ 1 capsule daily: one at breakfast Turmeric 500 mg: one tablet daily at dinner Hydrochlorothiazide 25 MG tablet: one tablet at breakfast Rosuvastatin 10 mg: one tablet every other day at dinner Irbesartan (avapro)150 mg: Take 1 tablet by mouth at breakfast and at dinner Metformin 500 mg 1 tablet daily with breakfast and 1 at dinner Carvedilol (Coreg) 25 mg: one tablet twice daily at breakfast and dinner Oyster Shell Calcium -D3 : Take one at Bedtime Trelegy Ellipta 200 mcg-62.5 mcg-25 mcg powder for inhalation 02/05/2023 30   PREDNISOLONE AC 1% OPHTH SUSP  01/21/2023 12     Refills requested from providers include: Dotti estrogen patches 90 DS (PCP)  Confirmed delivery date of 03/05/23, advised patient that pharmacy will contact them the morning of delivery.   Any concerns about your medications? No  How often do you forget or accidentally miss a dose? Never  Do you use a pillbox? No  Is  patient in packaging Yes     Chart review: Recent office visits:  12/10/22 Amber Frees, NP - Patient presented for Type 2 diabetes mellitus with complications and other concerns. No medication changes.   Recent consult visits:  12/24/22 Kozlow, Alvira Philips, MD (Allergy) - Patient presented for Asthma moderate persistent well controlled and other concerns. Prescribed Fexofenadine HCL. Changed Famotidine Timing. Stopped Zofran.   Hospital visits:  None in previous 6 months  Medications: Outpatient Encounter Medications as of 02/20/2023  Medication Sig   albuterol (VENTOLIN HFA) 108 (90 Base) MCG/ACT inhaler Inhale 2 puffs into the lungs every 4 (four) hours as needed for wheezing or shortness of breath.   amLODipine (NORVASC) 5 MG tablet TAKE ONE TABLET BY MOUTH ONCE DAILY   blood glucose meter kit and supplies Dispense based on patient and insurance preference. Use up to four times daily as directed. (FOR ICD-10 E10.9, E11.9).   budesonide (RHINOCORT AQUA) 32 MCG/ACT nasal spray Place 1 spray into both nostrils daily as needed for allergies.    calcium carbonate (OSCAL) 1500 (600 Ca) MG TABS tablet Take by mouth 2 (two) times daily with a meal.   carvedilol (COREG) 25 MG tablet TAKE ONE TABLET BY MOUTH EVERY MORNING and TAKE ONE TABLET BY MOUTH EVERY EVENING   Cholecalciferol (VITAMIN D) 50 MCG (2000 UT) tablet Take 2,000 Units by mouth daily.    citalopram (CELEXA) 10 MG tablet TAKE 1/2 TABLET BY MOUTH EVERY MORNING   Continuous Blood Gluc Sensor (FREESTYLE LIBRE 2 SENSOR) MISC USE TO check blood glucose AND CHANGE  sensor every 14 DAYS   cycloSPORINE (RESTASIS) 0.05 % ophthalmic emulsion Place 1 drop into both eyes 2 (two) times daily.   DOTTI 0.05 MG/24HR patch PLACE ONE PATCH onto THE SKIN TWICE WEEKLY   famotidine (PEPCID) 20 MG tablet Take 1 tablet (20 mg total) by mouth in the morning.   fexofenadine (ALLEGRA) 180 MG tablet Take 1 tablet (180 mg total) by mouth daily as needed for  allergies or rhinitis (Can take an extra dose during flare ups.).   Fluticasone-Umeclidin-Vilant (TRELEGY ELLIPTA) 200-62.5-25 MCG/ACT AEPB Inhale 1 puff into the lungs in the morning.   hydrochlorothiazide (HYDRODIURIL) 25 MG tablet TAKE ONE TABLET BY MOUTH EVERY MORNING   irbesartan (AVAPRO) 150 MG tablet TAKE ONE TABLET BY MOUTH TWICE DAILY   Lancets (ONETOUCH DELICA PLUS LANCET33G) MISC USE TO check blood glucose UP TO four times daily AS DIRECTED   levothyroxine (SYNTHROID) 50 MCG tablet TAKE ONE TABLET BY MOUTH EVERY MORNING   metFORMIN (GLUCOPHAGE) 500 MG tablet TAKE ONE TABLET BY MOUTH TWICE DAILY WITH A MEAL   minoxidil (ROGAINE) 2 % external solution Apply topically daily.   Multiple Vitamins-Minerals (ALIVE WOMENS GUMMY PO) Take 1 tablet by mouth daily.   multivitamin-lutein (OCUVITE-LUTEIN) CAPS capsule Take 1 capsule by mouth daily.   naproxen (NAPROSYN) 500 MG tablet Take 1 tablet (500 mg total) by mouth 2 (two) times daily with a meal.   ONETOUCH ULTRA test strip USE TO check blood glucose UP TO four times daily AS DIRECTED   potassium chloride (KLOR-CON) 10 MEQ tablet TAKE ONE TABLET BY MOUTH EVERY EVENING   rosuvastatin (CRESTOR) 10 MG tablet TAKE ONE TABLET BY MOUTH every other evening   triamcinolone (NASACORT) 55 MCG/ACT AERO nasal inhaler Place 1 spray into the nose daily.   TURMERIC PO Take 500 mg by mouth daily.    No facility-administered encounter medications on file as of 02/20/2023.   BP Readings from Last 3 Encounters:  12/24/22 110/70  12/10/22 120/70  09/24/22 108/69    Pulse Readings from Last 3 Encounters:  12/24/22 70  12/10/22 (!) 59  09/24/22 65    Lab Results  Component Value Date/Time   HGBA1C 5.8 (A) 12/10/2022 02:12 PM   HGBA1C 5.9 (A) 09/05/2022 09:47 AM   HGBA1C 6.9 (H) 05/15/2022 09:56 AM   HGBA1C 6.6 (H) 05/02/2021 10:12 AM   Lab Results  Component Value Date   CREATININE 0.89 05/15/2022   BUN 19 05/15/2022   GFR 65.26 05/15/2022    GFRNONAA 62 06/03/2018   GFRAA 72 06/03/2018   NA 138 05/15/2022   K 3.7 05/15/2022   CALCIUM 10.0 05/15/2022   CO2 27 05/15/2022       Pamala Duffel CMA Clinical Pharmacist Assistant [phone]

## 2023-02-20 NOTE — Telephone Encounter (Signed)
-----   Message from Angela H Herring, RPH sent at 02/20/2023 10:39 AM EDT ----- Regarding: Med Refill Rquest [facility] requesting refill on the behalf of patient for the following medication:  DOTTI 0.05 MG/24HR patch Apply 1 patch to skin twice weekly as directed  Quantity: 24 (insurance prefers 90DS)  Pharmacy Info: [facility] - [place], [place] - [address]  Phone: [phone] Fax: [phone]   Thank you! Angela H Herring Clinical Pharmacist [phone]   

## 2023-02-20 NOTE — Telephone Encounter (Signed)
-----   Message from Sherrill Raring, [facility] sent at 02/20/2023 10:39 AM EDT ----- Regarding: Med Refill Rquest [facility] requesting refill on the behalf of patient for the following medication:  DOTTI 0.05 MG/24HR patch Apply 1 patch to skin twice weekly as directed  Quantity: 24 (insurance prefers 90DS)  Pharmacy Info: [facility] - [place], [place] - [address]  Phone: [phone] Fax: [phone]   Thank you! Sherrill Raring Clinical Pharmacist [phone]

## 2023-02-24 DIAGNOSIS — M4802 Spinal stenosis, cervical region: Secondary | ICD-10-CM | POA: Diagnosis not present

## 2023-02-24 DIAGNOSIS — Z6834 Body mass index (BMI) 34.0-34.9, adult: Secondary | ICD-10-CM | POA: Diagnosis not present

## 2023-03-03 DIAGNOSIS — M6281 Muscle weakness (generalized): Secondary | ICD-10-CM | POA: Diagnosis not present

## 2023-03-03 DIAGNOSIS — M542 Cervicalgia: Secondary | ICD-10-CM | POA: Diagnosis not present

## 2023-03-06 DIAGNOSIS — M6281 Muscle weakness (generalized): Secondary | ICD-10-CM | POA: Diagnosis not present

## 2023-03-06 DIAGNOSIS — M542 Cervicalgia: Secondary | ICD-10-CM | POA: Diagnosis not present

## 2023-03-11 DIAGNOSIS — M6281 Muscle weakness (generalized): Secondary | ICD-10-CM | POA: Diagnosis not present

## 2023-03-11 DIAGNOSIS — M542 Cervicalgia: Secondary | ICD-10-CM | POA: Diagnosis not present

## 2023-03-13 DIAGNOSIS — M6281 Muscle weakness (generalized): Secondary | ICD-10-CM | POA: Diagnosis not present

## 2023-03-13 DIAGNOSIS — M542 Cervicalgia: Secondary | ICD-10-CM | POA: Diagnosis not present

## 2023-03-24 DIAGNOSIS — M542 Cervicalgia: Secondary | ICD-10-CM | POA: Diagnosis not present

## 2023-03-24 DIAGNOSIS — M6281 Muscle weakness (generalized): Secondary | ICD-10-CM | POA: Diagnosis not present

## 2023-03-26 DIAGNOSIS — M542 Cervicalgia: Secondary | ICD-10-CM | POA: Diagnosis not present

## 2023-03-26 DIAGNOSIS — M6281 Muscle weakness (generalized): Secondary | ICD-10-CM | POA: Diagnosis not present

## 2023-03-31 DIAGNOSIS — M542 Cervicalgia: Secondary | ICD-10-CM | POA: Diagnosis not present

## 2023-03-31 DIAGNOSIS — M6281 Muscle weakness (generalized): Secondary | ICD-10-CM | POA: Diagnosis not present

## 2023-04-02 ENCOUNTER — Other Ambulatory Visit: Payer: Self-pay | Admitting: Adult Health

## 2023-04-02 DIAGNOSIS — Z1231 Encounter for screening mammogram for malignant neoplasm of breast: Secondary | ICD-10-CM

## 2023-04-09 ENCOUNTER — Ambulatory Visit (INDEPENDENT_AMBULATORY_CARE_PROVIDER_SITE_OTHER): Payer: PPO

## 2023-04-09 ENCOUNTER — Encounter: Payer: Self-pay | Admitting: Family Medicine

## 2023-04-09 ENCOUNTER — Ambulatory Visit (INDEPENDENT_AMBULATORY_CARE_PROVIDER_SITE_OTHER): Payer: PPO | Admitting: Family Medicine

## 2023-04-09 VITALS — BP 126/70 | HR 70 | Temp 98.2°F | Resp 16 | Ht 63.78 in | Wt 196.1 lb

## 2023-04-09 DIAGNOSIS — R1011 Right upper quadrant pain: Secondary | ICD-10-CM

## 2023-04-09 DIAGNOSIS — E118 Type 2 diabetes mellitus with unspecified complications: Secondary | ICD-10-CM

## 2023-04-09 DIAGNOSIS — Z7984 Long term (current) use of oral hypoglycemic drugs: Secondary | ICD-10-CM

## 2023-04-09 NOTE — Patient Instructions (Signed)
A few things to remember from today's visit:  RUQ abdominal pain - Plan: Comprehensive metabolic panel, Urinalysis, Routine w reflex microscopic, DG Abd 2 Views  Gastroesophageal reflux disease, unspecified whether esophagitis present  Continue monitoring for new symptoms. If fever,sudden worsening pain, or nausea/vomiting seek immediate medical attention.  If you need refills for medications you take chronically, please call your pharmacy. Do not use My Chart to request refills or for acute issues that need immediate attention. If you send a my chart message, it may take a few days to be addressed, specially if I am not in the office.  Please be sure medication list is accurate. If a new problem present, please set up appointment sooner than planned today.

## 2023-04-09 NOTE — Progress Notes (Signed)
ACUTE VISIT Chief Complaint  Patient presents with   Flank Pain    Right side started yesterday around 4pm, was worse over night. Hurts more with deep breaths.    HPI: Ms.Amber Brewer is a 72 y.o. female with PMHx significant for DM II,nephrolithiasis,hypothyroidism,anxiety,HTN, and HLD here today complaining of right-sided abdominal pain and around her waist area. The pain began yesterday afternoon and worsened by night, but has slightly improved this morning. She denies any prior history of similar pain or back pain and reports no numbness or tingling in the affected area.  The pain is constant but intensifies when taking a deep breath. She has a history of asthma and recently experienced a severe cough, although she does not recall pulling a muscle while doing so. It has improved.  She denies any accompanying nausea or vomiting. However, she reports a change in her bowel habits over the last month, with more frequent bowel movements throughout the day and a different stool shape.   The pain does not change with eating or not eating, and there is no associated heartburn or burping. Passing gas and defecation do not alleviate the pain.  It is exacerbated by certain movements and deep breathing. She denies urinary symptoms.  Abdominal Pain This is a new problem. The current episode started yesterday. The problem occurs constantly. The problem has been gradually improving. The pain is located in the RUQ. The pain is at a severity of 8/10. The quality of the pain is dull. The abdominal pain does not radiate. Pertinent negatives include no anorexia, belching, dysuria, fever, flatus, frequency, headaches, hematochezia, hematuria, melena, nausea, vomiting or weight loss. The pain is relieved by Nothing. She has tried nothing for the symptoms. Her past medical history is significant for abdominal surgery and GERD.   She rates her current pain as 3 or 4 out of 10, but last night it was a 7 or 8.  She took one Gas-X, but it did not make a difference.   She denies any heartburn and is currently taking Pepcid 20 mg daily. + Bloating sensation.  DM II: She has experienced "irregular" glucose levels, "high and low."  Lab Results  Component Value Date   HGBA1C 5.8 (A) 12/10/2022  She is on Metformin 500 mg bid.  She underwent a colonoscopy 03/2022, during which one polyp was found. She also has diverticulosis and s/p cholecystectomy and appendicectomy.  Lab Results  Component Value Date   CREATININE 0.89 05/15/2022   BUN 19 05/15/2022   NA 138 05/15/2022   K 3.7 05/15/2022   CL 102 05/15/2022   CO2 27 05/15/2022   Lab Results  Component Value Date   ALT 22 05/15/2022   AST 22 05/15/2022   ALKPHOS 94 05/15/2022   BILITOT 0.7 05/15/2022   Review of Systems  Constitutional:  Negative for appetite change, fever and weight loss.  Respiratory:  Negative for shortness of breath and wheezing.   Cardiovascular:  Negative for chest pain, palpitations and leg swelling.  Gastrointestinal:  Positive for abdominal pain. Negative for anorexia, flatus, hematochezia, melena, nausea and vomiting.  Endocrine: Negative for cold intolerance and heat intolerance.  Genitourinary:  Negative for dysuria, frequency, hematuria, vaginal bleeding and vaginal discharge.  Skin:  Negative for rash.  Neurological:  Negative for syncope and headaches.  See other pertinent positives and negatives in HPI.  Current Outpatient Medications on File Prior to Visit  Medication Sig Dispense Refill   albuterol (VENTOLIN HFA) 108 (90 Base)  MCG/ACT inhaler Inhale 2 puffs into the lungs every 4 (four) hours as needed for wheezing or shortness of breath. 18 g 1   amLODipine (NORVASC) 5 MG tablet TAKE ONE TABLET BY MOUTH ONCE DAILY 90 tablet 3   blood glucose meter kit and supplies Dispense based on patient and insurance preference. Use up to four times daily as directed. (FOR ICD-10 E10.9, E11.9). 1 each 0    budesonide (RHINOCORT AQUA) 32 MCG/ACT nasal spray Place 1 spray into both nostrils daily as needed for allergies.      calcium carbonate (OSCAL) 1500 (600 Ca) MG TABS tablet Take by mouth 2 (two) times daily with a meal.     carvedilol (COREG) 25 MG tablet TAKE ONE TABLET BY MOUTH EVERY MORNING and TAKE ONE TABLET BY MOUTH EVERY EVENING 180 tablet 3   Cholecalciferol (VITAMIN D) 50 MCG (2000 UT) tablet Take 2,000 Units by mouth daily.      citalopram (CELEXA) 10 MG tablet TAKE 1/2 TABLET BY MOUTH EVERY MORNING 45 tablet 11   Continuous Blood Gluc Sensor (FREESTYLE LIBRE 2 SENSOR) MISC USE TO check blood glucose AND CHANGE sensor every 14 DAYS 2 each 11   cycloSPORINE (RESTASIS) 0.05 % ophthalmic emulsion Place 1 drop into both eyes 2 (two) times daily.     estradiol (DOTTI) 0.05 MG/24HR patch PLACE ONE PATCH onto THE SKIN TWICE WEEKLY 8 patch 2   famotidine (PEPCID) 20 MG tablet Take 1 tablet (20 mg total) by mouth in the morning. 90 tablet 3   fexofenadine (ALLEGRA) 180 MG tablet Take 1 tablet (180 mg total) by mouth daily as needed for allergies or rhinitis (Can take an extra dose during flare ups.). 180 tablet 3   Fluticasone-Umeclidin-Vilant (TRELEGY ELLIPTA) 200-62.5-25 MCG/ACT AEPB Inhale 1 puff into the lungs in the morning. 180 each 3   hydrochlorothiazide (HYDRODIURIL) 25 MG tablet TAKE ONE TABLET BY MOUTH EVERY MORNING 90 tablet 2   irbesartan (AVAPRO) 150 MG tablet TAKE ONE TABLET BY MOUTH TWICE DAILY 180 tablet 3   Lancets (ONETOUCH DELICA PLUS LANCET33G) MISC USE TO check blood glucose UP TO four times daily AS DIRECTED 100 each 0   levothyroxine (SYNTHROID) 50 MCG tablet TAKE ONE TABLET BY MOUTH EVERY MORNING 90 tablet 2   metFORMIN (GLUCOPHAGE) 500 MG tablet TAKE ONE TABLET BY MOUTH TWICE DAILY WITH A MEAL 180 tablet 11   minoxidil (ROGAINE) 2 % external solution Apply topically daily.     Multiple Vitamins-Minerals (ALIVE WOMENS GUMMY PO) Take 1 tablet by mouth daily.      multivitamin-lutein (OCUVITE-LUTEIN) CAPS capsule Take 1 capsule by mouth daily.     naproxen (NAPROSYN) 500 MG tablet Take 1 tablet (500 mg total) by mouth 2 (two) times daily with a meal. 60 tablet 0   ONETOUCH ULTRA test strip USE TO check blood glucose UP TO four times daily AS DIRECTED 100 strip 0   potassium chloride (KLOR-CON) 10 MEQ tablet TAKE ONE TABLET BY MOUTH EVERY EVENING 90 tablet 2   rosuvastatin (CRESTOR) 10 MG tablet TAKE ONE TABLET BY MOUTH every other evening 90 tablet 2   triamcinolone (NASACORT) 55 MCG/ACT AERO nasal inhaler Place 1 spray into the nose daily. 51 mL 3   TURMERIC PO Take 500 mg by mouth daily.      No current facility-administered medications on file prior to visit.   Past Medical History:  Diagnosis Date   Allergy    Anemia    age 6 only  Asthma    Bowen's disease    ~ 17 yrs ago   Chicken pox    DDD (degenerative disc disease), cervical    Depression    Diabetes mellitus without complication (HCC)    on metformin - goes back andf forth   Gallstones    GERD (gastroesophageal reflux disease)    Hx of melanoma in situ 2002   Hx: UTI (urinary tract infection)    Hyperlipidemia    Hypertension    Hypothyroidism    IBS (irritable bowel syndrome)    with D   Melanoma (HCC)    x2    Nephrolithiasis    Osteoarthritis    PVC's (premature ventricular contractions)    Status post dilation of esophageal narrowing    Allergies  Allergen Reactions   Codeine Phosphate Nausea And Vomiting    REACTION: unspecified   Prednisone Shortness Of Breath   Betadine [Povidone Iodine] Hives    After surgical skin prep   Sulfa Antibiotics Nausea And Vomiting   Iodine Rash    Broke out on skin once years ago--**Betadine Skin Prep**    Social History   Socioeconomic History   Marital status: Married    Spouse name: Not on file   Number of children: 2   Years of education: 13   Highest education level: Not on file  Occupational History    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  Tobacco Use   Smoking status: Never   Smokeless tobacco: Never  Vaping Use   Vaping Use: Never used  Substance and Sexual Activity   Alcohol use: No   Drug use: No   Sexual activity: Yes    Birth control/protection: Post-menopausal, Surgical  Other Topics Concern   Not on file  Social History Narrative   epworth sleepiness scale = 8 (01/18/2016)   Lives at home with her husband and disabled son   Right handed   Caffeine: decaf   Social Determinants of Health   Financial Resource Strain: Low Risk  (04/09/2022)   Overall Financial Resource Strain (CARDIA)    Difficulty of Paying Living Expenses: Not hard at all  Food Insecurity: No Food Insecurity (04/09/2022)   Hunger Vital Sign    Worried About Running Out of Food in the Last Year: Never true    Ran Out of Food in the Last Year: Never true  Transportation Needs: No Transportation Needs (04/09/2022)   PRAPARE - Administrator, Civil Service (Medical): No    Lack of Transportation (Non-Medical): No  Physical Activity: Inactive (04/09/2022)   Exercise Vital Sign    Days of Exercise per Week: 0 days    Minutes of Exercise per Session: 0 min  Stress: No Stress Concern Present (04/09/2022)   Amber Brewer [employer] Occupational [employer] - Occupational Stress Questionnaire    Feeling of Stress : Not at all  Social Connections: Socially Integrated (04/09/2022)   Social Connection and Isolation Panel [NHANES]    Frequency of Communication with Friends and Family: More than three times a week    Frequency of Social Gatherings with Friends and Family: More than three times a week    Attends Religious Services: More than 4 times per year    Active Member of [employer] or Organizations: Yes    Attends Banker Meetings: More than 4 times per year    Marital Status: Married   Vitals:   04/09/23 1441  BP: 126/70  Pulse: 70  Resp: 16   Temp: 98.2 F (36.8 C)  SpO2: 96%   Body mass index is 33.9 kg/m.  Physical Exam Vitals and nursing note reviewed.  Constitutional:      General: She is not in acute distress.    Appearance: She is well-developed.  HENT:     Head: Normocephalic and atraumatic.     Mouth/Throat:     Mouth: Mucous membranes are moist.     Pharynx: Oropharynx is clear.  Eyes:     Conjunctiva/sclera: Conjunctivae normal.  Cardiovascular:     Rate and Rhythm: Normal rate and regular rhythm.     Heart sounds: No murmur heard.    Comments: Trace pitting LE edema, bilateral. Pulmonary:     Effort: Pulmonary effort is normal. No respiratory distress.     Breath sounds: Normal breath sounds.  Abdominal:     Palpations: Abdomen is soft. There is no hepatomegaly or mass.     Tenderness: There is abdominal tenderness in the right upper quadrant. There is no guarding or rebound.  Lymphadenopathy:     Cervical: No cervical adenopathy.  Skin:    General: Skin is warm.     Findings: No erythema or rash.  Neurological:     General: No focal deficit present.     Mental Status: She is alert and oriented to person, place, and time.     Cranial Nerves: No cranial nerve deficit.     Gait: Gait normal.  Psychiatric:        Mood and Affect: Mood and affect normal.   ASSESSMENT AND PLAN:  Ms. Synnott was seen today for right-sided abdominal pain.  RUQ abdominal pain We discussed possible etiologies. Hx and examination do not suggest a serious process. ? Dyspepsia, nephrolithiasis,musculoskeletal among some to consider. Pain has improved. Monitor for new symptoms. Instructed about warning signs. Further recommendations according to lab results.  -     Comprehensive metabolic panel; Future -     Urinalysis, Routine w reflex microscopic; Future -     DG Abd 2 Views; Future -     CBC; Future  Type 2 diabetes mellitus with complications (HCC) Last HgA1C 5.8. Continue monitoring BS's. Keep f/u appt  with PCP.  I spent a total of 31 minutes in both face to face and non face to face activities for this visit on the date of this encounter. During this time history was obtained and documented, examination was performed, prior labs reviewed, and assessment/plan discussed.  Return if symptoms worsen or fail to improve.  Brezlyn G. [place], MD  [facility]. [place] office.

## 2023-04-10 LAB — URINALYSIS, ROUTINE W REFLEX MICROSCOPIC
Bilirubin Urine: NEGATIVE
Hgb urine dipstick: NEGATIVE
Ketones, ur: NEGATIVE
Leukocytes,Ua: NEGATIVE
Nitrite: NEGATIVE
Specific Gravity, Urine: 1.025 (ref 1.000–1.030)
Total Protein, Urine: NEGATIVE
Urine Glucose: NEGATIVE
Urobilinogen, UA: 0.2 (ref 0.0–1.0)
pH: 5.5 (ref 5.0–8.0)

## 2023-04-10 LAB — COMPREHENSIVE METABOLIC PANEL
ALT: 21 U/L (ref 0–35)
AST: 22 U/L (ref 0–37)
Albumin: 4.3 g/dL (ref 3.5–5.2)
Alkaline Phosphatase: 83 U/L (ref 39–117)
BUN: 20 mg/dL (ref 6–23)
CO2: 25 mEq/L (ref 19–32)
Calcium: 9.9 mg/dL (ref 8.4–10.5)
Chloride: 101 mEq/L (ref 96–112)
Creatinine, Ser: 1 mg/dL (ref 0.40–1.20)
GFR: 56.39 mL/min — ABNORMAL LOW (ref >60.00)
Glucose, Bld: 118 mg/dL — ABNORMAL HIGH (ref 70–99)
Potassium: 3.7 mEq/L (ref 3.5–5.1)
Sodium: 139 mEq/L (ref 135–145)
Total Bilirubin: 0.4 mg/dL (ref 0.2–1.2)
Total Protein: 7.5 g/dL (ref 6.0–8.3)

## 2023-04-10 LAB — CBC
HCT: 38.1 % (ref 36.0–46.0)
Hemoglobin: 12.5 g/dL (ref 12.0–15.0)
MCHC: 32.7 g/dL (ref 30.0–36.0)
MCV: 90.1 fl (ref 78.0–100.0)
Platelets: 240 10*3/uL (ref 150.0–400.0)
RBC: 4.23 Mil/uL (ref 3.87–5.11)
RDW: 14.2 % (ref 11.5–15.5)
WBC: 9.3 10*3/uL (ref 4.0–10.5)

## 2023-04-14 ENCOUNTER — Telehealth: Payer: Self-pay

## 2023-04-14 NOTE — Telephone Encounter (Signed)
Unsuccessful attempt to reach patient on preferred number listed in notes for scheduled AWV. Left message on voicemail okay to reschedule. 

## 2023-04-22 NOTE — Telephone Encounter (Signed)
Caller was transferred to [facility]

## 2023-04-28 DIAGNOSIS — M542 Cervicalgia: Secondary | ICD-10-CM | POA: Diagnosis not present

## 2023-04-28 DIAGNOSIS — M7918 Myalgia, other site: Secondary | ICD-10-CM | POA: Diagnosis not present

## 2023-04-28 DIAGNOSIS — Z6834 Body mass index (BMI) 34.0-34.9, adult: Secondary | ICD-10-CM | POA: Diagnosis not present

## 2023-05-01 ENCOUNTER — Encounter: Payer: Self-pay | Admitting: Family Medicine

## 2023-05-01 ENCOUNTER — Telehealth (INDEPENDENT_AMBULATORY_CARE_PROVIDER_SITE_OTHER): Payer: PPO | Admitting: Family Medicine

## 2023-05-01 VITALS — BP 125/79 | Wt 194.0 lb

## 2023-05-01 DIAGNOSIS — Z0000 Encounter for general adult medical examination without abnormal findings: Secondary | ICD-10-CM | POA: Diagnosis not present

## 2023-05-01 NOTE — Progress Notes (Signed)
PATIENT CHECK-IN and HEALTH RISK ASSESSMENT QUESTIONNAIRE:  -completed by phone/video for upcoming Medicare Preventive Visit  Pre-Visit Check-in: 1)Vitals (height, wt, BP, etc) - record in vitals section for visit on day of visit 2)Review and Update Medications, Allergies PMH, Surgeries, Social history in Epic 3)Hospitalizations in the last year with date/reason? No  4)Review and Update Care Team (patient's specialists) in Epic 5) Complete PHQ9 in Epic  6) Complete Fall Screening in Epic 7)Review all Health Maintenance Due and order under PCP if not done.  8)Medicare Wellness Questionnaire: Answer theses question about your habits: Do you drink alcohol? No  If yes, how many drinks do you have a day?N/A Have you ever smoked?No Quit date if applicable? N/A  How many packs a day do/did you smoke? N/A Do you use smokeless tobacco?No Do you use an illicit drugs?No Do you exercises? Yes IF so, what type and how many days/minutes per week? 4 days - some home PT exercise and balance exercises at home, some weights and things, 30 minutes 4 days week Are you sexually active? No Number of partners?1 Reports diet is not the best, eating more since stressed about job loss Typical breakfast: Eggs and potatoes Typical lunch: Sandwich Typical dinner: Varies Typical snacks: Varies   Beverages: Tea and Water  Answer theses question about you: Can you perform most household chores? Yes Do you find it hard to follow a conversation in a noisy room?Yes Do you often ask people to speak up or repeat themselves? Yes Do you feel that you have a problem with memory? Yes Do you balance your checkbook and or bank acounts? Yes Do you feel safe at home?Yes  Last dentist visit? 7 weeks ago  Do you need assistance with any of the following: Please note if so No  Driving?  Feeding yourself?  Getting from bed to chair?  Getting to the toilet?  Bathing or showering?  Dressing yourself?  Managing  money?  Climbing a flight of stairs Yes  Preparing meals?  Do you have Advanced Directives in place (Living Will, Healthcare Power or Attorney)?  Yes    Last eye Exam and location? Dr. Louanna Raw 03/2023   Do you currently use prescribed or non-prescribed narcotic or opioid pain medications? No  Do you have a history or close family history of breast, ovarian, tubal or peritoneal cancer or a family member with BRCA (breast cancer susceptibility 1 and 2) gene mutations? No  Nurse/Assistant Credentials/time stamp: Mg 05/01/2023   ----------------------------------------------------------------------------------------------------------------------------------------------------------------------------------------------------------------------   MEDICARE ANNUAL PREVENTIVE VISIT WITH PROVIDER: (Welcome to Medicare, initial annual wellness or annual wellness exam)  Virtual Visit via Video Note  I connected with ALAIAH LUNDY on 05/01/23 by phone and verified that I am speaking with the correct person using two identifiers.  Location patient: home Location provider:work or home office Persons participating in the virtual visit: patient, provider  Concerns and/or follow up today: reports is doing ok for the most part. She does have some neck issues and saw a neurosurgeon a few days ago and will be trying PT.    See HM section in Epic for other details of completed HM.    ROS: negative for report of fevers, unintentional weight loss, vision changes, vision loss, hearing loss or change, chest pain, sob, hemoptysis, melena, hematochezia, hematuria, falls, bleeding or bruising, thoughts of suicide or self harm, memory loss Occ asthma symptoms with pollen.  Patient-completed extensive health risk assessment - reviewed and discussed with the patient: See Health Risk Assessment  completed with patient prior to the visit either above or in recent phone note. This was reviewed in detailed with the  patient today and appropriate recommendations, orders and referrals were placed as needed per Summary below and patient instructions.   Review of Medical History: -PMH, PSH, Family History and current specialty and care providers reviewed and updated and listed below   Patient Care Team: [name], NP as PCP - General (Family Medicine) Lucie Leather, Alvira Philips, MD as Consulting Physician (Allergy and Immunology) Verner Chol, [facility] (Inactive) as Pharmacist (Pharmacist)   Past Medical History:  Diagnosis Date   Allergy    Anemia    age 63 only   Asthma    Bowen's disease    ~ 17 yrs ago   Chicken pox    DDD (degenerative disc disease), cervical    Depression    Diabetes mellitus without complication (HCC)    on metformin - goes back andf forth   Gallstones    GERD (gastroesophageal reflux disease)    Hx of melanoma in situ 2002   Hx: UTI (urinary tract infection)    Hyperlipidemia    Hypertension    Hypothyroidism    IBS (irritable bowel syndrome)    with D   Melanoma (HCC)    x2    Nephrolithiasis    Osteoarthritis    PVC's (premature ventricular contractions)    Status post dilation of esophageal narrowing     Past Surgical History:  Procedure Laterality Date   ABDOMINAL HYSTERECTOMY  1974   APPENDECTOMY  1977   BLADDER SURGERY  1974   BREAST EXCISIONAL BIOPSY     CATARACT EXTRACTION Bilateral 2015   CHOLECYSTECTOMY  1987   COLONOSCOPY     RECTAL SURGERY     ~17 yrs ago- Bowens dx per pt with surgery   removal of melanoma in situ      Social History   Socioeconomic History   Marital status: Married    Spouse name: Not on file   Number of children: 2   Years of education: 13   Highest education level: Not on file  Occupational History   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  Tobacco Use   Smoking status: Never   Smokeless tobacco: Never  Vaping Use   Vaping status: Never Used  Substance and Sexual  Activity   Alcohol use: No   Drug use: No   Sexual activity: Yes    Birth control/protection: Post-menopausal, Surgical  Other Topics Concern   Not on file  Social History Narrative   epworth sleepiness scale = 8 (01/18/2016)   Lives at home with her husband and disabled son   Right handed   Caffeine: decaf   Social Determinants of Health   Financial Resource Strain: Medium Risk (05/01/2023)   Overall Financial Resource Strain (CARDIA)    Difficulty of Paying Living Expenses: Somewhat hard  Food Insecurity: Food Insecurity Present (05/01/2023)   Hunger Vital Sign    Worried About Running Out of Food in the Last Year: Sometimes true    Ran Out of Food in the Last Year: Never true  Transportation Needs: No Transportation Needs (05/01/2023)   PRAPARE - Administrator, Civil Service (Medical): No    Lack of Transportation (Non-Medical): No  Physical Activity: Insufficiently Active (05/01/2023)   Exercise Vital Sign    Days of Exercise per Week: 4 days    Minutes of Exercise  per Session: 30 min  Stress: Stress Concern Present (05/01/2023)   [employer] of Occupational Health - Occupational Stress Questionnaire    Feeling of Stress : To some extent  Social Connections: Moderately Integrated (05/01/2023)   Social Connection and Isolation Panel [NHANES]    Frequency of Communication with Friends and Family: More than three times a week    Frequency of Social Gatherings with Friends and Family: Once a week    Attends Religious Services: More than 4 times per year    Active Member of [employer] or Organizations: No    Attends Banker Meetings: Never    Marital Status: Married  Catering manager Violence: Not At Risk (05/01/2023)   Humiliation, Afraid, Rape, and Kick questionnaire    Fear of Current or Ex-Partner: No    Emotionally Abused: No    Physically Abused: No    Sexually Abused: No    Family History  Problem Relation Age of Onset   Hypertension Mother     Thyroid disease Mother    Arthritis Mother    Depression Mother    Hearing loss Mother    Miscarriages / Stillbirths Mother    Heart attack Father    Heart disease Father    Alcohol abuse Father    Arthritis Father    Depression Father    Drug abuse Father    Hypertension Father    Alcohol abuse Sister    Arthritis Sister    Depression Sister    Hearing loss Sister    Hypotension Sister    Depression Sister    Heart disease Sister    Hyperlipidemia Sister    Hypertension Maternal Aunt    Hypertension Maternal Uncle    Clotting disorder Maternal Uncle    Prostate cancer Maternal Uncle    Colon cancer Maternal Uncle    Colon polyps Maternal Uncle    Diabetes Maternal Uncle        all uncles   Arthritis Maternal Grandmother    Asthma Maternal Grandmother    Breast cancer Maternal Grandmother    Stomach cancer Maternal Grandmother    Hearing loss Maternal Grandmother    Diabetes Maternal Grandfather    Heart disease Maternal Grandfather    Arthritis Maternal Grandfather    Hearing loss Maternal Grandfather    Hypertension Maternal Grandfather    Kidney disease Maternal Grandfather    Hyperlipidemia Paternal Grandmother        also MI   Breast cancer Paternal Grandmother    Arthritis Paternal Grandmother    Heart attack Paternal Grandmother    Hyperlipidemia Paternal Grandfather        also MI   Heart disease Paternal Grandfather    Heart attack Paternal Grandfather    Endometriosis Daughter    Hypertension Son    Esophageal cancer Neg Hx    Rectal cancer Neg Hx     Current Outpatient Medications on File Prior to Visit  Medication Sig Dispense Refill   albuterol (VENTOLIN HFA) 108 (90 Base) MCG/ACT inhaler Inhale 2 puffs into the lungs every 4 (four) hours as needed for wheezing or shortness of breath. 18 g 1   amLODipine (NORVASC) 5 MG tablet TAKE ONE TABLET BY MOUTH ONCE DAILY 90 tablet 3   blood glucose meter kit and supplies Dispense based on patient and  insurance preference. Use up to four times daily as directed. (FOR ICD-10 E10.9, E11.9). 1 each 0   budesonide (RHINOCORT AQUA) 32 MCG/ACT nasal spray Place  1 spray into both nostrils daily as needed for allergies.      calcium carbonate (OSCAL) 1500 (600 Ca) MG TABS tablet Take by mouth 2 (two) times daily with a meal.     carvedilol (COREG) 25 MG tablet TAKE ONE TABLET BY MOUTH EVERY MORNING and TAKE ONE TABLET BY MOUTH EVERY EVENING 180 tablet 3   Cholecalciferol (VITAMIN D) 50 MCG (2000 UT) tablet Take 2,000 Units by mouth daily.      citalopram (CELEXA) 10 MG tablet TAKE 1/2 TABLET BY MOUTH EVERY MORNING 45 tablet 11   Continuous Blood Gluc Sensor (FREESTYLE LIBRE 2 SENSOR) MISC USE TO check blood glucose AND CHANGE sensor every 14 DAYS 2 each 11   cycloSPORINE (RESTASIS) 0.05 % ophthalmic emulsion Place 1 drop into both eyes 2 (two) times daily.     estradiol (DOTTI) 0.05 MG/24HR patch PLACE ONE PATCH onto THE SKIN TWICE WEEKLY 8 patch 2   famotidine (PEPCID) 20 MG tablet Take 1 tablet (20 mg total) by mouth in the morning. 90 tablet 3   fexofenadine (ALLEGRA) 180 MG tablet Take 1 tablet (180 mg total) by mouth daily as needed for allergies or rhinitis (Can take an extra dose during flare ups.). 180 tablet 3   Fluticasone-Umeclidin-Vilant (TRELEGY ELLIPTA) 200-62.5-25 MCG/ACT AEPB Inhale 1 puff into the lungs in the morning. 180 each 3   hydrochlorothiazide (HYDRODIURIL) 25 MG tablet TAKE ONE TABLET BY MOUTH EVERY MORNING 90 tablet 2   irbesartan (AVAPRO) 150 MG tablet TAKE ONE TABLET BY MOUTH TWICE DAILY 180 tablet 3   Lancets (ONETOUCH DELICA PLUS LANCET33G) MISC USE TO check blood glucose UP TO four times daily AS DIRECTED 100 each 0   levothyroxine (SYNTHROID) 50 MCG tablet TAKE ONE TABLET BY MOUTH EVERY MORNING 90 tablet 2   metFORMIN (GLUCOPHAGE) 500 MG tablet TAKE ONE TABLET BY MOUTH TWICE DAILY WITH A MEAL 180 tablet 11   minoxidil (ROGAINE) 2 % external solution Apply topically daily.      naproxen (NAPROSYN) 500 MG tablet Take 1 tablet (500 mg total) by mouth 2 (two) times daily with a meal. 60 tablet 0   ONETOUCH ULTRA test strip USE TO check blood glucose UP TO four times daily AS DIRECTED 100 strip 0   potassium chloride (KLOR-CON) 10 MEQ tablet TAKE ONE TABLET BY MOUTH EVERY EVENING 90 tablet 2   rosuvastatin (CRESTOR) 10 MG tablet TAKE ONE TABLET BY MOUTH every other evening 90 tablet 2   triamcinolone (NASACORT) 55 MCG/ACT AERO nasal inhaler Place 1 spray into the nose daily. 51 mL 3   TURMERIC PO Take 500 mg by mouth daily.      Multiple Vitamins-Minerals (ALIVE WOMENS GUMMY PO) Take 1 tablet by mouth daily. (Patient not taking: Reported on 05/01/2023)     multivitamin-lutein (OCUVITE-LUTEIN) CAPS capsule Take 1 capsule by mouth daily. (Patient not taking: Reported on 05/01/2023)     No current facility-administered medications on file prior to visit.    Allergies  Allergen Reactions   Codeine Phosphate Nausea And Vomiting    REACTION: unspecified   Prednisone Shortness Of Breath   Betadine [Povidone Iodine] Hives    After surgical skin prep   Sulfa Antibiotics Nausea And Vomiting   Iodine Rash    Broke out on skin once years ago--**Betadine Skin Prep**       Physical Exam Vitals:   05/01/23 1455  BP: 125/79   Estimated body mass index is 33.53 kg/m as calculated from the following:  Height as of 04/09/23: 5' 3.78" (1.62 m).   Weight as of this encounter: 194 lb (88 kg).  EKG (optional): deferred due to virtual visit  GENERAL: alert, oriented, no acute distress detected, full vision exam deferred due to pandemic and/or virtual encounter  PSYCH/NEURO: pleasant and cooperative, no obvious depression or anxiety, speech and thought processing grossly intact, Cognitive function grossly intact  Flowsheet Row Video Visit from 05/01/2023 in [facility]  PHQ-9 Total Score 14           05/01/2023    2:59 PM 04/09/2023     2:51 PM 09/05/2022    9:26 AM 05/15/2022    9:21 AM 04/09/2022   10:01 AM  Depression screen PHQ 2/9  Decreased Interest 0 1 3 1  0  Down, Depressed, Hopeless 3 2 2 1  0  PHQ - 2 Score 3 3 5 2  0  Altered sleeping 3 0 2 0   Tired, decreased energy 3 2 2 1    Change in appetite 1 2 2 1    Feeling bad or failure about yourself  1 2 2 3    Trouble concentrating 3 2 1 2    Moving slowly or fidgety/restless 0 0 2 0   Suicidal thoughts 0 0 0 0   PHQ-9 Score 14 11 16 9    Difficult doing work/chores Somewhat difficult Somewhat difficult Somewhat difficult Somewhat difficult   Reports she is going through stressful time 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 She does not feel needs counseling or anything.      03/02/2020    7:13 AM 04/02/2021   10:42 AM 04/09/2022   10:05 AM 04/09/2023    2:52 PM 05/01/2023    3:01 PM  Fall Risk  Falls in the past year? 0 0 0 0 0  Was there an injury with Fall?  0 0 0 0  Fall Risk Category Calculator  0 0 0 0  Fall Risk Category (Retired)  Low Low    (RETIRED) Patient Fall Risk Level  Low fall risk Low fall risk    Patient at Risk for Falls Due to  No Fall Risks No Fall Risks Other (Comment) No Fall Risks  Fall risk Follow up    Falls evaluation completed Falls evaluation completed     SUMMARY AND PLAN:  Encounter for Medicare annual wellness exam   Discussed applicable health maintenance/preventive health measures and advised and referred or ordered per patient preferences: -discussed vaccines due, recommendation and where she can get them -she has mammogram scheduled later this month and plans to get   Health Maintenance  Topic Date Due   Diabetic kidney evaluation - Urine ACR  Never done   Zoster Vaccines- Shingrix (1 of 2) Never done   DTaP/Tdap/Td (3 - Td or Tdap) 01/28/2021   MAMMOGRAM  03/20/2023   INFLUENZA VACCINE  05/22/2023   Diabetic kidney evaluation - eGFR measurement  04/08/2024    Medicare Annual Wellness (AWV)  04/30/2024   Fecal DNA (Cologuard)  01/17/2025   Pneumonia Vaccine 63+ Years old  Completed   DEXA SCAN  Completed   Hepatitis C Screening  Completed   HPV VACCINES  Aged Out   COVID-19 Vaccine  Discontinued    Education and counseling on the following was provided based on the above review of health and a plan/checklist for the patient, along with additional information discussed, was provided for the patient  in the patient instructions :  -Advised on importance of completing advanced directives, discussed options for completing and provided information in patient instructions as well -Provided counseling and plan for difficulty hearing  -Provided counseling and plan for increased risk of falling if applicable per above screening. Reviewed and demonstrated safe balance exercises that can be done at home to improve balance and discussed exercise guidelines for adults with include balance exercises at least 3 days per week.  -Advised and counseled on a healthy lifestyle - including the importance of a healthy diet, regular physical activity, social connections and stress management. -Reviewed patient's current diet. Advised and counseled on a whole foods based healthy diet with an emphasis on foods to help heal diabetes and kidney disease as she was worried about recent kidney labs. Also discussed hydration and she had added an additional MV recently which could have caused change so advised to stop the supplement before lab check and to drink plenty of water as well. She sees PCP in a few weeks to follow up. A summary of a healthy diet was provided in the Patient Instructions.  -reviewed patient's current physical activity level and discussed exercise guidelines for adults. Discussed community resources and ideas for safe exercise at home to assist in meeting exercise guideline recommendations in a safe and healthy way. She will be working with PT for help with her  chronic neck issues and advised easing slowly into any new exercise and building very gradually given this hx. - starting with very light weight and gradually working up repetitions and then even more gradually on the weights., walking in place, balance exercises, etc.  -Advise yearly dental visits at minimum and regular eye exams   Follow up: see patient instructions     Patient Instructions  I really enjoyed getting to talk with you today! I am available on Tuesdays and Thursdays for virtual visits if you have any questions or concerns, or if I can be of any further assistance.   CHECKLIST FROM ANNUAL WELLNESS VISIT:  -Follow up (please call to schedule if not scheduled after visit):   -yearly for annual wellness visit with primary care office  Here is a list of your preventive care/health maintenance measures and the plan for each if any are due:  PLAN For any measures below that may be due:   Health Maintenance  Topic Date Due   Diabetic kidney evaluation - Urine ACR  Never done   Zoster Vaccines- Shingrix (1 of 2) Never done   DTaP/Tdap/Td (3 - Td or Tdap) 01/28/2021   MAMMOGRAM  03/20/2023   INFLUENZA VACCINE  05/22/2023   Diabetic kidney evaluation - eGFR measurement  04/08/2024   Medicare Annual Wellness (AWV)  04/30/2024   Fecal DNA (Cologuard)  01/17/2025   Pneumonia Vaccine 70+ Years old  Completed   DEXA SCAN  Completed   Hepatitis C Screening  Completed   HPV VACCINES  Aged Out   COVID-19 Vaccine  Discontinued    -See a dentist at least yearly  -Get your eyes checked and then per your eye specialist's recommendations  -Other issues addressed today:   -I have included below further information regarding a healthy whole foods based diet, physical activity guidelines for adults, stress management and opportunities for social connections. I hope you find this information useful.    -----------------------------------------------------------------------------------------------------------------------------------------------------------------------------------------------------------------------------------------------------------  NUTRITION: -eat real food: lots of colorful vegetables (half the plate) and fruits -5-7 servings of vegetables and fruits per day (fresh or steamed is  best), exp. 2 servings of vegetables with lunch and dinner and 2 servings of fruit per day. Berries and greens such as kale and collards are great choices.  -consume on a regular basis: whole grains (make sure first ingredient on label contains the word "whole"), fresh fruits, fish, nuts, seeds, healthy oils (such as olive oil, avocado oil, grape seed oil) -may eat small amounts of dairy and lean meat on occasion, but avoid processed meats such as ham, bacon, lunch meat, etc. -drink water -try to avoid fast food and pre-packaged foods, processed meat -most experts advise limiting sodium to < 2300mg  per day, should limit further is any chronic conditions such as high blood pressure, heart disease, diabetes, etc. The American Heart Association advised that < 1500mg  is is ideal -try to avoid foods that contain any ingredients with names you do not recognize  -try to avoid sugar/sweets (except for the natural sugar that occurs in fresh fruit) -try to avoid sweet drinks -try to avoid white rice, white bread, pasta (unless whole grain), white or yellow potatoes  EXERCISE GUIDELINES FOR ADULTS: -if you wish to increase your physical activity, do so gradually and with the approval of your doctor -STOP and seek medical care immediately if you have any chest pain, chest discomfort or trouble breathing when starting or increasing exercise  -move and stretch your body, legs, feet and arms when sitting for long periods -Physical activity guidelines for optimal health in adults: -least 150 minutes per week of  aerobic exercise (can talk, but not sing) once approved by your doctor, 20-30 minutes of sustained activity or two 10 minute episodes of sustained activity every day.  -resistance training at least 2 days per week if approved by your doctor -balance exercises 3+ days per week:   Stand somewhere where you have something sturdy to hold onto if you lose balance.    1) lift up on toes, start with 5x per day and work up to 20x   2) stand and lift on leg straight out to the side so that foot is a few inches of the floor, start with 5x each side and work up to 20x each side   3) stand on one foot, start with 5 seconds each side and work up to 20 seconds on each side  If you need ideas or help with getting more active:  -Silver sneakers https://tools.silversneakers.com  -Walk with a Doc: http://www.duncan-williams.com/  -try to include resistance (weight lifting/strength building) and balance exercises twice per week: or the following link for ideas: http://castillo-powell.com/  BuyDucts.dk  STRESS MANAGEMENT: -can try meditating, or just sitting quietly with deep breathing while intentionally relaxing all parts of your body for 5 minutes daily -if you need further help with stress, anxiety or depression please follow up with your primary doctor or contact the wonderful folks at [facility]: [phone]  SOCIAL CONNECTIONS: -options in [place] if you wish to engage in more social and exercise related activities:  -Silver sneakers https://tools.silversneakers.com  -Walk with a Doc: http://www.duncan-williams.com/  -Check out the [facility] Active Adults 50+ section on the [place] of [employer] (hiking clubs, book clubs, cards and games, chess, exercise classes, aquatic classes and much more) - see the website for  details: https://www.[place].gov/departments/parks-recreation/active-adults50  -YouTube has lots of exercise videos for different ages and abilities as well  -[facility] (a variety of indoor and outdoor inperson activities for adults). 902-114-1888. [address]  -Virtual Online Classes (a variety of topics): see seniorplanet.org  or call [phone]  -consider volunteering at a school, hospice center, church, senior center or elsewhere           Terressa Koyanagi, DO

## 2023-05-01 NOTE — Patient Instructions (Signed)
I really enjoyed getting to talk with you today! I am available on Tuesdays and Thursdays for virtual visits if you have any questions or concerns, or if I can be of any further assistance.   CHECKLIST FROM ANNUAL WELLNESS VISIT:  -Follow up (please call to schedule if not scheduled after visit):   -yearly for annual wellness visit with primary care office  Here is a list of your preventive care/health maintenance measures and the plan for each if any are due:  PLAN For any measures below that may be due:   Health Maintenance  Topic Date Due   Diabetic kidney evaluation - Urine ACR  Never done   Zoster Vaccines- Shingrix (1 of 2) Never done   DTaP/Tdap/Td (3 - Td or Tdap) 01/28/2021   MAMMOGRAM  03/20/2023   INFLUENZA VACCINE  05/22/2023   Diabetic kidney evaluation - eGFR measurement  04/08/2024   Medicare Annual Wellness (AWV)  04/30/2024   Fecal DNA (Cologuard)  01/17/2025   Pneumonia Vaccine 31+ Years old  Completed   DEXA SCAN  Completed   Hepatitis C Screening  Completed   HPV VACCINES  Aged Out   COVID-19 Vaccine  Discontinued    -See a dentist at least yearly  -Get your eyes checked and then per your eye specialist's recommendations  -Other issues addressed today:   -I have included below further information regarding a healthy whole foods based diet, physical activity guidelines for adults, stress management and opportunities for social connections. I hope you find this information useful.   -----------------------------------------------------------------------------------------------------------------------------------------------------------------------------------------------------------------------------------------------------------  NUTRITION: -eat real food: lots of colorful vegetables (half the plate) and fruits -5-7 servings of vegetables and fruits per day (fresh or steamed is best), exp. 2 servings of vegetables with lunch and dinner and 2 servings of  fruit per day. Berries and greens such as kale and collards are great choices.  -consume on a regular basis: whole grains (make sure first ingredient on label contains the word "whole"), fresh fruits, fish, nuts, seeds, healthy oils (such as olive oil, avocado oil, grape seed oil) -may eat small amounts of dairy and lean meat on occasion, but avoid processed meats such as ham, bacon, lunch meat, etc. -drink water -try to avoid fast food and pre-packaged foods, processed meat -most experts advise limiting sodium to < 2300mg  per day, should limit further is any chronic conditions such as high blood pressure, heart disease, diabetes, etc. The American Heart Association advised that < 1500mg  is is ideal -try to avoid foods that contain any ingredients with names you do not recognize  -try to avoid sugar/sweets (except for the natural sugar that occurs in fresh fruit) -try to avoid sweet drinks -try to avoid white rice, white bread, pasta (unless whole grain), white or yellow potatoes  EXERCISE GUIDELINES FOR ADULTS: -if you wish to increase your physical activity, do so gradually and with the approval of your doctor -STOP and seek medical care immediately if you have any chest pain, chest discomfort or trouble breathing when starting or increasing exercise  -move and stretch your body, legs, feet and arms when sitting for long periods -Physical activity guidelines for optimal health in adults: -least 150 minutes per week of aerobic exercise (can talk, but not sing) once approved by your doctor, 20-30 minutes of sustained activity or two 10 minute episodes of sustained activity every day.  -resistance training at least 2 days per week if approved by your doctor -balance exercises 3+ days per week:   Stand  somewhere where you have something sturdy to hold onto if you lose balance.    1) lift up on toes, start with 5x per day and work up to 20x   2) stand and lift on leg straight out to the side so  that foot is a few inches of the floor, start with 5x each side and work up to 20x each side   3) stand on one foot, start with 5 seconds each side and work up to 20 seconds on each side  If you need ideas or help with getting more active:  -Silver sneakers https://tools.silversneakers.com  -Walk with a Doc: http://www.duncan-williams.com/  -try to include resistance (weight lifting/strength building) and balance exercises twice per week: or the following link for ideas: http://castillo-powell.com/  BuyDucts.dk  STRESS MANAGEMENT: -can try meditating, or just sitting quietly with deep breathing while intentionally relaxing all parts of your body for 5 minutes daily -if you need further help with stress, anxiety or depression please follow up with your primary doctor or contact the wonderful folks at [facility]: [phone]  SOCIAL CONNECTIONS: -options in [place] if you wish to engage in more social and exercise related activities:  -Silver sneakers https://tools.silversneakers.com  -Walk with a Doc: http://www.duncan-williams.com/  -Check out the [facility] Active Adults 50+ section on the [place] of [employer] (hiking clubs, book clubs, cards and games, chess, exercise classes, aquatic classes and much more) - see the website for details: https://www.[place].gov/departments/parks-recreation/active-adults50  -YouTube has lots of exercise videos for different ages and abilities as well  -[facility] (a variety of indoor and outdoor inperson activities for adults). [phone]. [address]  -Virtual Online Classes (a variety of topics): see seniorplanet.org or call [phone]  -consider volunteering at a school, hospice center, church, senior center or elsewhere

## 2023-05-06 ENCOUNTER — Ambulatory Visit
Admission: RE | Admit: 2023-05-06 | Discharge: 2023-05-06 | Disposition: A | Discharge status: Home / Self Care | Payer: PPO | Source: Ambulatory Visit | Attending: Adult Health | Admitting: Adult Health

## 2023-05-06 DIAGNOSIS — Z1231 Encounter for screening mammogram for malignant neoplasm of breast: Secondary | ICD-10-CM | POA: Diagnosis not present

## 2023-05-20 ENCOUNTER — Ambulatory Visit (INDEPENDENT_AMBULATORY_CARE_PROVIDER_SITE_OTHER): Payer: PPO | Admitting: Adult Health

## 2023-05-20 ENCOUNTER — Ambulatory Visit: Payer: PPO | Admitting: Adult Health

## 2023-05-20 ENCOUNTER — Encounter: Payer: Self-pay | Admitting: Adult Health

## 2023-05-20 VITALS — BP 130/70 | HR 64 | Temp 98.1°F | Ht 63.0 in | Wt 197.0 lb

## 2023-05-20 DIAGNOSIS — Z7984 Long term (current) use of oral hypoglycemic drugs: Secondary | ICD-10-CM

## 2023-05-20 DIAGNOSIS — N951 Menopausal and female climacteric states: Secondary | ICD-10-CM | POA: Diagnosis not present

## 2023-05-20 DIAGNOSIS — I10 Essential (primary) hypertension: Secondary | ICD-10-CM

## 2023-05-20 DIAGNOSIS — E785 Hyperlipidemia, unspecified: Secondary | ICD-10-CM | POA: Diagnosis not present

## 2023-05-20 DIAGNOSIS — F32A Depression, unspecified: Secondary | ICD-10-CM | POA: Diagnosis not present

## 2023-05-20 DIAGNOSIS — J4520 Mild intermittent asthma, uncomplicated: Secondary | ICD-10-CM | POA: Diagnosis not present

## 2023-05-20 DIAGNOSIS — Z0000 Encounter for general adult medical examination without abnormal findings: Secondary | ICD-10-CM | POA: Diagnosis not present

## 2023-05-20 DIAGNOSIS — E039 Hypothyroidism, unspecified: Secondary | ICD-10-CM | POA: Diagnosis not present

## 2023-05-20 DIAGNOSIS — F419 Anxiety disorder, unspecified: Secondary | ICD-10-CM

## 2023-05-20 DIAGNOSIS — E118 Type 2 diabetes mellitus with unspecified complications: Secondary | ICD-10-CM | POA: Diagnosis not present

## 2023-05-20 LAB — CBC WITH DIFFERENTIAL/PLATELET
Basophils Absolute: 0.1 10*3/uL (ref 0.0–0.1)
Basophils Relative: 0.9 % (ref 0.0–3.0)
Eosinophils Absolute: 0.4 10*3/uL (ref 0.0–0.7)
Eosinophils Relative: 6 % — ABNORMAL HIGH (ref 0.0–5.0)
HCT: 39.3 % (ref 36.0–46.0)
Hemoglobin: 13 g/dL (ref 12.0–15.0)
Lymphocytes Relative: 19.3 % (ref 12.0–46.0)
Lymphs Abs: 1.2 10*3/uL (ref 0.7–4.0)
MCHC: 33 g/dL (ref 30.0–36.0)
MCV: 89.3 fl (ref 78.0–100.0)
Monocytes Absolute: 0.8 10*3/uL (ref 0.1–1.0)
Monocytes Relative: 13.1 % — ABNORMAL HIGH (ref 3.0–12.0)
Neutro Abs: 3.8 10*3/uL (ref 1.4–7.7)
Neutrophils Relative %: 60.7 % (ref 43.0–77.0)
Platelets: 224 10*3/uL (ref 150.0–400.0)
RBC: 4.4 Mil/uL (ref 3.87–5.11)
RDW: 14.5 % (ref 11.5–15.5)
WBC: 6.3 10*3/uL (ref 4.0–10.5)

## 2023-05-20 LAB — MICROALBUMIN / CREATININE URINE RATIO
Creatinine,U: 24.3 mg/dL
Microalb Creat Ratio: 2.9 mg/g (ref 0.0–30.0)
Microalb, Ur: <0.7 mg/dL (ref 0.0–1.9)

## 2023-05-20 LAB — LIPID PANEL
Cholesterol: 153 mg/dL (ref 0–200)
HDL: 58.7 mg/dL (ref >39.00)
LDL Cholesterol: 76 mg/dL (ref 0–99)
NonHDL: 93.99
Total CHOL/HDL Ratio: 3
Triglycerides: 91 mg/dL (ref 0.0–149.0)
VLDL: 18.2 mg/dL (ref 0.0–40.0)

## 2023-05-20 LAB — COMPREHENSIVE METABOLIC PANEL
ALT: 20 U/L (ref 0–35)
AST: 22 U/L (ref 0–37)
Albumin: 4.4 g/dL (ref 3.5–5.2)
Alkaline Phosphatase: 83 U/L (ref 39–117)
BUN: 15 mg/dL (ref 6–23)
CO2: 26 mEq/L (ref 19–32)
Calcium: 10.2 mg/dL (ref 8.4–10.5)
Chloride: 101 mEq/L (ref 96–112)
Creatinine, Ser: 0.98 mg/dL (ref 0.40–1.20)
GFR: 57.73 mL/min — ABNORMAL LOW (ref >60.00)
Glucose, Bld: 122 mg/dL — ABNORMAL HIGH (ref 70–99)
Potassium: 3.9 mEq/L (ref 3.5–5.1)
Sodium: 138 mEq/L (ref 135–145)
Total Bilirubin: 0.5 mg/dL (ref 0.2–1.2)
Total Protein: 7.3 g/dL (ref 6.0–8.3)

## 2023-05-20 LAB — TSH: TSH: 4.9 u[IU]/mL (ref 0.35–5.50)

## 2023-05-20 LAB — HEMOGLOBIN A1C: Hgb A1c MFr Bld: 6.3 % (ref 4.6–6.5)

## 2023-05-20 MED ORDER — HYDROCHLOROTHIAZIDE 25 MG PO TABS
25.0000 mg | ORAL_TABLET | Freq: Every morning | ORAL | 3 refills | Status: DC
Start: 2023-05-20 — End: 2023-06-24

## 2023-05-20 NOTE — Progress Notes (Signed)
Subjective:    Patient ID: Amber Brewer, female    DOB: 03/23/1951, 72 y.o.   MRN: [id number]  HPI Patient presents for yearly preventative medicine examination. She is a pleasant 72 year old female who  has a past medical history of Allergy, Anemia, Asthma, Bowen's disease, Chicken pox, DDD (degenerative disc disease), cervical, Depression, Diabetes mellitus without complication (HCC), Gallstones, GERD (gastroesophageal reflux disease), melanoma in situ (2002), UTI (urinary tract infection), Hyperlipidemia, Hypertension, Hypothyroidism, IBS (irritable bowel syndrome), Melanoma (HCC), Nephrolithiasis, Osteoarthritis, PVC's (premature ventricular contractions), and Status post dilation of esophageal narrowing.  DM Type 2 -formally diagnosed in July 2023 at her CPE when her A1c was 6.9.  At this time she was on metformin 250 daily for prediabetes.  This was changed to 500 mg twice daily.  She has been tolerating this medication well and has been working on lifestyle modifications.  She continues to use the libre CGM. She did not bring her reader today.. She denies episodes of hypoglycemia.  Lab Results  Component Value Date   HGBA1C 5.8 (A) 12/10/2022   Hypertension-managed with Norvasc 5 mg daily, Coreg 25 mg twice daily, HCTZ 25 mg daily, and Avapro 150 mg daily.  He does take a potassium supplement of 10 meq .  She checks her blood pressures at home with readings mostly in the 120s over 70s BP Readings from Last 3 Encounters:  05/20/23 130/70  05/01/23 125/79  04/09/23 126/70   Hyperlipidemia -managed with Crestor 10 mg daily.  She denies myalgia or fatigue Lab Results  Component Value Date   CHOL 176 05/15/2022   HDL 63.10 05/15/2022   LDLCALC 99 05/15/2022   LDLDIRECT 143.5 01/15/2011   TRIG 73.0 05/15/2022   CHOLHDL 3 05/15/2022   Hypothyroidism - managed with Synthroid 50 mcg daily.  Lab Results  Component Value Date   TSH 4.12 05/15/2022    Asthma-managed Trelegy Ellipta  200 mcg 1 puff daily and albuterol as needed.  She does feel well controlled.  Anxiety and Depression -managed with citalopram 5 mg daily.  She does feel as though this was controlled for the most part but has been having increased depression due to being laid off from a job she loved.   Postmenopausal estrogen deficiency-uses estrogen patch 0.5 mg 24 hours twice a week  Chronic neck pain - seen by Neurosurgery. Currently going through PT before deciding on epidural injection    All immunizations and health maintenance protocols were reviewed with the patient and needed orders were placed. Due for shingles and tdap   Appropriate screening laboratory values were ordered for the patient including screening of hyperlipidemia, renal function and hepatic function.   Medication reconciliation,  past medical history, social history, problem list and allergies were reviewed in detail with the patient  Goals were established with regard to weight loss, exercise, and  diet in compliance with medications  She is up to date on routine colon cancer screening   Review of Systems  Constitutional: Negative.   HENT: Negative.    Eyes: Negative.   Respiratory: Negative.    Cardiovascular: Negative.   Gastrointestinal: Negative.   Endocrine: Negative.   Genitourinary: Negative.   Musculoskeletal:  Positive for neck pain.  Skin: Negative.   Allergic/Immunologic: Negative.   Neurological: Negative.   Hematological: Negative.   Psychiatric/Behavioral: Negative.     Past Medical History:  Diagnosis Date   Allergy    Anemia    age 32 only   Asthma  Bowen's disease    ~ 17 yrs ago   Chicken pox    DDD (degenerative disc disease), cervical    Depression    Diabetes mellitus without complication (HCC)    on metformin - goes back andf forth   Gallstones    GERD (gastroesophageal reflux disease)    Hx of melanoma in situ 2002   Hx: UTI (urinary tract infection)    Hyperlipidemia     Hypertension    Hypothyroidism    IBS (irritable bowel syndrome)    with D   Melanoma (HCC)    x2    Nephrolithiasis    Osteoarthritis    PVC's (premature ventricular contractions)    Status post dilation of esophageal narrowing     Social History   Socioeconomic History   Marital status: Married    Spouse name: Not on file   Number of children: 2   Years of education: 13   Highest education level: Not on file  Occupational History   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  Tobacco Use   Smoking status: Never   Smokeless tobacco: Never  Vaping Use   Vaping status: Never Used  Substance and Sexual Activity   Alcohol use: No   Drug use: No   Sexual activity: Yes    Birth control/protection: Post-menopausal, Surgical  Other Topics Concern   Not on file  Social History Narrative   epworth sleepiness scale = 8 (01/18/2016)   Lives at home with her husband and disabled son   Right handed   Caffeine: decaf   Social Determinants of Health   Financial Resource Strain: Medium Risk (05/01/2023)   Overall Financial Resource Strain (CARDIA)    Difficulty of Paying Living Expenses: Somewhat hard  Food Insecurity: Food Insecurity Present (05/01/2023)   Hunger Vital Sign    Worried About Running Out of Food in the Last Year: Sometimes true    Ran Out of Food in the Last Year: Never true  Transportation Needs: No Transportation Needs (05/01/2023)   PRAPARE - Administrator, Civil Service (Medical): No    Lack of Transportation (Non-Medical): No  Physical Activity: Insufficiently Active (05/01/2023)   Exercise Vital Sign    Days of Exercise per Week: 4 days    Minutes of Exercise per Session: 30 min  Stress: Stress Concern Present (05/01/2023)   [employer] of Occupational Health - Occupational Stress Questionnaire    Feeling of Stress : To some extent  Social Connections: Moderately Integrated (05/01/2023)   Social  Connection and Isolation Panel [NHANES]    Frequency of Communication with Friends and Family: More than three times a week    Frequency of Social Gatherings with Friends and Family: Once a week    Attends Religious Services: More than 4 times per year    Active Member of [employer] or Organizations: No    Attends Banker Meetings: Never    Marital Status: Married  Catering manager Violence: Not At Risk (05/01/2023)   Humiliation, Afraid, Rape, and Kick questionnaire    Fear of Current or Ex-Partner: No    Emotionally Abused: No    Physically Abused: No    Sexually Abused: No    Past Surgical History:  Procedure Laterality Date   ABDOMINAL HYSTERECTOMY  1974   APPENDECTOMY  1977   BLADDER SURGERY  1974   BREAST EXCISIONAL BIOPSY     CATARACT EXTRACTION Bilateral 2015  CHOLECYSTECTOMY  1987   COLONOSCOPY     RECTAL SURGERY     ~17 yrs ago- Bowens dx per pt with surgery   removal of melanoma in situ      Family History  Problem Relation Age of Onset   Hypertension Mother    Thyroid disease Mother    Arthritis Mother    Depression Mother    Hearing loss Mother    Miscarriages / Stillbirths Mother    Heart attack Father    Heart disease Father    Alcohol abuse Father    Arthritis Father    Depression Father    Drug abuse Father    Hypertension Father    Alcohol abuse Sister    Arthritis Sister    Depression Sister    Hearing loss Sister    Hypotension Sister    Depression Sister    Heart disease Sister    Hyperlipidemia Sister    Hypertension Maternal Aunt    Hypertension Maternal Uncle    Clotting disorder Maternal Uncle    Prostate cancer Maternal Uncle    Colon cancer Maternal Uncle    Colon polyps Maternal Uncle    Diabetes Maternal Uncle        all uncles   Arthritis Maternal Grandmother    Asthma Maternal Grandmother    Breast cancer Maternal Grandmother    Stomach cancer Maternal Grandmother    Hearing loss Maternal Grandmother    Diabetes  Maternal Grandfather    Heart disease Maternal Grandfather    Arthritis Maternal Grandfather    Hearing loss Maternal Grandfather    Hypertension Maternal Grandfather    Kidney disease Maternal Grandfather    Hyperlipidemia Paternal Grandmother        also MI   Breast cancer Paternal Grandmother    Arthritis Paternal Grandmother    Heart attack Paternal Grandmother    Hyperlipidemia Paternal Grandfather        also MI   Heart disease Paternal Grandfather    Heart attack Paternal Grandfather    Endometriosis Daughter    Hypertension Son    Esophageal cancer Neg Hx    Rectal cancer Neg Hx     Allergies  Allergen Reactions   Codeine Phosphate Nausea And Vomiting    REACTION: unspecified   Prednisone Shortness Of Breath   Betadine [Povidone Iodine] Hives    After surgical skin prep   Sulfa Antibiotics Nausea And Vomiting   Iodine Rash    Broke out on skin once years ago--**Betadine Skin Prep**    Current Outpatient Medications on File Prior to Visit  Medication Sig Dispense Refill   albuterol (VENTOLIN HFA) 108 (90 Base) MCG/ACT inhaler Inhale 2 puffs into the lungs every 4 (four) hours as needed for wheezing or shortness of breath. 18 g 1   amLODipine (NORVASC) 5 MG tablet TAKE ONE TABLET BY MOUTH ONCE DAILY 90 tablet 3   blood glucose meter kit and supplies Dispense based on patient and insurance preference. Use up to four times daily as directed. (FOR ICD-10 E10.9, E11.9). 1 each 0   budesonide (RHINOCORT AQUA) 32 MCG/ACT nasal spray Place 1 spray into both nostrils daily as needed for allergies.      calcium carbonate (OSCAL) 1500 (600 Ca) MG TABS tablet Take by mouth 2 (two) times daily with a meal.     carvedilol (COREG) 25 MG tablet TAKE ONE TABLET BY MOUTH EVERY MORNING and TAKE ONE TABLET BY MOUTH EVERY EVENING 180 tablet 3  Cholecalciferol (VITAMIN D) 50 MCG (2000 UT) tablet Take 2,000 Units by mouth daily.      citalopram (CELEXA) 10 MG tablet TAKE 1/2 TABLET BY  MOUTH EVERY MORNING 45 tablet 11   Continuous Blood Gluc Sensor (FREESTYLE LIBRE 2 SENSOR) MISC USE TO check blood glucose AND CHANGE sensor every 14 DAYS 2 each 11   cycloSPORINE (RESTASIS) 0.05 % ophthalmic emulsion Place 1 drop into both eyes 2 (two) times daily.     estradiol (DOTTI) 0.05 MG/24HR patch PLACE ONE PATCH onto THE SKIN TWICE WEEKLY 8 patch 2   famotidine (PEPCID) 20 MG tablet Take 1 tablet (20 mg total) by mouth in the morning. 90 tablet 3   fexofenadine (ALLEGRA) 180 MG tablet Take 1 tablet (180 mg total) by mouth daily as needed for allergies or rhinitis (Can take an extra dose during flare ups.). 180 tablet 3   Fluticasone-Umeclidin-Vilant (TRELEGY ELLIPTA) 200-62.5-25 MCG/ACT AEPB Inhale 1 puff into the lungs in the morning. 180 each 3   hydrochlorothiazide (HYDRODIURIL) 25 MG tablet TAKE ONE TABLET BY MOUTH EVERY MORNING 90 tablet 2   irbesartan (AVAPRO) 150 MG tablet TAKE ONE TABLET BY MOUTH TWICE DAILY 180 tablet 3   Lancets (ONETOUCH DELICA PLUS LANCET33G) MISC USE TO check blood glucose UP TO four times daily AS DIRECTED 100 each 0   levothyroxine (SYNTHROID) 50 MCG tablet TAKE ONE TABLET BY MOUTH EVERY MORNING 90 tablet 2   metFORMIN (GLUCOPHAGE) 500 MG tablet TAKE ONE TABLET BY MOUTH TWICE DAILY WITH A MEAL 180 tablet 11   minoxidil (ROGAINE) 2 % external solution Apply topically daily.     Multiple Vitamins-Minerals (ALIVE WOMENS GUMMY PO) Take 1 tablet by mouth daily.     multivitamin-lutein (OCUVITE-LUTEIN) CAPS capsule Take 1 capsule by mouth daily.     naproxen (NAPROSYN) 500 MG tablet Take 1 tablet (500 mg total) by mouth 2 (two) times daily with a meal. 60 tablet 0   ONETOUCH ULTRA test strip USE TO check blood glucose UP TO four times daily AS DIRECTED 100 strip 0   potassium chloride (KLOR-CON) 10 MEQ tablet TAKE ONE TABLET BY MOUTH EVERY EVENING 90 tablet 2   rosuvastatin (CRESTOR) 10 MG tablet TAKE ONE TABLET BY MOUTH every other evening 90 tablet 2    TURMERIC PO Take 500 mg by mouth daily.      No current facility-administered medications on file prior to visit.    BP 130/70   Pulse 64   Temp 98.1 F (36.7 C) (Oral)   Ht 5\' 3"  (1.6 m)   Wt 197 lb (89.4 kg)   SpO2 96%   BMI 34.90 kg/m       Objective:   Physical Exam Vitals and nursing note reviewed.  Constitutional:      General: She is not in acute distress.    Appearance: Normal appearance. She is not ill-appearing.  HENT:     Head: Normocephalic and atraumatic.     Right Ear: Tympanic membrane, ear canal and external ear normal. There is no impacted cerumen.     Left Ear: Tympanic membrane, ear canal and external ear normal. There is no impacted cerumen.     Nose: Nose normal. No congestion or rhinorrhea.     Mouth/Throat:     Mouth: Mucous membranes are moist.     Pharynx: Oropharynx is clear.  Eyes:     Extraocular Movements: Extraocular movements intact.     Conjunctiva/sclera: Conjunctivae normal.     Pupils:  Pupils are equal, round, and reactive to light.  Neck:     Vascular: No carotid bruit.  Cardiovascular:     Rate and Rhythm: Normal rate and regular rhythm.     Pulses: Normal pulses.     Heart sounds: No murmur heard.    No friction rub. No gallop.  Pulmonary:     Effort: Pulmonary effort is normal.     Breath sounds: Normal breath sounds.  Abdominal:     General: Abdomen is flat. Bowel sounds are normal. There is no distension.     Palpations: Abdomen is soft. There is no mass.     Tenderness: There is no abdominal tenderness. There is no guarding or rebound.     Hernia: No hernia is present.  Musculoskeletal:     Cervical back: Neck supple. Bony tenderness present. Pain with movement present. Decreased range of motion.  Lymphadenopathy:     Cervical: No cervical adenopathy.  Skin:    General: Skin is warm and dry.     Capillary Refill: Capillary refill takes less than 2 seconds.  Neurological:     General: No focal deficit present.      Mental Status: She is alert and oriented to person, place, and time.  Psychiatric:        Mood and Affect: Mood normal.        Behavior: Behavior normal.        Thought Content: Thought content normal.        Judgment: Judgment normal.       Assessment & Plan:   1. Routine general medical examination at a health care facility Today patient counseled on age appropriate routine health concerns for screening and prevention, each reviewed and up to date or declined. Immunizations reviewed and up to date or declined. Labs ordered and reviewed. Risk factors for depression reviewed and negative. Hearing function and visual acuity are intact. ADLs screened and addressed as needed. Functional ability and level of safety reviewed and appropriate. Education, counseling and referrals performed based on assessed risks today. Patient provided with a copy of personalized plan for preventive services. - Follow up in one year or sooner if needed  - Work on exercise and healthy eating   2. Type 2 diabetes mellitus with complications (HCC) - Consider increase in Metformin  - CBC with Differential/Platelet; Future - Comprehensive metabolic panel; Future - Lipid panel; Future - TSH; Future - Microalbumin/Creatinine Ratio, Urine; Future - Hemoglobin A1c; Future  3. Essential hypertension - Well controled. No change in medication at this time  - Follow up with Cardiollogy as directed  - CBC with Differential/Platelet; Future - Comprehensive metabolic panel; Future - Lipid panel; Future - TSH; Future - hydrochlorothiazide (HYDRODIURIL) 25 MG tablet; Take 1 tablet (25 mg total) by mouth every morning.  Dispense: 90 tablet; Refill: 3  4. Hypothyroidism, unspecified type - Consider dose change of synthroid  - CBC with Differential/Platelet; Future - Comprehensive metabolic panel; Future - Lipid panel; Future - TSH; Future  5. Hyperlipidemia, unspecified hyperlipidemia type - Consider increase in  statin  - CBC with Differential/Platelet; Future - Comprehensive metabolic panel; Future - Lipid panel; Future - TSH; Future  6. Anxiety and depression - Continue with Celexa 5 mg  - CBC with Differential/Platelet; Future - Comprehensive metabolic panel; Future - Lipid panel; Future - TSH; Future  7. Mild intermittent asthma without complication - Continue with Trelegy 200 mcg  - CBC with Differential/Platelet; Future - Comprehensive metabolic panel; Future - Lipid  panel; Future - TSH; Future  8. Post menopausal syndrome - Continue with estrogen pack  - CBC with Differential/Platelet; Future - Comprehensive metabolic panel; Future - Lipid panel; Future - TSH; Future   [name], NP

## 2023-05-20 NOTE — Patient Instructions (Signed)
It was great seeing you today   We will follow up with you regarding your lab work   Please let me know if you need anything   

## 2023-05-23 ENCOUNTER — Other Ambulatory Visit: Payer: Self-pay | Admitting: Adult Health

## 2023-05-26 ENCOUNTER — Encounter: Payer: Self-pay | Admitting: Physical Therapy

## 2023-05-26 ENCOUNTER — Other Ambulatory Visit: Payer: Self-pay

## 2023-05-26 ENCOUNTER — Ambulatory Visit: Payer: PPO | Attending: Surgery | Admitting: Physical Therapy

## 2023-05-26 VITALS — BP 111/58 | HR 67

## 2023-05-26 DIAGNOSIS — M542 Cervicalgia: Secondary | ICD-10-CM | POA: Insufficient documentation
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
## 2023-06-05 ENCOUNTER — Ambulatory Visit: Payer: PPO | Admitting: Physical Therapy

## 2023-06-05 ENCOUNTER — Telehealth (INDEPENDENT_AMBULATORY_CARE_PROVIDER_SITE_OTHER): Payer: PPO | Admitting: Adult Health

## 2023-06-05 DIAGNOSIS — U071 COVID-19: Secondary | ICD-10-CM | POA: Diagnosis not present

## 2023-06-05 MED ORDER — MOLNUPIRAVIR EUA 200MG CAPSULE
4.0000 | ORAL_CAPSULE | Freq: Two times a day (BID) | ORAL | 0 refills | Status: AC
Start: 2023-06-05 — End: 2023-06-10

## 2023-06-05 NOTE — Progress Notes (Signed)
Virtual Visit via Video Note  I connected with  on 06/05/23 at 10:30 AM EDT by a video enabled telemedicine application and verified that I am speaking with the correct person using two identifiers.  Location patient: home Location provider:work or home office Persons participating in the virtual visit: patient, provider  I discussed the limitations of evaluation and management by telemedicine and the availability of in person appointments. The patient expressed understanding and agreed to proceed.   HPI: 71 year old female who  has a past medical history of Allergy, Anemia, Asthma, Bowen's disease, Chicken pox, DDD (degenerative disc disease), cervical, Depression, Diabetes mellitus without complication (HCC), Gallstones, GERD (gastroesophageal reflux disease), melanoma in situ (2002), UTI (urinary tract infection), Hyperlipidemia, Hypertension, Hypothyroidism, IBS (irritable bowel syndrome), Melanoma (HCC), Nephrolithiasis, Osteoarthritis, PVC's (premature ventricular contractions), and Status post dilation of esophageal narrowing.  She is being evaluated today for covid 19 infection. Her symptoms started five days ago. Her symptoms include that of fatigue, cough, loss of taste or smell and started to have diarrhea this morning. She tested positive the day her symptoms started. Symptoms are not getting any better  She denies fevers, chills, or body aches.   She has been using Robitussin for the cough    ROS: See pertinent positives and negatives per HPI.  Past Medical History:  Diagnosis Date   Allergy    Anemia    age 84 only   Asthma    Bowen's disease    ~ 17 yrs ago   Chicken pox    DDD (degenerative disc disease), cervical    Depression    Diabetes mellitus without complication (HCC)    on metformin - goes back andf forth   Gallstones    GERD (gastroesophageal reflux disease)    Hx of melanoma in situ 2002   Hx: UTI (urinary tract infection)    Hyperlipidemia     Hypertension    Hypothyroidism    IBS (irritable bowel syndrome)    with D   Melanoma (HCC)    x2    Nephrolithiasis    Osteoarthritis    PVC's (premature ventricular contractions)    Status post dilation of esophageal narrowing     Past Surgical History:  Procedure Laterality Date   ABDOMINAL HYSTERECTOMY  1974   APPENDECTOMY  1977   BLADDER SURGERY  1974   BREAST EXCISIONAL BIOPSY     CATARACT EXTRACTION Bilateral 2015   CHOLECYSTECTOMY  1987   COLONOSCOPY     RECTAL SURGERY     ~17 yrs ago- Bowens dx per pt with surgery   removal of melanoma in situ      Family History  Problem Relation Age of Onset   Hypertension Mother    Thyroid disease Mother    Arthritis Mother    Depression Mother    Hearing loss Mother    Miscarriages / Stillbirths Mother    Heart attack Father    Heart disease Father    Alcohol abuse Father    Arthritis Father    Depression Father    Drug abuse Father    Hypertension Father    Alcohol abuse Sister    Arthritis Sister    Depression Sister    Hearing loss Sister    Hypotension Sister    Depression Sister    Heart disease Sister    Hyperlipidemia Sister    Hypertension Maternal Aunt    Hypertension Maternal Uncle    Clotting disorder Maternal Uncle  Prostate cancer Maternal Uncle    Colon cancer Maternal Uncle    Colon polyps Maternal Uncle    Diabetes Maternal Uncle        all uncles   Arthritis Maternal Grandmother    Asthma Maternal Grandmother    Breast cancer Maternal Grandmother    Stomach cancer Maternal Grandmother    Hearing loss Maternal Grandmother    Diabetes Maternal Grandfather    Heart disease Maternal Grandfather    Arthritis Maternal Grandfather    Hearing loss Maternal Grandfather    Hypertension Maternal Grandfather    Kidney disease Maternal Grandfather    Hyperlipidemia Paternal Grandmother        also MI   Breast cancer Paternal Grandmother    Arthritis Paternal Grandmother    Heart attack  Paternal Grandmother    Hyperlipidemia Paternal Grandfather        also MI   Heart disease Paternal Grandfather    Heart attack Paternal Grandfather    Endometriosis Daughter    Hypertension Son    Esophageal cancer Neg Hx    Rectal cancer Neg Hx        Current Outpatient Medications:    albuterol (VENTOLIN HFA) 108 (90 Base) MCG/ACT inhaler, Inhale 2 puffs into the lungs every 4 (four) hours as needed for wheezing or shortness of breath., Disp: 18 g, Rfl: 1   amLODipine (NORVASC) 5 MG tablet, TAKE ONE TABLET BY MOUTH ONCE DAILY, Disp: 90 tablet, Rfl: 3   blood glucose meter kit and supplies, Dispense based on patient and insurance preference. Use up to four times daily as directed. (FOR ICD-10 E10.9, E11.9)., Disp: 1 each, Rfl: 0   budesonide (RHINOCORT AQUA) 32 MCG/ACT nasal spray, Place 1 spray into both nostrils daily as needed for allergies. , Disp: , Rfl:    calcium carbonate (OSCAL) 1500 (600 Ca) MG TABS tablet, Take by mouth 2 (two) times daily with a meal., Disp: , Rfl:    carvedilol (COREG) 25 MG tablet, TAKE ONE TABLET BY MOUTH EVERY MORNING and TAKE ONE TABLET BY MOUTH EVERY EVENING, Disp: 180 tablet, Rfl: 3   Cholecalciferol (VITAMIN D) 50 MCG (2000 UT) tablet, Take 2,000 Units by mouth daily. , Disp: , Rfl:    citalopram (CELEXA) 10 MG tablet, TAKE 1/2 TABLET BY MOUTH EVERY MORNING, Disp: 45 tablet, Rfl: 11   Continuous Blood Gluc Sensor (FREESTYLE LIBRE 2 SENSOR) MISC, USE TO check blood glucose AND CHANGE sensor every 14 DAYS, Disp: 2 each, Rfl: 11   cycloSPORINE (RESTASIS) 0.05 % ophthalmic emulsion, Place 1 drop into both eyes 2 (two) times daily., Disp: , Rfl:    estradiol (DOTTI) 0.05 MG/24HR patch, PLACE ONE PATCH onto THE SKIN TWICE WEEKLY, Disp: 8 patch, Rfl: 2   famotidine (PEPCID) 20 MG tablet, Take 1 tablet (20 mg total) by mouth in the morning., Disp: 90 tablet, Rfl: 3   fexofenadine (ALLEGRA) 180 MG tablet, Take 1 tablet (180 mg total) by mouth daily as needed  for allergies or rhinitis (Can take an extra dose during flare ups.)., Disp: 180 tablet, Rfl: 3   Fluticasone-Umeclidin-Vilant (TRELEGY ELLIPTA) 200-62.5-25 MCG/ACT AEPB, Inhale 1 puff into the lungs in the morning., Disp: 180 each, Rfl: 3   hydrochlorothiazide (HYDRODIURIL) 25 MG tablet, Take 1 tablet (25 mg total) by mouth every morning., Disp: 90 tablet, Rfl: 3   irbesartan (AVAPRO) 150 MG tablet, TAKE ONE TABLET BY MOUTH TWICE DAILY, Disp: 180 tablet, Rfl: 3   Lancets (ONETOUCH DELICA PLUS  LANCET33G) MISC, USE TO check blood glucose UP TO four times daily AS DIRECTED, Disp: 100 each, Rfl: 0   levothyroxine (SYNTHROID) 50 MCG tablet, TAKE ONE TABLET BY MOUTH EVERY MORNING, Disp: 90 tablet, Rfl: 2   metFORMIN (GLUCOPHAGE) 500 MG tablet, TAKE ONE TABLET BY MOUTH TWICE DAILY WITH A MEAL, Disp: 180 tablet, Rfl: 11   minoxidil (ROGAINE) 2 % external solution, Apply topically daily., Disp: , Rfl:    Multiple Vitamins-Minerals (ALIVE WOMENS GUMMY PO), Take 1 tablet by mouth daily., Disp: , Rfl:    multivitamin-lutein (OCUVITE-LUTEIN) CAPS capsule, Take 1 capsule by mouth daily., Disp: , Rfl:    naproxen (NAPROSYN) 500 MG tablet, Take 1 tablet (500 mg total) by mouth 2 (two) times daily with a meal., Disp: 60 tablet, Rfl: 0   ONETOUCH ULTRA test strip, USE TO check blood glucose UP TO four times daily AS DIRECTED, Disp: 100 strip, Rfl: 0   potassium chloride (KLOR-CON) 10 MEQ tablet, TAKE ONE TABLET BY MOUTH EVERY EVENING, Disp: 90 tablet, Rfl: 2   rosuvastatin (CRESTOR) 10 MG tablet, TAKE ONE TABLET BY MOUTH every other evening, Disp: 90 tablet, Rfl: 2   TURMERIC PO, Take 500 mg by mouth daily. , Disp: , Rfl:   EXAM:  VITALS per patient if applicable:  GENERAL: alert, oriented, appears well and in no acute distress  HEENT: atraumatic, conjunttiva clear, no obvious abnormalities on inspection of external nose and ears  NECK: normal movements of the head and neck  LUNGS: on inspection no signs of  respiratory distress, breathing rate appears normal, no obvious gross SOB, gasping or wheezing  CV: no obvious cyanosis  MS: moves all visible extremities without noticeable abnormality  PSYCH/NEURO: pleasant and cooperative, no obvious depression or anxiety, speech and thought processing grossly intact  ASSESSMENT AND PLAN:  Discussed the following assessment and plan:  1. COVID-19 virus infection  - molnupiravir EUA (LAGEVRIO) 200 mg CAPS capsule; Take 4 capsules (800 mg total) by mouth 2 (two) times daily for 5 days.  Dispense: 40 capsule; Refill: 0 - Continue Robitussin for cough - Follow up in 3-4 days if no improvement   I discussed the assessment and treatment plan with the patient. The patient was provided an opportunity to ask questions and all were answered. The patient agreed with the plan and demonstrated an understanding of the instructions.   The patient was advised to call back or seek an in-person evaluation if the symptoms worsen or if the condition fails to improve as anticipated.   [name], NP

## 2023-06-11 ENCOUNTER — Encounter: Payer: Self-pay | Admitting: Physical Therapy

## 2023-06-11 ENCOUNTER — Ambulatory Visit: Payer: PPO | Admitting: Physical Therapy

## 2023-06-11 VITALS — BP 118/77 | HR 66

## 2023-06-11 DIAGNOSIS — M542 Cervicalgia: Secondary | ICD-10-CM
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
## 2023-06-11 NOTE — Patient Instructions (Signed)
Access Code: [email or web address] URL: [email or web address] Date: 06/11/2023 Prepared by: Camille Bal  Exercises - Seated Upper Trapezius Stretch  - 1 x daily - 4 x weekly - 1 sets - 2 reps - 1 minute hold - Seated Assisted Cervical Rotation with Towel  - 1 x daily - 4 x weekly - 1 sets - 12 reps - Supine Chin Tuck  - 1 x daily - 5 x weekly - 2 sets - 15 reps - 2 seconds hold - Supine Cervical Retraction with Towel  - 1 x daily - 4-5 x weekly - 2 sets - 12 reps - 1 second hold - Supine Cervical Flexion Extension on Pillow  - 1 x daily - 4 x weekly - 2 sets - 15 reps

## 2023-06-13 ENCOUNTER — Other Ambulatory Visit: Payer: Self-pay | Admitting: Internal Medicine

## 2023-06-16 ENCOUNTER — Ambulatory Visit: Payer: PPO | Attending: Internal Medicine | Admitting: Internal Medicine

## 2023-06-16 VITALS — BP 128/72 | HR 62 | Ht 63.0 in | Wt 197.8 lb

## 2023-06-16 DIAGNOSIS — I10 Essential (primary) hypertension: Secondary | ICD-10-CM

## 2023-06-16 DIAGNOSIS — I493 Ventricular premature depolarization: Secondary | ICD-10-CM | POA: Diagnosis not present

## 2023-06-16 DIAGNOSIS — I479 Paroxysmal tachycardia, unspecified: Secondary | ICD-10-CM | POA: Diagnosis not present

## 2023-06-16 NOTE — Patient Instructions (Signed)
Medication Instructions:  Your physician recommends that you continue on your current medications as directed. Please refer to the Current Medication list given to you today.  *If you need a refill on your cardiac medications before your next appointment, please call your pharmacy*   Lab Work: NONE   If you have labs (blood work) drawn today and your tests are completely normal, you will receive your results only by: MyChart Message (if you have MyChart) OR A paper copy in the mail If you have any lab test that is abnormal or we need to change your treatment, we will call you to review the results.   Testing/Procedures: NONE    Follow-Up: At [facility], you and your health needs are our priority.  As part of our continuing mission to provide you with exceptional heart care, we have created designated Provider Care Teams.  These Care Teams include your primary Cardiologist (physician) and Advanced Practice Providers (APPs -  Physician Assistants and Nurse Practitioners) who all work together to provide you with the care you need, when you need it.  We recommend signing up for the patient portal called "MyChart".  Sign up information is provided on this After Visit Summary.  MyChart is used to connect with patients for Virtual Visits (Telemedicine).  Patients are able to view lab/test results, encounter notes, upcoming appointments, etc.  Non-urgent messages can be sent to your provider as well.   To learn more about what you can do with MyChart, go to [email or web address].    Your next appointment:   1 year(s)  Provider:   Chrystie Nose, MD     Other Instructions Thank you for choosing [facility]!

## 2023-06-16 NOTE — Progress Notes (Signed)
OFFICE NOTE  Chief Complaint:  Follow-up  Primary Care Physician: [name], NP  HPI:  Amber Brewer is a 72 y.o. female with a past medical history significant for hypertension, dyslipidemia, asthma/allergies, GERD, Schatzki's ring and prior esophageal dilatation about 5 years ago.  She now presents with recent episodes of squeezing in the chest which feel episodic and spasmodic. She is concerned that this may be esophageal spasm however her primary care provider wanted to rule out possible coronary disease. She feels a tightness in her chest that is not necessarily associated with eating or present with exertion or relieved by rest. She is also short of breath, particularly when walking up hills. The tightness is been coming on more recently. She's also been under significant stress and does have some history of anxiety. She is caring for disabled son and has an 82 year old mother who she is caring for. 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 She reports that she does have some mild trouble swallowing and occasionally food does get stuck in her mid throat and often times pills are difficult to swallow and may come up. She also reported a brief presyncopal episode however this is with significantly rotating her head about 90 the cause that quick decrease in vision which improved when straightening her head out. She does have a history of neck injury in the past.  02/26/2016  Amber Brewer returns today for follow-up of her nuclear stress test. This is an exercise study in which no ischemia was noted. LVEF was greater than 65%. She did have a hypertensive response to exercise with a peak systolic blood pressure 230. Blood pressure appears that is not as well-controlled as it should be. We discussed her chest discomfort which she says she is had some improvement in. Given her prior esophageal dilatation history and Schatzki's ring, she may be having esophageal spasm.  I asked her to follow-up with her gastroenterologist about this. She may need manometry. If this is the case then she may benefit from addition of low-dose calcium channel blocker. She does not want to add any extra medication at this time, however blood pressure is suboptimally controlled.  05/27/2016  Amber Brewer returns today for follow-up. She continues to have some anxiety episodes. This is associated with some throat tightness. She also has a history of esophageal stricture and Schatzki's ring. She has made an appointment with her gastroenterologist but that is not until September. It's unclear whether these episodes are GI related or anxiety. I recently increased her Bystolic to 7.5 mg daily. Her home blood pressure readings indicate good control.  11/08/2015  Amber Brewer was seen today as an add-on for feeling faint and having palpitations. An EKG in the office shows frequent unifocal PVCs. This is symptomatically similar to what she had when I saw her back in August however she is having more frequent episodes. She is also under significant stress. Her husband recently had intracranial hemorrhage and had emergency brain surgery. She also takes care of her elderly mother and disabled child. This causes a lot of stress in her life. She's had asthma however she is on inhaled steroid and rarely uses a beta agonist. She is on Bystolic however that has very poor ability to block PVCs. LV function was normal this past year in April 2017 by stress test without any ischemia, therefore not concerned about these being ischemic PVCs.  11/11/2016  Amber Brewer returns today for follow-up. She reports symptomatic  improvement in her palpitations on carvedilol. Blood pressure is low normal. She was getting a little dizzy and move her blood pressure medicines to the middle of the day. She reports about 50% improvement in her palpitations. She says she is breathing better and has more energy.  12/19/2016  [name] is seen today in follow-up. Overall she feels much better. She is actually made adjustments or other medication at home. She felt like a higher dose of carvedilol was robbing her of energy. Not quite as bad as being on diastolic but certainly worse. She therefore decreased the dose and currently is only taking 12.5 mg every morning. She subsequently increase the dose of her irbesartan up to 300 mg daily. Blood pressure control is been excellent with most readings between 110 and 120 systolic. She denies any worsening palpitations. She is generally pleased with how she feels on this current medication regimen. I did report to her that carvedilol is typically twice daily medicine because of the short half-life and we discussed the possibility of dividing the dose twice daily.  06/17/2017  Amber Brewer is seen today in follow-up. She reports improvement in her palpitations on carvedilol however they're not as well-controlled as they have been in the past. She's previously been on an increased dose of the carvedilol and felt that it was robbing her energy. Is difficult to find a balance between the 2 medications. She's been struggling with vestibular problems and is undergoing neuro physical therapy. She says this is been helpful for her.  01/16/2021  Amber Brewer returns today for follow-up.  I saw her last via telemedicine visit.  I had recommended increasing her irbesartan to 300 mg daily.  She notes that at home her blood pressures generally in the 120s 130 systolic range although was more elevated today 142/73.  She also has lost a significant amount of weight.  Some of this was intentional and some of it not because she had COVID-19 in January and she was taking care of her mother who ultimately died of that.  Her cholesterol however is come down substantially.  Total was 154, triglycerides 67 HDL 56 and LDL of 73, this is down from 171 previously.  She was taken the rosuvastatin 10 mg daily but was  advised by her PCP to drop that back to once weekly.  I do think we could cut back on it however I would be hesitant to say that this was all from diet and it will likely increase with a significant reduction in her rosuvastatin.   07/03/2021  Amber Brewer is seen today in follow-up.  Overall she says she feels very well.  She does not have any significant palpitations in some time.  EKG today shows no PVCs.  Weight is unfortunately crept up some as is her blood pressure.  Today it was initially elevated however recheck came down to 140/78.  She said this is more in line with her home readings.  Cholesterol however just recently checked is still pretty good with total 137, HDL 52, triglycerides 104 and LDL 66.  Her A1c was 5.4 and has come up to 6.6 as of July.  This has been associated with weight gain.  This past year has been difficult with several losses in her family including her mother and she contracted COVID-19.  She is now just starting to feel that she could become more active and work on weight loss.  10/17/2021  Amber Brewer seen today in follow-up.  Recently  she has had some issues with hypertension.  She had some systolic blood pressures over 742 and was referred to the emergency department.  This required 2 ER visits and ultimately she was placed on higher dose amlodipine as she cannot get an appointment with our office.  She says that she has been on the higher dose amlodipine she seems to be tolerating this pretty well.  Its been about a week on that medication.  She has developed some lower extremity edema by the end of the day but is tolerable.  Blood pressure today in the office was 127/72.  I have encouraged her to continue to follow this as the medication should be kicking in and around this point.  06/03/2022  Amber Brewer is seen today in follow-up.  She called our office back in March of this year having had some palpitations.  They were occurring a little more frequently.  She thought  it might be related to a change in the amlodipine.  I did not feel that it was related but it may be coincidental.  Since then she says they have improved quite a bit.  They happen fairly infrequently.  She does have a history of PVCs in the past and I suspect that this.  She says it only lasts a few seconds and may be only a few times a week.  Blood pressure is now much better controlled.  After decreasing amlodipine her edema has improved significantly.  She is quite happy with her current blood pressure control today at 124/78.  Recent lipids and July showed total cholesterol 176, HDL 63, triglycerides 73 and LDL 99.  She denies any chest pain or shortness of breath.  06/16/2023  Amber Brewer returns today for follow-up.  She is overall doing well without complaints.  She has been undergoing some neurosurgery rehabilitation although may need some injections of her neck.  She had recent lab work with her primary care provider.  This was in July.  A1c 6.3%, LDL was 76.  Blood pressure is well-controlled today.  She reports good control of her palpitations on carvedilol.  PMHx:  Past Medical History:  Diagnosis Date   Allergy    Anemia    age 4 only   Asthma    Bowen's disease    ~ 17 yrs ago   Chicken pox    DDD (degenerative disc disease), cervical    Depression    Diabetes mellitus without complication (HCC)    on metformin - goes back andf forth   Gallstones    GERD (gastroesophageal reflux disease)    Hx of melanoma in situ 2002   Hx: UTI (urinary tract infection)    Hyperlipidemia    Hypertension    Hypothyroidism    IBS (irritable bowel syndrome)    with D   Melanoma (HCC)    x2    Nephrolithiasis    Osteoarthritis    PVC's (premature ventricular contractions)    Status post dilation of esophageal narrowing     Past Surgical History:  Procedure Laterality Date   ABDOMINAL HYSTERECTOMY  1974   APPENDECTOMY  1977   BLADDER SURGERY  1974   BREAST EXCISIONAL BIOPSY      CATARACT EXTRACTION Bilateral 2015   CHOLECYSTECTOMY  1987   COLONOSCOPY     RECTAL SURGERY     ~17 yrs ago- Bowens dx per pt with surgery   removal of melanoma in situ      FAMHx:  Family History  Problem Relation Age of Onset   Hypertension Mother    Thyroid disease Mother    Arthritis Mother    Depression Mother    Hearing loss Mother    Miscarriages / Stillbirths Mother    Heart attack Father    Heart disease Father    Alcohol abuse Father    Arthritis Father    Depression Father    Drug abuse Father    Hypertension Father    Alcohol abuse Sister    Arthritis Sister    Depression Sister    Hearing loss Sister    Hypotension Sister    Depression Sister    Heart disease Sister    Hyperlipidemia Sister    Hypertension Maternal Aunt    Hypertension Maternal Uncle    Clotting disorder Maternal Uncle    Prostate cancer Maternal Uncle    Colon cancer Maternal Uncle    Colon polyps Maternal Uncle    Diabetes Maternal Uncle        all uncles   Arthritis Maternal Grandmother    Asthma Maternal Grandmother    Breast cancer Maternal Grandmother    Stomach cancer Maternal Grandmother    Hearing loss Maternal Grandmother    Diabetes Maternal Grandfather    Heart disease Maternal Grandfather    Arthritis Maternal Grandfather    Hearing loss Maternal Grandfather    Hypertension Maternal Grandfather    Kidney disease Maternal Grandfather    Hyperlipidemia Paternal Grandmother        also MI   Breast cancer Paternal Grandmother    Arthritis Paternal Grandmother    Heart attack Paternal Grandmother    Hyperlipidemia Paternal Grandfather        also MI   Heart disease Paternal Grandfather    Heart attack Paternal Grandfather    Endometriosis Daughter    Hypertension Son    Esophageal cancer Neg Hx    Rectal cancer Neg Hx     SOCHx:   reports that she has never smoked. She has never used smokeless tobacco. She reports that she does not drink alcohol and does not use  drugs.  ALLERGIES:  Allergies  Allergen Reactions   Codeine Phosphate Nausea And Vomiting    REACTION: unspecified   Prednisone Shortness Of Breath   Betadine [Povidone Iodine] Hives    After surgical skin prep   Sulfa Antibiotics Nausea And Vomiting   Iodine Rash    Broke out on skin once years ago--**Betadine Skin Prep**    ROS: Pertinent items noted in HPI and remainder of comprehensive ROS otherwise negative.  HOME MEDS: Current Outpatient Medications  Medication Sig Dispense Refill   albuterol (VENTOLIN HFA) 108 (90 Base) MCG/ACT inhaler Inhale 2 puffs into the lungs every 4 (four) hours as needed for wheezing or shortness of breath. 18 g 1   amLODipine (NORVASC) 5 MG tablet TAKE ONE TABLET BY MOUTH ONCE DAILY 90 tablet 3   blood glucose meter kit and supplies Dispense based on patient and insurance preference. Use up to four times daily as directed. (FOR ICD-10 E10.9, E11.9). 1 each 0   budesonide (RHINOCORT AQUA) 32 MCG/ACT nasal spray Place 1 spray into both nostrils daily as needed for allergies.      calcium carbonate (OSCAL) 1500 (600 Ca) MG TABS tablet Take by mouth 2 (two) times daily with a meal.     carvedilol (COREG) 25 MG tablet TAKE 1 TABLET BY MOUTH TWICE DAILY *REFILL REQUEST* 180 tablet 0   Cholecalciferol (VITAMIN  D) 50 MCG (2000 UT) tablet Take 2,000 Units by mouth daily.      citalopram (CELEXA) 10 MG tablet TAKE 1/2 TABLET BY MOUTH EVERY MORNING 45 tablet 11   Continuous Blood Gluc Sensor (FREESTYLE LIBRE 2 SENSOR) MISC USE TO check blood glucose AND CHANGE sensor every 14 DAYS 2 each 11   cycloSPORINE (RESTASIS) 0.05 % ophthalmic emulsion Place 1 drop into both eyes 2 (two) times daily.     estradiol (DOTTI) 0.05 MG/24HR patch PLACE ONE PATCH onto THE SKIN TWICE WEEKLY 8 patch 2   famotidine (PEPCID) 20 MG tablet Take 1 tablet (20 mg total) by mouth in the morning. 90 tablet 3   Fluticasone-Umeclidin-Vilant (TRELEGY ELLIPTA) 200-62.5-25 MCG/ACT AEPB Inhale 1  puff into the lungs in the morning. 180 each 3   hydrochlorothiazide (HYDRODIURIL) 25 MG tablet Take 1 tablet (25 mg total) by mouth every morning. 90 tablet 3   irbesartan (AVAPRO) 150 MG tablet Take 1 tablet (150 mg total) by mouth daily. 180 tablet 0   Lancets (ONETOUCH DELICA PLUS LANCET33G) MISC USE TO check blood glucose UP TO four times daily AS DIRECTED 100 each 0   levothyroxine (SYNTHROID) 50 MCG tablet TAKE ONE TABLET BY MOUTH EVERY MORNING 90 tablet 2   loratadine (CLARITIN) 5 MG chewable tablet Chew 5 mg by mouth daily.     metFORMIN (GLUCOPHAGE) 500 MG tablet TAKE ONE TABLET BY MOUTH TWICE DAILY WITH A MEAL 180 tablet 11   minoxidil (ROGAINE) 2 % external solution Apply topically daily.     Multiple Vitamins-Minerals (ALIVE WOMENS GUMMY PO) Take 1 tablet by mouth daily.     multivitamin-lutein (OCUVITE-LUTEIN) CAPS capsule Take 1 capsule by mouth daily.     naproxen (NAPROSYN) 500 MG tablet Take 1 tablet (500 mg total) by mouth 2 (two) times daily with a meal. 60 tablet 0   ONETOUCH ULTRA test strip USE TO check blood glucose UP TO four times daily AS DIRECTED 100 strip 0   potassium chloride (KLOR-CON) 10 MEQ tablet TAKE ONE TABLET BY MOUTH EVERY EVENING 90 tablet 2   rosuvastatin (CRESTOR) 10 MG tablet TAKE ONE TABLET BY MOUTH every other evening 90 tablet 2   TURMERIC PO Take 500 mg by mouth daily.      fexofenadine (ALLEGRA) 180 MG tablet Take 1 tablet (180 mg total) by mouth daily as needed for allergies or rhinitis (Can take an extra dose during flare ups.). (Patient not taking: Reported on 06/16/2023) 180 tablet 3   No current facility-administered medications for this visit.    LABS/IMAGING: No results found for this or any previous visit (from the past 48 hour(s)). No results found.  WEIGHTS: Wt Readings from Last 3 Encounters:  06/16/23 197 lb 12.8 oz (89.7 kg)  05/20/23 197 lb (89.4 kg)  05/01/23 194 lb (88 kg)    VITALS: BP 128/72 (BP Location: Left Arm,  Patient Position: Sitting, Cuff Size: Normal)   Pulse 62   Ht 5\' 3"  (1.6 m)   Wt 197 lb 12.8 oz (89.7 kg)   SpO2 97%   BMI 35.04 kg/m   EXAM: General appearance: alert and no distress Neck: no carotid bruit and no JVD Lungs: clear to auscultation bilaterally Heart: regular rate and rhythm, S1, S2 normal, no murmur, click, rub or gallop Abdomen: soft, non-tender; bowel sounds normal; no masses,  no organomegaly Extremities: extremities normal, atraumatic, no cyanosis or edema Pulses: 2+ and symmetric Skin: Skin color, texture, turgor normal. No rashes or  lesions Neurologic: Grossly normal Psych: Pleasant  EKG: EKG Interpretation Date/Time:  Monday June 16 2023 08:56:21 EDT Ventricular Rate:  60 PR Interval:  174 QRS Duration:  88 QT Interval:  438 QTC Calculation: 438 R Axis:   60  Text Interpretation: Normal sinus rhythm Cannot rule out Anterior infarct , age undetermined When compared with ECG of 08-Mar-2003 08:38, Nonspecific T wave abnormality, worse in Anterolateral leads Confirmed by Zoila Shutter [phone]) on 06/16/2023 9:13:09 AM    ASSESSMENT: Frequent symptomatic PVC's - improved Chest pain, more likely esophageal spasm - low risk exercise nuclear stress test (2017) Hypertension Dyslipidemia Neuro-vestibular imbalance  PLAN: 1.  Amber Brewer seems to be doing well from a cardiac standpoint.  No significant PVCs were noted.  She denies any chest pain.  Blood pressure is well-controlled and her LDL is 76 which represents good control.  No changes to her medications today.    Follow-up with me annually or sooner as necessary.  Chrystie Nose, MD, [facility], FACP  [facility]  Medical Director of the [facility]  [facility] Diplomate of the American Board of Clinical Lipidology Attending Cardiologist  Direct Dial: [phone]  Fax: [phone]  Website:  [email or web address] 06/16/2023,  9:13 AM

## 2023-06-17 ENCOUNTER — Other Ambulatory Visit: Payer: Self-pay | Admitting: Internal Medicine

## 2023-06-17 ENCOUNTER — Other Ambulatory Visit: Payer: Self-pay | Admitting: Adult Health

## 2023-06-18 ENCOUNTER — Ambulatory Visit: Payer: PPO | Admitting: Physical Therapy

## 2023-06-18 DIAGNOSIS — M542 Cervicalgia: Secondary | ICD-10-CM | POA: Diagnosis not present

## 2023-06-18 NOTE — Therapy (Signed)
OUTPATIENT PHYSICAL THERAPY CERVICAL TREATMENT   Patient Name: Amber Brewer MRN: [id number] DOB:July 19, 1951, 72 y.o., female Today's Date: 06/18/2023  END OF SESSION:  PT End of Session - 06/18/23 1403     Visit Number 3    Number of Visits 7   6 + eval   Date for PT Re-Evaluation 07/18/23   pushed out due to scheduling delay   Authorization Type [employer]    PT Start Time 1400    PT Stop Time 1445    PT Time Calculation (min) 45 min    Activity Tolerance Patient tolerated treatment well    Behavior During Therapy [facility] for tasks assessed/performed              PT End of Session - 06/18/23 1403     Visit Number 3    Number of Visits 7   6 + eval   Date for PT Re-Evaluation 07/18/23   pushed out due to scheduling delay   Authorization Type [employer]    PT Start Time 1400    PT Stop Time 1445    PT Time Calculation (min) 45 min    Activity Tolerance Patient tolerated treatment well    Behavior During Therapy WFL for tasks assessed/performed              Past Medical History:  Diagnosis Date   Allergy    Anemia    age 88 only   Asthma    Bowen's disease    ~ 17 yrs ago   Chicken pox    DDD (degenerative disc disease), cervical    Depression    Diabetes mellitus without complication (HCC)    on metformin - goes back andf forth   Gallstones    GERD (gastroesophageal reflux disease)    Hx of melanoma in situ 2002   Hx: UTI (urinary tract infection)    Hyperlipidemia    Hypertension    Hypothyroidism    IBS (irritable bowel syndrome)    with D   Melanoma (HCC)    x2    Nephrolithiasis    Osteoarthritis    PVC's (premature ventricular contractions)    Status post dilation of esophageal narrowing    Past Surgical History:  Procedure Laterality Date   ABDOMINAL HYSTERECTOMY  1974   APPENDECTOMY  1977   BLADDER SURGERY  1974   BREAST EXCISIONAL BIOPSY     CATARACT EXTRACTION Bilateral 2015   CHOLECYSTECTOMY  1987    COLONOSCOPY     RECTAL SURGERY     ~17 yrs ago- Bowens dx per pt with surgery   removal of melanoma in situ     Patient Active Problem List   Diagnosis Date Noted   Myofascial pain syndrome, cervical 06/03/2018   PVC's (premature ventricular contractions) 10/31/2016   Benign hypertension 10/31/2016   Anxiety 10/31/2016   Hyperlipidemia 05/27/2016   LEG PAIN, BILATERAL 06/07/2010   LEG EDEMA, BILATERAL 10/04/2009   HYPOKALEMIA 07/20/2009   ALLERGY, HX OF 11/24/2008   Anxiety disorder 05/19/2008   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   Pain in the chest 05/19/2008   UNSPECIFIED PAROXYSMAL TACHYCARDIA 03/31/2008   HYPOTHYROIDISM, BORDERLINE 12/16/2007   PANIC ATTACK 06/04/2007   DISORDER, 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   Essential hypertension 05/25/2007   OSTEOARTHRITIS 05/25/2007   NEPHROLITHIASIS, HX OF 05/25/2007   ARTHRITIS, HX OF 05/25/2007    PCP: [name], NP  REFERRING PROVIDER: Clovis Riley, PA-C  REFERRING DIAG: M54.2 (ICD-10-CM) - Cervicalgia  THERAPY DIAG:  Cervicalgia  Rationale for Evaluation and Treatment: Rehabilitation  ONSET DATE: neck has been hurting since January 2024  SUBJECTIVE:                                                                                                                                                                                                         SUBJECTIVE STATEMENT: Pt reports her pain is getting worse, today it is 7/10 in posterior and lateral portion of her neck, was having it just in lateral portion but now in posterior portion as well. Additionally, pt reports she has had 4 migraines over the past week. Pt also gets pain across the top of her head. Pt describes it as feeling like she is in a vice, finds hard to sleep because can't find a comfortable position. Pt feels like the exercises especially the cervical extension have made her pain worse and caused increased tightness.  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 Pt notices that she clenches her jaw at times and can feel tight muscles in her body when she is trying to sleep, has to consciously work on relaxing them.  Hand dominance: Right  PERTINENT HISTORY:  Anxiety, labyrinth nec disorder, HTN, BLE edema, PVCs, treated for melanoma 10 years ago  PAIN:  Are you having pain? Yes: NPRS scale: 5/10 Pain location: bilateral lower neck Pain description: constant, stiff/hard block Aggravating factors: turning neck - left worse than right Relieving factors: keeping neck in line with body  PRECAUTIONS: Fall  RED FLAGS: None     WEIGHT BEARING RESTRICTIONS: No  FALLS:  Has patient fallen in last 6 months? No  LIVING ENVIRONMENT: Lives with: lives with their spouse, lives with their son, and has a large and small dog to care for Lives in: House/apartment Stairs: Yes: Internal: 17 steps; on right going up and External: 6 steps; on right going up Has following equipment at home:  having grab bars installed  OCCUPATION: Obtician - retired 3 weeks ago  PLOF: Independent  PATIENT GOALS: "To kind of relieve the pain"  "to give me more ROM"  NEXT MD VISIT: 06/16/2023 w/ Dr. Zoila Shutter  OBJECTIVE:   DIAGNOSTIC FINDINGS:  Cervical MRI from 12/28/2022: IMPRESSION: 1. At C4-5 there is a broad-based disc bulge. Bilateral uncovertebral degenerative changes. Severe right foraminal stenosis. Mild left foraminal stenosis. Mild spinal stenosis. 2. At C5-6 there is a broad-based disc bulge. Bilateral uncovertebral degenerative changes.  Moderate spinal stenosis. Severe bilateral foraminal stenosis. 3. No acute osseous injury of the cervical spine.  PATIENT SURVEYS:  NDI 15/50 = mild perceived disability  COGNITION: Overall cognitive status: Within functional limits for tasks assessed  SENSATION: Light touch: WFL  POSTURE: rounded shoulders and  forward head  PALPATION: Tender around C7 (pt has Dowager's hump), tightness in bilateral upper traps   CERVICAL ROM:   Active ROM A/PROM (deg) eval  Flexion 51  Extension 25  Right lateral flexion 39  Left lateral flexion 38  Right rotation 34  Left rotation 16   (Blank rows = not tested)  UPPER EXTREMITY ROM:  Active ROM Right eval Left eval  Shoulder flexion WNL WNL  Shoulder extension    Shoulder abduction " "  Shoulder adduction    Shoulder extension    Shoulder internal rotation    Shoulder external rotation    Elbow flexion " "  Elbow extension    Wrist flexion    Wrist extension    Wrist ulnar deviation    Wrist radial deviation    Wrist pronation    Wrist supination     (Blank rows = not tested)  UPPER EXTREMITY MMT:  MMT Right eval Left eval  Shoulder flexion 5/5 5/5  Shoulder extension    Shoulder abduction    Shoulder adduction    Shoulder extension    Shoulder internal rotation    Shoulder external rotation    Middle trapezius    Lower trapezius    Elbow flexion    Elbow extension    Wrist flexion    Wrist extension    Wrist ulnar deviation    Wrist radial deviation    Wrist pronation    Wrist supination    Grip strength     (Blank rows = not tested)  CERVICAL SPECIAL TESTS:  Neck flexor muscle endurance test: pt does not tolerate positioning for test well per report.  FUNCTIONAL TESTS:  None relevant to chief complaint.  TODAY'S TREATMENT:                                                                                                                               TherAct Trigger Point Dry-Needling  Treatment instructions: Expect mild to moderate muscle soreness. S/S of pneumothorax if dry needled over a lung field, and to seek immediate medical attention should they occur. Patient verbalized understanding of these instructions and education.  Patient Consent Given: Yes Education handout provided: Yes Muscles treated: L and R  suboccipitals, L and R splenius Treatment response/outcome: deep ache/muscle cramp; muscle twitch detected; pt reports increased cervical rotation   Trigger Point Dry Needling  What is Trigger Point Dry Needling (DN)? DN is a physical therapy technique used to treat muscle pain and dysfunction. Specifically, DN helps deactivate muscle trigger points (muscle knots).  A thin filiform needle is used to penetrate the skin and stimulate the underlying trigger point. The goal  is for a local twitch response (LTR) to occur and for the trigger point to relax. No medication of any kind is injected during the procedure.   What Does Trigger Point Dry Needling Feel Like?  The procedure feels different for each individual patient. Some patients report that they do not actually feel the needle enter the skin and overall the process is not painful. Very mild bleeding may occur. However, many patients feel a deep cramping in the muscle in which the needle was inserted. This is the local twitch response.   How Will I feel after the treatment? Soreness is normal, and the onset of soreness may not occur for a few hours. Typically this soreness does not last longer than two days.  Bruising is uncommon, however; ice can be used to decrease any possible bruising.  In rare cases feeling tired or nauseous after the treatment is normal. In addition, your symptoms may get worse before they get better, this period will typically not last longer than 24 hours.   What Can I do After My Treatment? Increase your hydration by drinking more water for the next 24 hours. You may place ice or heat on the areas treated that have become sore, however, do not use heat on inflamed or bruised areas. Heat often brings more relief post needling. You can continue your regular activities, but vigorous activity is not recommended initially after the treatment for 24 hours. DN is best combined with other physical therapy such as  strengthening, stretching, and other therapies.    Manual Therapy Suboccipital release 4 x 30 sec  Trial of chirp wheel x 5 min. Provided printout for where to purchase chirp wheel as pt does feel some relief of pain with use of this device.   PATIENT EDUCATION:  Education details: continue HEP as tolerated except for cervical extension exercise, TPDN (see above) Person educated: Patient Education method: Explanation and Handouts Education comprehension: verbalized understanding  HOME EXERCISE PROGRAM: Access Code: [email or web address] URL: [email or web address] Date: 06/11/2023 Prepared by: Camille Bal  Exercises - Seated Upper Trapezius Stretch  - 1 x daily - 4 x weekly - 1 sets - 2 reps - 1 minute hold - Seated Assisted Cervical Rotation with Towel  - 1 x daily - 4 x weekly - 1 sets - 12 reps - Supine Chin Tuck  - 1 x daily - 5 x weekly - 2 sets - 15 reps - 2 seconds hold - Supine Cervical Retraction with Towel  - 1 x daily - 4-5 x weekly - 2 sets - 12 reps - 1 second hold - Supine Cervical Flexion Extension on Pillow  - 1 x daily - 4 x weekly - 2 sets - 15 reps   ASSESSMENT:  CLINICAL IMPRESSION: Emphasis of skilled PT session on initiating TPDN as well as working on manual therapy and use of chirp wheel to address muscle tightness in attempt to reduce pain. Pt with good response to TPDN this date with increased cervical rotation noted. Pt continues to benefit from skilled therapy services to work towards increased independence with management of pain symptoms. Continue POC.   OBJECTIVE IMPAIRMENTS: decreased activity tolerance, decreased ROM, decreased strength, impaired flexibility, improper body mechanics, postural dysfunction, and pain.   ACTIVITY LIMITATIONS: lifting, sleeping, transfers, bed mobility, reach over head, and caring for others  PARTICIPATION LIMITATIONS: meal prep, cleaning, laundry, driving, shopping, community activity, and  occupation  PERSONAL FACTORS: Age, Fitness, Past/current experiences, Sex, Time since onset of injury/illness/exacerbation, and 1-2 comorbidities:  labyrinth disorder, anxiety  are also affecting patient's functional outcome.   REHAB POTENTIAL: Fair See PMH and personal factors.  CLINICAL DECISION MAKING: Evolving/moderate complexity  EVALUATION COMPLEXITY: Moderate   GOALS: Goals reviewed with patient? Yes  SHORT TERM GOALS: Target date: 06/20/2023  Pt will be independent and compliant with initial cervical stretching, strengthening and mobilization HEP in order to maintain functional progress and improve mobility. Baseline:  To be established. Goal status: INITIAL  2.  Patient will trial dry needling to assess for benefit and pain management. Baseline: Future planned intervention. Goal status: INITIAL  3.  Patient will improve left cervical rotation by at least 5 degrees to improve driving safety and visual scanning. Baseline: 16 degrees Goal status: INITIAL  LONG TERM GOALS: Target date: 07/11/2023  Pt will be independent and compliant with advanced and finalized cervical stretching, strengthening and mobilization HEP in order to maintain functional progress and improve mobility. Baseline: To be established. Goal status: INITIAL  2.  Patient will improve left cervical rotation by at least 10 degrees from baseline to improve driving safety and visual scanning. Baseline: 16 degrees Goal status: INITIAL  3.  Patient will improve NDI score to </= 6/50 in order to demonstrate improved subjective functioning and quality of life. Baseline: 15/50 Goal status: INITIAL  4.  Patient will rate her weekly average pain rating </= 3/10 to demonstrate significantly improved pain management. Baseline: Constant 5/10 Goal status: INITIAL  PLAN:  PT FREQUENCY: 1x/week  PT DURATION: 6 weeks  PLANNED INTERVENTIONS: Therapeutic exercises, Therapeutic activity, Neuromuscular re-education,  Balance training, Gait training, Patient/Family education, Self Care, Joint mobilization, Stair training, Vestibular training, Dry Needling, Electrical stimulation, Spinal mobilization, Moist heat, Taping, Traction, Manual therapy, and Re-evaluation  PLAN FOR NEXT SESSION: assess STG, add to cervical strength, stretching, and mobilization HEP.  Pt is interested in DN.  She has done PT with heavy lifting before and it made her pain much worse-still wants to work on reaching overhead without pain.  Periscapular strength, manual therapy for pain?, how was response to TPDN?  Peter Congo, PT, DPT, CSRS 06/18/2023, 2:47 PM

## 2023-06-20 ENCOUNTER — Other Ambulatory Visit: Payer: Self-pay | Admitting: Adult Health

## 2023-06-24 ENCOUNTER — Other Ambulatory Visit: Payer: Self-pay

## 2023-06-24 ENCOUNTER — Other Ambulatory Visit: Payer: Self-pay | Admitting: Internal Medicine

## 2023-06-24 ENCOUNTER — Other Ambulatory Visit: Payer: Self-pay | Admitting: Allergy and Immunology

## 2023-06-24 ENCOUNTER — Other Ambulatory Visit: Payer: Self-pay | Admitting: Adult Health

## 2023-06-24 ENCOUNTER — Other Ambulatory Visit (HOSPITAL_COMMUNITY): Payer: Self-pay

## 2023-06-24 DIAGNOSIS — R7303 Prediabetes: Secondary | ICD-10-CM

## 2023-06-24 DIAGNOSIS — F411 Generalized anxiety disorder: Secondary | ICD-10-CM

## 2023-06-24 DIAGNOSIS — I10 Essential (primary) hypertension: Secondary | ICD-10-CM

## 2023-06-24 MED ORDER — TRELEGY ELLIPTA 200-62.5-25 MCG/ACT IN AEPB
1.0000 | INHALATION_SPRAY | Freq: Every morning | RESPIRATORY_TRACT | 3 refills | Status: DC
  Filled 2023-06-24: qty 180, 90d supply, fill #0
  Filled 2023-07-14: qty 60, 30d supply, fill #0
  Filled 2023-08-27 – 2023-09-02 (×2): qty 60, 30d supply, fill #1
  Filled 2023-09-25: qty 60, 30d supply, fill #2
  Filled 2023-10-13 – 2023-10-23 (×3): qty 60, 30d supply, fill #3
  Filled 2023-11-24: qty 60, 30d supply, fill #4
  Filled ????-??-??: fill #3

## 2023-06-24 MED ORDER — LEVOTHYROXINE SODIUM 50 MCG PO TABS
50.0000 ug | ORAL_TABLET | Freq: Every morning | ORAL | 2 refills | Status: DC
  Filled 2023-06-24: qty 90, fill #0
  Filled 2023-08-27: qty 90, 90d supply, fill #0
  Filled 2023-09-01: qty 30, 30d supply, fill #0
  Filled 2023-09-23 – 2023-09-26 (×4): qty 30, 30d supply, fill #1
  Filled 2023-10-13 – 2023-10-28 (×6): qty 30, 30d supply, fill #2
  Filled 2023-11-10 – 2023-12-01 (×5): qty 30, 30d supply, fill #3
  Filled 2023-12-16 – 2023-12-24 (×2): qty 30, 30d supply, fill #4
  Filled 2023-12-31 – 2024-01-22 (×3): qty 30, 30d supply, fill #5
  Filled 2024-02-09 – 2024-02-20 (×3): qty 30, 30d supply, fill #6
  Filled 2024-03-05 – 2024-03-19 (×4): qty 30, 30d supply, fill #7
  Filled 2024-03-31 – 2024-04-27 (×5): qty 30, 30d supply, fill #8

## 2023-06-24 MED ORDER — FAMOTIDINE 20 MG PO TABS
20.0000 mg | ORAL_TABLET | Freq: Every morning | ORAL | 3 refills | Status: DC
  Filled 2023-06-24 – 2023-08-27 (×2): qty 90, 90d supply, fill #0
  Filled 2023-09-01: qty 30, 30d supply, fill #0
  Filled 2023-09-23 – 2023-09-26 (×2): qty 30, 30d supply, fill #1
  Filled 2023-10-13 – 2023-10-28 (×6): qty 30, 30d supply, fill #2
  Filled 2023-11-10 – 2023-12-01 (×4): qty 30, 30d supply, fill #3

## 2023-06-24 MED ORDER — HYDROCHLOROTHIAZIDE 25 MG PO TABS
25.0000 mg | ORAL_TABLET | Freq: Every morning | ORAL | 3 refills | Status: DC
Start: 2023-06-24 — End: 2024-07-09
  Filled 2023-06-24 – 2023-08-27 (×2): qty 90, 90d supply, fill #0
  Filled 2023-09-01: qty 30, 30d supply, fill #0
  Filled 2023-09-23 – 2023-09-26 (×2): qty 30, 30d supply, fill #1
  Filled 2023-10-13 – 2023-10-28 (×6): qty 30, 30d supply, fill #2
  Filled 2023-11-10 – 2023-12-01 (×4): qty 30, 30d supply, fill #3
  Filled 2023-12-16 – 2023-12-24 (×2): qty 30, 30d supply, fill #4
  Filled 2023-12-31 – 2024-01-21 (×2): qty 30, 30d supply, fill #5
  Filled 2024-02-09 – 2024-02-17 (×2): qty 30, 30d supply, fill #6
  Filled 2024-03-05 – 2024-03-16 (×3): qty 30, 30d supply, fill #7
  Filled 2024-03-31 – 2024-04-22 (×5): qty 30, 30d supply, fill #8
  Filled 2024-05-07 – 2024-05-18 (×3): qty 30, 30d supply, fill #9
  Filled 2024-06-15: qty 30, 30d supply, fill #10

## 2023-06-24 MED ORDER — ROSUVASTATIN CALCIUM 10 MG PO TABS
10.0000 mg | ORAL_TABLET | ORAL | 2 refills | Status: DC
  Filled 2023-06-24: qty 90, fill #0
  Filled 2023-08-27: qty 45, 90d supply, fill #0
  Filled 2023-09-01 (×2): qty 15, 30d supply, fill #0
  Filled 2023-09-25 (×2): qty 15, 30d supply, fill #1
  Filled 2023-10-13 – 2023-10-28 (×6): qty 15, 30d supply, fill #2
  Filled 2023-11-10 – 2023-11-26 (×3): qty 15, 30d supply, fill #3
  Filled 2023-12-16 – 2023-12-24 (×3): qty 15, 30d supply, fill #4
  Filled 2023-12-31 – 2024-01-22 (×3): qty 15, 30d supply, fill #5
  Filled 2024-02-09 – 2024-02-20 (×3): qty 15, 30d supply, fill #6
  Filled 2024-03-05 – 2024-03-19 (×4): qty 15, 30d supply, fill #7
  Filled 2024-03-31 – 2024-04-19 (×2): qty 15, 30d supply, fill #8
  Filled 2024-05-07 – 2024-05-20 (×4): qty 15, 30d supply, fill #9
  Filled 2024-06-15 – 2024-06-17 (×4): qty 15, 30d supply, fill #10

## 2023-06-24 MED ORDER — ALBUTEROL SULFATE HFA 108 (90 BASE) MCG/ACT IN AERS
2.0000 | INHALATION_SPRAY | RESPIRATORY_TRACT | 1 refills | Status: DC | PRN
  Filled 2023-06-24: qty 6.7, 17d supply, fill #0

## 2023-06-24 MED ORDER — METFORMIN HCL 500 MG PO TABS
500.0000 mg | ORAL_TABLET | Freq: Two times a day (BID) | ORAL | 11 refills | Status: DC
Start: 2023-06-24 — End: 2024-07-09
  Filled 2023-06-24 – 2023-08-27 (×2): qty 180, 90d supply, fill #0
  Filled 2023-09-01: qty 60, 30d supply, fill #0
  Filled 2023-09-02: qty 180, 90d supply, fill #0
  Filled 2023-11-10: qty 180, 90d supply, fill #1
  Filled 2023-11-20 – 2023-12-01 (×3): qty 60, 30d supply, fill #1
  Filled 2023-12-16 – 2023-12-24 (×2): qty 60, 30d supply, fill #2
  Filled 2023-12-31 – 2024-01-21 (×2): qty 60, 30d supply, fill #3
  Filled 2024-02-09 – 2024-02-17 (×2): qty 60, 30d supply, fill #4
  Filled 2024-03-05 – 2024-03-16 (×3): qty 60, 30d supply, fill #5
  Filled 2024-03-31 – 2024-04-22 (×5): qty 60, 30d supply, fill #6
  Filled 2024-05-07 – 2024-05-18 (×3): qty 60, 30d supply, fill #7
  Filled 2024-06-15: qty 60, 30d supply, fill #8

## 2023-06-24 MED ORDER — FREESTYLE LIBRE 2 SENSOR MISC
11 refills | Status: DC
  Filled 2023-06-24: qty 2, fill #0

## 2023-06-24 MED ORDER — CITALOPRAM HYDROBROMIDE 10 MG PO TABS
5.0000 mg | ORAL_TABLET | Freq: Every morning | ORAL | 11 refills | Status: DC
Start: 2023-06-24 — End: 2023-08-20
  Filled 2023-06-24 – 2023-07-17 (×2): qty 45, 90d supply, fill #0

## 2023-06-24 NOTE — Telephone Encounter (Signed)
estradiol (VIVELLE-DOT) 0.05 MG/24HR patch        Possible duplicate: Hover to review recent actions on this medication   Sig: PLACE 1 PATCH TOPICALLY TWICE A WEEK *REFILL REQUEST*   Disp: 8 patch    Refills: 10   Start: 06/24/2023   Class: Normal

## 2023-06-25 ENCOUNTER — Encounter: Payer: Self-pay | Admitting: Physical Therapy

## 2023-06-25 ENCOUNTER — Other Ambulatory Visit (HOSPITAL_COMMUNITY): Payer: Self-pay

## 2023-06-25 ENCOUNTER — Other Ambulatory Visit: Payer: Self-pay

## 2023-06-25 ENCOUNTER — Ambulatory Visit: Payer: PPO | Attending: Surgery | Admitting: Physical Therapy

## 2023-06-25 VITALS — BP 126/57 | HR 59

## 2023-06-25 DIAGNOSIS — M542 Cervicalgia: Secondary | ICD-10-CM | POA: Diagnosis not present

## 2023-06-25 NOTE — Therapy (Signed)
OUTPATIENT PHYSICAL THERAPY CERVICAL TREATMENT   Patient Name: Amber Brewer MRN: [id number] DOB:Oct 02, 1951, 72 y.o., female Today's Date: 06/25/2023  END OF SESSION:  PT End of Session - 06/25/23 1019     Visit Number 4    Number of Visits 7   6 + eval   Date for PT Re-Evaluation 07/18/23   pushed out due to scheduling delay   Authorization Type [employer]    PT Start Time 1015    PT Stop Time 1053    PT Time Calculation (min) 38 min    Activity Tolerance Patient tolerated treatment well    Behavior During Therapy WFL for tasks assessed/performed            Past Medical History:  Diagnosis Date   Allergy    Anemia    age 48 only   Asthma    Bowen's disease    ~ 17 yrs ago   Chicken pox    DDD (degenerative disc disease), cervical    Depression    Diabetes mellitus without complication (HCC)    on metformin - goes back andf forth   Gallstones    GERD (gastroesophageal reflux disease)    Hx of melanoma in situ 2002   Hx: UTI (urinary tract infection)    Hyperlipidemia    Hypertension    Hypothyroidism    IBS (irritable bowel syndrome)    with D   Melanoma (HCC)    x2    Nephrolithiasis    Osteoarthritis    PVC's (premature ventricular contractions)    Status post dilation of esophageal narrowing    Past Surgical History:  Procedure Laterality Date   ABDOMINAL HYSTERECTOMY  1974   APPENDECTOMY  1977   BLADDER SURGERY  1974   BREAST EXCISIONAL BIOPSY     CATARACT EXTRACTION Bilateral 2015   CHOLECYSTECTOMY  1987   COLONOSCOPY     RECTAL SURGERY     ~17 yrs ago- Bowens dx per pt with surgery   removal of melanoma in situ     Patient Active Problem List   Diagnosis Date Noted   Myofascial pain syndrome, cervical 06/03/2018   PVC's (premature ventricular contractions) 10/31/2016   Benign hypertension 10/31/2016   Anxiety 10/31/2016   Hyperlipidemia 05/27/2016   LEG PAIN, BILATERAL 06/07/2010   LEG EDEMA, BILATERAL 10/04/2009    HYPOKALEMIA 07/20/2009   ALLERGY, HX OF 11/24/2008   Anxiety disorder 05/19/2008   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   Pain in the chest 05/19/2008   UNSPECIFIED PAROXYSMAL TACHYCARDIA 03/31/2008   HYPOTHYROIDISM, BORDERLINE 12/16/2007   PANIC ATTACK 06/04/2007   DISORDER, 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  She reports she felt immediate relief following TPDN and then in the days after her baseline "spots" returned.  These spots being areas where the bone in her head "feels sick".  She elaborates it makes her feel sleepy; no headache, throbbing or visual changes.      Hand dominance: Right  PERTINENT HISTORY:  Anxiety, labyrinth nec disorder, HTN, BLE edema, PVCs, treated for melanoma 10 years ago  PAIN:  Are you having pain? Yes: NPRS scale: 4/10 Pain location: left side of neck Pain description: intermittent, stiff/hard block Aggravating factors: turning neck Relieving factors: keeping neck in line with body  PRECAUTIONS: Fall  RED FLAGS: None     WEIGHT BEARING RESTRICTIONS: No  FALLS:  Has patient fallen in last 6 months? No  LIVING ENVIRONMENT: Lives with: lives with their spouse, lives with their son, and has a large and small dog to care for Lives in: House/apartment Stairs: Yes: Internal: 17 steps;  on right going up and External: 6 steps; on right going up Has following equipment at home:  having grab bars installed  OCCUPATION: Obtician - retired 3 weeks ago  PLOF: Independent  PATIENT GOALS: "To kind of relieve the pain"  "to give me more ROM"  NEXT MD VISIT: 06/16/2023 w/ Dr. Zoila Shutter  OBJECTIVE:   DIAGNOSTIC FINDINGS:  Cervical MRI from 12/28/2022: IMPRESSION: 1. At C4-5 there is a broad-based disc bulge. Bilateral uncovertebral degenerative changes. Severe right foraminal stenosis. Mild left foraminal stenosis. Mild spinal stenosis. 2. At C5-6 there is a broad-based disc bulge. Bilateral uncovertebral degenerative changes. Moderate spinal stenosis. Severe bilateral foraminal stenosis. 3. No acute osseous injury of the cervical spine.  PATIENT SURVEYS:  NDI 15/50 = mild perceived disability  COGNITION: Overall cognitive status: Within functional limits for tasks assessed  SENSATION: Light touch: WFL  POSTURE: rounded shoulders and forward head  PALPATION: Tender around C7 (pt has Dowager's hump), tightness in bilateral upper traps   CERVICAL ROM:   Active ROM A/PROM (deg) eval  Flexion 51  Extension 25  Right lateral flexion 39  Left lateral flexion 38  Right rotation 34  Left rotation 16   (Blank rows = not tested)  UPPER EXTREMITY ROM:  Active ROM Right eval Left eval  Shoulder flexion WNL WNL  Shoulder extension    Shoulder abduction " "  Shoulder adduction    Shoulder extension    Shoulder internal rotation    Shoulder external rotation    Elbow flexion " "  Elbow extension    Wrist flexion    Wrist extension    Wrist ulnar deviation    Wrist radial deviation    Wrist pronation    Wrist supination     (Blank rows = not tested)  UPPER EXTREMITY MMT:  MMT Right eval Left eval  Shoulder flexion 5/5 5/5  Shoulder extension    Shoulder abduction    Shoulder adduction    Shoulder extension    Shoulder internal rotation     Shoulder external rotation    Middle trapezius    Lower trapezius    Elbow flexion    Elbow extension    Wrist flexion    Wrist extension    Wrist ulnar deviation    Wrist radial deviation    Wrist pronation    Wrist supination    Grip strength     (Blank rows = not tested)  CERVICAL SPECIAL TESTS:  Neck flexor muscle endurance test: pt does not tolerate positioning for test well per report.  FUNCTIONAL TESTS:  None relevant to chief complaint.  TODAY'S TREATMENT:           06/25/2023  Today's Vitals   06/25/23 1017  BP: (!) 126/57  Pulse: (!) 59   There is no height or weight on file to calculate BMI.  TherAct Discussion of patient reported response to exercises, she states she has been sore for several days following doing HEP and was told last session to stop these for now.  PT today encourages her to resume as some of her tightness is related to weakness and poor movement pattern.  PT explained that TPDN and other modalities cannot be the primary therapy provided as there is little long-term benefit of this.  Pt verbalizes understanding.  Physically reviewed HEP to ensure low risk and proper form.  Once reviewed pt reports chin tuck is most painful per report, but only a bit after it is completed (per description sounds like delayed onset muscle soreness and general irritability of avoided movement patterns).  Instructed pt when doing HEP at home to focus on the movement pattern in non-painful ROM and not holding it to see if this improves response.  Pt has current copy of HEP. Right rotation 32 degrees, left rotation 26 degrees Seated scapular retraction w/ red theraband 2x15, pt reports positive response and feels overall postural correction down to legs w/ task  PATIENT EDUCATION:  Education details: Discussed response to TPDN, PT does not feel the "spots" are related to this modality as these are baseline per pt report and she is having no other adverse symptoms. Person  educated: Patient Education method: Chief Technology Officer Education comprehension: verbalized understanding  HOME EXERCISE PROGRAM: Access Code: [email or web address] URL: [email or web address] Date: 06/11/2023 Prepared by: Camille Bal  Exercises - Seated Upper Trapezius Stretch  - 1 x daily - 4 x weekly - 1 sets - 2 reps - 1 minute hold - Seated Assisted Cervical Rotation with Towel  - 1 x daily - 4 x weekly - 1 sets - 12 reps - Supine Chin Tuck  - 1 x daily - 5 x weekly - 2 sets - 15 reps - 2 seconds hold - Supine Cervical Retraction with Towel  - 1 x daily - 4-5 x weekly - 2 sets - 12 reps - 1 second hold - Supine Cervical Flexion Extension on Pillow  - 1 x daily - 4 x weekly - 2 sets - 15 reps - Scapular retraction with resistance  - 1 x daily - 4 x weekly - 2 sets - 10-15 reps  ASSESSMENT:  CLINICAL IMPRESSION: Reviewed HEP and added single exercise for postural stability this session.  Modified performance of HEP to improve tolerance and ability to re-establish movement pattern in painfree ROM.  Pt does well with modifications and expresses good understanding.  Her rotation ROM improved by 10 degrees to the left this visit.  She may benefit from additional session of DN and will discuss this with additional therapist at next scheduled visit.  Will continue per POC.  OBJECTIVE IMPAIRMENTS: decreased activity tolerance, decreased ROM, decreased strength, impaired flexibility, improper body mechanics, postural dysfunction, and pain.   ACTIVITY LIMITATIONS: lifting, sleeping, transfers, bed mobility, reach over head, and caring for others  PARTICIPATION LIMITATIONS: meal prep, cleaning, laundry, driving, shopping, community activity, and occupation  PERSONAL FACTORS: Age, Fitness, Past/current experiences, Sex, Time since onset of injury/illness/exacerbation, and 1-2 comorbidities: labyrinth  disorder, anxiety  are also affecting patient's functional outcome.   REHAB  POTENTIAL: Fair See PMH and personal factors.  CLINICAL DECISION MAKING: Evolving/moderate complexity  EVALUATION COMPLEXITY: Moderate   GOALS: Goals reviewed with patient? Yes  SHORT TERM GOALS: Target date: 06/20/2023  Pt will be independent and compliant with initial cervical stretching, strengthening and mobilization HEP in order to maintain functional progress and improve mobility. Baseline:  Reviewed today and encouraged to resume (9/4) Goal status: IN PROGRESS  2.  Patient will trial dry needling to assess for benefit and pain management. Baseline: Trialed with benefit (9/4) Goal status: MET  3.  Patient will improve left cervical rotation by at least 5 degrees to improve driving safety and visual scanning. Baseline: 16 degrees; 26 degrees (9/4) Goal status: MET  LONG TERM GOALS: Target date: 07/11/2023  Pt will be independent and compliant with advanced and finalized cervical stretching, strengthening and mobilization HEP in order to maintain functional progress and improve mobility. Baseline: To be established. Goal status: INITIAL  2.  Patient will improve left cervical rotation by at least 5 degrees from available ROM on 9/4 to improve driving safety and visual scanning. Baseline: 16 degrees; 26 degrees (9/4) Goal status: REVISED  3.  Patient will improve NDI score to </= 6/50 in order to demonstrate improved subjective functioning and quality of life. Baseline: 15/50 Goal status: INITIAL  4.  Patient will rate her weekly average pain rating </= 3/10 to demonstrate significantly improved pain management. Baseline: Constant 5/10 Goal status: INITIAL  PLAN:  PT FREQUENCY: 1x/week  PT DURATION: 6 weeks  PLANNED INTERVENTIONS: Therapeutic exercises, Therapeutic activity, Neuromuscular re-education, Balance training, Gait training, Patient/Family education, Self Care, Joint mobilization, Stair training, Vestibular training, Dry Needling, Electrical stimulation,  Spinal mobilization, Moist heat, Taping, Traction, Manual therapy, and Re-evaluation  PLAN FOR NEXT SESSION:  add to cervical strength, stretching, and mobilization HEP.  TPDN?  Wants to work on reaching overhead without pain.  Periscapular strength - wall slides, manual therapy for pain  Sadie Haber, PT, DPT 06/25/2023, 10:57 AM

## 2023-06-25 NOTE — Patient Instructions (Signed)
Access Code: [email or web address] URL: [email or web address] Date: 06/11/2023 Prepared by: Camille Bal  Exercises - Seated Upper Trapezius Stretch  - 1 x daily - 4 x weekly - 1 sets - 2 reps - 1 minute hold - Seated Assisted Cervical Rotation with Towel  - 1 x daily - 4 x weekly - 1 sets - 12 reps - Supine Chin Tuck  - 1 x daily - 5 x weekly - 2 sets - 15 reps - 2 seconds hold - Supine Cervical Retraction with Towel  - 1 x daily - 4-5 x weekly - 2 sets - 12 reps - 1 second hold - Supine Cervical Flexion Extension on Pillow  - 1 x daily - 4 x weekly - 2 sets - 15 reps - Scapular retraction with resistance  - 1 x daily - 4 x weekly - 2 sets - 10-15 reps

## 2023-06-28 ENCOUNTER — Other Ambulatory Visit: Payer: Self-pay

## 2023-07-02 ENCOUNTER — Ambulatory Visit: Payer: PPO | Admitting: Physical Therapy

## 2023-07-02 DIAGNOSIS — M542 Cervicalgia: Secondary | ICD-10-CM

## 2023-07-02 NOTE — Therapy (Signed)
OUTPATIENT PHYSICAL THERAPY CERVICAL TREATMENT   Patient Name: Amber Brewer MRN: [id number] DOB:December 11, 1950, 72 y.o., female Today's Date: 07/02/2023  END OF SESSION:  PT End of Session - 07/02/23 1147     Visit Number 5    Number of Visits 7   6 + eval   Date for PT Re-Evaluation 07/18/23   pushed out due to scheduling delay   Authorization Type [employer]    PT Start Time 1145    PT Stop Time 1223    PT Time Calculation (min) 38 min    Activity Tolerance Patient tolerated treatment well    Behavior During Therapy WFL for tasks assessed/performed             Past Medical History:  Diagnosis Date   Allergy    Anemia    age 63 only   Asthma    Bowen's disease    ~ 17 yrs ago   Chicken pox    DDD (degenerative disc disease), cervical    Depression    Diabetes mellitus without complication (HCC)    on metformin - goes back andf forth   Gallstones    GERD (gastroesophageal reflux disease)    Hx of melanoma in situ 2002   Hx: UTI (urinary tract infection)    Hyperlipidemia    Hypertension    Hypothyroidism    IBS (irritable bowel syndrome)    with D   Melanoma (HCC)    x2    Nephrolithiasis    Osteoarthritis    PVC's (premature ventricular contractions)    Status post dilation of esophageal narrowing    Past Surgical History:  Procedure Laterality Date   ABDOMINAL HYSTERECTOMY  1974   APPENDECTOMY  1977   BLADDER SURGERY  1974   BREAST EXCISIONAL BIOPSY     CATARACT EXTRACTION Bilateral 2015   CHOLECYSTECTOMY  1987   COLONOSCOPY     RECTAL SURGERY     ~17 yrs ago- Bowens dx per pt with surgery   removal of melanoma in situ     Patient Active Problem List   Diagnosis Date Noted   Myofascial pain syndrome, cervical 06/03/2018   PVC's (premature ventricular contractions) 10/31/2016   Benign hypertension 10/31/2016   Anxiety 10/31/2016   Hyperlipidemia 05/27/2016   LEG PAIN, BILATERAL 06/07/2010   LEG EDEMA, BILATERAL 10/04/2009    HYPOKALEMIA 07/20/2009   ALLERGY, HX OF 11/24/2008   Anxiety disorder 05/19/2008   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   Pain in the chest 05/19/2008   UNSPECIFIED PAROXYSMAL TACHYCARDIA 03/31/2008   HYPOTHYROIDISM, BORDERLINE 12/16/2007   PANIC ATTACK 06/04/2007   DISORDER, 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 Pt does still have weird sensations all over her head intermittently, no rhyme or reason to when she feels it. Feels like "the bones feel sick", feels it throughout the day.  Pt reports she is still having trouble sleeping on her R side, feels pulling on her L side of neck and shoulder. Pt reports the pillow she is using is an in-between size. Pt also reports that she sas a sleep number bed, sleeps with head of bed slightly elevated.   Hand dominance: Right  PERTINENT HISTORY:  Anxiety, labyrinth nec disorder, HTN, BLE edema, PVCs, treated for melanoma 10 years ago  PAIN:  Are you having pain? Yes: NPRS scale: 4/10 Pain location: left side of neck Pain description: intermittent, stiff/hard block Aggravating factors: turning neck Relieving factors: keeping neck in line with body  PRECAUTIONS: Fall  RED FLAGS: None     WEIGHT BEARING RESTRICTIONS: No  FALLS:  Has  patient fallen in last 6 months? No  LIVING ENVIRONMENT: Lives with: lives with their spouse, lives with their son, and has a large and small dog to care for Lives in: House/apartment Stairs: Yes: Internal: 17 steps; on right going up and External: 6 steps; on right going up Has following equipment at home:  having grab bars installed  OCCUPATION: Obtician - retired 3 weeks ago  PLOF: Independent  PATIENT GOALS: "To kind of relieve the pain"  "to give me more ROM"  NEXT MD VISIT: 06/16/2023 w/ Dr. Zoila Shutter  OBJECTIVE:   DIAGNOSTIC FINDINGS:  Cervical MRI from 12/28/2022: IMPRESSION: 1. At C4-5 there is a broad-based disc bulge. Bilateral uncovertebral degenerative changes. Severe right foraminal stenosis. Mild left foraminal stenosis. Mild spinal stenosis. 2. At C5-6 there is a broad-based disc bulge. Bilateral uncovertebral degenerative changes. Moderate spinal stenosis. Severe bilateral foraminal stenosis. 3. No acute osseous injury of the cervical spine.  PATIENT SURVEYS:  NDI 15/50 = mild perceived disability  COGNITION: Overall cognitive status: Within functional limits for tasks assessed  SENSATION: Light touch: WFL  POSTURE: rounded shoulders and forward head  PALPATION: Tender around C7 (pt has Dowager's hump), tightness in bilateral upper traps   CERVICAL ROM:   Active ROM A/PROM (deg) eval  Flexion 51  Extension 25  Right lateral flexion 39  Left lateral flexion 38  Right rotation 34  Left rotation 16   (Blank rows = not tested)  UPPER EXTREMITY ROM:  Active ROM Right eval Left eval  Shoulder flexion WNL WNL  Shoulder extension    Shoulder abduction " "  Shoulder adduction    Shoulder extension    Shoulder internal rotation    Shoulder external rotation    Elbow flexion " "  Elbow extension    Wrist flexion    Wrist extension    Wrist ulnar deviation    Wrist radial deviation    Wrist pronation    Wrist supination     (Blank rows  = not tested)  UPPER EXTREMITY MMT:  MMT Right eval Left eval  Shoulder flexion 5/5 5/5  Shoulder extension    Shoulder abduction    Shoulder adduction    Shoulder extension    Shoulder internal rotation    Shoulder external rotation    Middle trapezius    Lower trapezius    Elbow flexion    Elbow extension    Wrist flexion    Wrist extension  Wrist ulnar deviation    Wrist radial deviation    Wrist pronation    Wrist supination    Grip strength     (Blank rows = not tested)  CERVICAL SPECIAL TESTS:  Neck flexor muscle endurance test: pt does not tolerate positioning for test well per report.  FUNCTIONAL TESTS:  None relevant to chief complaint.  TODAY'S TREATMENT:            TherAct Trigger Point Dry-Needling  Treatment instructions: Expect mild to moderate muscle soreness. S/S of pneumothorax if dry needled over a lung field, and to seek immediate medical attention should they occur. Patient verbalized understanding of these instructions and education.  Patient Consent Given: Yes Education handout provided: Yes Muscles treated: L and R upper trap Treatment response/outcome: deep ache/pressure; muscle twitch detected    TherEx Anterior wall slides with foam roller x 10 reps Posterior wall angels x 10 reps, difficulty reaching UE back to wall  Supine thoracic mobilization over towel roll x 15 reps  Added appropriate exercises to HEP, see bolded below   PATIENT EDUCATION:  Education details: PT POC with plan to d/c next session Person educated: Patient Education method: Explanation and Handouts Education comprehension: verbalized understanding  HOME EXERCISE PROGRAM: Access Code: [email or web address] URL: [email or web address] Date: 06/11/2023 Prepared by: Camille Bal  Exercises - Seated Upper Trapezius Stretch  - 1 x daily - 4 x weekly - 1 sets - 2 reps - 1 minute hold - Seated Assisted Cervical Rotation with Towel  - 1 x daily - 4 x  weekly - 1 sets - 12 reps - Supine Chin Tuck  - 1 x daily - 5 x weekly - 2 sets - 15 reps - 2 seconds hold - Supine Cervical Retraction with Towel  - 1 x daily - 4-5 x weekly - 2 sets - 12 reps - 1 second hold - Supine Cervical Flexion Extension on Pillow  - 1 x daily - 4 x weekly - 2 sets - 15 reps - Scapular retraction with resistance  - 1 x daily - 4 x weekly - 2 sets - 10-15 reps - Wall Angels  - 1 x daily - 4 x weekly - 3 sets - 10 reps - Supine Thoracic Mobilization Towel Roll Vertical with Arm Stretch  - 1 x daily - 4 x weekly - 3 sets - 10 reps  ASSESSMENT:  CLINICAL IMPRESSION: Emphasis of skilled PT session on performing TPDN to upper shoulder muscles and continuing to work on postural stabilization and stretching to address ongoing pain and impaired function. Pt agreeable to have UT DN this session, wants to wait and have cervical muscles needled next session. Pt overall with good response to treatment and significant improvement in symptoms this date as compared to previous sessions. Continue POC.   OBJECTIVE IMPAIRMENTS: decreased activity tolerance, decreased ROM, decreased strength, impaired flexibility, improper body mechanics, postural dysfunction, and pain.   ACTIVITY LIMITATIONS: lifting, sleeping, transfers, bed mobility, reach over head, and caring for others  PARTICIPATION LIMITATIONS: meal prep, cleaning, laundry, driving, shopping, community activity, and occupation  PERSONAL FACTORS: Age, Fitness, Past/current experiences, Sex, Time since onset of injury/illness/exacerbation, and 1-2 comorbidities: labyrinth disorder, anxiety  are also affecting patient's functional outcome.   REHAB POTENTIAL: Fair See PMH and personal factors.  CLINICAL DECISION MAKING: Evolving/moderate complexity  EVALUATION COMPLEXITY: Moderate   GOALS: Goals reviewed with patient? Yes  SHORT TERM GOALS: Target date: 06/20/2023  Pt will be independent and compliant with initial cervical  stretching, strengthening and mobilization HEP in order to maintain functional progress and improve mobility. Baseline:  Reviewed today and encouraged to resume (9/4) Goal status: IN PROGRESS  2.  Patient will trial dry needling to assess for benefit and pain management. Baseline: Trialed with benefit (9/4) Goal status: MET  3.  Patient will improve left cervical rotation by at least 5 degrees to improve driving safety and visual scanning. Baseline: 16 degrees; 26 degrees (9/4) Goal status: MET  LONG TERM GOALS: Target date: 07/11/2023  Pt will be independent and compliant with advanced and finalized cervical stretching, strengthening and mobilization HEP in order to maintain functional progress and improve mobility. Baseline: To be established. Goal status: INITIAL  2.  Patient will improve left cervical rotation by at least 5 degrees from available ROM on 9/4 to improve driving safety and visual scanning. Baseline: 16 degrees; 26 degrees (9/4) Goal status: REVISED  3.  Patient will improve NDI score to </= 6/50 in order to demonstrate improved subjective functioning and quality of life. Baseline: 15/50 Goal status: INITIAL  4.  Patient will rate her weekly average pain rating </= 3/10 to demonstrate significantly improved pain management. Baseline: Constant 5/10 Goal status: INITIAL  PLAN:  PT FREQUENCY: 1x/week  PT DURATION: 6 weeks  PLANNED INTERVENTIONS: Therapeutic exercises, Therapeutic activity, Neuromuscular re-education, Balance training, Gait training, Patient/Family education, Self Care, Joint mobilization, Stair training, Vestibular training, Dry Needling, Electrical stimulation, Spinal mobilization, Moist heat, Taping, Traction, Manual therapy, and Re-evaluation  PLAN FOR NEXT SESSION:  assess LTG and d/c, TPDN for neck muscles  Peter Congo, PT, DPT, CSRS  07/02/2023, 12:25 PM

## 2023-07-09 ENCOUNTER — Ambulatory Visit: Payer: PPO | Admitting: Physical Therapy

## 2023-07-09 DIAGNOSIS — M542 Cervicalgia: Secondary | ICD-10-CM | POA: Diagnosis not present

## 2023-07-09 NOTE — Therapy (Signed)
OUTPATIENT PHYSICAL THERAPY CERVICAL TREATMENT - DISCHARGE NOTE   Patient Name: Amber Brewer MRN: [id number] DOB:December 06, 1950, 72 y.o., female Today's Date: 07/09/2023  PHYSICAL THERAPY DISCHARGE SUMMARY  Visits from Start of Care: 6  Current functional level related to goals / functional outcomes: Independent   Remaining deficits: Decreased B cervical rotation ROM, improved from eval   Education / Equipment: Handout for HEP   Patient agrees to discharge. Patient goals were partially met. Patient is being discharged due to being pleased with the current functional level.   END OF SESSION:  PT End of Session - 07/09/23 1143     Visit Number 6    Number of Visits 7   6 + eval   Date for PT Re-Evaluation 07/18/23   pushed out due to scheduling delay   Authorization Type [employer]    PT Start Time 1140    PT Stop Time 1215   d/c   PT Time Calculation (min) 35 min    Activity Tolerance Patient tolerated treatment well    Behavior During Therapy WFL for tasks assessed/performed              Past Medical History:  Diagnosis Date   Allergy    Anemia    age 56 only   Asthma    Bowen's disease    ~ 17 yrs ago   Chicken pox    DDD (degenerative disc disease), cervical    Depression    Diabetes mellitus without complication (HCC)    on metformin - goes back andf forth   Gallstones    GERD (gastroesophageal reflux disease)    Hx of melanoma in situ 2002   Hx: UTI (urinary tract infection)    Hyperlipidemia    Hypertension    Hypothyroidism    IBS (irritable bowel syndrome)    with D   Melanoma (HCC)    x2    Nephrolithiasis    Osteoarthritis    PVC's (premature ventricular contractions)    Status post dilation of esophageal narrowing    Past Surgical History:  Procedure Laterality Date   ABDOMINAL HYSTERECTOMY  1974   APPENDECTOMY  1977   BLADDER SURGERY  1974   BREAST EXCISIONAL BIOPSY     CATARACT EXTRACTION Bilateral 2015    CHOLECYSTECTOMY  1987   COLONOSCOPY     RECTAL SURGERY     ~17 yrs ago- Bowens dx per pt with surgery   removal of melanoma in situ     Patient Active Problem List   Diagnosis Date Noted   Myofascial pain syndrome, cervical 06/03/2018   PVC's (premature ventricular contractions) 10/31/2016   Benign hypertension 10/31/2016   Anxiety 10/31/2016   Hyperlipidemia 05/27/2016   LEG PAIN, BILATERAL 06/07/2010   LEG EDEMA, BILATERAL 10/04/2009   HYPOKALEMIA 07/20/2009   ALLERGY, HX OF 11/24/2008   Anxiety disorder 05/19/2008   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   Pain in the chest 05/19/2008   UNSPECIFIED PAROXYSMAL TACHYCARDIA 03/31/2008   HYPOTHYROIDISM, BORDERLINE 12/16/2007   PANIC ATTACK 06/04/2007   DISORDER, 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  Pt reports pain is much improved from when she first went to her doctor.  Right now pain: 4/10, average pain over the past week: 6/10 due to sleeping and being busy.   Hand dominance: Right  PERTINENT HISTORY:  Anxiety, labyrinth nec disorder, HTN, BLE edema, PVCs, treated for melanoma 10 years ago  PAIN:  Are you having pain? Yes: NPRS scale: 4/10 Pain location: left  side of neck Pain description: intermittent, stiff/hard block Aggravating factors: turning neck Relieving factors: keeping neck in line with body  PRECAUTIONS: Fall  RED FLAGS: None     WEIGHT BEARING RESTRICTIONS: No  FALLS:  Has patient fallen in last 6 months? No  LIVING ENVIRONMENT: Lives with: lives with their spouse, lives with their son, and has a large and small dog to care for Lives in: House/apartment Stairs: Yes: Internal: 17 steps; on right going up and External: 6 steps; on right going up Has following equipment at home:  having grab bars installed  OCCUPATION: Obtician - retired 3 weeks ago  PLOF: Independent  PATIENT GOALS: "To kind of relieve the pain"  "to give me more ROM"  NEXT MD VISIT: 06/16/2023 w/ Dr. Zoila Shutter  OBJECTIVE:   DIAGNOSTIC FINDINGS:  Cervical MRI from 12/28/2022: IMPRESSION: 1. At C4-5 there is a broad-based disc bulge. Bilateral uncovertebral degenerative changes. Severe right foraminal stenosis. Mild left foraminal stenosis. Mild spinal stenosis. 2. At C5-6 there is a broad-based disc bulge. Bilateral uncovertebral degenerative changes. Moderate spinal stenosis. Severe bilateral foraminal stenosis. 3. No acute osseous injury of the cervical spine.  PATIENT SURVEYS:  NDI 15/50 = mild perceived disability  COGNITION: Overall cognitive status: Within functional limits for tasks assessed  SENSATION: Light touch: WFL  POSTURE: rounded shoulders and forward head  PALPATION: Tender around C7 (pt has Dowager's hump), tightness in bilateral upper traps   CERVICAL ROM:   Active ROM A/PROM (deg) eval  Flexion 51  Extension 25  Right lateral flexion 39  Left lateral flexion 38  Right rotation 34  Left rotation 16   (Blank rows = not tested)  UPPER EXTREMITY ROM:  Active ROM Right eval Left eval  Shoulder flexion WNL WNL  Shoulder extension    Shoulder abduction " "  Shoulder adduction    Shoulder extension     Shoulder internal rotation    Shoulder external rotation    Elbow flexion " "  Elbow extension    Wrist flexion    Wrist extension    Wrist ulnar deviation    Wrist radial deviation    Wrist pronation    Wrist supination     (Blank rows = not tested)  UPPER EXTREMITY MMT:  MMT Right eval Left eval  Shoulder flexion 5/5 5/5  Shoulder extension    Shoulder abduction    Shoulder adduction    Shoulder extension    Shoulder internal rotation    Shoulder external rotation    Middle trapezius    Lower trapezius    Elbow flexion    Elbow extension    Wrist flexion    Wrist extension    Wrist ulnar deviation    Wrist radial deviation    Wrist pronation    Wrist supination    Grip strength     (Blank rows = not tested)  CERVICAL SPECIAL  TESTS:  Neck flexor muscle endurance test: pt does not tolerate positioning for test well per report.  FUNCTIONAL TESTS:  None relevant to chief complaint.  TODAY'S TREATMENT:            TherAct Trigger Point Dry-Needling  Treatment instructions: Expect mild to moderate muscle soreness. S/S of pneumothorax if dry needled over a lung field, and to seek immediate medical attention should they occur. Patient verbalized understanding of these instructions and education.  Patient Consent Given: Yes Education handout provided: Yes Muscles treated: L and R suboccipitals, L and R splenius Treatment response/outcome: deep ache/pressure; muscle twitch detected    Cervical AROM reassessed: L: 36* "ouch" R: 47* "strong tightness"   PATIENT EDUCATION:  Education details: results of OM and cervical ROM; continue with HEP Person educated: Patient Education method: Explanation Education comprehension: verbalized understanding  HOME EXERCISE PROGRAM: Access Code: [email or web address] URL: [email or web address] Date: 06/11/2023 Prepared by: Camille Bal  Exercises - Seated Upper Trapezius Stretch  - 1 x daily - 4 x weekly - 1 sets  - 2 reps - 1 minute hold - Seated Assisted Cervical Rotation with Towel  - 1 x daily - 4 x weekly - 1 sets - 12 reps - Supine Chin Tuck  - 1 x daily - 5 x weekly - 2 sets - 15 reps - 2 seconds hold - Supine Cervical Retraction with Towel  - 1 x daily - 4-5 x weekly - 2 sets - 12 reps - 1 second hold - Supine Cervical Flexion Extension on Pillow  - 1 x daily - 4 x weekly - 2 sets - 15 reps - Scapular retraction with resistance  - 1 x daily - 4 x weekly - 2 sets - 10-15 reps - Wall Angels  - 1 x daily - 4 x weekly - 3 sets - 10 reps - Supine Thoracic Mobilization Towel Roll Vertical with Arm Stretch  - 1 x daily - 4 x weekly - 3 sets - 10 reps  ASSESSMENT:  CLINICAL IMPRESSION: Emphasis of skilled PT session on assessing LTG and performing TPDN in preparation for d/c from OPPT services this date. Pt has met 2/4 LTG due to being independent with her final HEP and improving her L cervical rotation from 16 degrees initially to 36 degrees this date. Pt did exhibit increased disability level based on her score on the NDI, but is also more aware of her symptoms since starting PT. Pt also does continue to have pain but mostly with sleeping, gave her suggestions to change her sleeping position in order to decrease pain. Pt pleased with her progress and agreeable to d/c this date.   OBJECTIVE IMPAIRMENTS: decreased activity tolerance, decreased ROM, decreased strength, impaired flexibility, improper body mechanics, postural dysfunction, and pain.   ACTIVITY LIMITATIONS: lifting, sleeping, transfers, bed mobility, reach over head, and caring for others  PARTICIPATION LIMITATIONS: meal prep, cleaning, laundry, driving, shopping, community activity, and occupation  PERSONAL FACTORS: Age, Fitness, Past/current experiences, Sex, Time since onset of injury/illness/exacerbation, and 1-2 comorbidities: labyrinth disorder, anxiety  are also affecting patient's functional outcome.   REHAB POTENTIAL: Fair See PMH  and personal factors.  CLINICAL DECISION MAKING: Evolving/moderate complexity  EVALUATION COMPLEXITY: Moderate   GOALS: Goals reviewed with patient? Yes  SHORT TERM GOALS: Target date: 06/20/2023  Pt will be independent and compliant with initial cervical stretching, strengthening and mobilization HEP in order to maintain functional progress and improve mobility. Baseline:  Reviewed today and encouraged to resume (9/4) Goal status:  IN PROGRESS  2.  Patient will trial dry needling to assess for benefit and pain management. Baseline: Trialed with benefit (9/4) Goal status: MET  3.  Patient will improve left cervical rotation by at least 5 degrees to improve driving safety and visual scanning. Baseline: 16 degrees; 26 degrees (9/4) Goal status: MET  LONG TERM GOALS: Target date: 07/11/2023  Pt will be independent and compliant with advanced and finalized cervical stretching, strengthening and mobilization HEP in order to maintain functional progress and improve mobility. Baseline: To be established. Goal status: MET  2.  Patient will improve left cervical rotation by at least 5 degrees from available ROM on 9/4 to improve driving safety and visual scanning. Baseline: 16 degrees; 26 degrees (9/4), 36 degrees (9/18) Goal status: MET  3.  Patient will improve NDI score to </= 6/50 in order to demonstrate improved subjective functioning and quality of life. Baseline: 15/50, 18/50 (9/18) Goal status: NOT MET  4.  Patient will rate her weekly average pain rating </= 3/10 to demonstrate significantly improved pain management. Baseline: Constant 5/10, average 6/10 (9/18) Goal status: NOT MET    Peter Congo, PT, DPT, CSRS  07/09/2023, 12:21 PM

## 2023-07-14 ENCOUNTER — Other Ambulatory Visit (HOSPITAL_COMMUNITY): Payer: Self-pay

## 2023-07-15 ENCOUNTER — Other Ambulatory Visit (HOSPITAL_COMMUNITY): Payer: Self-pay

## 2023-07-17 ENCOUNTER — Other Ambulatory Visit: Payer: Self-pay

## 2023-07-31 ENCOUNTER — Ambulatory Visit (HOSPITAL_COMMUNITY)
Admission: EM | Admit: 2023-07-31 | Discharge: 2023-07-31 | Disposition: A | Discharge status: Home / Self Care | Payer: PPO | Attending: Internal Medicine | Admitting: Internal Medicine

## 2023-07-31 ENCOUNTER — Encounter (HOSPITAL_COMMUNITY): Payer: Self-pay

## 2023-07-31 DIAGNOSIS — N3001 Acute cystitis with hematuria: Secondary | ICD-10-CM | POA: Insufficient documentation

## 2023-07-31 LAB — POCT URINALYSIS DIP (MANUAL ENTRY)
Bilirubin, UA: NEGATIVE
Glucose, UA: 100 mg/dL — AB
Ketones, POC UA: NEGATIVE mg/dL
Nitrite, UA: POSITIVE — AB
Protein Ur, POC: 30 mg/dL — AB
Spec Grav, UA: 1.01 (ref 1.010–1.025)
Urobilinogen, UA: 1 U/dL
pH, UA: 5 (ref 5.0–8.0)

## 2023-07-31 MED ORDER — AMOXICILLIN-POT CLAVULANATE 500-125 MG PO TABS: 1.0000 | ORAL_TABLET | Freq: Two times a day (BID) | ORAL | 0 refills | Status: AC

## 2023-07-31 NOTE — ED Triage Notes (Signed)
Patient here today with c/o urinary frequency, LB pain, and lower abd pressure X 2 days. She has been taking AZO with some relief.

## 2023-07-31 NOTE — ED Provider Notes (Signed)
[facility]    CSN: [id number] Arrival date & time: 07/31/23  1505      History   Chief Complaint Chief Complaint  Patient presents with   Urinary Frequency    HPI Amber Brewer is a 72 y.o. female.   Patient presents with concerns of possible UTI. She reports urinary frequency, pain with urination, hematuria, lower abdominal pressure, and some diffuse low back pain for the past 2 days. She reports similar symptoms with prior UTIs, last was a year or more ago she thinks. She denies fever, nausea/vomiting, or discharge. She has taken AZO with some mild improvement.   The history is provided by the patient.  Urinary Frequency Associated symptoms include abdominal pain.    Past Medical History:  Diagnosis Date   Allergy    Anemia    age 88 only   Asthma    Bowen's disease    ~ 17 yrs ago   Chicken pox    DDD (degenerative disc disease), cervical    Depression    Diabetes mellitus without complication (HCC)    on metformin - goes back andf forth   Gallstones    GERD (gastroesophageal reflux disease)    Hx of melanoma in situ 2002   Hx: UTI (urinary tract infection)    Hyperlipidemia    Hypertension    Hypothyroidism    IBS (irritable bowel syndrome)    with D   Melanoma (HCC)    x2    Nephrolithiasis    Osteoarthritis    PVC's (premature ventricular contractions)    Status post dilation of esophageal narrowing     Patient Active Problem List   Diagnosis Date Noted   Myofascial pain syndrome, cervical 06/03/2018   PVC's (premature ventricular contractions) 10/31/2016   Benign hypertension 10/31/2016   Anxiety 10/31/2016   Hyperlipidemia 05/27/2016   LEG PAIN, BILATERAL 06/07/2010   LEG EDEMA, BILATERAL 10/04/2009   HYPOKALEMIA 07/20/2009   ALLERGY, HX OF 11/24/2008   Anxiety disorder 05/19/2008   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   Pain in the chest 05/19/2008   UNSPECIFIED PAROXYSMAL TACHYCARDIA 03/31/2008   HYPOTHYROIDISM, BORDERLINE  12/16/2007   PANIC ATTACK 06/04/2007   DISORDER, 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      Home Medications    Prior to Admission medications   Medication Sig Start Date End Date Taking? Authorizing Provider  amoxicillin-clavulanate (AUGMENTIN) 500-125 MG tablet Take 1 tablet by mouth 2 (two) times daily for 7 days. 07/31/23 08/07/23 Yes Whitt Auletta L, PA  albuterol (VENTOLIN HFA) 108 (90 Base) MCG/ACT inhaler Inhale 2 puffs into the lungs every 4 (four) hours as needed for wheezing or shortness of breath. 06/24/23   Kozlow, Alvira Philips, MD  amLODipine (NORVASC) 5 MG tablet Take 1 tablet (5 mg total) by mouth daily. 06/18/23   Chrystie Nose, MD  blood glucose meter  kit and supplies Dispense based on patient and insurance preference. Use up to four times daily as directed. (FOR ICD-10 E10.9, E11.9). 05/01/22   Nafziger, Kandee Keen, NP  budesonide (RHINOCORT AQUA) 32 MCG/ACT nasal spray Place 1 spray into both nostrils daily as needed for allergies.     [provider]  calcium carbonate (OSCAL) 1500 (600 Ca) MG TABS tablet Take by mouth 2 (two) times daily with a meal.    [provider]  carvedilol (COREG) 25 MG tablet TAKE 1 TABLET BY MOUTH TWICE DAILY *REFILL REQUEST* 06/13/23   Hilty, Lisette Abu, MD  Cholecalciferol (VITAMIN D) 50 MCG (2000 UT) tablet Take 2,000 Units by mouth daily.     [provider]  citalopram (CELEXA) 10 MG tablet Take 0.5 tablets (5 mg total) by mouth  every morning. 06/24/23   Nafziger, Kandee Keen, NP  Continuous Glucose Sensor (FREESTYLE LIBRE 2 SENSOR) MISC USE TO check blood glucose AND CHANGE sensor every 14 DAYS 06/24/23   Nafziger, Kandee Keen, NP  cycloSPORINE (RESTASIS) 0.05 % ophthalmic emulsion Place 1 drop into both eyes 2 (two) times daily.    [provider]  estradiol (VIVELLE-DOT) 0.05 MG/24HR patch PLACE 1 PATCH TOPICALLY TWICE A WEEK *REFILL REQUEST* 06/18/23   Nafziger, Kandee Keen, NP  famotidine (PEPCID) 20 MG tablet Take 1 tablet (20 mg total) by mouth in the morning. 06/24/23   Kozlow, Alvira Philips, MD  Fluticasone-Umeclidin-Vilant (TRELEGY ELLIPTA) 200-62.5-25 MCG/ACT AEPB Inhale 1 puff into the lungs in the morning. 06/24/23   Kozlow, Alvira Philips, MD  hydrochlorothiazide (HYDRODIURIL) 25 MG tablet Take 1 tablet (25 mg total) by mouth every morning. 06/24/23   Nafziger, Kandee Keen, NP  irbesartan (AVAPRO) 150 MG tablet Take 1 tablet (150 mg total) by mouth daily. 06/13/23   Hilty, Lisette Abu, MD  Lancets (ONETOUCH DELICA PLUS LANCET33G) MISC USE TO check blood glucose UP TO four times daily AS DIRECTED 05/28/22   Nafziger, Kandee Keen, NP  levothyroxine (SYNTHROID) 50 MCG tablet Take 1 tablet (50 mcg total) by mouth every morning. 06/24/23   Nafziger, Kandee Keen, NP  loratadine (CLARITIN) 5 MG chewable tablet Chew 5 mg by mouth daily.    [provider]  metFORMIN (GLUCOPHAGE) 500 MG tablet Take 1 tablet (500 mg total) by mouth 2 (two) times daily with a meal. 06/24/23   Nafziger, Kandee Keen, NP  minoxidil (ROGAINE) 2 % external solution Apply topically daily.    [provider]  multivitamin-lutein (OCUVITE-LUTEIN) CAPS capsule Take 1 capsule by mouth daily.    [provider]  [facility] ULTRA test strip USE TO check blood glucose UP TO four times daily AS DIRECTED 05/28/22   Nafziger, Kandee Keen, NP  potassium chloride (KLOR-CON) 10 MEQ tablet TAKE ONE TABLET BY MOUTH EVERY EVENING 05/27/23   Nafziger, Kandee Keen, NP  rosuvastatin (CRESTOR) 10 MG tablet Take 1 tablet (10 mg  total) by mouth every other evening. 06/24/23   Nafziger, Kandee Keen, NP  TURMERIC PO Take 500 mg by mouth daily.     [provider]    Family History Family History  Problem Relation Age of Onset   Hypertension Mother    Thyroid disease Mother    Arthritis Mother    Depression Mother    Hearing loss Mother    Miscarriages / [place] Mother    Heart attack Father    Heart disease Father    Alcohol abuse Father    Arthritis Father    Depression Father    Drug abuse Father  Hypertension Father    Alcohol abuse Sister    Arthritis Sister    Depression Sister    Hearing loss Sister    Hypotension Sister    Depression Sister    Heart disease Sister    Hyperlipidemia Sister    Hypertension Maternal Aunt    Hypertension Maternal Uncle    Clotting disorder Maternal Uncle    Prostate cancer Maternal Uncle    Colon cancer Maternal Uncle    Colon polyps Maternal Uncle    Diabetes Maternal Uncle        all uncles   Arthritis Maternal Grandmother    Asthma Maternal Grandmother    Breast cancer Maternal Grandmother    Stomach cancer Maternal Grandmother    Hearing loss Maternal Grandmother    Diabetes Maternal Grandfather    Heart disease Maternal Grandfather    Arthritis Maternal Grandfather    Hearing loss Maternal Grandfather    Hypertension Maternal Grandfather    Kidney disease Maternal Grandfather    Hyperlipidemia Paternal Grandmother        also MI   Breast cancer Paternal Grandmother    Arthritis Paternal Grandmother    Heart attack Paternal Grandmother    Hyperlipidemia Paternal Grandfather        also MI   Heart disease Paternal Grandfather    Heart attack Paternal Grandfather    Endometriosis Daughter    Hypertension Son    Esophageal cancer Neg Hx    Rectal cancer Neg Hx     Social History Social History   Tobacco Use   Smoking status: Never   Smokeless tobacco: Never  Vaping Use   Vaping status: Never Used  Substance Use Topics   Alcohol  use: No   Drug use: No     Allergies   Codeine phosphate, Prednisone, Betadine [povidone iodine], Sulfa antibiotics, and Iodine   Review of Systems Review of Systems  Constitutional:  Negative for fever.  Gastrointestinal:  Positive for abdominal pain. Negative for nausea and vomiting.  Genitourinary:  Positive for dysuria, frequency and hematuria. Negative for flank pain, urgency and vaginal discharge.  Musculoskeletal:  Positive for back pain.     Physical Exam Triage Vital Signs ED Triage Vitals [07/31/23 1609]  Encounter Vitals Group     BP 134/85     Systolic BP Percentile      Diastolic BP Percentile      Pulse Rate 60     Resp 16     Temp 98.3 F (36.8 C)     Temp Source Oral     SpO2 96 %     Weight 193 lb (87.5 kg)     Height 5' 3.5" (1.613 m)     Head Circumference      Peak Flow      Pain Score 4     Pain Loc      Pain Education      Exclude from Growth Chart    No data found.  Updated Vital Signs BP 134/85 (BP Location: Left Arm)   Pulse 60   Temp 98.3 F (36.8 C) (Oral)   Resp 16   Ht 5' 3.5" (1.613 m)   Wt 193 lb (87.5 kg)   SpO2 96%   BMI 33.65 kg/m   Visual Acuity Right Eye Distance:   Left Eye Distance:   Bilateral Distance:    Right Eye Near:   Left Eye Near:    Bilateral Near:     Physical Exam  Vitals and nursing note reviewed.  Constitutional:      General: She is not in acute distress. Cardiovascular:     Rate and Rhythm: Normal rate and regular rhythm.     Heart sounds: Normal heart sounds.  Pulmonary:     Effort: Pulmonary effort is normal.     Breath sounds: Normal breath sounds.  Abdominal:     Palpations: Abdomen is soft.     Tenderness: There is no abdominal tenderness. There is no right CVA tenderness, left CVA tenderness or guarding.  Skin:    Findings: No rash.  Neurological:     Mental Status: She is alert.      UC Treatments / Results  Labs (all labs ordered are listed, but only abnormal results are  displayed) Labs Reviewed  POCT URINALYSIS DIP (MANUAL ENTRY) - Abnormal; Notable for the following components:      Result Value   Color, UA red (*)    Clarity, UA cloudy (*)    Glucose, UA =100 (*)    Blood, UA large (*)    Protein Ur, POC =30 (*)    Nitrite, UA Positive (*)    Leukocytes, UA Trace (*)    All other components within normal limits  URINE CULTURE    EKG   Radiology No results found.  Procedures Procedures (including critical care time)  Medications Ordered in UC Medications - No data to display  Initial Impression / Assessment and Plan / UC Course  I have reviewed the triage vital signs and the nursing notes.  Pertinent labs & imaging results that were available during my care of the patient were reviewed by me and considered in my medical decision making (see chart for details).     S/s consistent with UTI, no evidence of pyelo or other complications. Empiric tx with Cx sent out. Discussed return/ER precautions with pt.   E/M: 1 acute uncomplicated illness, 2 data, moderate risk due to prescription management  Final Clinical Impressions(s) / UC Diagnoses   Final diagnoses:  Acute cystitis with hematuria     Discharge Instructions      Take antibiotics as prescribed. We are sending urine to the lab for culture and will contact you with results. Keep hydrated.  Go to the ER if develop fever, worsening back pain, or nausea/vomiting.     ED Prescriptions     Medication Sig Dispense Auth. Provider   amoxicillin-clavulanate (AUGMENTIN) 500-125 MG tablet Take 1 tablet by mouth 2 (two) times daily for 7 days. 14 tablet Vallery Sa, Lily Kernen L, [place]      PDMP not reviewed this encounter.   Estanislado Pandy, [place] 07/31/23 1710

## 2023-07-31 NOTE — Discharge Instructions (Signed)
Take antibiotics as prescribed. We are sending urine to the lab for culture and will contact you with results. Keep hydrated.  Go to the ER if develop fever, worsening back pain, or nausea/vomiting.

## 2023-08-02 LAB — URINE CULTURE: Culture: >=100000 — AB

## 2023-08-11 ENCOUNTER — Other Ambulatory Visit: Payer: Self-pay

## 2023-08-11 ENCOUNTER — Other Ambulatory Visit (HOSPITAL_COMMUNITY): Payer: Self-pay

## 2023-08-12 ENCOUNTER — Other Ambulatory Visit (HOSPITAL_COMMUNITY): Payer: Self-pay

## 2023-08-20 ENCOUNTER — Other Ambulatory Visit (HOSPITAL_COMMUNITY): Payer: Self-pay

## 2023-08-20 ENCOUNTER — Encounter: Payer: Self-pay | Admitting: Adult Health

## 2023-08-20 ENCOUNTER — Ambulatory Visit: Payer: PPO | Admitting: Adult Health

## 2023-08-20 ENCOUNTER — Other Ambulatory Visit: Payer: Self-pay | Admitting: Adult Health

## 2023-08-20 VITALS — BP 110/60 | HR 65 | Temp 98.0°F | Ht 63.5 in | Wt 192.0 lb

## 2023-08-20 DIAGNOSIS — Z7984 Long term (current) use of oral hypoglycemic drugs: Secondary | ICD-10-CM

## 2023-08-20 DIAGNOSIS — F411 Generalized anxiety disorder: Secondary | ICD-10-CM | POA: Diagnosis not present

## 2023-08-20 DIAGNOSIS — I10 Essential (primary) hypertension: Secondary | ICD-10-CM

## 2023-08-20 DIAGNOSIS — E119 Type 2 diabetes mellitus without complications: Secondary | ICD-10-CM

## 2023-08-20 DIAGNOSIS — Z23 Encounter for immunization: Secondary | ICD-10-CM

## 2023-08-20 LAB — POCT GLYCOSYLATED HEMOGLOBIN (HGB A1C): Hemoglobin A1C: 5.9 % — AB (ref 4.0–5.6)

## 2023-08-20 MED ORDER — CITALOPRAM HYDROBROMIDE 10 MG PO TABS
10.0000 mg | ORAL_TABLET | Freq: Every morning | ORAL | 1 refills | Status: DC
  Filled 2023-08-20 – 2023-08-27 (×2): qty 90, 90d supply, fill #0
  Filled 2023-09-01: qty 30, 30d supply, fill #0
  Filled 2023-09-23 – 2023-09-26 (×2): qty 30, 30d supply, fill #1
  Filled 2023-10-13 – 2023-10-28 (×6): qty 30, 30d supply, fill #2
  Filled 2023-11-10 – 2023-12-01 (×4): qty 30, 30d supply, fill #3
  Filled 2023-12-16 – 2023-12-24 (×2): qty 30, 30d supply, fill #4
  Filled 2023-12-31 – 2024-01-21 (×2): qty 30, 30d supply, fill #5

## 2023-08-20 MED ORDER — FREESTYLE LIBRE 3 READER DEVI
0 refills | Status: DC
Start: 2023-08-20 — End: 2024-05-20
  Filled 2023-08-20: qty 1, fill #0
  Filled 2023-09-03 – 2023-09-04 (×2): qty 1, 30d supply, fill #0

## 2023-08-20 MED ORDER — FREESTYLE LIBRE 3 SENSOR MISC
3 refills | Status: DC
Start: 2023-08-20 — End: 2023-08-20
  Filled 2023-08-20: qty 6, fill #0

## 2023-08-20 MED ORDER — FREESTYLE LIBRE 3 PLUS SENSOR MISC
3 refills | Status: DC
  Filled 2023-08-20: qty 6, 84d supply, fill #0
  Filled 2023-11-18: qty 6, 84d supply, fill #1
  Filled 2024-02-13: qty 6, 84d supply, fill #2
  Filled 2024-04-22 (×2): qty 6, 90d supply, fill #3
  Filled 2024-04-22 (×2): qty 6, 84d supply, fill #3

## 2023-08-20 NOTE — Progress Notes (Signed)
Subjective:    Patient ID: Amber Brewer, female    DOB: 08-Apr-1951, 72 y.o.   MRN: [id number]  HPI 72 year old female who  has a past medical history of Allergy, Anemia, Asthma, Bowen's disease, Chicken pox, DDD (degenerative disc disease), cervical, Depression, Diabetes mellitus without complication (HCC), Gallstones, GERD (gastroesophageal reflux disease), melanoma in situ (2002), UTI (urinary tract infection), Hyperlipidemia, Hypertension, Hypothyroidism, IBS (irritable bowel syndrome), Melanoma (HCC), Nephrolithiasis, Osteoarthritis, PVC's (premature ventricular contractions), and Status post dilation of esophageal narrowing.  DM Type 2 -formally diagnosed in July 2023 at her CPE when her A1c was 6.9.  At this time she was on metformin 250 daily for prediabetes.  This was changed to 500 mg twice daily.  She has been tolerating this medication well and has been working on lifestyle modifications.  She continues to use the libre CGM. She did not bring her reader today.. She denies episodes of hypoglycemia.  Lab Results  Component Value Date   HGBA1C 5.9 (A) 08/20/2023   HGBA1C 6.3 05/20/2023   HGBA1C 5.8 (A) 12/10/2022   Wt Readings from Last 5 Encounters:  08/20/23 192 lb (87.1 kg)  07/31/23 193 lb (87.5 kg)  06/16/23 197 lb 12.8 oz (89.7 kg)  05/20/23 197 lb (89.4 kg)  05/01/23 194 lb (88 kg)   Hypertension-managed with Norvasc 5 mg daily, Coreg 25 mg twice daily, HCTZ 25 mg daily, and Avapro 150 mg daily.  He does take a potassium supplement of 10 meq .  She checks her blood pressures at home with readings mostly in the 120s over 70s BP Readings from Last 3 Encounters:  08/20/23 110/60  07/31/23 134/85  06/25/23 (!) 126/57   Anxiety - She is currently managed with Celexa 5 mg. She would like to increase to 10 mg. She has been feeling more anxious about the election and depressed due to losing her job.   Review of Systems See HPI   Past Medical History:  Diagnosis Date    Allergy    Anemia    age 19 only   Asthma    Bowen's disease    ~ 17 yrs ago   Chicken pox    DDD (degenerative disc disease), cervical    Depression    Diabetes mellitus without complication (HCC)    on metformin - goes back andf forth   Gallstones    GERD (gastroesophageal reflux disease)    Hx of melanoma in situ 2002   Hx: UTI (urinary tract infection)    Hyperlipidemia    Hypertension    Hypothyroidism    IBS (irritable bowel syndrome)    with D   Melanoma (HCC)    x2    Nephrolithiasis    Osteoarthritis    PVC's (premature ventricular contractions)    Status post dilation of esophageal narrowing     Social History   Socioeconomic History   Marital status: Married    Spouse name: Not on file   Number of children: 2   Years of education: 13   Highest education level: Not on file  Occupational History   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  Tobacco Use   Smoking status: Never   Smokeless tobacco: Never  Vaping Use   Vaping status: Never Used  Substance and Sexual Activity   Alcohol use: No   Drug use: No   Sexual activity: Yes    Birth control/protection: Post-menopausal, Surgical  Other  Topics Concern   Not on file  Social History Narrative   epworth sleepiness scale = 8 (01/18/2016)   Lives at home with her husband and disabled son   Right handed   Caffeine: decaf   Social Determinants of Health   Financial Resource Strain: Medium Risk (05/01/2023)   Overall Financial Resource Strain (CARDIA)    Difficulty of Paying Living Expenses: Somewhat hard  Food Insecurity: Food Insecurity Present (05/01/2023)   Hunger Vital Sign    Worried About Running Out of Food in the Last Year: Sometimes true    Ran Out of Food in the Last Year: Never true  Transportation Needs: No Transportation Needs (05/01/2023)   PRAPARE - Administrator, Civil Service (Medical): No    Lack of Transportation  (Non-Medical): No  Physical Activity: Insufficiently Active (05/01/2023)   Exercise Vital Sign    Days of Exercise per Week: 4 days    Minutes of Exercise per Session: 30 min  Stress: Stress Concern Present (05/01/2023)   [employer] of Occupational Health - Occupational Stress Questionnaire    Feeling of Stress : To some extent  Social Connections: Moderately Integrated (05/01/2023)   Social Connection and Isolation Panel [NHANES]    Frequency of Communication with Friends and Family: More than three times a week    Frequency of Social Gatherings with Friends and Family: Once a week    Attends Religious Services: More than 4 times per year    Active Member of [employer] or Organizations: No    Attends Banker Meetings: Never    Marital Status: Married  Catering manager Violence: Not At Risk (05/01/2023)   Humiliation, Afraid, Rape, and Kick questionnaire    Fear of Current or Ex-Partner: No    Emotionally Abused: No    Physically Abused: No    Sexually Abused: No    Past Surgical History:  Procedure Laterality Date   ABDOMINAL HYSTERECTOMY  1974   APPENDECTOMY  1977   BLADDER SURGERY  1974   BREAST EXCISIONAL BIOPSY     CATARACT EXTRACTION Bilateral 2015   CHOLECYSTECTOMY  1987   COLONOSCOPY     RECTAL SURGERY     ~17 yrs ago- Bowens dx per pt with surgery   removal of melanoma in situ      Family History  Problem Relation Age of Onset   Hypertension Mother    Thyroid disease Mother    Arthritis Mother    Depression Mother    Hearing loss Mother    Miscarriages / Stillbirths Mother    Heart attack Father    Heart disease Father    Alcohol abuse Father    Arthritis Father    Depression Father    Drug abuse Father    Hypertension Father    Alcohol abuse Sister    Arthritis Sister    Depression Sister    Hearing loss Sister    Hypotension Sister    Depression Sister    Heart disease Sister    Hyperlipidemia Sister    Hypertension Maternal Aunt     Hypertension Maternal Uncle    Clotting disorder Maternal Uncle    Prostate cancer Maternal Uncle    Colon cancer Maternal Uncle    Colon polyps Maternal Uncle    Diabetes Maternal Uncle        all uncles   Arthritis Maternal Grandmother    Asthma Maternal Grandmother    Breast cancer Maternal Grandmother    Stomach  cancer Maternal Grandmother    Hearing loss Maternal Grandmother    Diabetes Maternal Grandfather    Heart disease Maternal Grandfather    Arthritis Maternal Grandfather    Hearing loss Maternal Grandfather    Hypertension Maternal Grandfather    Kidney disease Maternal Grandfather    Hyperlipidemia Paternal Grandmother        also MI   Breast cancer Paternal Grandmother    Arthritis Paternal Grandmother    Heart attack Paternal Grandmother    Hyperlipidemia Paternal Grandfather        also MI   Heart disease Paternal Grandfather    Heart attack Paternal Grandfather    Endometriosis Daughter    Hypertension Son    Esophageal cancer Neg Hx    Rectal cancer Neg Hx     Allergies  Allergen Reactions   Codeine Phosphate Nausea And Vomiting    REACTION: unspecified   Prednisone Shortness Of Breath   Betadine [Povidone Iodine] Hives    After surgical skin prep   Sulfa Antibiotics Nausea And Vomiting   Iodine Rash    Broke out on skin once years ago--**Betadine Skin Prep**    Current Outpatient Medications on File Prior to Visit  Medication Sig Dispense Refill   albuterol (VENTOLIN HFA) 108 (90 Base) MCG/ACT inhaler Inhale 2 puffs into the lungs every 4 (four) hours as needed for wheezing or shortness of breath. 6.7 g 1   amLODipine (NORVASC) 5 MG tablet Take 1 tablet (5 mg total) by mouth daily. 90 tablet 3   blood glucose meter kit and supplies Dispense based on patient and insurance preference. Use up to four times daily as directed. (FOR ICD-10 E10.9, E11.9). 1 each 0   budesonide (RHINOCORT AQUA) 32 MCG/ACT nasal spray Place 1 spray into both nostrils daily  as needed for allergies.      calcium carbonate (OSCAL) 1500 (600 Ca) MG TABS tablet Take by mouth 2 (two) times daily with a meal.     carvedilol (COREG) 25 MG tablet TAKE 1 TABLET BY MOUTH TWICE DAILY *REFILL REQUEST* 180 tablet 0   Cholecalciferol (VITAMIN D) 50 MCG (2000 UT) tablet Take 2,000 Units by mouth daily.      citalopram (CELEXA) 10 MG tablet Take 0.5 tablets (5 mg total) by mouth every morning. 45 tablet 11   Continuous Glucose Sensor (FREESTYLE LIBRE 2 SENSOR) MISC USE TO check blood glucose AND CHANGE sensor every 14 DAYS 2 each 11   cycloSPORINE (RESTASIS) 0.05 % ophthalmic emulsion Place 1 drop into both eyes 2 (two) times daily.     estradiol (VIVELLE-DOT) 0.05 MG/24HR patch PLACE 1 PATCH TOPICALLY TWICE A WEEK *REFILL REQUEST* 8 patch 10   famotidine (PEPCID) 20 MG tablet Take 1 tablet (20 mg total) by mouth in the morning. 90 tablet 3   Fluticasone-Umeclidin-Vilant (TRELEGY ELLIPTA) 200-62.5-25 MCG/ACT AEPB Inhale 1 puff into the lungs in the morning. 180 each 3   hydrochlorothiazide (HYDRODIURIL) 25 MG tablet Take 1 tablet (25 mg total) by mouth every morning. 90 tablet 3   irbesartan (AVAPRO) 150 MG tablet Take 1 tablet (150 mg total) by mouth daily. 180 tablet 0   Lancets (ONETOUCH DELICA PLUS LANCET33G) MISC USE TO check blood glucose UP TO four times daily AS DIRECTED 100 each 0   levothyroxine (SYNTHROID) 50 MCG tablet Take 1 tablet (50 mcg total) by mouth every morning. 90 tablet 2   loratadine (CLARITIN) 5 MG chewable tablet Chew 5 mg by mouth daily.  metFORMIN (GLUCOPHAGE) 500 MG tablet Take 1 tablet (500 mg total) by mouth 2 (two) times daily with a meal. 180 tablet 11   minoxidil (ROGAINE) 2 % external solution Apply topically daily.     multivitamin-lutein (OCUVITE-LUTEIN) CAPS capsule Take 1 capsule by mouth daily.     ONETOUCH ULTRA test strip USE TO check blood glucose UP TO four times daily AS DIRECTED 100 strip 0   potassium chloride (KLOR-CON) 10 MEQ  tablet TAKE ONE TABLET BY MOUTH EVERY EVENING 90 tablet 2   rosuvastatin (CRESTOR) 10 MG tablet Take 1 tablet (10 mg total) by mouth every other evening. 90 tablet 2   TURMERIC PO Take 500 mg by mouth daily.      No current facility-administered medications on file prior to visit.    BP 110/60   Pulse 65   Temp 98 F (36.7 C) (Oral)   Ht 5' 3.5" (1.613 m)   Wt 192 lb (87.1 kg)   SpO2 97%   BMI 33.48 kg/m       Objective:   Physical Exam Vitals and nursing note reviewed.  Constitutional:      General: She is not in acute distress.    Appearance: Normal appearance. She is not ill-appearing.  HENT:     Head: Normocephalic and atraumatic.     Right Ear: Tympanic membrane, ear canal and external ear normal. There is no impacted cerumen.     Left Ear: Tympanic membrane, ear canal and external ear normal. There is no impacted cerumen.     Nose: Nose normal. No congestion or rhinorrhea.     Mouth/Throat:     Mouth: Mucous membranes are moist.     Pharynx: Oropharynx is clear.  Eyes:     Extraocular Movements: Extraocular movements intact.     Conjunctiva/sclera: Conjunctivae normal.     Pupils: Pupils are equal, round, and reactive to light.  Neck:     Vascular: No carotid bruit.  Cardiovascular:     Rate and Rhythm: Normal rate and regular rhythm.     Pulses: Normal pulses.     Heart sounds: No murmur heard.    No friction rub. No gallop.  Pulmonary:     Effort: Pulmonary effort is normal.     Breath sounds: Normal breath sounds.  Abdominal:     General: Abdomen is flat. Bowel sounds are normal. There is no distension.     Palpations: Abdomen is soft. There is no mass.     Tenderness: There is no abdominal tenderness. There is no guarding or rebound.     Hernia: No hernia is present.  Musculoskeletal:        General: Normal range of motion.     Cervical back: Normal range of motion and neck supple.  Lymphadenopathy:     Cervical: No cervical adenopathy.  Skin:     General: Skin is warm and dry.     Capillary Refill: Capillary refill takes less than 2 seconds.  Neurological:     General: No focal deficit present.     Mental Status: She is alert and oriented to person, place, and time.  Psychiatric:        Mood and Affect: Mood normal.        Behavior: Behavior normal.        Thought Content: Thought content normal.        Judgment: Judgment normal.       Assessment & Plan:  1. Essential hypertension - Well  controlled. No change in medication   2. Diabetes mellitus treated with oral medication (HCC)  - POC HgB A1c- 5.9  - Continue with metformin  - Follow up in 6 months  - Continuous Glucose Receiver (FREESTYLE LIBRE 3 READER) DEVI; Use with libre sensor  Dispense: 1 each; Refill: 0 - Continuous Glucose Sensor (FREESTYLE LIBRE 3 SENSOR) MISC; Place 1 sensor on the skin every 14 days. Use to check glucose continuously  Dispense: 6 each; Refill: 3  3. Generalized anxiety disorder - Will increase to 10 mg daily. She will follow up if not feeling controlled.  - citalopram (CELEXA) 10 MG tablet; Take 1 tablet (10 mg total) by mouth every morning.  Dispense: 90 tablet; Refill: 1   4. Need for immunization against influenza  - Flu Vaccine Trivalent High Dose (Fluad)

## 2023-08-20 NOTE — Patient Instructions (Signed)
Your A1c was 5.9! Keep up the good work   I have increased your Celexa to 10 mg

## 2023-08-21 ENCOUNTER — Other Ambulatory Visit (HOSPITAL_COMMUNITY): Payer: Self-pay

## 2023-08-27 ENCOUNTER — Other Ambulatory Visit (HOSPITAL_COMMUNITY): Payer: Self-pay

## 2023-08-27 ENCOUNTER — Other Ambulatory Visit: Payer: Self-pay

## 2023-08-28 ENCOUNTER — Telehealth: Payer: Self-pay | Admitting: Internal Medicine

## 2023-08-28 ENCOUNTER — Other Ambulatory Visit: Payer: Self-pay

## 2023-08-28 ENCOUNTER — Telehealth: Payer: Self-pay | Admitting: Adult Health

## 2023-08-28 ENCOUNTER — Other Ambulatory Visit (HOSPITAL_COMMUNITY): Payer: Self-pay

## 2023-08-28 MED ORDER — IRBESARTAN 150 MG PO TABS
150.0000 mg | ORAL_TABLET | Freq: Every day | ORAL | 3 refills | Status: DC
  Filled 2023-08-28: qty 90, 90d supply, fill #0
  Filled 2023-09-01: qty 30, 30d supply, fill #0

## 2023-08-28 MED ORDER — CARVEDILOL 25 MG PO TABS
25.0000 mg | ORAL_TABLET | Freq: Two times a day (BID) | ORAL | 3 refills | Status: DC
  Filled 2023-08-28: qty 90, 45d supply, fill #0
  Filled 2023-09-01: qty 60, 30d supply, fill #0
  Filled 2023-09-23 – 2023-09-26 (×2): qty 60, 30d supply, fill #1
  Filled 2023-10-13 – 2023-10-28 (×6): qty 60, 30d supply, fill #2
  Filled 2023-11-10 – 2023-12-01 (×4): qty 60, 30d supply, fill #3
  Filled 2023-12-16 – 2023-12-24 (×2): qty 60, 30d supply, fill #4
  Filled 2023-12-31 – 2024-01-21 (×2): qty 60, 30d supply, fill #5

## 2023-08-28 NOTE — Telephone Encounter (Signed)
Pt's medications were sent to pt's pharmacy as requested. Confirmation received.  

## 2023-08-28 NOTE — Telephone Encounter (Signed)
Amber Brewer is calling and new rx amLODipine (NORVASC) 5 MG tablet , potassium chloride (KLOR-CON) 10 MEQ tablet, estradiol (VIVELLE-DOT) 0.05 MG/24HR patch  also pt takes OTC oyster shell calcium 500 mg, Vit d3 2000 unit,  health eye lutein #30 with refills  [place] [facility] Phone: [phone]  Fax: [phone]

## 2023-08-28 NOTE — Telephone Encounter (Signed)
*  STAT* If patient is at the pharmacy, call can be transferred to refill team.   1. Which medications need to be refilled? (please list name of each medication and dose if known)   carvedilol (COREG) 25 MG tablet  irbesartan (AVAPRO) 150 MG tablet   2. Would you like to learn more about the convenience, safety, & potential cost savings by using the [facility]?   3. Are you open to using the [facility] (Type [facility]. ).  4. Which pharmacy/location (including street and city if local pharmacy) is medication to be sent to?  [place] [facility]   5. Do they need a 30 day or 90 day supply?   30 day with refills  Caller Denny Peon) stated patient has 2-3 days left of this medication.

## 2023-08-29 ENCOUNTER — Other Ambulatory Visit: Payer: Self-pay | Admitting: Adult Health

## 2023-08-29 ENCOUNTER — Other Ambulatory Visit: Payer: Self-pay

## 2023-08-29 ENCOUNTER — Other Ambulatory Visit: Payer: Self-pay | Admitting: Internal Medicine

## 2023-08-29 ENCOUNTER — Other Ambulatory Visit (HOSPITAL_COMMUNITY): Payer: Self-pay

## 2023-08-29 MED ORDER — ESTRADIOL 0.05 MG/24HR TD PTTW
1.0000 | MEDICATED_PATCH | TRANSDERMAL | 10 refills | Status: DC
  Filled 2023-08-29: qty 8, fill #0
  Filled 2023-09-01: qty 8, 28d supply, fill #0
  Filled 2023-09-23: qty 8, 28d supply, fill #1
  Filled 2023-10-13 – 2023-10-28 (×5): qty 8, 28d supply, fill #2
  Filled 2023-11-10 – 2023-11-20 (×2): qty 8, 28d supply, fill #3
  Filled 2023-12-19: qty 8, 28d supply, fill #4
  Filled 2024-01-16: qty 8, 28d supply, fill #5
  Filled 2024-02-09 – 2024-02-20 (×3): qty 8, 28d supply, fill #6
  Filled 2024-03-05 – 2024-03-16 (×3): qty 8, 28d supply, fill #7
  Filled 2024-03-31 – 2024-04-22 (×5): qty 8, 28d supply, fill #8
  Filled 2024-05-07 – 2024-05-18 (×3): qty 8, 28d supply, fill #9
  Filled 2024-06-15: qty 8, 28d supply, fill #10
  Filled ????-??-??: fill #2
  Filled ????-??-??: fill #8

## 2023-08-29 MED ORDER — POTASSIUM CHLORIDE ER 10 MEQ PO TBCR
10.0000 meq | EXTENDED_RELEASE_TABLET | Freq: Every evening | ORAL | 2 refills | Status: DC
  Filled 2023-08-29: qty 90, 90d supply, fill #0
  Filled 2023-09-01: qty 30, 30d supply, fill #0
  Filled 2023-09-23 – 2023-09-26 (×2): qty 30, 30d supply, fill #1
  Filled 2023-10-13 – 2023-10-28 (×6): qty 30, 30d supply, fill #2
  Filled 2023-11-10 – 2023-12-01 (×4): qty 30, 30d supply, fill #3
  Filled 2023-12-16 – 2023-12-24 (×2): qty 30, 30d supply, fill #4
  Filled 2023-12-31 – 2024-01-21 (×2): qty 30, 30d supply, fill #5
  Filled 2024-02-09 – 2024-02-18 (×2): qty 30, 30d supply, fill #6
  Filled 2024-03-05 – 2024-03-16 (×3): qty 30, 30d supply, fill #7
  Filled 2024-03-31 – 2024-04-20 (×2): qty 30, 30d supply, fill #8

## 2023-08-29 MED ORDER — VITAMIN D3 50 MCG (2000 UT) PO CAPS
2000.0000 [IU] | ORAL_CAPSULE | Freq: Every day | ORAL | 3 refills | Status: DC
  Filled 2023-08-29: qty 90, 90d supply, fill #0
  Filled 2023-09-01: qty 30, 30d supply, fill #0
  Filled 2023-09-23 – 2023-09-26 (×2): qty 30, 30d supply, fill #1
  Filled 2023-10-13 – 2023-10-28 (×5): qty 30, 30d supply, fill #2
  Filled 2023-11-10 – 2023-12-01 (×4): qty 30, 30d supply, fill #3
  Filled 2023-12-16 – 2023-12-24 (×2): qty 30, 30d supply, fill #4
  Filled 2023-12-31 – 2024-01-21 (×2): qty 30, 30d supply, fill #5
  Filled 2024-02-09 – 2024-02-18 (×2): qty 30, 30d supply, fill #6
  Filled 2024-03-05 – 2024-03-16 (×3): qty 30, 30d supply, fill #7
  Filled 2024-03-31 – 2024-04-20 (×2): qty 30, 30d supply, fill #8
  Filled 2024-05-07 – 2024-05-18 (×3): qty 30, 30d supply, fill #9
  Filled 2024-06-15: qty 30, 30d supply, fill #10
  Filled 2024-07-15 – 2024-07-20 (×2): qty 30, 30d supply, fill #11

## 2023-08-29 MED ORDER — OCUVITE-LUTEIN PO CAPS
1.0000 | ORAL_CAPSULE | Freq: Every day | ORAL | 3 refills | Status: DC
  Filled 2023-08-29: qty 120, 120d supply, fill #0
  Filled 2023-09-01: qty 30, 30d supply, fill #0
  Filled 2023-09-23 – 2023-09-26 (×2): qty 30, 30d supply, fill #1

## 2023-08-29 MED ORDER — OYSTER SHELL 500 MG PO TABS
500.0000 mg | ORAL_TABLET | Freq: Two times a day (BID) | ORAL | 3 refills | Status: DC
  Filled 2023-08-29: qty 180, 90d supply, fill #0
  Filled 2023-09-01: qty 60, 30d supply, fill #0
  Filled 2023-09-23 – 2023-09-26 (×2): qty 60, 30d supply, fill #1
  Filled 2023-10-13 – 2023-10-28 (×6): qty 60, 30d supply, fill #2
  Filled 2023-11-10 – 2023-12-17 (×5): qty 60, 30d supply, fill #3
  Filled 2024-02-09 – 2024-02-17 (×2): qty 60, 30d supply, fill #4
  Filled 2024-03-05 – 2024-03-16 (×3): qty 60, 30d supply, fill #5
  Filled 2024-03-31 – 2024-04-27 (×4): qty 60, 30d supply, fill #6
  Filled 2024-05-07 – 2024-05-18 (×4): qty 60, 30d supply, fill #7
  Filled 2024-06-15: qty 60, 30d supply, fill #8
  Filled 2024-07-15 – 2024-07-20 (×2): qty 60, 30d supply, fill #9
  Filled 2024-08-12: qty 60, 30d supply, fill #10

## 2023-08-29 NOTE — Telephone Encounter (Signed)
2 prescriptions refilled. Amlodipine not filled this will need to come from [facility] Tried to call pharmacy to advise but no answer.

## 2023-08-29 NOTE — Telephone Encounter (Signed)
Pharmacist notified of update. 

## 2023-08-30 ENCOUNTER — Other Ambulatory Visit (HOSPITAL_COMMUNITY): Payer: Self-pay

## 2023-09-01 ENCOUNTER — Other Ambulatory Visit: Payer: Self-pay

## 2023-09-01 ENCOUNTER — Other Ambulatory Visit (HOSPITAL_COMMUNITY): Payer: Self-pay

## 2023-09-01 ENCOUNTER — Other Ambulatory Visit: Payer: Self-pay | Admitting: Internal Medicine

## 2023-09-01 MED ORDER — AMLODIPINE BESYLATE 5 MG PO TABS
5.0000 mg | ORAL_TABLET | Freq: Every day | ORAL | 3 refills | Status: DC
  Filled 2023-09-01: qty 90, 90d supply, fill #0
  Filled 2023-09-01: qty 30, 30d supply, fill #0
  Filled 2023-09-23 – 2023-09-26 (×2): qty 30, 30d supply, fill #1
  Filled 2023-10-13 – 2023-10-28 (×6): qty 30, 30d supply, fill #2
  Filled 2023-11-10 – 2023-12-01 (×4): qty 30, 30d supply, fill #3
  Filled 2023-12-16 – 2023-12-24 (×2): qty 30, 30d supply, fill #4
  Filled 2023-12-31 – 2024-01-21 (×2): qty 30, 30d supply, fill #5
  Filled 2024-02-09 – 2024-02-17 (×2): qty 30, 30d supply, fill #6
  Filled 2024-03-05 – 2024-03-16 (×3): qty 30, 30d supply, fill #7
  Filled 2024-03-31 – 2024-04-22 (×5): qty 30, 30d supply, fill #8
  Filled 2024-05-07 – 2024-05-18 (×3): qty 30, 30d supply, fill #9
  Filled 2024-06-15: qty 30, 30d supply, fill #10
  Filled 2024-07-15 – 2024-07-20 (×2): qty 30, 30d supply, fill #11

## 2023-09-02 ENCOUNTER — Other Ambulatory Visit: Payer: Self-pay

## 2023-09-02 ENCOUNTER — Other Ambulatory Visit (HOSPITAL_COMMUNITY): Payer: Self-pay

## 2023-09-03 ENCOUNTER — Other Ambulatory Visit: Payer: Self-pay | Admitting: Adult Health

## 2023-09-03 ENCOUNTER — Other Ambulatory Visit: Payer: Self-pay

## 2023-09-03 ENCOUNTER — Other Ambulatory Visit (HOSPITAL_COMMUNITY): Payer: Self-pay

## 2023-09-04 ENCOUNTER — Other Ambulatory Visit (HOSPITAL_COMMUNITY): Payer: Self-pay

## 2023-09-04 ENCOUNTER — Other Ambulatory Visit: Payer: Self-pay | Admitting: Internal Medicine

## 2023-09-04 ENCOUNTER — Other Ambulatory Visit: Payer: Self-pay

## 2023-09-04 MED ORDER — IRBESARTAN 300 MG PO TABS
300.0000 mg | ORAL_TABLET | Freq: Every day | ORAL | 3 refills | Status: DC
  Filled 2023-09-04: qty 30, 30d supply, fill #0
  Filled 2023-09-23 – 2023-09-29 (×3): qty 30, 30d supply, fill #1
  Filled 2023-10-13 – 2023-10-28 (×6): qty 30, 30d supply, fill #2
  Filled 2023-11-10 – 2023-11-20 (×2): qty 30, 30d supply, fill #3

## 2023-09-05 ENCOUNTER — Other Ambulatory Visit (HOSPITAL_COMMUNITY): Payer: Self-pay

## 2023-09-09 ENCOUNTER — Other Ambulatory Visit: Payer: Self-pay

## 2023-09-16 ENCOUNTER — Other Ambulatory Visit: Payer: Self-pay

## 2023-09-23 ENCOUNTER — Other Ambulatory Visit: Payer: Self-pay

## 2023-09-23 ENCOUNTER — Other Ambulatory Visit (HOSPITAL_COMMUNITY): Payer: Self-pay

## 2023-09-24 ENCOUNTER — Other Ambulatory Visit: Payer: Self-pay

## 2023-09-24 ENCOUNTER — Other Ambulatory Visit (HOSPITAL_COMMUNITY): Payer: Self-pay

## 2023-09-25 ENCOUNTER — Other Ambulatory Visit: Payer: Self-pay

## 2023-09-26 ENCOUNTER — Other Ambulatory Visit (HOSPITAL_COMMUNITY): Payer: Self-pay

## 2023-09-26 ENCOUNTER — Other Ambulatory Visit: Payer: Self-pay

## 2023-09-29 ENCOUNTER — Other Ambulatory Visit: Payer: Self-pay

## 2023-09-29 ENCOUNTER — Other Ambulatory Visit (HOSPITAL_COMMUNITY): Payer: Self-pay

## 2023-10-13 ENCOUNTER — Other Ambulatory Visit: Payer: Self-pay

## 2023-10-14 ENCOUNTER — Other Ambulatory Visit: Payer: Self-pay

## 2023-10-17 ENCOUNTER — Other Ambulatory Visit (HOSPITAL_COMMUNITY): Payer: Self-pay

## 2023-10-17 ENCOUNTER — Other Ambulatory Visit: Payer: Self-pay

## 2023-10-21 ENCOUNTER — Other Ambulatory Visit: Payer: Self-pay

## 2023-10-23 ENCOUNTER — Other Ambulatory Visit: Payer: Self-pay

## 2023-10-23 ENCOUNTER — Other Ambulatory Visit (HOSPITAL_COMMUNITY): Payer: Self-pay

## 2023-10-23 ENCOUNTER — Other Ambulatory Visit (HOSPITAL_BASED_OUTPATIENT_CLINIC_OR_DEPARTMENT_OTHER): Payer: Self-pay

## 2023-10-24 ENCOUNTER — Other Ambulatory Visit: Payer: Self-pay

## 2023-10-24 ENCOUNTER — Other Ambulatory Visit (HOSPITAL_COMMUNITY): Payer: Self-pay

## 2023-10-28 ENCOUNTER — Other Ambulatory Visit (HOSPITAL_COMMUNITY): Payer: Self-pay

## 2023-10-28 ENCOUNTER — Other Ambulatory Visit: Payer: Self-pay

## 2023-10-29 ENCOUNTER — Ambulatory Visit: Payer: Self-pay | Admitting: Adult Health

## 2023-10-29 NOTE — Telephone Encounter (Signed)
 Copied from CRM [id number]. Topic: Clinical - Pink Word Triage >> Oct 29, 2023  3:19 PM [place] ORN wrote: Reason for Triage: INFECTION: nose is extremely red & tender pt stated it is very painful to the touch and is afraid the infection will travel to sinuses- duration 5 days now. Pt has used neosporin with no relief   Chief Complaint: nose is red and tender Symptoms: localized bump inside right nostril Frequency: getting worse over the past 4 days Pertinent Negatives: Patient denies fever Disposition: [] ED /[] Urgent Care (no appt availability in office) / [x] Appointment(In office/virtual)/ []  [facility]/ [] Home Care/ [] Refused Recommended Disposition /[] [facility]/ []  Follow-up with PCP Additional Notes: Patient reports painful lump inside nose, unsure if something bit her, states that she woke up 4 days ago and noticed some tenderness. Patient states that she has been using aq-tip to apply neosporin, but the area has gotten more red and tender to touch. Patient has not taken anything for pain, states that she doesn't like to take pain medicine. No appts avail at [facility], pt scheduled [facility].     Reason for Disposition  [1] Swelling is painful to touch AND [2] no fever  Answer Assessment - Initial Assessment Questions 1. APPEARANCE of SWELLING: What does it look like?    Swelling on inside on outside of nose  2. SIZE: How large is the swelling? (e.g., inches, cm; or compare to size of pinhead, tip of pen, eraser, coin, pea, grape, ping pong ball)      Size of a pea  3. LOCATION: Where is the swelling located?     Inside of right nostril  4. ONSET: When did the swelling start?     Started 4 days ago  5. COLOR: What color is it? Is there more than one color?     Very red  6. PAIN: Is there any pain? If Yes, ask: How bad is the pain? (e.g., scale 1-10; or mild, moderate, severe)     - NONE (0): no pain   - MILD (1-3):  doesn't interfere with normal activities    - MODERATE (4-7): interferes with normal activities or awakens from sleep    - SEVERE (8-10): excruciating pain, unable to do any normal activities    Mild pain when touching  7. ITCH: Does it itch? If Yes, ask: How bad is the itch?      Itches occasionally  8. CAUSE: What do you think caused the swelling? Unsure of what caused pain and swelling  9 OTHER SYMPTOMS: Do you have any other symptoms? (e.g., fever)     No OTHER SYMPTOMS  Protocols used: Skin Lump or Localized Swelling-A-AH

## 2023-10-30 ENCOUNTER — Other Ambulatory Visit (HOSPITAL_COMMUNITY): Payer: Self-pay

## 2023-10-30 ENCOUNTER — Other Ambulatory Visit (HOSPITAL_BASED_OUTPATIENT_CLINIC_OR_DEPARTMENT_OTHER): Payer: Self-pay

## 2023-10-30 ENCOUNTER — Ambulatory Visit (INDEPENDENT_AMBULATORY_CARE_PROVIDER_SITE_OTHER): Payer: PPO | Admitting: Physician Assistant

## 2023-10-30 ENCOUNTER — Encounter: Payer: Self-pay | Admitting: Physician Assistant

## 2023-10-30 VITALS — BP 108/70 | HR 64 | Temp 97.2°F | Ht 63.5 in | Wt 193.2 lb

## 2023-10-30 DIAGNOSIS — L03211 Cellulitis of face: Secondary | ICD-10-CM | POA: Diagnosis not present

## 2023-10-30 MED ORDER — MUPIROCIN CALCIUM 2 % EX CREA
1.0000 | TOPICAL_CREAM | Freq: Two times a day (BID) | CUTANEOUS | 0 refills | Status: DC
  Filled 2023-10-30: qty 15, 8d supply, fill #0

## 2023-10-30 MED ORDER — DOXYCYCLINE HYCLATE 100 MG PO TABS
100.0000 mg | ORAL_TABLET | Freq: Two times a day (BID) | ORAL | 0 refills | Status: AC
  Filled 2023-10-30: qty 14, 7d supply, fill #0

## 2023-10-30 NOTE — Progress Notes (Signed)
 Patient ID: Amber Brewer, female    DOB: May 24, 1951, 73 y.o.   MRN: [id number]   Assessment & Plan:  Cellulitis of face  Other orders -     Doxycycline  Hyclate; Take 1 tablet (100 mg total) by mouth 2 (two) times daily for 7 days.  Dispense: 14 tablet; Refill: 0 -     Mupirocin  Calcium ; Apply 1 Application topically 2 (two) times daily.  Dispense: 15 g; Refill: 0    Assessment and Plan    Nasal Cellulitis Red, tender nose with onset 5 days ago. No systemic symptoms. Patient has a history of diabetes, which is well controlled (last A1c 5.9). -Start Doxycycline  100mg  twice daily for 7 days. Caution patient about sun sensitivity. -Apply Bactroban  cream to the nasal passages twice daily using a Q-tip. -If condition worsens, patient to seek urgent medical attention.     No follow-ups on file.    Subjective:    Chief Complaint  Patient presents with   Mass    Pt c/o of bump/lump on nose since Saturday morning, ruptured last night, pt was having itchiness, redness before the nose ruptured, now c/o tenderness on tip of nose. Pt has tried Polysporin that did not help    HPI Discussed the use of AI scribe software for clinical note transcription with the patient, who gave verbal consent to proceed.  History of Present Illness   The patient, with a history of diabetes and rosacea, presents with a red and tender nose that started five days ago. She describes the sensation as 'sizzling' and notes that the area has drained somewhat overnight. She denies fever, chills, and recent illness. She does report seasonal allergies and frequent nose blowing. She has good control of her diabetes with a recent A1c of 5.9, which she attributes to the use of a glucose monitor. She also has a diagnosis of rosacea, but it has not been bothersome recently.       Past Medical History:  Diagnosis Date   Allergy    Anemia    age 70 only   Asthma    Bowen's disease    ~ 17 yrs ago   Chicken  pox    DDD (degenerative disc disease), cervical    Depression    Diabetes mellitus without complication (HCC)    on metformin  - goes back andf forth   Gallstones    GERD (gastroesophageal reflux disease)    Hx of melanoma in situ 2002   Hx: UTI (urinary tract infection)    Hyperlipidemia    Hypertension    Hypothyroidism    IBS (irritable bowel syndrome)    with D   Melanoma (HCC)    x2    Nephrolithiasis    Osteoarthritis    PVC's (premature ventricular contractions)    Status post dilation of esophageal narrowing     Past Surgical History:  Procedure Laterality Date   ABDOMINAL HYSTERECTOMY  1974   APPENDECTOMY  1977   BLADDER SURGERY  1974   BREAST EXCISIONAL BIOPSY     CATARACT EXTRACTION Bilateral 2015   CHOLECYSTECTOMY  1987   COLONOSCOPY     RECTAL SURGERY     ~17 yrs ago- Bowens dx per pt with surgery   removal of melanoma in situ      Family History  Problem Relation Age of Onset   Hypertension Mother    Thyroid  disease Mother    Arthritis Mother    Depression Mother  Hearing loss Mother    Miscarriages / Stillbirths Mother    Heart attack Father    Heart disease Father    Alcohol abuse Father    Arthritis Father    Depression Father    Drug abuse Father    Hypertension Father    Alcohol abuse Sister    Arthritis Sister    Depression Sister    Hearing loss Sister    Hypotension Sister    Depression Sister    Heart disease Sister    Hyperlipidemia Sister    Hypertension Maternal Aunt    Hypertension Maternal Uncle    Clotting disorder Maternal Uncle    Prostate cancer Maternal Uncle    Colon cancer Maternal Uncle    Colon polyps Maternal Uncle    Diabetes Maternal Uncle        all uncles   Arthritis Maternal Grandmother    Asthma Maternal Grandmother    Breast cancer Maternal Grandmother    Stomach cancer Maternal Grandmother    Hearing loss Maternal Grandmother    Diabetes Maternal Grandfather    Heart disease Maternal Grandfather     Arthritis Maternal Grandfather    Hearing loss Maternal Grandfather    Hypertension Maternal Grandfather    Kidney disease Maternal Grandfather    Hyperlipidemia Paternal Grandmother        also MI   Breast cancer Paternal Grandmother    Arthritis Paternal Grandmother    Heart attack Paternal Grandmother    Hyperlipidemia Paternal Grandfather        also MI   Heart disease Paternal Grandfather    Heart attack Paternal Grandfather    Endometriosis Daughter    Hypertension Son    Esophageal cancer Neg Hx    Rectal cancer Neg Hx     Social History   Tobacco Use   Smoking status: Never   Smokeless tobacco: Never  Vaping Use   Vaping status: Never Used  Substance Use Topics   Alcohol use: No   Drug use: No     Allergies  Allergen Reactions   Codeine Phosphate Nausea And Vomiting    REACTION: unspecified   Prednisone Shortness Of Breath   Betadine [Povidone Iodine] Hives    After surgical skin prep   Sulfa Antibiotics Nausea And Vomiting   Iodine Rash    Broke out on skin once years ago--**Betadine Skin Prep**    Review of Systems NEGATIVE UNLESS OTHERWISE INDICATED IN HPI      Objective:     BP 108/70   Pulse 64   Temp (!) 97.2 F (36.2 C)   Ht 5' 3.5 (1.613 m)   Wt 193 lb 3.2 oz (87.6 kg)   SpO2 97%   BMI 33.69 kg/m   Wt Readings from Last 3 Encounters:  10/30/23 193 lb 3.2 oz (87.6 kg)  08/20/23 192 lb (87.1 kg)  07/31/23 193 lb (87.5 kg)    BP Readings from Last 3 Encounters:  10/30/23 108/70  08/20/23 110/60  07/31/23 134/85     Physical Exam  Overall well appearing. Skin: See photo below of tip of nose - erythematous, tender to palpation. No palpable abscess. Internal nares do appear clear.      Beldon Nowling M Dorothy Polhemus, PA-C

## 2023-11-03 DIAGNOSIS — H31091 Other chorioretinal scars, right eye: Secondary | ICD-10-CM | POA: Diagnosis not present

## 2023-11-10 ENCOUNTER — Other Ambulatory Visit: Payer: Self-pay

## 2023-11-12 ENCOUNTER — Other Ambulatory Visit (HOSPITAL_COMMUNITY): Payer: Self-pay

## 2023-11-12 DIAGNOSIS — L578 Other skin changes due to chronic exposure to nonionizing radiation: Secondary | ICD-10-CM | POA: Diagnosis not present

## 2023-11-12 DIAGNOSIS — L821 Other seborrheic keratosis: Secondary | ICD-10-CM | POA: Diagnosis not present

## 2023-11-12 DIAGNOSIS — L219 Seborrheic dermatitis, unspecified: Secondary | ICD-10-CM | POA: Diagnosis not present

## 2023-11-12 DIAGNOSIS — D225 Melanocytic nevi of trunk: Secondary | ICD-10-CM | POA: Diagnosis not present

## 2023-11-12 DIAGNOSIS — Z808 Family history of malignant neoplasm of other organs or systems: Secondary | ICD-10-CM | POA: Diagnosis not present

## 2023-11-12 DIAGNOSIS — L719 Rosacea, unspecified: Secondary | ICD-10-CM | POA: Diagnosis not present

## 2023-11-12 DIAGNOSIS — Z87898 Personal history of other specified conditions: Secondary | ICD-10-CM | POA: Diagnosis not present

## 2023-11-12 DIAGNOSIS — L570 Actinic keratosis: Secondary | ICD-10-CM | POA: Diagnosis not present

## 2023-11-12 DIAGNOSIS — D2271 Melanocytic nevi of right lower limb, including hip: Secondary | ICD-10-CM | POA: Diagnosis not present

## 2023-11-12 MED ORDER — METRONIDAZOLE 0.75 % EX GEL
1.0000 | Freq: Two times a day (BID) | CUTANEOUS | 0 refills | Status: AC
  Filled 2023-11-12: qty 45, 23d supply, fill #0

## 2023-11-17 ENCOUNTER — Other Ambulatory Visit: Payer: Self-pay

## 2023-11-18 ENCOUNTER — Other Ambulatory Visit: Payer: Self-pay

## 2023-11-18 DIAGNOSIS — H43813 Vitreous degeneration, bilateral: Secondary | ICD-10-CM | POA: Diagnosis not present

## 2023-11-20 ENCOUNTER — Other Ambulatory Visit: Payer: Self-pay

## 2023-11-21 ENCOUNTER — Encounter: Payer: Self-pay | Admitting: Internal Medicine

## 2023-11-24 ENCOUNTER — Other Ambulatory Visit: Payer: Self-pay

## 2023-11-26 ENCOUNTER — Other Ambulatory Visit: Payer: Self-pay

## 2023-11-26 ENCOUNTER — Other Ambulatory Visit (HOSPITAL_COMMUNITY): Payer: Self-pay

## 2023-11-26 MED ORDER — IRBESARTAN 150 MG PO TABS
150.0000 mg | ORAL_TABLET | Freq: Two times a day (BID) | ORAL | 3 refills | Status: DC
  Filled 2023-11-26 – 2023-12-01 (×3): qty 60, 30d supply, fill #0
  Filled 2023-12-16 – 2023-12-24 (×2): qty 60, 30d supply, fill #1
  Filled 2023-12-31 – 2024-01-21 (×2): qty 60, 30d supply, fill #2
  Filled 2024-02-09 – 2024-02-17 (×2): qty 60, 30d supply, fill #3
  Filled 2024-03-05 – 2024-03-16 (×3): qty 60, 30d supply, fill #4
  Filled 2024-03-31 – 2024-04-22 (×5): qty 60, 30d supply, fill #5
  Filled 2024-05-07 – 2024-05-18 (×3): qty 60, 30d supply, fill #6
  Filled 2024-06-15: qty 60, 30d supply, fill #7
  Filled 2024-07-15 – 2024-07-20 (×2): qty 60, 30d supply, fill #8
  Filled 2024-08-12: qty 60, 30d supply, fill #9
  Filled 2024-09-08 – 2024-09-14 (×2): qty 60, 30d supply, fill #10
  Filled 2024-10-17 – 2024-10-18 (×2): qty 60, 30d supply, fill #11

## 2023-11-27 ENCOUNTER — Other Ambulatory Visit: Payer: Self-pay

## 2023-11-28 ENCOUNTER — Other Ambulatory Visit (HOSPITAL_COMMUNITY): Payer: Self-pay

## 2023-11-28 ENCOUNTER — Other Ambulatory Visit: Payer: Self-pay

## 2023-11-29 ENCOUNTER — Other Ambulatory Visit (HOSPITAL_COMMUNITY): Payer: Self-pay

## 2023-12-01 ENCOUNTER — Other Ambulatory Visit (HOSPITAL_BASED_OUTPATIENT_CLINIC_OR_DEPARTMENT_OTHER): Payer: Self-pay

## 2023-12-01 ENCOUNTER — Other Ambulatory Visit: Payer: Self-pay

## 2023-12-01 ENCOUNTER — Other Ambulatory Visit (HOSPITAL_COMMUNITY): Payer: Self-pay

## 2023-12-02 ENCOUNTER — Other Ambulatory Visit (HOSPITAL_COMMUNITY): Payer: Self-pay

## 2023-12-10 ENCOUNTER — Other Ambulatory Visit: Payer: Self-pay

## 2023-12-10 ENCOUNTER — Other Ambulatory Visit (HOSPITAL_COMMUNITY): Payer: Self-pay

## 2023-12-11 ENCOUNTER — Other Ambulatory Visit: Payer: Self-pay

## 2023-12-11 ENCOUNTER — Other Ambulatory Visit (HOSPITAL_COMMUNITY): Payer: Self-pay

## 2023-12-15 ENCOUNTER — Other Ambulatory Visit: Payer: Self-pay

## 2023-12-16 ENCOUNTER — Other Ambulatory Visit (HOSPITAL_COMMUNITY): Payer: Self-pay

## 2023-12-16 ENCOUNTER — Ambulatory Visit: Payer: PPO | Admitting: Allergy and Immunology

## 2023-12-16 ENCOUNTER — Other Ambulatory Visit: Payer: Self-pay

## 2023-12-16 VITALS — BP 102/58 | HR 62 | Temp 97.8°F | Resp 16 | Ht 63.0 in | Wt 193.1 lb

## 2023-12-16 DIAGNOSIS — J4540 Moderate persistent asthma, uncomplicated: Secondary | ICD-10-CM | POA: Diagnosis not present

## 2023-12-16 DIAGNOSIS — J3089 Other allergic rhinitis: Secondary | ICD-10-CM

## 2023-12-16 DIAGNOSIS — K219 Gastro-esophageal reflux disease without esophagitis: Secondary | ICD-10-CM | POA: Diagnosis not present

## 2023-12-16 MED ORDER — TRELEGY ELLIPTA 200-62.5-25 MCG/ACT IN AEPB
1.0000 | INHALATION_SPRAY | RESPIRATORY_TRACT | 3 refills | Status: DC
  Filled 2023-12-16: qty 60, 60d supply, fill #0
  Filled 2023-12-17 – 2024-01-07 (×3): qty 60, 30d supply, fill #0
  Filled 2024-01-30: qty 60, 30d supply, fill #1
  Filled 2024-03-01: qty 60, 30d supply, fill #2
  Filled 2024-03-30: qty 60, 30d supply, fill #3
  Filled 2024-04-22 – 2024-04-28 (×4): qty 60, 30d supply, fill #4
  Filled 2024-05-24: qty 60, 30d supply, fill #5

## 2023-12-16 MED ORDER — ALBUTEROL SULFATE HFA 108 (90 BASE) MCG/ACT IN AERS
2.0000 | INHALATION_SPRAY | RESPIRATORY_TRACT | 1 refills | Status: AC | PRN
  Filled 2023-12-16: qty 6.7, 17d supply, fill #0

## 2023-12-16 MED ORDER — CETIRIZINE HCL 10 MG PO TABS
10.0000 mg | ORAL_TABLET | Freq: Every day | ORAL | 1 refills | Status: DC | PRN
  Filled 2023-12-16: qty 180, 90d supply, fill #0
  Filled 2023-12-16: qty 180, 180d supply, fill #0
  Filled 2023-12-24 (×2): qty 60, 30d supply, fill #0
  Filled 2023-12-31 – 2024-01-21 (×2): qty 60, 30d supply, fill #1
  Filled 2024-02-09 – 2024-02-20 (×3): qty 60, 30d supply, fill #2
  Filled 2024-03-05 – 2024-03-16 (×3): qty 60, 30d supply, fill #3
  Filled 2024-03-31 – 2024-04-22 (×4): qty 60, 30d supply, fill #4
  Filled 2024-05-07 – 2024-05-18 (×3): qty 60, 30d supply, fill #5
  Filled ????-??-??: fill #4

## 2023-12-16 MED ORDER — FAMOTIDINE 40 MG PO TABS
40.0000 mg | ORAL_TABLET | Freq: Every day | ORAL | 3 refills | Status: AC
  Filled 2023-12-16 (×2): qty 90, 90d supply, fill #0
  Filled 2023-12-24: qty 30, 30d supply, fill #0
  Filled 2023-12-31 – 2024-01-21 (×2): qty 30, 30d supply, fill #1
  Filled 2024-02-09 – 2024-02-17 (×2): qty 30, 30d supply, fill #2
  Filled 2024-03-05 – 2024-03-16 (×3): qty 30, 30d supply, fill #3
  Filled 2024-03-31 – 2024-04-22 (×5): qty 30, 30d supply, fill #4
  Filled 2024-05-07 – 2024-05-18 (×3): qty 30, 30d supply, fill #5
  Filled 2024-06-15: qty 30, 30d supply, fill #6
  Filled 2024-07-15 – 2024-07-20 (×2): qty 30, 30d supply, fill #7
  Filled 2024-08-12: qty 30, 30d supply, fill #8
  Filled 2024-09-08 – 2024-09-14 (×2): qty 30, 30d supply, fill #9
  Filled 2024-10-17 – 2024-10-18 (×2): qty 30, 30d supply, fill #10
  Filled 2024-11-16 – 2024-11-17 (×2): qty 30, 30d supply, fill #11

## 2023-12-16 MED ORDER — TRIAMCINOLONE ACETONIDE 55 MCG/ACT NA AERO
1.0000 | INHALATION_SPRAY | Freq: Two times a day (BID) | NASAL | 3 refills | Status: AC
  Filled 2023-12-16: qty 50.7, 90d supply, fill #0
  Filled 2023-12-17: qty 50.7, 2d supply, fill #0

## 2023-12-16 NOTE — Patient Instructions (Addendum)
    1. Continue to Treat inflammation:   A. DECREASE Trelegy 200 - 3-7 times per week  B. Nasacort 1 spray each nostril 1-2 times per day  2. Continue to Treat reflux:   A. INCREASE Famotidine 40 mg - one time per day  3. If needed:   A. nasal saline wash  B. ProAir HFA 2 puffs every 4-6 hours  C. OTC antihistamine - Claritin/Allegra/Zyrtec  D. OTC Mucinex DM 2 tablets twice a day  4. Return to clinic in 12 months or earlier if problem  5. Influenza = Tamiflu. Covid = Paxlovid

## 2023-12-16 NOTE — Progress Notes (Unsigned)
 [place] [facility] [place] [facility] [place] [facility] [place]   Follow-up Note  Referring Provider: [name], NP Primary Provider: [name], NP Date of Office Visit: 12/16/2023  Subjective:   Amber Brewer (DOB: 06-07-1951) is a 73 y.o. female who [facility] in re-evaluation of the following:  HPI: Tyrene returns to this clinic in evaluation of asthma, allergic rhinitis, LPR.  I last saw her in this clinic 24 December 2022.  She has really done well with her asthma and rarely uses a short acting bronchodilator where she continues on a triple inhaler on a daily basis.  She can exert herself without any problem and she has not required a systemic steroid to treat an exacerbation.  Her nose has really been doing very well while using Nasacort on most days.  She apparently did develop an episode of cellulitis affecting her nose a few months ago.  She woke up one morning and her nose was purple and red and extremely painful only on one side and there appeared to be a definitive midline separation between what skin was affected and what was not.  She went to the urgent care center and she was treated with antibiotics and it really took a very long time for her nose to respond to that antibiotic and even now her nose is just a little bit swollen although all the pain has since resolved and all the redness has resolved.  Her reflux is under good control but she does have some mucus in her throat and some throat clearing and a little bit intermittent hoarseness while using her famotidine at 20 mg daily.  She has obtained this years flu vaccine.  Allergies as of 12/16/2023       Reactions   Prednisone Shortness Of Breath   Betadine [povidone Iodine] Hives   After surgical skin prep   Codeine Phosphate Nausea And Vomiting   Iodine Rash   Broke out on skin once years ago--**Betadine Skin Prep**   Sulfa Antibiotics Nausea And Vomiting         Medication List    albuterol 108 (90 Base) MCG/ACT inhaler Commonly known as: VENTOLIN HFA Inhale 2 puffs into the lungs every 4 (four) hours as needed for wheezing or shortness of breath.   amLODipine 5 MG tablet Commonly known as: NORVASC Take 1 tablet (5 mg total) by mouth daily.   blood glucose meter kit and supplies Dispense based on patient and insurance preference. Use up to four times daily as directed. (FOR ICD-10 E10.9, E11.9).   budesonide 32 MCG/ACT nasal spray Commonly known as: RHINOCORT AQUA Place 1 spray into both nostrils daily as needed for allergies.   calcium carbonate 1500 (600 Ca) MG Tabs tablet Commonly known as: OSCAL Take by mouth 2 (two) times daily with a meal.   carvedilol 25 MG tablet Commonly known as: COREG Take 1 tablet (25 mg total) by mouth 2 (two) times daily.   citalopram 10 MG tablet Commonly known as: CELEXA Take 1 tablet (10 mg total) by mouth every morning.   cycloSPORINE 0.05 % ophthalmic emulsion Commonly known as: RESTASIS Place 1 drop into both eyes 2 (two) times daily.   estradiol 0.05 MG/24HR patch Commonly known as: VIVELLE-DOT Place 1 patch (0.05 mg total) onto the skin 2 (two) times a week.   famotidine 20 MG tablet Commonly known as: PEPCID Take 1 tablet (20 mg total) by mouth in the morning.  FreeStyle Libre 3 Plus Sensor Misc Change sensor every 14 days.   FreeStyle Etna 3 Reader Hardie Pulley Use with libre sensor   hydrochlorothiazide 25 MG tablet Commonly known as: HYDRODIURIL Take 1 tablet (25 mg total) by mouth every morning.   irbesartan 150 MG tablet Commonly known as: AVAPRO Take 1 tablet (150 mg total) by mouth in the morning and at bedtime.   loratadine 5 MG chewable tablet Commonly known as: CLARITIN Chew 5 mg by mouth daily.   metFORMIN 500 MG tablet Commonly known as: GLUCOPHAGE Take 1 tablet (500 mg total) by mouth 2 (two) times daily with a meal.   metroNIDAZOLE 0.75 % gel Commonly known as:  METROGEL Apply 1 Application topically 2 (two) times daily.   minoxidil 2 % external solution Commonly known as: ROGAINE Apply topically daily.   mupirocin cream 2 % Commonly known as: BACTROBAN Apply 1 Application topically 2 (two) times daily.   OneTouch Delica Plus Lancet33G Misc USE TO check blood glucose UP TO four times daily AS DIRECTED   OneTouch Ultra test strip Generic drug: glucose blood USE TO check blood glucose UP TO four times daily AS DIRECTED   Oyster Shell Calcium 500 MG Tabs Take 1 tablet (500 mg total) by mouth 2 (two) times daily.   potassium chloride 10 MEQ tablet Commonly known as: KLOR-CON Take 1 tablet (10 mEq total) by mouth every evening.   rosuvastatin 10 MG tablet Commonly known as: CRESTOR Take 1 tablet (10 mg total) by mouth every other evening.   Synthroid 50 MCG tablet Generic drug: levothyroxine Take 1 tablet (50 mcg total) by mouth every morning.   Trelegy Ellipta 200-62.5-25 MCG/ACT Aepb Generic drug: Fluticasone-Umeclidin-Vilant Inhale 1 puff into the lungs in the morning.   Turmeric 500 MG Caps SMARTSIG:1 Capsule(s) By Mouth Every Evening   Vitamin D 50 MCG (2000 UT) tablet Take 2,000 Units by mouth daily.   Vitamin D3 50 MCG (2000 UT) capsule Take 1 capsule (2,000 Units total) by mouth daily.    Past Medical History:  Diagnosis Date   Allergy    Anemia    age 56 only   Asthma    Bowen's disease    ~ 17 yrs ago   Chicken pox    DDD (degenerative disc disease), cervical    Depression    Diabetes mellitus without complication (HCC)    on metformin - goes back andf forth   Gallstones    GERD (gastroesophageal reflux disease)    Hx of melanoma in situ 2002   Hx: UTI (urinary tract infection)    Hyperlipidemia    Hypertension    Hypothyroidism    IBS (irritable bowel syndrome)    with D   Melanoma (HCC)    x2    Nephrolithiasis    Osteoarthritis    PVC's (premature ventricular contractions)    Status post  dilation of esophageal narrowing     Past Surgical History:  Procedure Laterality Date   ABDOMINAL HYSTERECTOMY  1974   APPENDECTOMY  1977   BLADDER SURGERY  1974   BREAST EXCISIONAL BIOPSY     CATARACT EXTRACTION Bilateral 2015   CHOLECYSTECTOMY  1987   COLONOSCOPY     RECTAL SURGERY     ~17 yrs ago- Bowens dx per pt with surgery   removal of melanoma in situ      Review of systems negative except as noted in HPI / PMHx or noted below:  Review of Systems  Constitutional: Negative.  HENT: Negative.    Eyes: Negative.   Respiratory: Negative.    Cardiovascular: Negative.   Gastrointestinal: Negative.   Genitourinary: Negative.   Musculoskeletal: Negative.   Skin: Negative.   Neurological: Negative.   Endo/Heme/Allergies: Negative.   Psychiatric/Behavioral: Negative.       Objective:   Vitals:   12/16/23 1329  BP: (!) 102/58  Pulse: 62  Resp: 16  Temp: 97.8 F (36.6 C)  SpO2: 94%   Height: 5\' 3"  (160 cm)  Weight: 193 lb 1.6 oz (87.6 kg)   Physical Exam Constitutional:      Appearance: She is not diaphoretic.  HENT:     Head: Normocephalic.     Right Ear: Tympanic membrane, ear canal and external ear normal.     Left Ear: Tympanic membrane, ear canal and external ear normal.     Nose: Nose normal. No mucosal edema or rhinorrhea.     Mouth/Throat:     Pharynx: Uvula midline. No oropharyngeal exudate.  Eyes:     Conjunctiva/sclera: Conjunctivae normal.  Neck:     Thyroid: No thyromegaly.     Trachea: Trachea normal. No tracheal tenderness or tracheal deviation.  Cardiovascular:     Rate and Rhythm: Normal rate and regular rhythm.     Heart sounds: Normal heart sounds, S1 normal and S2 normal. No murmur heard. Pulmonary:     Effort: No respiratory distress.     Breath sounds: Normal breath sounds. No stridor. No wheezing or rales.  Lymphadenopathy:     Head:     Right side of head: No tonsillar adenopathy.     Left side of head: No tonsillar  adenopathy.     Cervical: No cervical adenopathy.  Skin:    Findings: No erythema or rash.     Nails: There is no clubbing.  Neurological:     Mental Status: She is alert.     Diagnostics: Spirometry was performed and demonstrated an FEV1 of 1.65 at 80  % of predicted.  Assessment and Plan:   1. Asthma, moderate persistent, well-controlled   2. Other allergic rhinitis   3. LPRD (laryngopharyngeal reflux disease)     1. Continue to Treat inflammation:   A. DECREASE Trelegy 200 - 3-7 times per week  B. Nasacort 1 spray each nostril 1-2 times per day  2. Continue to Treat reflux:   A. INCREASE Famotidine 40 mg - one time per day  3. If needed:   A. nasal saline wash  B. ProAir HFA 2 puffs every 4-6 hours  C. OTC antihistamine - Claritin/Allegra/Zyrtec  D. OTC Mucinex DM 2 tablets twice a day  4. Return to clinic in 12 months or earlier if problem  5. Influenza = Tamiflu. Covid = Paxlovid  Tatyana appears to be doing quite well regarding her respiratory tract while using anti-inflammatory agents for both her upper and lower respiratory tract and I think there is an opportunity to consolidate some of her treatment and we will decrease her Trelegy to just 3 times per week.  She has a very good understanding of her disease state and how her medications work and appropriate dosing of her medication depending on disease activity.  It sounds as though she might be having little bit more problem with LPR and she can increase her famotidine to 40 mg we will see what happens with that change.  It is interesting to note that her episode of cellulitis that occurred a few months ago only involve the right  side of her nose and it was a definitive midline demarcation between affected and unaffected skin.  She describes a very painful and inflamed patch of skin and I wonder if she did not have a localized form of shingles involving just the branch of trigeminal nerve that innervates the right side  of the nose.  In any regard, she is a lot better although she still has a sensation that things are little bit thickened in that area and that is not very surprising given the extent of the inflammation that apparently occurred with this event.  Laurette Schimke, MD Allergy / Immunology [facility]

## 2023-12-17 ENCOUNTER — Other Ambulatory Visit (HOSPITAL_COMMUNITY): Payer: Self-pay

## 2023-12-17 ENCOUNTER — Other Ambulatory Visit: Payer: Self-pay

## 2023-12-17 ENCOUNTER — Encounter: Payer: Self-pay | Admitting: Allergy and Immunology

## 2023-12-19 ENCOUNTER — Other Ambulatory Visit (HOSPITAL_COMMUNITY): Payer: Self-pay

## 2023-12-19 ENCOUNTER — Other Ambulatory Visit: Payer: Self-pay

## 2023-12-24 ENCOUNTER — Other Ambulatory Visit: Payer: Self-pay

## 2023-12-24 ENCOUNTER — Other Ambulatory Visit (HOSPITAL_COMMUNITY): Payer: Self-pay

## 2023-12-29 ENCOUNTER — Other Ambulatory Visit: Payer: Self-pay

## 2023-12-29 ENCOUNTER — Other Ambulatory Visit (HOSPITAL_COMMUNITY): Payer: Self-pay

## 2023-12-30 ENCOUNTER — Other Ambulatory Visit (HOSPITAL_COMMUNITY): Payer: Self-pay

## 2023-12-31 ENCOUNTER — Other Ambulatory Visit: Payer: Self-pay

## 2024-01-02 ENCOUNTER — Other Ambulatory Visit: Payer: Self-pay

## 2024-01-07 ENCOUNTER — Other Ambulatory Visit: Payer: Self-pay

## 2024-01-07 ENCOUNTER — Other Ambulatory Visit (HOSPITAL_COMMUNITY): Payer: Self-pay

## 2024-01-08 ENCOUNTER — Other Ambulatory Visit (HOSPITAL_COMMUNITY): Payer: Self-pay

## 2024-01-09 ENCOUNTER — Other Ambulatory Visit (HOSPITAL_COMMUNITY): Payer: Self-pay

## 2024-01-16 ENCOUNTER — Other Ambulatory Visit: Payer: Self-pay

## 2024-01-16 ENCOUNTER — Other Ambulatory Visit (HOSPITAL_COMMUNITY): Payer: Self-pay

## 2024-01-19 ENCOUNTER — Other Ambulatory Visit (HOSPITAL_COMMUNITY): Payer: Self-pay

## 2024-01-20 ENCOUNTER — Other Ambulatory Visit (HOSPITAL_COMMUNITY): Payer: Self-pay

## 2024-01-21 ENCOUNTER — Other Ambulatory Visit: Payer: Self-pay

## 2024-01-22 ENCOUNTER — Other Ambulatory Visit: Payer: Self-pay

## 2024-01-22 ENCOUNTER — Other Ambulatory Visit (HOSPITAL_COMMUNITY): Payer: Self-pay

## 2024-01-30 ENCOUNTER — Other Ambulatory Visit: Payer: Self-pay

## 2024-02-09 ENCOUNTER — Other Ambulatory Visit: Payer: Self-pay | Admitting: Internal Medicine

## 2024-02-09 ENCOUNTER — Other Ambulatory Visit: Payer: Self-pay | Admitting: Adult Health

## 2024-02-09 ENCOUNTER — Other Ambulatory Visit: Payer: Self-pay

## 2024-02-09 DIAGNOSIS — F411 Generalized anxiety disorder: Secondary | ICD-10-CM

## 2024-02-10 ENCOUNTER — Other Ambulatory Visit (HOSPITAL_COMMUNITY): Payer: Self-pay

## 2024-02-10 MED ORDER — CITALOPRAM HYDROBROMIDE 10 MG PO TABS
10.0000 mg | ORAL_TABLET | Freq: Every morning | ORAL | 1 refills | Status: DC
  Filled 2024-02-17: qty 30, 30d supply, fill #0
  Filled 2024-03-05 – 2024-03-16 (×3): qty 30, 30d supply, fill #1
  Filled 2024-03-31 – 2024-04-22 (×5): qty 30, 30d supply, fill #2
  Filled 2024-05-07 – 2024-05-18 (×3): qty 30, 30d supply, fill #3
  Filled 2024-06-15: qty 30, 30d supply, fill #4
  Filled 2024-07-15 – 2024-07-20 (×2): qty 30, 30d supply, fill #5

## 2024-02-11 ENCOUNTER — Other Ambulatory Visit: Payer: Self-pay

## 2024-02-11 ENCOUNTER — Other Ambulatory Visit (HOSPITAL_COMMUNITY): Payer: Self-pay

## 2024-02-11 MED ORDER — CARVEDILOL 25 MG PO TABS
25.0000 mg | ORAL_TABLET | Freq: Two times a day (BID) | ORAL | 1 refills | Status: DC
  Filled 2024-02-11: qty 180, 90d supply, fill #0
  Filled 2024-02-18: qty 60, 30d supply, fill #0
  Filled 2024-03-05 – 2024-03-16 (×3): qty 60, 30d supply, fill #1
  Filled 2024-03-31 – 2024-04-22 (×5): qty 60, 30d supply, fill #2
  Filled 2024-05-07 – 2024-05-18 (×3): qty 60, 30d supply, fill #3
  Filled 2024-06-15: qty 60, 30d supply, fill #4
  Filled 2024-07-15 – 2024-07-20 (×2): qty 60, 30d supply, fill #5

## 2024-02-13 ENCOUNTER — Other Ambulatory Visit: Payer: Self-pay

## 2024-02-17 ENCOUNTER — Other Ambulatory Visit: Payer: Self-pay

## 2024-02-17 ENCOUNTER — Other Ambulatory Visit (HOSPITAL_COMMUNITY): Payer: Self-pay

## 2024-02-18 ENCOUNTER — Encounter: Payer: Self-pay | Admitting: Adult Health

## 2024-02-18 ENCOUNTER — Ambulatory Visit: Payer: PPO | Admitting: Adult Health

## 2024-02-18 ENCOUNTER — Other Ambulatory Visit: Payer: Self-pay

## 2024-02-18 ENCOUNTER — Other Ambulatory Visit (HOSPITAL_COMMUNITY): Payer: Self-pay

## 2024-02-18 VITALS — BP 110/58 | HR 61 | Temp 97.5°F | Ht 63.0 in | Wt 195.0 lb

## 2024-02-18 DIAGNOSIS — Z7984 Long term (current) use of oral hypoglycemic drugs: Secondary | ICD-10-CM | POA: Diagnosis not present

## 2024-02-18 DIAGNOSIS — E119 Type 2 diabetes mellitus without complications: Secondary | ICD-10-CM | POA: Diagnosis not present

## 2024-02-18 DIAGNOSIS — I10 Essential (primary) hypertension: Secondary | ICD-10-CM

## 2024-02-18 LAB — POCT GLYCOSYLATED HEMOGLOBIN (HGB A1C): Hemoglobin A1C: 6 % — AB (ref 4.0–5.6)

## 2024-02-18 NOTE — Patient Instructions (Signed)
 Your A1c 6.0!   Try using Glucerna before bed to keep your blood sugars from going down.  Follow up at the end of July for your annual exam

## 2024-02-18 NOTE — Progress Notes (Signed)
 Subjective:    Patient ID: Amber Brewer, female    DOB: September 12, 1951, 73 y.o.   MRN: [id number]  HPI 73 year old female who  has a past medical history of Allergy, Anemia, Asthma, Bowen's disease, Chicken pox, DDD (degenerative disc disease), cervical, Depression, Diabetes mellitus without complication (HCC), Gallstones, GERD (gastroesophageal reflux disease), melanoma in situ (2002), UTI (urinary tract infection), Hyperlipidemia, Hypertension, Hypothyroidism, IBS (irritable bowel syndrome), Melanoma (HCC), Nephrolithiasis, Osteoarthritis, PVC's (premature ventricular contractions), and Status post dilation of esophageal narrowing.  DM Type 2 -formally diagnosed in July 2023 at her CPE when her A1c was 6.9.  At this time she was on metformin  250 daily for prediabetes.  This was changed to 500 mg twice daily.  She has been tolerating this medication well and has been working on lifestyle modifications.  She continues to use the libre CGM. She did not bring her reader today. She reports that her for the last two months she has been experiencing hypoglycemic episodes and her sensor alarm goes off - this is happening about every night. She has tried increasing protein before bed but did not notice any difference with this   Lab Results  Component Value Date   HGBA1C 5.9 (A) 08/20/2023   HGBA1C 6.3 05/20/2023   HGBA1C 5.8 (A) 12/10/2022    Wt Readings from Last 3 Encounters:  02/18/24 195 lb (88.5 kg)  12/16/23 193 lb 1.6 oz (87.6 kg)  10/30/23 193 lb 3.2 oz (87.6 kg)   Hypertension-managed with Norvasc  5 mg daily, Coreg  25 mg twice daily, HCTZ 25 mg daily, and Avapro  150 mg daily.  He does take a potassium supplement of 10 meq .  She checks her blood pressures at home with readings mostly in the 120s over 70s BP Readings from Last 3 Encounters:  02/18/24 (!) 110/58  12/16/23 (!) 102/58  10/30/23 108/70     Review of Systems See HPI   Past Medical History:  Diagnosis Date   Allergy     Anemia    age 16 only   Asthma    Bowen's disease    ~ 17 yrs ago   Chicken pox    DDD (degenerative disc disease), cervical    Depression    Diabetes mellitus without complication (HCC)    on metformin  - goes back andf forth   Gallstones    GERD (gastroesophageal reflux disease)    Hx of melanoma in situ 2002   Hx: UTI (urinary tract infection)    Hyperlipidemia    Hypertension    Hypothyroidism    IBS (irritable bowel syndrome)    with D   Melanoma (HCC)    x2    Nephrolithiasis    Osteoarthritis    PVC's (premature ventricular contractions)    Status post dilation of esophageal narrowing     Social History   Socioeconomic History   Marital status: Married    Spouse name: Not on file   Number of children: 2   Years of education: 13   Highest education level: Not on file  Occupational History   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  Tobacco Use   Smoking status: Never   Smokeless tobacco: Never  Vaping Use   Vaping status: Never Used  Substance and Sexual Activity   Alcohol use: No   Drug use: No   Sexual activity: Yes    Birth control/protection: Post-menopausal, Surgical  Other Topics Concern  Not on file  Social History Narrative   epworth sleepiness scale = 8 (01/18/2016)   Lives at home with her husband and disabled son   Right handed   Caffeine: decaf   Social Drivers of Health   Financial Resource Strain: Medium Risk (05/01/2023)   Overall Financial Resource Strain (CARDIA)    Difficulty of Paying Living Expenses: Somewhat hard  Food Insecurity: Food Insecurity Present (05/01/2023)   Hunger Vital Sign    Worried About Running Out of Food in the Last Year: Sometimes true    Ran Out of Food in the Last Year: Never true  Transportation Needs: No Transportation Needs (05/01/2023)   PRAPARE - Administrator, Civil Service (Medical): No    Lack of Transportation (Non-Medical): No  Physical  Activity: Insufficiently Active (05/01/2023)   Exercise Vital Sign    Days of Exercise per Week: 4 days    Minutes of Exercise per Session: 30 min  Stress: Stress Concern Present (05/01/2023)   [employer] of Occupational Health - Occupational Stress Questionnaire    Feeling of Stress : To some extent  Social Connections: Moderately Integrated (05/01/2023)   Social Connection and Isolation Panel [NHANES]    Frequency of Communication with Friends and Family: More than three times a week    Frequency of Social Gatherings with Friends and Family: Once a week    Attends Religious Services: More than 4 times per year    Active Member of [employer] or Organizations: No    Attends Banker Meetings: Never    Marital Status: Married  Catering manager Violence: Not At Risk (05/01/2023)   Humiliation, Afraid, Rape, and Kick questionnaire    Fear of Current or Ex-Partner: No    Emotionally Abused: No    Physically Abused: No    Sexually Abused: No    Past Surgical History:  Procedure Laterality Date   ABDOMINAL HYSTERECTOMY  1974   APPENDECTOMY  1977   BLADDER SURGERY  1974   BREAST EXCISIONAL BIOPSY     CATARACT EXTRACTION Bilateral 2015   CHOLECYSTECTOMY  1987   COLONOSCOPY     RECTAL SURGERY     ~17 yrs ago- [facility]ens dx per pt with surgery   removal of melanoma in situ      Family History  Problem Relation Age of Onset   Hypertension Mother    Thyroid  disease Mother    Arthritis Mother    Depression Mother    Hearing loss Mother    Miscarriages / Stillbirths Mother    Heart attack Father    Heart disease Father    Alcohol abuse Father    Arthritis Father    Depression Father    Drug abuse Father    Hypertension Father    Alcohol abuse Sister    Arthritis Sister    Depression Sister    Hearing loss Sister    Hypotension Sister    Depression Sister    Heart disease Sister    Hyperlipidemia Sister    Hypertension Maternal Aunt    Hypertension Maternal Uncle     Clotting disorder Maternal Uncle    Prostate cancer Maternal Uncle    Colon cancer Maternal Uncle    Colon polyps Maternal Uncle    Diabetes Maternal Uncle        all uncles   Arthritis Maternal Grandmother    Asthma Maternal Grandmother    Breast cancer Maternal Grandmother    Stomach cancer Maternal Grandmother  Hearing loss Maternal Grandmother    Diabetes Maternal Grandfather    Heart disease Maternal Grandfather    Arthritis Maternal Grandfather    Hearing loss Maternal Grandfather    Hypertension Maternal Grandfather    Kidney disease Maternal Grandfather    Hyperlipidemia Paternal Grandmother        also MI   Breast cancer Paternal Grandmother    Arthritis Paternal Grandmother    Heart attack Paternal Grandmother    Hyperlipidemia Paternal Grandfather        also MI   Heart disease Paternal Grandfather    Heart attack Paternal Grandfather    Endometriosis Daughter    Hypertension Son    Esophageal cancer Neg Hx    Rectal cancer Neg Hx     Allergies  Allergen Reactions   Prednisone Shortness Of Breath   Betadine [Povidone Iodine] Hives    After surgical skin prep   Codeine Phosphate Nausea And Vomiting   Iodine Rash    Broke out on skin once years ago--**Betadine Skin Prep**   Sulfa Antibiotics Nausea And Vomiting    Current Outpatient Medications on File Prior to Visit  Medication Sig Dispense Refill   albuterol  (VENTOLIN  HFA) 108 (90 Base) MCG/ACT inhaler Inhale 2 puffs into the lungs every 4 (four) hours as needed for wheezing or shortness of breath. 6.7 g 1   amLODipine  (NORVASC ) 5 MG tablet Take 1 tablet (5 mg total) by mouth daily. 90 tablet 3   blood glucose meter kit and supplies Dispense based on patient and insurance preference. Use up to four times daily as directed. (FOR ICD-10 E10.9, E11.9). 1 each 0   budesonide (RHINOCORT AQUA) 32 MCG/ACT nasal spray Place 1 spray into both nostrils daily as needed for allergies.      calcium  carbonate  (OSCAL) 1500 (600 Ca) MG TABS tablet Take by mouth 2 (two) times daily with a meal.     carvedilol  (COREG ) 25 MG tablet Take 1 tablet (25 mg total) by mouth 2 (two) times daily. 180 tablet 1   cetirizine  (ZYRTEC ) 10 MG tablet Take 1 tablet (10 mg total) by mouth daily as needed (Can take an extra dose during flare ups.). 180 tablet 1   Cholecalciferol  (VITAMIN D) 50 MCG (2000 UT) tablet Take 2,000 Units by mouth daily.     Cholecalciferol  (VITAMIN D3) 50 MCG (2000 UT) capsule Take 1 capsule (2,000 Units total) by mouth daily. 90 capsule 3   citalopram  (CELEXA ) 10 MG tablet Take 1 tablet (10 mg total) by mouth every morning. 90 tablet 1   Continuous Glucose Receiver (FREESTYLE LIBRE 3 READER) DEVI Use with libre sensor 1 each 0   Continuous Glucose Sensor (FREESTYLE LIBRE 3 PLUS SENSOR) MISC Change sensor every 14 days. 6 each 3   cycloSPORINE (RESTASIS) 0.05 % ophthalmic emulsion Place 1 drop into both eyes 2 (two) times daily.     estradiol  (VIVELLE -DOT) 0.05 MG/24HR patch Place 1 patch (0.05 mg total) onto the skin 2 (two) times a week. 8 patch 10   famotidine  (PEPCID ) 40 MG tablet Take 1 tablet (40 mg total) by mouth daily before breakfast. 90 tablet 3   Fluticasone -Umeclidin-Vilant (TRELEGY ELLIPTA ) 200-62.5-25 MCG/ACT AEPB Inhale 1 puff into the lungs every other day. 84 each 3   hydrochlorothiazide  (HYDRODIURIL ) 25 MG tablet Take 1 tablet (25 mg total) by mouth every morning. 90 tablet 3   irbesartan  (AVAPRO ) 150 MG tablet Take 1 tablet (150 mg total) by mouth in the morning and  at bedtime. 180 tablet 3   Lancets (ONETOUCH DELICA PLUS LANCET33G) MISC USE TO check blood glucose UP TO four times daily AS DIRECTED 100 each 0   levothyroxine  (SYNTHROID ) 50 MCG tablet Take 1 tablet (50 mcg total) by mouth every morning. 90 tablet 2   loratadine (CLARITIN) 5 MG chewable tablet Chew 5 mg by mouth daily.     metFORMIN  (GLUCOPHAGE ) 500 MG tablet Take 1 tablet (500 mg total) by mouth 2 (two) times daily  with a meal. 180 tablet 11   metroNIDAZOLE  (METROGEL ) 0.75 % gel Apply 1 Application topically 2 (two) times daily. 45 g 0   minoxidil (ROGAINE) 2 % external solution Apply topically daily.     mupirocin  cream (BACTROBAN ) 2 % Apply 1 Application topically 2 (two) times daily. 15 g 0   ONETOUCH ULTRA test strip USE TO check blood glucose UP TO four times daily AS DIRECTED 100 strip 0   Oyster Shell 500 MG TABS Take 1 tablet (500 mg total) by mouth 2 (two) times daily. 180 tablet 3   potassium chloride  (KLOR-CON ) 10 MEQ tablet Take 1 tablet (10 mEq total) by mouth every evening. 90 tablet 2   rosuvastatin  (CRESTOR ) 10 MG tablet Take 1 tablet (10 mg total) by mouth every other evening. 90 tablet 2   triamcinolone  (NASACORT ) 55 MCG/ACT AERO nasal inhaler Place 1 spray into the nose 2 (two) times daily. 50.7 mL 3   Turmeric 500 MG CAPS SMARTSIG:1 Capsule(s) By Mouth Every Evening     No current facility-administered medications on file prior to visit.    BP (!) 110/58   Pulse 61   Temp (!) 97.5 F (36.4 C) (Oral)   Ht 5\' 3"  (1.6 m)   Wt 195 lb (88.5 kg)   SpO2 97%   BMI 34.54 kg/m       Objective:   Physical Exam Vitals and nursing note reviewed.  Constitutional:      Appearance: Normal appearance. She is obese.  Cardiovascular:     Rate and Rhythm: Normal rate and regular rhythm.     Pulses: Normal pulses.     Heart sounds: Normal heart sounds.  Pulmonary:     Effort: Pulmonary effort is normal.     Breath sounds: Normal breath sounds.  Skin:    General: Skin is warm and dry.  Neurological:     General: No focal deficit present.     Mental Status: She is alert and oriented to person, place, and time.  Psychiatric:        Mood and Affect: Mood normal.        Behavior: Behavior normal.        Thought Content: Thought content normal.        Judgment: Judgment normal.        Assessment & Plan:  1. Diabetes mellitus treated with oral medication (HCC) (Primary) - POC HgB  A1c- 6.0  - Will have her start drinking Glucerna before bed  - Continue with Metformin    2. Essential hypertension - Well controlled. No change in medication   Alto Atta, NP

## 2024-02-19 ENCOUNTER — Other Ambulatory Visit: Payer: Self-pay

## 2024-02-20 ENCOUNTER — Other Ambulatory Visit: Payer: Self-pay

## 2024-02-23 ENCOUNTER — Other Ambulatory Visit: Payer: Self-pay

## 2024-03-01 ENCOUNTER — Other Ambulatory Visit: Payer: Self-pay

## 2024-03-01 ENCOUNTER — Other Ambulatory Visit (HOSPITAL_COMMUNITY): Payer: Self-pay

## 2024-03-02 ENCOUNTER — Other Ambulatory Visit (HOSPITAL_COMMUNITY): Payer: Self-pay

## 2024-03-05 ENCOUNTER — Other Ambulatory Visit (HOSPITAL_COMMUNITY): Payer: Self-pay

## 2024-03-05 ENCOUNTER — Other Ambulatory Visit: Payer: Self-pay

## 2024-03-11 ENCOUNTER — Other Ambulatory Visit: Payer: Self-pay

## 2024-03-12 ENCOUNTER — Other Ambulatory Visit (HOSPITAL_COMMUNITY): Payer: Self-pay

## 2024-03-16 ENCOUNTER — Other Ambulatory Visit: Payer: Self-pay

## 2024-03-17 ENCOUNTER — Other Ambulatory Visit: Payer: Self-pay

## 2024-03-18 ENCOUNTER — Other Ambulatory Visit: Payer: Self-pay

## 2024-03-19 ENCOUNTER — Other Ambulatory Visit: Payer: Self-pay

## 2024-03-30 ENCOUNTER — Other Ambulatory Visit (HOSPITAL_COMMUNITY): Payer: Self-pay

## 2024-03-31 ENCOUNTER — Other Ambulatory Visit: Payer: Self-pay

## 2024-04-01 ENCOUNTER — Other Ambulatory Visit (HOSPITAL_COMMUNITY): Payer: Self-pay

## 2024-04-20 ENCOUNTER — Other Ambulatory Visit (HOSPITAL_COMMUNITY): Payer: Self-pay

## 2024-04-22 ENCOUNTER — Other Ambulatory Visit (HOSPITAL_BASED_OUTPATIENT_CLINIC_OR_DEPARTMENT_OTHER): Payer: Self-pay

## 2024-04-22 ENCOUNTER — Other Ambulatory Visit (HOSPITAL_COMMUNITY): Payer: Self-pay

## 2024-04-22 ENCOUNTER — Other Ambulatory Visit: Payer: Self-pay

## 2024-04-26 ENCOUNTER — Other Ambulatory Visit: Payer: Self-pay

## 2024-04-26 ENCOUNTER — Other Ambulatory Visit (HOSPITAL_COMMUNITY): Payer: Self-pay

## 2024-04-27 ENCOUNTER — Other Ambulatory Visit: Payer: Self-pay

## 2024-04-27 ENCOUNTER — Other Ambulatory Visit (HOSPITAL_COMMUNITY): Payer: Self-pay

## 2024-04-28 ENCOUNTER — Other Ambulatory Visit: Payer: Self-pay

## 2024-05-07 ENCOUNTER — Other Ambulatory Visit: Payer: Self-pay

## 2024-05-07 ENCOUNTER — Other Ambulatory Visit: Payer: Self-pay | Admitting: Adult Health

## 2024-05-10 ENCOUNTER — Other Ambulatory Visit (HOSPITAL_COMMUNITY): Payer: Self-pay

## 2024-05-10 ENCOUNTER — Other Ambulatory Visit: Payer: Self-pay

## 2024-05-10 MED ORDER — LEVOTHYROXINE SODIUM 50 MCG PO TABS
50.0000 ug | ORAL_TABLET | Freq: Every morning | ORAL | 0 refills | Status: DC
  Filled 2024-05-10: qty 90, 90d supply, fill #0
  Filled 2024-05-18: qty 30, 30d supply, fill #0
  Filled 2024-05-20 (×2): qty 90, 90d supply, fill #0

## 2024-05-11 ENCOUNTER — Other Ambulatory Visit: Payer: Self-pay

## 2024-05-11 ENCOUNTER — Other Ambulatory Visit (HOSPITAL_COMMUNITY): Payer: Self-pay

## 2024-05-11 MED ORDER — POTASSIUM CHLORIDE ER 10 MEQ PO TBCR
10.0000 meq | EXTENDED_RELEASE_TABLET | Freq: Every evening | ORAL | 3 refills | Status: AC
  Filled 2024-05-11: qty 90, 90d supply, fill #0
  Filled 2024-05-18: qty 30, 30d supply, fill #0
  Filled 2024-06-15: qty 30, 30d supply, fill #1
  Filled 2024-07-15 – 2024-07-20 (×2): qty 30, 30d supply, fill #2
  Filled 2024-08-12: qty 30, 30d supply, fill #3
  Filled 2024-09-08 – 2024-09-14 (×2): qty 30, 30d supply, fill #4
  Filled 2024-10-17 – 2024-10-18 (×2): qty 30, 30d supply, fill #5
  Filled 2024-11-16 – 2024-11-17 (×2): qty 30, 30d supply, fill #6

## 2024-05-13 ENCOUNTER — Other Ambulatory Visit: Payer: Self-pay

## 2024-05-13 ENCOUNTER — Other Ambulatory Visit (HOSPITAL_COMMUNITY): Payer: Self-pay

## 2024-05-14 ENCOUNTER — Other Ambulatory Visit: Payer: Self-pay

## 2024-05-18 ENCOUNTER — Other Ambulatory Visit: Payer: Self-pay

## 2024-05-18 ENCOUNTER — Other Ambulatory Visit (HOSPITAL_COMMUNITY): Payer: Self-pay

## 2024-05-19 ENCOUNTER — Other Ambulatory Visit (HOSPITAL_COMMUNITY): Payer: Self-pay

## 2024-05-19 ENCOUNTER — Other Ambulatory Visit: Payer: Self-pay

## 2024-05-20 ENCOUNTER — Other Ambulatory Visit (HOSPITAL_COMMUNITY): Payer: Self-pay

## 2024-05-20 ENCOUNTER — Ambulatory Visit (INDEPENDENT_AMBULATORY_CARE_PROVIDER_SITE_OTHER): Admitting: Adult Health

## 2024-05-20 ENCOUNTER — Encounter: Payer: Self-pay | Admitting: Adult Health

## 2024-05-20 ENCOUNTER — Other Ambulatory Visit: Payer: Self-pay

## 2024-05-20 VITALS — BP 120/60 | HR 72 | Temp 98.5°F | Wt 193.0 lb

## 2024-05-20 DIAGNOSIS — Z7984 Long term (current) use of oral hypoglycemic drugs: Secondary | ICD-10-CM | POA: Diagnosis not present

## 2024-05-20 DIAGNOSIS — F32A Depression, unspecified: Secondary | ICD-10-CM | POA: Diagnosis not present

## 2024-05-20 DIAGNOSIS — E785 Hyperlipidemia, unspecified: Secondary | ICD-10-CM | POA: Diagnosis not present

## 2024-05-20 DIAGNOSIS — J4520 Mild intermittent asthma, uncomplicated: Secondary | ICD-10-CM

## 2024-05-20 DIAGNOSIS — I10 Essential (primary) hypertension: Secondary | ICD-10-CM

## 2024-05-20 DIAGNOSIS — Z0000 Encounter for general adult medical examination without abnormal findings: Secondary | ICD-10-CM | POA: Diagnosis not present

## 2024-05-20 DIAGNOSIS — E119 Type 2 diabetes mellitus without complications: Secondary | ICD-10-CM | POA: Diagnosis not present

## 2024-05-20 DIAGNOSIS — F419 Anxiety disorder, unspecified: Secondary | ICD-10-CM

## 2024-05-20 DIAGNOSIS — E039 Hypothyroidism, unspecified: Secondary | ICD-10-CM

## 2024-05-20 DIAGNOSIS — N951 Menopausal and female climacteric states: Secondary | ICD-10-CM | POA: Diagnosis not present

## 2024-05-20 LAB — HEMOGLOBIN A1C: Hgb A1c MFr Bld: 6.8 % — ABNORMAL HIGH (ref 4.6–6.5)

## 2024-05-20 LAB — LIPID PANEL
Cholesterol: 143 mg/dL (ref 0–200)
HDL: 58.3 mg/dL (ref >39.00)
LDL Cholesterol: 61 mg/dL (ref 0–99)
NonHDL: 84.62
Total CHOL/HDL Ratio: 2
Triglycerides: 119 mg/dL (ref 0.0–149.0)
VLDL: 23.8 mg/dL (ref 0.0–40.0)

## 2024-05-20 LAB — COMPREHENSIVE METABOLIC PANEL WITH GFR
ALT: 20 U/L (ref 0–35)
AST: 19 U/L (ref 0–37)
Albumin: 4.4 g/dL (ref 3.5–5.2)
Alkaline Phosphatase: 85 U/L (ref 39–117)
BUN: 19 mg/dL (ref 6–23)
CO2: 27 meq/L (ref 19–32)
Calcium: 10 mg/dL (ref 8.4–10.5)
Chloride: 101 meq/L (ref 96–112)
Creatinine, Ser: 0.96 mg/dL (ref 0.40–1.20)
GFR: 58.76 mL/min — ABNORMAL LOW (ref >60.00)
Glucose, Bld: 105 mg/dL — ABNORMAL HIGH (ref 70–99)
Potassium: 3.9 meq/L (ref 3.5–5.1)
Sodium: 139 meq/L (ref 135–145)
Total Bilirubin: 0.6 mg/dL (ref 0.2–1.2)
Total Protein: 7.6 g/dL (ref 6.0–8.3)

## 2024-05-20 LAB — CBC WITH DIFFERENTIAL/PLATELET
Basophils Absolute: 0.1 K/uL (ref 0.0–0.1)
Basophils Relative: 0.6 % (ref 0.0–3.0)
Eosinophils Absolute: 0.2 K/uL (ref 0.0–0.7)
Eosinophils Relative: 1.5 % (ref 0.0–5.0)
HCT: 38.2 % (ref 36.0–46.0)
Hemoglobin: 12.4 g/dL (ref 12.0–15.0)
Lymphocytes Relative: 12 % (ref 12.0–46.0)
Lymphs Abs: 1.3 K/uL (ref 0.7–4.0)
MCHC: 32.4 g/dL (ref 30.0–36.0)
MCV: 88.4 fl (ref 78.0–100.0)
Monocytes Absolute: 1.7 K/uL — ABNORMAL HIGH (ref 0.1–1.0)
Monocytes Relative: 15.3 % — ABNORMAL HIGH (ref 3.0–12.0)
Neutro Abs: 7.8 K/uL — ABNORMAL HIGH (ref 1.4–7.7)
Neutrophils Relative %: 70.6 % (ref 43.0–77.0)
Platelets: 229 K/uL (ref 150.0–400.0)
RBC: 4.32 Mil/uL (ref 3.87–5.11)
RDW: 14.7 % (ref 11.5–15.5)
WBC: 11.1 K/uL — ABNORMAL HIGH (ref 4.0–10.5)

## 2024-05-20 LAB — MICROALBUMIN / CREATININE URINE RATIO
Creatinine,U: 204.1 mg/dL
Microalb Creat Ratio: 4.5 mg/g (ref 0.0–30.0)
Microalb, Ur: 0.9 mg/dL (ref 0.0–1.9)

## 2024-05-20 MED ORDER — FREESTYLE LIBRE 3 READER DEVI
0 refills | Status: DC
  Filled 2024-05-20: qty 1, 30d supply, fill #0

## 2024-05-20 NOTE — Patient Instructions (Signed)
 It was great seeing you today   We will follow up with you regarding your lab work   Please let me know if you need anything

## 2024-05-20 NOTE — Progress Notes (Signed)
 Subjective:    Patient ID: Amber Brewer, female    DOB: 11/29/1950, 73 y.o.   MRN: [id number]  HPI  Patient presents for yearly preventative medicine examination. She is a pleasant 73 year old female who  has a past medical history of Allergy, Anemia, Asthma, Bowen's disease, Chicken pox, DDD (degenerative disc disease), cervical, Depression, Diabetes mellitus without complication (HCC), Gallstones, GERD (gastroesophageal reflux disease), melanoma in situ (2002), UTI (urinary tract infection), Hyperlipidemia, Hypertension, Hypothyroidism, IBS (irritable bowel syndrome), Melanoma (HCC), Nephrolithiasis, Osteoarthritis, PVC's (premature ventricular contractions), and Status post dilation of esophageal narrowing.  DM Type 2 -formally diagnosed in July 2023 at her CPE when her A1c was 6.9.  At this time she was on metformin  250 daily for prediabetes.  This was changed to 500 mg twice daily.  She has been tolerating this medication well and has been working on lifestyle modifications.  She continues to use the libre CGM.  Lab Results  Component Value Date   HGBA1C 6.0 (A) 02/18/2024   HGBA1C 5.9 (A) 08/20/2023   HGBA1C 6.3 05/20/2023   CONTINUOUS GLUCOSE MONITORING RECORD INTERPRETATION                 Dates of Recording:    Sensor description: freestyle libre        Average and SD 108  Time in range 97%  % Time Above 180 1%  % Time above 250 1%  % Time Below target 0%    Hypertension-managed with Norvasc  5 mg daily, Coreg  25 mg twice daily, HCTZ 25 mg daily, and Avapro  150 mg daily.  He does take a potassium supplement of 10 meq .  She checks her blood pressures at home with readings mostly in the 120s over 70s BP Readings from Last 3 Encounters:  05/20/24 120/60  02/18/24 (!) 110/58  12/16/23 (!) 102/58   Hyperlipidemia -managed with Crestor  10 mg daily.  She denies myalgia or fatigue Lab Results  Component Value Date   CHOL 153 05/20/2023   HDL 58.70 05/20/2023   LDLCALC 76  05/20/2023   LDLDIRECT 143.5 01/15/2011   TRIG 91.0 05/20/2023   CHOLHDL 3 05/20/2023   Hypothyroidism - managed with Synthroid  50 mcg daily.  Lab Results  Component Value Date   TSH 4.90 05/20/2023   Asthma-managed Trelegy Ellipta  200 mcg 1 puff daily and albuterol  as needed.  She does feel well controlled.  Anxiety and Depression -managed with citalopram  5 mg daily.  She does feel controlled for the most part but is having difficulty with her special needs son.    Postmenopausal estrogen deficiency-uses estrogen patch 0.5 mg 24 hours twice a week  All immunizations and health maintenance protocols were reviewed with the patient and needed orders were placed.Advised to get shingles vaccinations   Appropriate screening laboratory values were ordered for the patient including screening of hyperlipidemia, renal function and hepatic function.   Medication reconciliation,  past medical history, social history, problem list and allergies were reviewed in detail with the patient  Goals were established with regard to weight loss, exercise, and  diet in compliance with medications  Wt Readings from Last 3 Encounters:  05/20/24 193 lb (87.5 kg)  02/18/24 195 lb (88.5 kg)  12/16/23 193 lb 1.6 oz (87.6 kg)   She is up to date on routine colon cancer screening,  mammogram, and bone density screening   Review of Systems  Constitutional: Negative.   HENT: Negative.    Eyes: Negative.  Respiratory: Negative.    Cardiovascular: Negative.   Gastrointestinal: Negative.   Endocrine: Negative.   Genitourinary: Negative.   Musculoskeletal: Negative.   Skin: Negative.   Allergic/Immunologic: Negative.   Neurological: Negative.   Hematological: Negative.   Psychiatric/Behavioral: Negative.     Past Medical History:  Diagnosis Date   Allergy    Anemia    age 73 only   Asthma    Bowen's disease    ~ 17 yrs ago   Chicken pox    DDD (degenerative disc disease), cervical     Depression    Diabetes mellitus without complication (HCC)    on metformin  - goes back andf forth   Gallstones    GERD (gastroesophageal reflux disease)    Hx of melanoma in situ 2002   Hx: UTI (urinary tract infection)    Hyperlipidemia    Hypertension    Hypothyroidism    IBS (irritable bowel syndrome)    with D   Melanoma (HCC)    x2    Nephrolithiasis    Osteoarthritis    PVC's (premature ventricular contractions)    Status post dilation of esophageal narrowing     Social History   Socioeconomic History   Marital status: Married    Spouse name: Not on file   Number of children: 2   Years of education: 13   Highest education level: Not on file  Occupational History   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  Tobacco Use   Smoking status: Never   Smokeless tobacco: Never  Vaping Use   Vaping status: Never Used  Substance and Sexual Activity   Alcohol use: No   Drug use: No   Sexual activity: Yes    Birth control/protection: Post-menopausal, Surgical  Other Topics Concern   Not on file  Social History Narrative   epworth sleepiness scale = 8 (01/18/2016)   Lives at home with her husband and disabled son   Right handed   Caffeine: decaf   Social Drivers of Health   Financial Resource Strain: Medium Risk (05/01/2023)   Overall Financial Resource Strain (CARDIA)    Difficulty of Paying Living Expenses: Somewhat hard  Food Insecurity: Food Insecurity Present (05/01/2023)   Hunger Vital Sign    Worried About Running Out of Food in the Last Year: Sometimes true    Ran Out of Food in the Last Year: Never true  Transportation Needs: No Transportation Needs (05/01/2023)   PRAPARE - Administrator, Civil Service (Medical): No    Lack of Transportation (Non-Medical): No  Physical Activity: Insufficiently Active (05/01/2023)   Exercise Vital Sign    Days of Exercise per Week: 4 days    Minutes of Exercise per  Session: 30 min  Stress: Stress Concern Present (05/01/2023)   [employer] of Occupational Health - Occupational Stress Questionnaire    Feeling of Stress : To some extent  Social Connections: Moderately Integrated (05/01/2023)   Social Connection and Isolation Panel    Frequency of Communication with Friends and Family: More than three times a week    Frequency of Social Gatherings with Friends and Family: Once a week    Attends Religious Services: More than 4 times per year    Active Member of [employer] or Organizations: No    Attends Banker Meetings: Never    Marital Status: Married  Catering manager Violence: Not At Risk (05/01/2023)   Humiliation, Afraid, Rape,  and Kick questionnaire    Fear of Current or Ex-Partner: No    Emotionally Abused: No    Physically Abused: No    Sexually Abused: No    Past Surgical History:  Procedure Laterality Date   ABDOMINAL HYSTERECTOMY  1974   APPENDECTOMY  1977   BLADDER SURGERY  1974   BREAST EXCISIONAL BIOPSY     CATARACT EXTRACTION Bilateral 2015   CHOLECYSTECTOMY  1987   COLONOSCOPY     RECTAL SURGERY     ~17 yrs ago- Bowens dx per pt with surgery   removal of melanoma in situ      Family History  Problem Relation Age of Onset   Hypertension Mother    Thyroid  disease Mother    Arthritis Mother    Depression Mother    Hearing loss Mother    Miscarriages / Stillbirths Mother    Heart attack Father    Heart disease Father    Alcohol abuse Father    Arthritis Father    Depression Father    Drug abuse Father    Hypertension Father    Alcohol abuse Sister    Arthritis Sister    Depression Sister    Hearing loss Sister    Hypotension Sister    Depression Sister    Heart disease Sister    Hyperlipidemia Sister    Hypertension Maternal Aunt    Hypertension Maternal Uncle    Clotting disorder Maternal Uncle    Prostate cancer Maternal Uncle    Colon cancer Maternal Uncle    Colon polyps Maternal Uncle     Diabetes Maternal Uncle        all uncles   Arthritis Maternal Grandmother    Asthma Maternal Grandmother    Breast cancer Maternal Grandmother    Stomach cancer Maternal Grandmother    Hearing loss Maternal Grandmother    Diabetes Maternal Grandfather    Heart disease Maternal Grandfather    Arthritis Maternal Grandfather    Hearing loss Maternal Grandfather    Hypertension Maternal Grandfather    Kidney disease Maternal Grandfather    Hyperlipidemia Paternal Grandmother        also MI   Breast cancer Paternal Grandmother    Arthritis Paternal Grandmother    Heart attack Paternal Grandmother    Hyperlipidemia Paternal Grandfather        also MI   Heart disease Paternal Grandfather    Heart attack Paternal Grandfather    Endometriosis Daughter    Hypertension Son    Esophageal cancer Neg Hx    Rectal cancer Neg Hx     Allergies  Allergen Reactions   Prednisone Shortness Of Breath   Betadine [Povidone Iodine] Hives    After surgical skin prep   Codeine Phosphate Nausea And Vomiting   Iodine Rash    Broke out on skin once years ago--**Betadine Skin Prep**   Sulfa Antibiotics Nausea And Vomiting    Current Outpatient Medications on File Prior to Visit  Medication Sig Dispense Refill   albuterol  (VENTOLIN  HFA) 108 (90 Base) MCG/ACT inhaler Inhale 2 puffs into the lungs every 4 (four) hours as needed for wheezing or shortness of breath. 6.7 g 1   amLODipine  (NORVASC ) 5 MG tablet Take 1 tablet (5 mg total) by mouth daily. 90 tablet 3   blood glucose meter kit and supplies Dispense based on patient and insurance preference. Use up to four times daily as directed. (FOR ICD-10 E10.9, E11.9). 1 each 0  budesonide (RHINOCORT AQUA) 32 MCG/ACT nasal spray Place 1 spray into both nostrils daily as needed for allergies.      calcium  carbonate (OSCAL) 1500 (600 Ca) MG TABS tablet Take by mouth 2 (two) times daily with a meal.     carvedilol  (COREG ) 25 MG tablet Take 1 tablet (25 mg  total) by mouth 2 (two) times daily. 180 tablet 1   cetirizine  (ZYRTEC ) 10 MG tablet Take 1 tablet (10 mg total) by mouth daily as needed (Can take an extra dose during flare ups.). 180 tablet 1   Cholecalciferol  (VITAMIN D) 50 MCG (2000 UT) tablet Take 2,000 Units by mouth daily.     Cholecalciferol  (VITAMIN D3) 50 MCG (2000 UT) capsule Take 1 capsule (2,000 Units total) by mouth daily. 90 capsule 3   citalopram  (CELEXA ) 10 MG tablet Take 1 tablet (10 mg total) by mouth every morning. 90 tablet 1   Continuous Glucose Receiver (FREESTYLE LIBRE 3 READER) DEVI Use with libre sensor 1 each 0   Continuous Glucose Sensor (FREESTYLE LIBRE 3 PLUS SENSOR) MISC Change sensor every 15 days. 6 each 3   cycloSPORINE (RESTASIS) 0.05 % ophthalmic emulsion Place 1 drop into both eyes 2 (two) times daily.     estradiol  (VIVELLE -DOT) 0.05 MG/24HR patch Place 1 patch (0.05 mg total) onto the skin 2 (two) times a week. 8 patch 10   famotidine  (PEPCID ) 40 MG tablet Take 1 tablet (40 mg total) by mouth daily before breakfast. 90 tablet 3   Fluticasone -Umeclidin-Vilant (TRELEGY ELLIPTA ) 200-62.5-25 MCG/ACT AEPB Inhale 1 puff into the lungs every other day. 84 each 3   hydrochlorothiazide  (HYDRODIURIL ) 25 MG tablet Take 1 tablet (25 mg total) by mouth every morning. 90 tablet 3   irbesartan  (AVAPRO ) 150 MG tablet Take 1 tablet (150 mg total) by mouth in the morning and at bedtime. 180 tablet 3   Lancets (ONETOUCH DELICA PLUS LANCET33G) MISC USE TO check blood glucose UP TO four times daily AS DIRECTED 100 each 0   levothyroxine  (SYNTHROID ) 50 MCG tablet Take 1 tablet (50 mcg total) by mouth every morning. 90 tablet 0   loratadine (CLARITIN) 5 MG chewable tablet Chew 5 mg by mouth daily.     metFORMIN  (GLUCOPHAGE ) 500 MG tablet Take 1 tablet (500 mg total) by mouth 2 (two) times daily with a meal. 180 tablet 11   metroNIDAZOLE  (METROGEL ) 0.75 % gel Apply 1 Application topically 2 (two) times daily. 45 g 0   minoxidil  (ROGAINE) 2 % external solution Apply topically daily.     mupirocin  cream (BACTROBAN ) 2 % Apply 1 Application topically 2 (two) times daily. 15 g 0   ONETOUCH ULTRA test strip USE TO check blood glucose UP TO four times daily AS DIRECTED 100 strip 0   Oyster Shell 500 MG TABS Take 1 tablet (500 mg total) by mouth 2 (two) times daily. 180 tablet 3   potassium chloride  (KLOR-CON ) 10 MEQ tablet Take 1 tablet (10 mEq total) by mouth every evening. 90 tablet 3   rosuvastatin  (CRESTOR ) 10 MG tablet Take 1 tablet (10 mg total) by mouth every other evening. 90 tablet 2   triamcinolone  (NASACORT ) 55 MCG/ACT AERO nasal inhaler Place 1 spray into the nose 2 (two) times daily. 50.7 mL 3   Turmeric 500 MG CAPS SMARTSIG:1 Capsule(s) By Mouth Every Evening     No current facility-administered medications on file prior to visit.    BP 120/60   Pulse 72   Temp 98.5  F (36.9 C)   Wt 193 lb (87.5 kg)   SpO2 98%   BMI 34.19 kg/m       Objective:   Physical Exam Vitals and nursing note reviewed.  Constitutional:      General: She is not in acute distress.    Appearance: Normal appearance. She is not ill-appearing.  HENT:     Head: Normocephalic and atraumatic.     Right Ear: Tympanic membrane, ear canal and external ear normal. There is no impacted cerumen.     Left Ear: Tympanic membrane, ear canal and external ear normal. There is no impacted cerumen.     Nose: Nose normal. No congestion or rhinorrhea.     Mouth/Throat:     Mouth: Mucous membranes are moist.     Pharynx: Oropharynx is clear.  Eyes:     Extraocular Movements: Extraocular movements intact.     Conjunctiva/sclera: Conjunctivae normal.     Pupils: Pupils are equal, round, and reactive to light.  Neck:     Vascular: No carotid bruit.  Cardiovascular:     Rate and Rhythm: Normal rate and regular rhythm.     Pulses: Normal pulses.     Heart sounds: No murmur heard.    No friction rub. No gallop.  Pulmonary:     Effort:  Pulmonary effort is normal.     Breath sounds: Normal breath sounds.  Abdominal:     General: Abdomen is flat. Bowel sounds are normal. There is no distension.     Palpations: Abdomen is soft. There is no mass.     Tenderness: There is no abdominal tenderness. There is no guarding or rebound.     Hernia: No hernia is present.  Musculoskeletal:        General: Normal range of motion.     Cervical back: Normal range of motion and neck supple.  Lymphadenopathy:     Cervical: No cervical adenopathy.  Skin:    General: Skin is warm and dry.     Capillary Refill: Capillary refill takes less than 2 seconds.  Neurological:     General: No focal deficit present.     Mental Status: She is alert and oriented to person, place, and time.  Psychiatric:        Mood and Affect: Mood normal.        Behavior: Behavior normal.        Thought Content: Thought content normal.        Judgment: Judgment normal.         Assessment & Plan:   1. Routine general medical examination at a health care facility (Primary) Today patient counseled on age appropriate routine health concerns for screening and prevention, each reviewed and up to date or declined. Immunizations reviewed and up to date or declined. Labs ordered and reviewed. Risk factors for depression reviewed and negative. Hearing function and visual acuity are intact. ADLs screened and addressed as needed. Functional ability and level of safety reviewed and appropriate. Education, counseling and referrals performed based on assessed risks today. Patient provided with a copy of personalized plan for preventive services. - Follow up in one year or sooner if needed  2. Diabetes mellitus treated with oral medication (HCC) - Continue with metformin   - 3-6 month follow up depending on A1c - CBC with Differential/Platelet; Future - Comprehensive metabolic panel with GFR; Future - Lipid panel; Future - TSH; Future - Hemoglobin A1c; Future -  Microalbumin/Creatinine Ratio, Urine; Future - Continuous Glucose  Receiver (FREESTYLE LIBRE 3 READER) DEVI; Use with libre sensor  Dispense: 1 each; Refill: 0  3. Essential hypertension - Well controlled. No change in medication  - CBC with Differential/Platelet; Future - Comprehensive metabolic panel with GFR; Future - Lipid panel; Future - TSH; Future  4. Hyperlipidemia, unspecified hyperlipidemia type - Consider increase in statin  - CBC with Differential/Platelet; Future - Comprehensive metabolic panel with GFR; Future - Lipid panel; Future - TSH; Future  5. Hypothyroidism, unspecified type - Consider hypothyroidism  - CBC with Differential/Platelet; Future - Comprehensive metabolic panel with GFR; Future - Lipid panel; Future - TSH; Future  6. Mild intermittent asthma without complication - Continue with inhalers  - CBC with Differential/Platelet; Future - Comprehensive metabolic panel with GFR; Future - Lipid panel; Future - TSH; Future  7. Anxiety and depression - Continue with Celexa  5 mg daily  - CBC with Differential/Platelet; Future - Comprehensive metabolic panel with GFR; Future - Lipid panel; Future - TSH; Future  8. Post menopausal syndrome - Continue with estrogen replacement  - CBC with Differential/Platelet; Future - Comprehensive metabolic panel with GFR; Future - Lipid panel; Future - TSH; Future   Darleene Shape, NP

## 2024-05-21 ENCOUNTER — Other Ambulatory Visit: Payer: Self-pay

## 2024-05-21 ENCOUNTER — Ambulatory Visit: Payer: Self-pay | Admitting: Adult Health

## 2024-05-21 LAB — TSH: TSH: 3.52 u[IU]/mL (ref 0.35–5.50)

## 2024-05-24 ENCOUNTER — Other Ambulatory Visit (HOSPITAL_COMMUNITY): Payer: Self-pay

## 2024-05-24 ENCOUNTER — Other Ambulatory Visit: Payer: Self-pay | Admitting: Allergy and Immunology

## 2024-05-24 ENCOUNTER — Other Ambulatory Visit: Payer: Self-pay

## 2024-05-24 MED ORDER — TRELEGY ELLIPTA 200-62.5-25 MCG/ACT IN AEPB
1.0000 | INHALATION_SPRAY | RESPIRATORY_TRACT | 3 refills | Status: DC
  Filled 2024-05-24: qty 120, 60d supply, fill #0
  Filled 2024-07-15 – 2024-07-20 (×2): qty 120, 60d supply, fill #1
  Filled 2024-09-23: qty 60, 30d supply, fill #2
  Filled 2024-10-17 – 2024-10-18 (×2): qty 60, 30d supply, fill #3

## 2024-05-28 ENCOUNTER — Other Ambulatory Visit: Payer: Self-pay | Admitting: Adult Health

## 2024-05-28 DIAGNOSIS — Z1231 Encounter for screening mammogram for malignant neoplasm of breast: Secondary | ICD-10-CM

## 2024-06-07 ENCOUNTER — Other Ambulatory Visit: Payer: Self-pay | Admitting: Adult Health

## 2024-06-07 ENCOUNTER — Other Ambulatory Visit: Payer: Self-pay

## 2024-06-07 ENCOUNTER — Other Ambulatory Visit: Payer: Self-pay | Admitting: Allergy and Immunology

## 2024-06-07 ENCOUNTER — Other Ambulatory Visit (HOSPITAL_COMMUNITY): Payer: Self-pay

## 2024-06-07 DIAGNOSIS — E119 Type 2 diabetes mellitus without complications: Secondary | ICD-10-CM

## 2024-06-07 MED ORDER — CETIRIZINE HCL 10 MG PO TABS
10.0000 mg | ORAL_TABLET | Freq: Every day | ORAL | 1 refills | Status: DC | PRN
Start: 2024-06-07 — End: 2024-09-17
  Filled 2024-06-15: qty 60, 30d supply, fill #0
  Filled 2024-07-15 – 2024-07-20 (×2): qty 60, 30d supply, fill #1
  Filled 2024-08-12: qty 60, 30d supply, fill #2

## 2024-06-09 ENCOUNTER — Ambulatory Visit (INDEPENDENT_AMBULATORY_CARE_PROVIDER_SITE_OTHER)

## 2024-06-09 ENCOUNTER — Other Ambulatory Visit (HOSPITAL_COMMUNITY): Payer: Self-pay

## 2024-06-09 VITALS — BP 120/60 | HR 65 | Temp 98.0°F | Ht 63.0 in | Wt 197.7 lb

## 2024-06-09 DIAGNOSIS — Z0000 Encounter for general adult medical examination without abnormal findings: Secondary | ICD-10-CM | POA: Diagnosis not present

## 2024-06-09 MED ORDER — FREESTYLE LIBRE 3 READER DEVI
0 refills | Status: AC
  Filled 2024-06-09: qty 1, 30d supply, fill #0

## 2024-06-09 NOTE — Patient Instructions (Addendum)
 Amber Brewer , Thank you for taking time out of your busy schedule to complete your Annual Wellness Visit with me. I enjoyed our conversation and look forward to speaking with you again next year. I, as well as your care team,  appreciate your ongoing commitment to your health goals. Please review the following plan we discussed and let me know if I can assist you in the future. Your Game plan/ To Do List    Referrals: If you haven't heard from the office you've been referred to, please reach out to them at the phone provided.   Follow up Visits: We will see or speak with you next year for your Next Medicare AWV with our clinical staff 06/15/25 @ 10A Have you seen your provider in the last 6 months (3 months if uncontrolled diabetes)?   Clinician Recommendations:  Aim for 30 minutes of exercise or brisk walking, 6-8 glasses of water, and 5 servings of fruits and vegetables each day.       This is a list of the screenings recommended for you:  Health Maintenance  Topic Date Due   Zoster (Shingles) Vaccine (1 of 2) Never done   Mammogram  05/05/2024   Flu Shot  05/21/2024   DTaP/Tdap/Td vaccine (3 - Td or Tdap) 10/29/2024*   Cologuard (Stool DNA test)  01/17/2025   Yearly kidney function blood test for diabetes  05/20/2025   Yearly kidney health urinalysis for diabetes  05/20/2025   Medicare Annual Wellness Visit  06/09/2025   Pneumococcal Vaccine for age over 53  Completed   DEXA scan (bone density measurement)  Completed   Hepatitis C Screening  Completed   HPV Vaccine  Aged Out   Meningitis B Vaccine  Aged Out   COVID-19 Vaccine  Discontinued  *Topic was postponed. The date shown is not the original due date.    Advanced directives: (In Chart) A copy of your advanced directives are scanned into your chart should your provider ever need it. Advance Care Planning is important because it:  [x]  Makes sure you receive the medical care that is consistent with your values, goals, and  preferences  [x]  It provides guidance to your family and loved ones and reduces their decisional burden about whether or not they are making the right decisions based on your wishes.  Follow the link provided in your after visit summary or read over the paperwork we have mailed to you to help you started getting your Advance Directives in place. If you need assistance in completing these, please reach out to us  so that we can help you!  See attachments for Preventive Care and Fall Prevention Tips.

## 2024-06-09 NOTE — Progress Notes (Signed)
 Subjective:   Amber Brewer is a 73 y.o. who presents for a Medicare Wellness preventive visit.  As a reminder, Annual Wellness Visits don't include a physical exam, and some assessments may be limited, especially if this visit is performed virtually. We may recommend an in-person follow-up visit with your provider if needed.  Visit Complete: In person    Persons Participating in Visit: Patient.  AWV Questionnaire: No: Patient Medicare AWV questionnaire was not completed prior to this visit.  Cardiac Risk Factors include: advanced age (>57men, >39 women);diabetes mellitus;hypertension     Objective:    Today's Vitals   06/09/24 0950  BP: 120/60  Pulse: 65  Temp: 98 F (36.7 C)  TempSrc: Oral  SpO2: 97%  Weight: 197 lb 11.2 oz (89.7 kg)  Height: 5' 3 (1.6 m)   Body mass index is 35.02 kg/m.     06/09/2024   10:14 AM 05/26/2023    3:46 PM 04/09/2022   10:07 AM 04/02/2021   10:41 AM 05/06/2017    6:54 PM 04/18/2017    8:51 AM  Advanced Directives  Does Patient Have a Medical Advance Directive? Yes Yes Yes Yes No  No   Type of Estate agent of [place];Living will Living will Healthcare Power of [place];Living will Healthcare Power of [place];Living will    Does patient want to make changes to medical advance directive? No - Patient declined No - Patient declined No - Patient declined     Copy of Healthcare Power of Attorney in Chart? No - copy requested  Yes - validated most recent copy scanned in chart (See row information) No - copy requested    Would patient like information on creating a medical advance directive?     No - Patient declined       Data saved with a previous flowsheet row definition    Current Medications (verified) Outpatient Encounter Medications as of 06/09/2024  Medication Sig   albuterol  (VENTOLIN  HFA) 108 (90 Base) MCG/ACT inhaler Inhale 2 puffs into the lungs every 4 (four) hours as needed for wheezing or shortness of  breath.   amLODipine  (NORVASC ) 5 MG tablet Take 1 tablet (5 mg total) by mouth daily.   blood glucose meter kit and supplies Dispense based on patient and insurance preference. Use up to four times daily as directed. (FOR ICD-10 E10.9, E11.9).   budesonide (RHINOCORT AQUA) 32 MCG/ACT nasal spray Place 1 spray into both nostrils daily as needed for allergies.    calcium  carbonate (OSCAL) 1500 (600 Ca) MG TABS tablet Take by mouth 2 (two) times daily with a meal.   carvedilol  (COREG ) 25 MG tablet Take 1 tablet (25 mg total) by mouth 2 (two) times daily.   cetirizine  (ZYRTEC ) 10 MG tablet Take 1 tablet (10 mg total) by mouth daily as needed (Can take an extra dose during flare ups.).   Cholecalciferol  (VITAMIN D) 50 MCG (2000 UT) tablet Take 2,000 Units by mouth daily.   Cholecalciferol  (VITAMIN D3) 50 MCG (2000 UT) capsule Take 1 capsule (2,000 Units total) by mouth daily.   citalopram  (CELEXA ) 10 MG tablet Take 1 tablet (10 mg total) by mouth every morning.   Continuous Glucose Receiver (FREESTYLE LIBRE 3 READER) DEVI Use with libre sensor   Continuous Glucose Sensor (FREESTYLE LIBRE 3 PLUS SENSOR) MISC Change sensor every 15 days.   cycloSPORINE (RESTASIS) 0.05 % ophthalmic emulsion Place 1 drop into both eyes 2 (two) times daily.   estradiol  (VIVELLE -DOT) 0.05 MG/24HR  patch Place 1 patch (0.05 mg total) onto the skin 2 (two) times a week.   famotidine  (PEPCID ) 40 MG tablet Take 1 tablet (40 mg total) by mouth daily before breakfast.   Fluticasone -Umeclidin-Vilant (TRELEGY ELLIPTA ) 200-62.5-25 MCG/ACT AEPB Inhale 1 puff into the lungs every other day.   hydrochlorothiazide  (HYDRODIURIL ) 25 MG tablet Take 1 tablet (25 mg total) by mouth every morning.   irbesartan  (AVAPRO ) 150 MG tablet Take 1 tablet (150 mg total) by mouth in the morning and at bedtime.   Lancets (ONETOUCH DELICA PLUS LANCET33G) MISC USE TO check blood glucose UP TO four times daily AS DIRECTED   levothyroxine  (SYNTHROID ) 50 MCG  tablet Take 1 tablet (50 mcg total) by mouth every morning.   loratadine (CLARITIN) 5 MG chewable tablet Chew 5 mg by mouth daily.   metFORMIN  (GLUCOPHAGE ) 500 MG tablet Take 1 tablet (500 mg total) by mouth 2 (two) times daily with a meal.   metroNIDAZOLE  (METROGEL ) 0.75 % gel Apply 1 Application topically 2 (two) times daily.   minoxidil (ROGAINE) 2 % external solution Apply topically daily.   ONETOUCH ULTRA test strip USE TO check blood glucose UP TO four times daily AS DIRECTED   Oyster Shell 500 MG TABS Take 1 tablet (500 mg total) by mouth 2 (two) times daily.   potassium chloride  (KLOR-CON ) 10 MEQ tablet Take 1 tablet (10 mEq total) by mouth every evening.   rosuvastatin  (CRESTOR ) 10 MG tablet Take 1 tablet (10 mg total) by mouth every other evening.   triamcinolone  (NASACORT ) 55 MCG/ACT AERO nasal inhaler Place 1 spray into the nose 2 (two) times daily.   Turmeric 500 MG CAPS SMARTSIG:1 Capsule(s) By Mouth Every Evening   [DISCONTINUED] Continuous Glucose Receiver (FREESTYLE LIBRE 3 READER) DEVI Use with libre sensor   No facility-administered encounter medications on file as of 06/09/2024.    Allergies (verified) Prednisone, Betadine [povidone iodine], Codeine phosphate, Iodine, and Sulfa antibiotics   History: Past Medical History:  Diagnosis Date   Allergy    Anemia    age 9 only   Asthma    Bowen's disease    ~ 17 yrs ago   Chicken pox    DDD (degenerative disc disease), cervical    Depression    Diabetes mellitus without complication (HCC)    on metformin  - goes back andf forth   Gallstones    GERD (gastroesophageal reflux disease)    Hx of melanoma in situ 2002   Hx: UTI (urinary tract infection)    Hyperlipidemia    Hypertension    Hypothyroidism    IBS (irritable bowel syndrome)    with D   Melanoma (HCC)    x2    Nephrolithiasis    Osteoarthritis    PVC's (premature ventricular contractions)    Status post dilation of esophageal narrowing    Past  Surgical History:  Procedure Laterality Date   ABDOMINAL HYSTERECTOMY  1974   APPENDECTOMY  1977   BLADDER SURGERY  1974   BREAST EXCISIONAL BIOPSY     CATARACT EXTRACTION Bilateral 2015   CHOLECYSTECTOMY  1987   COLONOSCOPY     RECTAL SURGERY     ~17 yrs ago- Bowens dx per pt with surgery   removal of melanoma in situ     Family History  Problem Relation Age of Onset   Hypertension Mother    Thyroid  disease Mother    Arthritis Mother    Depression Mother    Hearing loss Mother  Miscarriages / Stillbirths Mother    Heart attack Father    Heart disease Father    Alcohol abuse Father    Arthritis Father    Depression Father    Drug abuse Father    Hypertension Father    Alcohol abuse Sister    Arthritis Sister    Depression Sister    Hearing loss Sister    Hypotension Sister    Depression Sister    Heart disease Sister    Hyperlipidemia Sister    Hypertension Maternal Aunt    Hypertension Maternal Uncle    Clotting disorder Maternal Uncle    Prostate cancer Maternal Uncle    Colon cancer Maternal Uncle    Colon polyps Maternal Uncle    Diabetes Maternal Uncle        all uncles   Arthritis Maternal Grandmother    Asthma Maternal Grandmother    Breast cancer Maternal Grandmother    Stomach cancer Maternal Grandmother    Hearing loss Maternal Grandmother    Diabetes Maternal Grandfather    Heart disease Maternal Grandfather    Arthritis Maternal Grandfather    Hearing loss Maternal Grandfather    Hypertension Maternal Grandfather    Kidney disease Maternal Grandfather    Hyperlipidemia Paternal Grandmother        also MI   Breast cancer Paternal Grandmother    Arthritis Paternal Grandmother    Heart attack Paternal Grandmother    Hyperlipidemia Paternal Grandfather        also MI   Heart disease Paternal Grandfather    Heart attack Paternal Grandfather    Endometriosis Daughter    Hypertension Son    Esophageal cancer Neg Hx    Rectal cancer Neg Hx     Social History   Socioeconomic History   Marital status: Married    Spouse name: Not on file   Number of children: 2   Years of education: 13   Highest education level: Not on file  Occupational History   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  Tobacco Use   Smoking status: Never   Smokeless tobacco: Never  Vaping Use   Vaping status: Never Used  Substance and Sexual Activity   Alcohol use: No   Drug use: No   Sexual activity: Yes    Birth control/protection: Post-menopausal, Surgical  Other Topics Concern   Not on file  Social History Narrative   epworth sleepiness scale = 8 (01/18/2016)   Lives at home with her husband and disabled son   Right handed   Caffeine: decaf   Social Drivers of Health   Financial Resource Strain: Low Risk  (06/09/2024)   Overall Financial Resource Strain (CARDIA)    Difficulty of Paying Living Expenses: Not hard at all  Food Insecurity: No Food Insecurity (06/09/2024)   Hunger Vital Sign    Worried About Running Out of Food in the Last Year: Never true    Ran Out of Food in the Last Year: Never true  Transportation Needs: No Transportation Needs (06/09/2024)   PRAPARE - Administrator, Civil Service (Medical): No    Lack of Transportation (Non-Medical): No  Physical Activity: Insufficiently Active (06/09/2024)   Exercise Vital Sign    Days of Exercise per Week: 3 days    Minutes of Exercise per Session: 30 min  Stress: Stress Concern Present (06/09/2024)   [employer] of Occupational Health - Occupational Stress Questionnaire  Feeling of Stress: To some extent  Social Connections: Socially Integrated (06/09/2024)   Social Connection and Isolation Panel    Frequency of Communication with Friends and Family: More than three times a week    Frequency of Social Gatherings with Friends and Family: More than three times a week    Attends Religious Services: More than 4 times per  year    Active Member of [employer] or Organizations: Yes    Attends Engineer, structural: More than 4 times per year    Marital Status: Married    Tobacco Counseling Counseling given: Not Answered    Clinical Intake:  Pre-visit preparation completed: Yes  Pain : No/denies pain     BMI - recorded: 35.02 Nutritional Status: BMI > 30  Obese Nutritional Risks: None Diabetes: Yes CBG done?: No Did pt. bring in CBG monitor from home?: No  Lab Results  Component Value Date   HGBA1C 6.8 (H) 05/20/2024   HGBA1C 6.0 (A) 02/18/2024   HGBA1C 5.9 (A) 08/20/2023     How often do you need to have someone help you when you read instructions, pamphlets, or other written materials from your doctor or pharmacy?: 1 - Never  Interpreter Needed?: No  Information entered by :: Rojelio Blush LPN   Activities of Daily Living      06/09/2024   10:12 AM  In your present state of health, do you have any difficulty performing the following activities:  Hearing? 0  Vision? 0  Difficulty concentrating or making decisions? 0  Walking or climbing stairs? 0  Dressing or bathing? 0  Doing errands, shopping? 0  Preparing Food and eating ? N  Using the Toilet? N  In the past six months, have you accidently leaked urine? Y  Comment Wears Pads. Followed by PCP  Do you have problems with loss of bowel control? N  Managing your Medications? N  Managing your Finances? N  Housekeeping or managing your Housekeeping? N    Patient Care Team: Merna Huxley, NP as PCP - General (Family Medicine) Mona Vinie BROCKS, MD as PCP - Cardiology (Cardiology) Maurilio Camellia PARAS, MD as Consulting Physician (Allergy and Immunology) Pryor, Madeline G, [facility] (Inactive) as Pharmacist (Pharmacist)   I have updated your Care Teams any recent Medical Services you may have received from other providers in the past year.     Assessment:   This is a routine wellness examination for Kayela.  Hearing/Vision  screen Hearing Screening - Comments:: Denies hearing difficulties   Vision Screening - Comments:: Wears rx glasses - up to date with routine eye exams with  [facility]   Goals Addressed               This Visit's Progress     Increase physical activity (pt-stated)        Remain active       Depression Screen      06/09/2024    9:53 AM 05/20/2024    1:02 PM 10/30/2023    9:27 AM 05/01/2023    2:59 PM 04/09/2023    2:51 PM 09/05/2022    9:26 AM 05/15/2022    9:21 AM  PHQ 2/9 Scores  PHQ - 2 Score 0 6 6 3 3 5 2   PHQ- 9 Score 0 14 23 14 11 16 9     Fall Risk      06/09/2024   10:13 AM 10/30/2023    9:27 AM 05/01/2023    3:01 PM 04/09/2023  2:52 PM 04/09/2022   10:05 AM  Fall Risk   Falls in the past year? 1 1 0 0 0  Number falls in past yr: 0 0 0 0 0  Injury with Fall? 1 1 0 0 0  Comment Bruises to buttocks      Risk for fall due to : No Fall Risks History of fall(s) No Fall Risks Other (Comment) No Fall Risks  Follow up Falls evaluation completed Falls evaluation completed Falls evaluation completed Falls evaluation completed     MEDICARE RISK AT HOME:   Medicare Risk at Home Any stairs in or around the home?: Yes If so, are there any without handrails?: No Home free of loose throw rugs in walkways, pet beds, electrical cords, etc?: Yes Adequate lighting in your home to reduce risk of falls?: Yes Life alert?: No Use of a cane, walker or w/c?: No Grab bars in the bathroom?: No Shower chair or bench in shower?: No Elevated toilet seat or a handicapped toilet?: No  TIMED UP AND GO:  Was the test performed?  Yes  Length of time to ambulate 10 feet: 10 sec Gait steady and fast without use of assistive device  Cognitive Function: 6CIT completed        06/09/2024   10:14 AM 04/09/2022   10:07 AM  6CIT Screen  What Year? 0 points 0 points  What month? 0 points 0 points  What time? 0 points 0 points  Count back from 20 0 points 0 points  Months in reverse 0  points 0 points  Repeat phrase 0 points 0 points  Total Score 0 points 0 points    Immunizations Immunization History  Administered Date(s) Administered   Fluad Quad(high Dose 65+) 09/05/2022   Fluad Trivalent(High Dose 65+) 08/20/2023   Influenza Split 09/05/2015   Influenza Whole 09/10/2007, 07/20/2009, 07/27/2011   Influenza, High Dose Seasonal PF 07/24/2019   Influenza,inj,Quad PF,6+ Mos 08/22/2018   Influenza-Unspecified 07/05/2020, 07/25/2021   PFIZER(Purple Top)SARS-COV-2 Vaccination 12/14/2019, 01/04/2020, 09/18/2020   Pneumococcal Conjugate-13 12/10/2016   Pneumococcal Polysaccharide-23 12/16/2017   Td 10/21/2002   Tdap 01/29/2011    Screening Tests Health Maintenance  Topic Date Due   Zoster Vaccines- Shingrix (1 of 2) Never done   MAMMOGRAM  05/05/2024   INFLUENZA VACCINE  05/21/2024   DTaP/Tdap/Td (3 - Td or Tdap) 10/29/2024 (Originally 01/28/2021)   Fecal DNA (Cologuard)  01/17/2025   Diabetic kidney evaluation - eGFR measurement  05/20/2025   Diabetic kidney evaluation - Urine ACR  05/20/2025   Medicare Annual Wellness (AWV)  06/09/2025   Pneumococcal Vaccine: 50+ Years  Completed   DEXA SCAN  Completed   Hepatitis C Screening  Completed   HPV VACCINES  Aged Out   Meningococcal B Vaccine  Aged Out   COVID-19 Vaccine  Discontinued    Health Maintenance  Health Maintenance Due  Topic Date Due   Zoster Vaccines- Shingrix (1 of 2) Never done   MAMMOGRAM  05/05/2024   INFLUENZA VACCINE  05/21/2024   Health Maintenance Items Addressed: Mammogram scheduled  Additional Screening:  Vision Screening: Recommended annual ophthalmology exams for early detection of glaucoma and other disorders of the eye. Would you like a referral to an eye doctor? No    Dental Screening: Recommended annual dental exams for proper oral hygiene  Community Resource Referral / Chronic Care Management: CRR required this visit?  No   CCM required this visit?  No   Plan:     I  have personally reviewed and noted the following in the patient's chart:   Medical and social history Use of alcohol, tobacco or illicit drugs  Current medications and supplements including opioid prescriptions. Patient is not currently taking opioid prescriptions. Functional ability and status Nutritional status Physical activity Advanced directives List of other physicians Hospitalizations, surgeries, and ER visits in previous 12 months Vitals Screenings to include cognitive, depression, and falls Referrals and appointments  In addition, I have reviewed and discussed with patient certain preventive protocols, quality metrics, and best practice recommendations. A written personalized care plan for preventive services as well as general preventive health recommendations were provided to patient.   Rojelio LELON Blush, LPN   [id number]/7974   After Visit Summary: (In Person-Printed) AVS printed and given to the patient  Notes: Nothing significant to report at this time.

## 2024-06-10 ENCOUNTER — Other Ambulatory Visit (HOSPITAL_COMMUNITY): Payer: Self-pay

## 2024-06-10 DIAGNOSIS — L820 Inflamed seborrheic keratosis: Secondary | ICD-10-CM | POA: Diagnosis not present

## 2024-06-10 DIAGNOSIS — L719 Rosacea, unspecified: Secondary | ICD-10-CM | POA: Diagnosis not present

## 2024-06-10 MED ORDER — METRONIDAZOLE 0.75 % EX GEL
Freq: Two times a day (BID) | CUTANEOUS | 0 refills | Status: AC
  Filled 2024-06-10: qty 45, 30d supply, fill #0

## 2024-06-11 ENCOUNTER — Other Ambulatory Visit: Payer: Self-pay

## 2024-06-15 ENCOUNTER — Other Ambulatory Visit (HOSPITAL_COMMUNITY): Payer: Self-pay

## 2024-06-15 ENCOUNTER — Other Ambulatory Visit: Payer: Self-pay

## 2024-06-16 ENCOUNTER — Other Ambulatory Visit: Payer: Self-pay

## 2024-06-17 ENCOUNTER — Other Ambulatory Visit (HOSPITAL_COMMUNITY): Payer: Self-pay

## 2024-06-17 ENCOUNTER — Other Ambulatory Visit: Payer: Self-pay

## 2024-06-18 ENCOUNTER — Other Ambulatory Visit: Payer: Self-pay

## 2024-06-30 ENCOUNTER — Ambulatory Visit
Admission: RE | Admit: 2024-06-30 | Discharge: 2024-06-30 | Disposition: A | Discharge status: Home / Self Care | Source: Ambulatory Visit | Attending: Adult Health

## 2024-06-30 DIAGNOSIS — Z1231 Encounter for screening mammogram for malignant neoplasm of breast: Secondary | ICD-10-CM

## 2024-07-09 ENCOUNTER — Other Ambulatory Visit: Payer: Self-pay | Admitting: Adult Health

## 2024-07-09 ENCOUNTER — Other Ambulatory Visit: Payer: Self-pay

## 2024-07-09 ENCOUNTER — Other Ambulatory Visit (HOSPITAL_COMMUNITY): Payer: Self-pay

## 2024-07-09 DIAGNOSIS — I10 Essential (primary) hypertension: Secondary | ICD-10-CM

## 2024-07-09 DIAGNOSIS — R7303 Prediabetes: Secondary | ICD-10-CM

## 2024-07-09 MED ORDER — ROSUVASTATIN CALCIUM 10 MG PO TABS
ORAL_TABLET | ORAL | 2 refills | Status: AC
  Filled 2024-07-09: qty 45, 90d supply, fill #0
  Filled 2024-07-15 – 2024-07-20 (×2): qty 15, 30d supply, fill #0
  Filled 2024-08-09 – 2024-08-12 (×2): qty 15, 30d supply, fill #1
  Filled 2024-09-08: qty 15, 30d supply, fill #2
  Filled 2024-11-12: qty 15, 30d supply, fill #3

## 2024-07-09 MED ORDER — METFORMIN HCL 500 MG PO TABS
500.0000 mg | ORAL_TABLET | Freq: Two times a day (BID) | ORAL | 11 refills | Status: AC
  Filled 2024-07-09 – 2024-07-20 (×3): qty 60, 30d supply, fill #0
  Filled 2024-08-12: qty 60, 30d supply, fill #1
  Filled 2024-09-08 – 2024-09-14 (×2): qty 60, 30d supply, fill #2
  Filled 2024-10-17 – 2024-10-18 (×2): qty 60, 30d supply, fill #3
  Filled 2024-11-16 – 2024-11-17 (×2): qty 60, 30d supply, fill #4

## 2024-07-09 MED ORDER — HYDROCHLOROTHIAZIDE 25 MG PO TABS
25.0000 mg | ORAL_TABLET | Freq: Every morning | ORAL | 3 refills | Status: AC
  Filled 2024-07-09 – 2024-07-20 (×3): qty 30, 30d supply, fill #0
  Filled 2024-08-12: qty 30, 30d supply, fill #1
  Filled 2024-09-08 – 2024-09-14 (×2): qty 30, 30d supply, fill #2
  Filled 2024-10-17 – 2024-10-18 (×2): qty 30, 30d supply, fill #3
  Filled 2024-11-16 – 2024-11-17 (×2): qty 30, 30d supply, fill #4

## 2024-07-09 MED ORDER — ESTRADIOL 0.05 MG/24HR TD PTTW
1.0000 | MEDICATED_PATCH | TRANSDERMAL | 10 refills | Status: AC
  Filled 2024-07-09 – 2024-07-20 (×3): qty 8, 28d supply, fill #0
  Filled 2024-08-09 – 2024-08-12 (×2): qty 8, 28d supply, fill #1
  Filled 2024-09-08: qty 8, 28d supply, fill #2
  Filled 2024-10-06: qty 8, 28d supply, fill #3
  Filled 2024-10-29: qty 8, 28d supply, fill #4
  Filled 2024-11-26: qty 8, 28d supply, fill #5

## 2024-07-12 ENCOUNTER — Other Ambulatory Visit: Payer: Self-pay

## 2024-07-15 ENCOUNTER — Other Ambulatory Visit: Payer: Self-pay | Admitting: Adult Health

## 2024-07-15 ENCOUNTER — Other Ambulatory Visit (HOSPITAL_COMMUNITY): Payer: Self-pay

## 2024-07-15 ENCOUNTER — Other Ambulatory Visit: Payer: Self-pay

## 2024-07-15 MED ORDER — FREESTYLE LIBRE 3 PLUS SENSOR MISC
3 refills | Status: AC
  Filled 2024-07-15 – 2024-08-09 (×2): qty 6, 90d supply, fill #0

## 2024-07-16 ENCOUNTER — Other Ambulatory Visit: Payer: Self-pay

## 2024-07-19 ENCOUNTER — Other Ambulatory Visit (HOSPITAL_COMMUNITY): Payer: Self-pay

## 2024-07-19 ENCOUNTER — Other Ambulatory Visit: Payer: Self-pay

## 2024-07-20 ENCOUNTER — Other Ambulatory Visit (HOSPITAL_COMMUNITY): Payer: Self-pay

## 2024-07-20 ENCOUNTER — Other Ambulatory Visit: Payer: Self-pay

## 2024-07-20 MED ORDER — CYCLOSPORINE 0.05 % OP EMUL
1.0000 [drp] | Freq: Two times a day (BID) | OPHTHALMIC | 3 refills | Status: DC
  Filled 2024-07-20: qty 180, 90d supply, fill #0

## 2024-07-21 ENCOUNTER — Other Ambulatory Visit: Payer: Self-pay

## 2024-07-21 ENCOUNTER — Other Ambulatory Visit (HOSPITAL_COMMUNITY): Payer: Self-pay

## 2024-08-09 ENCOUNTER — Other Ambulatory Visit: Payer: Self-pay

## 2024-08-09 ENCOUNTER — Encounter: Payer: Self-pay | Admitting: Family Medicine

## 2024-08-09 ENCOUNTER — Other Ambulatory Visit (HOSPITAL_COMMUNITY): Payer: Self-pay

## 2024-08-09 ENCOUNTER — Ambulatory Visit: Admitting: Family Medicine

## 2024-08-09 ENCOUNTER — Other Ambulatory Visit: Payer: Self-pay | Admitting: Internal Medicine

## 2024-08-09 ENCOUNTER — Other Ambulatory Visit: Payer: Self-pay | Admitting: Adult Health

## 2024-08-09 VITALS — BP 128/66 | HR 58 | Temp 98.0°F | Resp 16 | Ht 63.0 in | Wt 196.8 lb

## 2024-08-09 DIAGNOSIS — N390 Urinary tract infection, site not specified: Secondary | ICD-10-CM

## 2024-08-09 DIAGNOSIS — R300 Dysuria: Secondary | ICD-10-CM | POA: Diagnosis not present

## 2024-08-09 DIAGNOSIS — Z23 Encounter for immunization: Secondary | ICD-10-CM

## 2024-08-09 DIAGNOSIS — F411 Generalized anxiety disorder: Secondary | ICD-10-CM

## 2024-08-09 DIAGNOSIS — R1024 Suprapubic pain: Secondary | ICD-10-CM | POA: Diagnosis not present

## 2024-08-09 LAB — POCT URINALYSIS DIPSTICK
Bilirubin, UA: POSITIVE — AB
Blood, UA: NEGATIVE
Glucose, UA: POSITIVE — AB
Ketones, UA: POSITIVE — AB
Nitrite, UA: POSITIVE
Protein, UA: NEGATIVE
Spec Grav, UA: 1.025 (ref 1.010–1.025)
Urobilinogen, UA: 2 U/dL — AB
pH, UA: 5 (ref 5.0–8.0)

## 2024-08-09 MED ORDER — CEPHALEXIN 500 MG PO CAPS
500.0000 mg | ORAL_CAPSULE | Freq: Three times a day (TID) | ORAL | 0 refills | Status: AC
  Filled 2024-08-09: qty 15, 5d supply, fill #0

## 2024-08-09 NOTE — Progress Notes (Signed)
 Chief Complaint  Patient presents with   Dysuria    Pt reports sx pain, pressure, burning, feeling urgency to urinate. Sx on Friday. Taking AZO.    HPI Amber Brewer is a 73 y.o. female with past medical history significant for hypertension, PVCs, hypothyroidism, hyperlipidemia, and anxiety, who is here today complaining of 3 of urinary symptoms as described above.  Dysuria  This is a new problem. The current episode started in the past 7 days. The problem occurs every urination. The problem has been unchanged. The quality of the pain is described as burning. The pain is mild. There has been no fever. She is Not sexually active. There is No history of pyelonephritis. Associated symptoms include frequency, hesitancy and urgency. Pertinent negatives include no chills, discharge, flank pain, hematuria, nausea, sweats or vomiting. The treatment provided mild relief.   Suprapubic abdominal pain, intermittent, pressure sensation and occasionally sharp pains that  do not last long. No changes in bowel habit, last one today. Negative for blood in stool or melena. Status post hysterectomy.  Review of Systems  Constitutional:  Negative for activity change, appetite change, chills and fever.  Respiratory:  Negative for cough and shortness of breath.   Gastrointestinal:  Negative for nausea and vomiting.  Genitourinary:  Positive for dysuria, frequency, hesitancy and urgency. Negative for flank pain and hematuria.  Musculoskeletal:  Negative for gait problem and myalgias.  Skin:  Negative for rash.  Neurological:  Negative for syncope and weakness.  Psychiatric/Behavioral:  Negative for confusion and hallucinations.   See other pertinent positives and negatives in HPI.  Current Outpatient Medications on File Prior to Visit  Medication Sig Dispense Refill   albuterol  (VENTOLIN  HFA) 108 (90 Base) MCG/ACT inhaler Inhale 2 puffs into the lungs every 4 (four) hours as needed for wheezing or  shortness of breath. 6.7 g 1   amLODipine  (NORVASC ) 5 MG tablet Take 1 tablet (5 mg total) by mouth daily. 90 tablet 3   blood glucose meter kit and supplies Dispense based on patient and insurance preference. Use up to four times daily as directed. (FOR ICD-10 E10.9, E11.9). 1 each 0   budesonide (RHINOCORT AQUA) 32 MCG/ACT nasal spray Place 1 spray into both nostrils daily as needed for allergies.      calcium  carbonate (OSCAL) 1500 (600 Ca) MG TABS tablet Take by mouth 2 (two) times daily with a meal.     carvedilol  (COREG ) 25 MG tablet Take 1 tablet (25 mg total) by mouth 2 (two) times daily. 180 tablet 1   cetirizine  (ZYRTEC ) 10 MG tablet Take 1 tablet (10 mg total) by mouth daily as needed (Can take an extra dose during flare ups.). 180 tablet 1   Cholecalciferol  (VITAMIN D) 50 MCG (2000 UT) tablet Take 2,000 Units by mouth daily.     Cholecalciferol  (VITAMIN D3) 50 MCG (2000 UT) capsule Take 1 capsule (2,000 Units total) by mouth daily. 90 capsule 3   citalopram  (CELEXA ) 10 MG tablet Take 1 tablet (10 mg total) by mouth every morning. 90 tablet 1   Continuous Glucose Receiver (FREESTYLE LIBRE 3 READER) DEVI Use with libre sensor to check blood sugar 1 each 0   Continuous Glucose Sensor (FREESTYLE LIBRE 3 PLUS SENSOR) MISC Change sensor every 15 days. 6 each 3   cycloSPORINE (RESTASIS) 0.05 % ophthalmic emulsion Place 1 drop into both eyes 2 (two) times daily.     cycloSPORINE (RESTASIS) 0.05 % ophthalmic emulsion Instill 1 drop into affected  eyes every 12 hours 180 each 3   estradiol  (VIVELLE -DOT) 0.05 MG/24HR patch Place 1 patch (0.05 mg total) onto the skin 2 (two) times a week. 8 patch 10   famotidine  (PEPCID ) 40 MG tablet Take 1 tablet (40 mg total) by mouth daily before breakfast. 90 tablet 3   Fluticasone -Umeclidin-Vilant (TRELEGY ELLIPTA ) 200-62.5-25 MCG/ACT AEPB Inhale 1 puff into the lungs every other day. 84 each 3   hydrochlorothiazide  (HYDRODIURIL ) 25 MG tablet Take 1 tablet (25  mg total) by mouth every morning. 90 tablet 3   irbesartan  (AVAPRO ) 150 MG tablet Take 1 tablet (150 mg total) by mouth in the morning and at bedtime. 180 tablet 3   Lancets (ONETOUCH DELICA PLUS LANCET33G) MISC USE TO check blood glucose UP TO four times daily AS DIRECTED 100 each 0   levothyroxine  (SYNTHROID ) 50 MCG tablet Take 1 tablet (50 mcg total) by mouth every morning. 90 tablet 0   loratadine (CLARITIN) 5 MG chewable tablet Chew 5 mg by mouth daily.     metFORMIN  (GLUCOPHAGE ) 500 MG tablet Take 1 tablet (500 mg total) by mouth 2 (two) times daily with a meal. 180 tablet 11   metroNIDAZOLE  (METROGEL ) 0.75 % gel Apply 1 Application topically 2 (two) times daily. 45 g 0   metroNIDAZOLE  (METROGEL ) 0.75 % gel Apply topically 2 (two) times daily. 45 g 0   minoxidil (ROGAINE) 2 % external solution Apply topically daily.     ONETOUCH ULTRA test strip USE TO check blood glucose UP TO four times daily AS DIRECTED 100 strip 0   Oyster Shell 500 MG TABS Take 1 tablet (500 mg total) by mouth 2 (two) times daily. 180 tablet 3   potassium chloride  (KLOR-CON ) 10 MEQ tablet Take 1 tablet (10 mEq total) by mouth every evening. 90 tablet 3   rosuvastatin  (CRESTOR ) 10 MG tablet Take 1 tablet (10 mg total) by mouth every other evening. 90 tablet 2   triamcinolone  (NASACORT ) 55 MCG/ACT AERO nasal inhaler Place 1 spray into the nose 2 (two) times daily. 50.7 mL 3   Turmeric 500 MG CAPS SMARTSIG:1 Capsule(s) By Mouth Every Evening     No current facility-administered medications on file prior to visit.    Past Medical History:  Diagnosis Date   Allergy    Anemia    age 21 only   Asthma    Bowen's disease    ~ 17 yrs ago   Chicken pox    DDD (degenerative disc disease), cervical    Depression    Diabetes mellitus without complication (HCC)    on metformin  - goes back andf forth   Gallstones    GERD (gastroesophageal reflux disease)    Hx of melanoma in situ 2002   Hx: UTI (urinary tract infection)     Hyperlipidemia    Hypertension    Hypothyroidism    IBS (irritable bowel syndrome)    with D   Melanoma (HCC)    x2    Nephrolithiasis    Osteoarthritis    PVC's (premature ventricular contractions)    Status post dilation of esophageal narrowing    Allergies  Allergen Reactions   Prednisone Shortness Of Breath   Codeine Phosphate Nausea And Vomiting   Sulfa Antibiotics Nausea And Vomiting    Social History   Socioeconomic History   Marital status: Married    Spouse name: Not on file   Number of children: 2   Years of education: 13   Highest education level:  Not on file  Occupational History   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  Tobacco Use   Smoking status: Never   Smokeless tobacco: Never  Vaping Use   Vaping status: Never Used  Substance and Sexual Activity   Alcohol use: No   Drug use: No   Sexual activity: Yes    Birth control/protection: Post-menopausal, Surgical  Other Topics Concern   Not on file  Social History Narrative   epworth sleepiness scale = 8 (01/18/2016)   Lives at home with her husband and disabled son   Right handed   Caffeine: decaf   Social Drivers of Corporate investment banker Strain: Low Risk  (06/09/2024)   Overall Financial Resource Strain (CARDIA)    Difficulty of Paying Living Expenses: Not hard at all  Food Insecurity: No Food Insecurity (06/09/2024)   Hunger Vital Sign    Worried About Running Out of Food in the Last Year: Never true    Ran Out of Food in the Last Year: Never true  Transportation Needs: No Transportation Needs (06/09/2024)   PRAPARE - Administrator, Civil Service (Medical): No    Lack of Transportation (Non-Medical): No  Physical Activity: Insufficiently Active (06/09/2024)   Exercise Vital Sign    Days of Exercise per Week: 3 days    Minutes of Exercise per Session: 30 min  Stress: Stress Concern Present (06/09/2024)   [employer] of  Occupational Health - Occupational Stress Questionnaire    Feeling of Stress: To some extent  Social Connections: Socially Integrated (06/09/2024)   Social Connection and Isolation Panel    Frequency of Communication with Friends and Family: More than three times a week    Frequency of Social Gatherings with Friends and Family: More than three times a week    Attends Religious Services: More than 4 times per year    Active Member of [employer] or Organizations: Yes    Attends Banker Meetings: More than 4 times per year    Marital Status: Married    Vitals:   08/09/24 1404  BP: 128/66  Pulse: (!) 58  Resp: 16  Temp: 98 F (36.7 C)  SpO2: 97%   Body mass index is 34.86 kg/m.  Physical Exam Vitals and nursing note reviewed.  Constitutional:      General: She is not in acute distress.    Appearance: She is well-developed.  HENT:     Head: Normocephalic and atraumatic.  Eyes:     Conjunctiva/sclera: Conjunctivae normal.  Cardiovascular:     Rate and Rhythm: Regular rhythm. Bradycardia present.  Pulmonary:     Effort: Pulmonary effort is normal. No respiratory distress.     Breath sounds: Normal breath sounds.  Abdominal:     Palpations: Abdomen is soft. There is no mass.     Tenderness: There is abdominal tenderness in the suprapubic area. There is no right CVA tenderness, left CVA tenderness, guarding or rebound.  Skin:    General: Skin is warm.     Findings: No erythema.  Neurological:     General: No focal deficit present.     Mental Status: She is alert and oriented to person, place, and time.     Gait: Gait normal.  Psychiatric:        Mood and Affect: Mood and affect normal.    ASSESSMENT AND PLAN:  Amber Brewer was seen today for dysuria.  Diagnoses  and all orders for this visit:  Dysuria With associated urinary frequency. Urine dipstick abnormal, some may be related to azo. Urine sent for Cx.  -     POC Urinalysis Dipstick -     Urine  Culture; Future -     Urine Culture  Urinary tract infection without hematuria, site unspecified Allergic to sulfa meds and hx of PVC's, so recommend empiric treatment with Cephalexin 500 mg tid x 5 d. Monitor for new symptoms. Continue adequate hydration. Treatment will be tailored according to Ucx results.  -     cephALEXin (KEFLEX) 500 MG capsule; Take 1 capsule (500 mg total) by mouth 3 (three) times daily for 5 days.  Suprapubic abdominal pain We discussed differential Dx. I do not think blood work or imaging are needed at this time. Clearly instructed about warning signs.  Influenza vaccine needed -     Flu vaccine HIGH DOSE PF(Fluzone Trivalent)   Return if symptoms worsen or fail to improve.  See Beharry G. [place], MD  [facility]. [place] office.

## 2024-08-09 NOTE — Patient Instructions (Addendum)
 A few things to remember from today's visit:  Dysuria - Plan: POC Urinalysis Dipstick, Urine Culture  Influenza vaccine needed - Plan: Flu vaccine HIGH DOSE PF(Fluzone Trivalent)  Urinary tract infection without hematuria, site unspecified  Suprapubic abdominal pain Cephalexin 500 mg 3 times per day for 5 days for urine tract infection. Monitor for new symptoms. Continue adequate hydration. Will follow urine culture and make further recommendations accordingly.  If symptoms are persistent please follow-up with PCP.  If you need refills for medications you take chronically, please call your pharmacy. Do not use My Chart to request refills or for acute issues that need immediate attention. If you send a my chart message, it may take a few days to be addressed, specially if I am not in the office.  Please be sure medication list is accurate. If a new problem present, please set up appointment sooner than planned today.

## 2024-08-10 ENCOUNTER — Other Ambulatory Visit (HOSPITAL_COMMUNITY): Payer: Self-pay

## 2024-08-10 MED ORDER — VITAMIN D3 50 MCG (2000 UT) PO CAPS
2000.0000 [IU] | ORAL_CAPSULE | Freq: Every day | ORAL | 3 refills | Status: AC
  Filled 2024-08-10: qty 90, 90d supply, fill #0
  Filled 2024-08-13: qty 30, 30d supply, fill #0
  Filled 2024-09-08 – 2024-09-14 (×2): qty 30, 30d supply, fill #1
  Filled 2024-10-17 – 2024-10-19 (×3): qty 30, 30d supply, fill #2
  Filled 2024-11-16 – 2024-11-17 (×2): qty 30, 30d supply, fill #3

## 2024-08-10 MED ORDER — CITALOPRAM HYDROBROMIDE 10 MG PO TABS
10.0000 mg | ORAL_TABLET | Freq: Every morning | ORAL | 1 refills | Status: AC
  Filled 2024-08-10: qty 90, 90d supply, fill #0
  Filled 2024-08-13: qty 30, 30d supply, fill #0
  Filled 2024-09-08 – 2024-09-14 (×2): qty 30, 30d supply, fill #1
  Filled 2024-10-17 – 2024-10-18 (×2): qty 30, 30d supply, fill #2
  Filled 2024-11-16 – 2024-11-17 (×2): qty 30, 30d supply, fill #3

## 2024-08-11 ENCOUNTER — Ambulatory Visit: Payer: Self-pay | Admitting: Family Medicine

## 2024-08-11 LAB — URINE CULTURE
MICRO NUMBER:: 17121543
Result:: NO GROWTH
SPECIMEN QUALITY:: ADEQUATE

## 2024-08-12 ENCOUNTER — Other Ambulatory Visit (HOSPITAL_COMMUNITY): Payer: Self-pay

## 2024-08-12 ENCOUNTER — Other Ambulatory Visit: Payer: Self-pay

## 2024-08-12 MED ORDER — CARVEDILOL 25 MG PO TABS
25.0000 mg | ORAL_TABLET | Freq: Two times a day (BID) | ORAL | 0 refills | Status: DC
  Filled 2024-08-12 – 2024-08-13 (×2): qty 60, 30d supply, fill #0

## 2024-08-12 MED ORDER — AMLODIPINE BESYLATE 5 MG PO TABS
5.0000 mg | ORAL_TABLET | Freq: Every day | ORAL | 0 refills | Status: DC
  Filled 2024-08-12 – 2024-08-13 (×2): qty 30, 30d supply, fill #0

## 2024-08-13 ENCOUNTER — Other Ambulatory Visit (HOSPITAL_COMMUNITY): Payer: Self-pay

## 2024-08-13 ENCOUNTER — Other Ambulatory Visit: Payer: Self-pay

## 2024-08-16 ENCOUNTER — Other Ambulatory Visit: Payer: Self-pay

## 2024-08-18 ENCOUNTER — Other Ambulatory Visit: Payer: Self-pay | Admitting: Adult Health

## 2024-08-18 ENCOUNTER — Other Ambulatory Visit: Payer: Self-pay

## 2024-08-18 MED ORDER — LEVOTHYROXINE SODIUM 50 MCG PO TABS
50.0000 ug | ORAL_TABLET | Freq: Every morning | ORAL | 0 refills | Status: DC
  Filled 2024-08-18: qty 90, 90d supply, fill #0
  Filled 2024-08-18: qty 30, 30d supply, fill #0
  Filled 2024-09-08 – 2024-09-14 (×3): qty 30, 30d supply, fill #1
  Filled 2024-10-14: qty 30, 30d supply, fill #2

## 2024-09-03 ENCOUNTER — Other Ambulatory Visit: Payer: Self-pay

## 2024-09-03 ENCOUNTER — Encounter: Payer: Self-pay | Admitting: Adult Health

## 2024-09-03 ENCOUNTER — Ambulatory Visit (INDEPENDENT_AMBULATORY_CARE_PROVIDER_SITE_OTHER): Admitting: Adult Health

## 2024-09-03 ENCOUNTER — Other Ambulatory Visit (HOSPITAL_COMMUNITY): Payer: Self-pay

## 2024-09-03 VITALS — BP 120/70 | HR 62 | Temp 98.2°F | Ht 63.0 in | Wt 196.0 lb

## 2024-09-03 DIAGNOSIS — R300 Dysuria: Secondary | ICD-10-CM

## 2024-09-03 DIAGNOSIS — N3010 Interstitial cystitis (chronic) without hematuria: Secondary | ICD-10-CM | POA: Diagnosis not present

## 2024-09-03 LAB — POCT URINALYSIS DIPSTICK
Bilirubin, UA: NEGATIVE
Blood, UA: NEGATIVE
Glucose, UA: NEGATIVE
Ketones, UA: NEGATIVE
Leukocytes, UA: NEGATIVE
Nitrite, UA: NEGATIVE
Protein, UA: NEGATIVE
Spec Grav, UA: 1.025 (ref 1.010–1.025)
Urobilinogen, UA: 0.2 U/dL
pH, UA: 6 (ref 5.0–8.0)

## 2024-09-03 MED ORDER — AMITRIPTYLINE HCL 10 MG PO TABS
10.0000 mg | ORAL_TABLET | Freq: Every day | ORAL | 1 refills | Status: DC
  Filled 2024-09-03: qty 30, 30d supply, fill #0
  Filled 2024-09-27: qty 30, 30d supply, fill #1

## 2024-09-03 NOTE — Progress Notes (Signed)
 Subjective:    Patient ID: Amber Brewer, female    DOB: 1951-10-16, 73 y.o.   MRN: [id number]  Urinary Frequency  Associated symptoms include frequency and urgency.   Discussed the use of AI scribe software for clinical note transcription with the patient, who gave verbal consent to proceed.  History of Present Illness   Amber Brewer is a 73 year old female who presents with urinary symptoms and pelvic pain.  She experiences burning during urination, urgency, and sharp pelvic pain, primarily in the left groin, with occasional radiation and cramping. The burning is absent at night, but she frequently wakes with an urge to urinate, disrupting sleep. She feels incomplete bladder emptying and experiences stress incontinence with coughing. Urinalysis is normal. There is no hematuria, fever, chills, or vaginal discharge.    She was seen by another provider about three weeks ago for the same symptoms. Urine culture was negative but symptoms continued. She does not drink soda. Has one cup of coffee a day and and has been increasing her water   AZO helps alleviate symptoms until she stops taking it.        Review of Systems  Respiratory: Negative.    Cardiovascular: Negative.   Gastrointestinal: Negative.   Genitourinary:  Positive for dysuria, frequency, pelvic pain and urgency. Negative for vaginal bleeding and vaginal discharge.  Musculoskeletal: Negative.   Skin: Negative.    Past Medical History:  Diagnosis Date   Allergy    Anemia    age 17 only   Asthma    Bowen's disease    ~ 17 yrs ago   Chicken pox    DDD (degenerative disc disease), cervical    Depression    Diabetes mellitus without complication (HCC)    on metformin  - goes back andf forth   Gallstones    GERD (gastroesophageal reflux disease)    Hx of melanoma in situ 2002   Hx: UTI (urinary tract infection)    Hyperlipidemia    Hypertension    Hypothyroidism    IBS (irritable bowel syndrome)    with D    Melanoma (HCC)    x2    Nephrolithiasis    Osteoarthritis    PVC's (premature ventricular contractions)    Status post dilation of esophageal narrowing     Social History   Socioeconomic History   Marital status: Married    Spouse name: Not on file   Number of children: 2   Years of education: 13   Highest education level: Not on file  Occupational History   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  Tobacco Use   Smoking status: Never   Smokeless tobacco: Never  Vaping Use   Vaping status: Never Used  Substance and Sexual Activity   Alcohol use: No   Drug use: No   Sexual activity: Yes    Birth control/protection: Post-menopausal, Surgical  Other Topics Concern   Not on file  Social History Narrative   epworth sleepiness scale = 8 (01/18/2016)   Lives at home with her husband and disabled son   Right handed   Caffeine: decaf   Social Drivers of Corporate Investment Banker Strain: Low Risk  (06/09/2024)   Overall Financial Resource Strain (CARDIA)    Difficulty of Paying Living Expenses: Not hard at all  Food Insecurity: No Food Insecurity (06/09/2024)   Hunger Vital Sign    Worried About Running Out of  Food in the Last Year: Never true    Ran Out of Food in the Last Year: Never true  Transportation Needs: No Transportation Needs (06/09/2024)   PRAPARE - Administrator, Civil Service (Medical): No    Lack of Transportation (Non-Medical): No  Physical Activity: Insufficiently Active (06/09/2024)   Exercise Vital Sign    Days of Exercise per Week: 3 days    Minutes of Exercise per Session: 30 min  Stress: Stress Concern Present (06/09/2024)   [employer] of Occupational Health - Occupational Stress Questionnaire    Feeling of Stress: To some extent  Social Connections: Socially Integrated (06/09/2024)   Social Connection and Isolation Panel    Frequency of Communication with Friends and Family: More than  three times a week    Frequency of Social Gatherings with Friends and Family: More than three times a week    Attends Religious Services: More than 4 times per year    Active Member of [employer] or Organizations: Yes    Attends Engineer, Structural: More than 4 times per year    Marital Status: Married  Catering Manager Violence: Not At Risk (06/09/2024)   Humiliation, Afraid, Rape, and Kick questionnaire    Fear of Current or Ex-Partner: No    Emotionally Abused: No    Physically Abused: No    Sexually Abused: No    Past Surgical History:  Procedure Laterality Date   ABDOMINAL HYSTERECTOMY  1974   APPENDECTOMY  1977   BLADDER SURGERY  1974   BREAST EXCISIONAL BIOPSY     CATARACT EXTRACTION Bilateral 2015   CHOLECYSTECTOMY  1987   COLONOSCOPY     RECTAL SURGERY     ~17 yrs ago- Bowens dx per pt with surgery   removal of melanoma in situ      Family History  Problem Relation Age of Onset   Hypertension Mother    Thyroid  disease Mother    Arthritis Mother    Depression Mother    Hearing loss Mother    Miscarriages / Stillbirths Mother    Heart attack Father    Heart disease Father    Alcohol abuse Father    Arthritis Father    Depression Father    Drug abuse Father    Hypertension Father    Alcohol abuse Sister    Arthritis Sister    Depression Sister    Hearing loss Sister    Hypotension Sister    Depression Sister    Heart disease Sister    Hyperlipidemia Sister    Hypertension Maternal Aunt    Hypertension Maternal Uncle    Clotting disorder Maternal Uncle    Prostate cancer Maternal Uncle    Colon cancer Maternal Uncle    Colon polyps Maternal Uncle    Diabetes Maternal Uncle        all uncles   Arthritis Maternal Grandmother    Asthma Maternal Grandmother    Breast cancer Maternal Grandmother    Stomach cancer Maternal Grandmother    Hearing loss Maternal Grandmother    Diabetes Maternal Grandfather    Heart disease Maternal Grandfather     Arthritis Maternal Grandfather    Hearing loss Maternal Grandfather    Hypertension Maternal Grandfather    Kidney disease Maternal Grandfather    Hyperlipidemia Paternal Grandmother        also MI   Breast cancer Paternal Grandmother    Arthritis Paternal Grandmother    Heart attack Paternal Grandmother  Hyperlipidemia Paternal Grandfather        also MI   Heart disease Paternal Grandfather    Heart attack Paternal Grandfather    Endometriosis Daughter    Hypertension Son    Esophageal cancer Neg Hx    Rectal cancer Neg Hx     Allergies  Allergen Reactions   Prednisone Shortness Of Breath   Codeine Phosphate Nausea And Vomiting   Sulfa Antibiotics Nausea And Vomiting    Current Outpatient Medications on File Prior to Visit  Medication Sig Dispense Refill   albuterol  (VENTOLIN  HFA) 108 (90 Base) MCG/ACT inhaler Inhale 2 puffs into the lungs every 4 (four) hours as needed for wheezing or shortness of breath. 6.7 g 1   amLODipine  (NORVASC ) 5 MG tablet Take 1 tablet (5 mg total) by mouth daily. 30 tablet 0   blood glucose meter kit and supplies Dispense based on patient and insurance preference. Use up to four times daily as directed. (FOR ICD-10 E10.9, E11.9). 1 each 0   budesonide (RHINOCORT AQUA) 32 MCG/ACT nasal spray Place 1 spray into both nostrils daily as needed for allergies.      calcium  carbonate (OSCAL) 1500 (600 Ca) MG TABS tablet Take by mouth 2 (two) times daily with a meal.     carvedilol  (COREG ) 25 MG tablet Take 1 tablet (25 mg total) by mouth 2 (two) times daily. 60 tablet 0   cetirizine  (ZYRTEC ) 10 MG tablet Take 1 tablet (10 mg total) by mouth daily as needed (Can take an extra dose during flare ups.). 180 tablet 1   Cholecalciferol  (VITAMIN D) 50 MCG (2000 UT) tablet Take 2,000 Units by mouth daily.     Cholecalciferol  (VITAMIN D3) 50 MCG (2000 UT) capsule Take 1 capsule (2,000 Units total) by mouth daily. 90 capsule 3   citalopram  (CELEXA ) 10 MG tablet Take  1 tablet (10 mg total) by mouth every morning. 90 tablet 1   Continuous Glucose Receiver (FREESTYLE LIBRE 3 READER) DEVI Use with libre sensor to check blood sugar 1 each 0   Continuous Glucose Sensor (FREESTYLE LIBRE 3 PLUS SENSOR) MISC Change sensor every 15 days. 6 each 3   cycloSPORINE (RESTASIS) 0.05 % ophthalmic emulsion Place 1 drop into both eyes 2 (two) times daily.     cycloSPORINE (RESTASIS) 0.05 % ophthalmic emulsion Instill 1 drop into affected eyes every 12 hours 180 each 3   estradiol  (VIVELLE -DOT) 0.05 MG/24HR patch Place 1 patch (0.05 mg total) onto the skin 2 (two) times a week. 8 patch 10   famotidine  (PEPCID ) 40 MG tablet Take 1 tablet (40 mg total) by mouth daily before breakfast. 90 tablet 3   Fluticasone -Umeclidin-Vilant (TRELEGY ELLIPTA ) 200-62.5-25 MCG/ACT AEPB Inhale 1 puff into the lungs every other day. 84 each 3   hydrochlorothiazide  (HYDRODIURIL ) 25 MG tablet Take 1 tablet (25 mg total) by mouth every morning. 90 tablet 3   irbesartan  (AVAPRO ) 150 MG tablet Take 1 tablet (150 mg total) by mouth in the morning and at bedtime. 180 tablet 3   Lancets (ONETOUCH DELICA PLUS LANCET33G) MISC USE TO check blood glucose UP TO four times daily AS DIRECTED 100 each 0   levothyroxine  (SYNTHROID ) 50 MCG tablet Take 1 tablet (50 mcg total) by mouth every morning. 90 tablet 0   loratadine (CLARITIN) 5 MG chewable tablet Chew 5 mg by mouth daily.     metFORMIN  (GLUCOPHAGE ) 500 MG tablet Take 1 tablet (500 mg total) by mouth 2 (two) times daily with a  meal. 180 tablet 11   metroNIDAZOLE  (METROGEL ) 0.75 % gel Apply 1 Application topically 2 (two) times daily. 45 g 0   metroNIDAZOLE  (METROGEL ) 0.75 % gel Apply topically 2 (two) times daily. 45 g 0   minoxidil (ROGAINE) 2 % external solution Apply topically daily.     ONETOUCH ULTRA test strip USE TO check blood glucose UP TO four times daily AS DIRECTED 100 strip 0   Oyster Shell 500 MG TABS Take 1 tablet (500 mg total) by mouth 2 (two)  times daily. 180 tablet 3   potassium chloride  (KLOR-CON ) 10 MEQ tablet Take 1 tablet (10 mEq total) by mouth every evening. 90 tablet 3   rosuvastatin  (CRESTOR ) 10 MG tablet Take 1 tablet (10 mg total) by mouth every other evening. 90 tablet 2   triamcinolone  (NASACORT ) 55 MCG/ACT AERO nasal inhaler Place 1 spray into the nose 2 (two) times daily. 50.7 mL 3   Turmeric 500 MG CAPS SMARTSIG:1 Capsule(s) By Mouth Every Evening     No current facility-administered medications on file prior to visit.    BP 120/70 (BP Location: Left Arm, Patient Position: Sitting)   Pulse 62   Temp 98.2 F (36.8 C) (Oral)   Ht 5' 3 (1.6 m)   Wt 196 lb (88.9 kg)   SpO2 97%   BMI 34.72 kg/m       Objective:   Physical Exam Vitals and nursing note reviewed.  Constitutional:      Appearance: Normal appearance.  Cardiovascular:     Rate and Rhythm: Normal rate and regular rhythm.     Pulses: Normal pulses.     Heart sounds: Normal heart sounds.  Pulmonary:     Effort: Pulmonary effort is normal.     Breath sounds: Normal breath sounds.  Abdominal:     General: Abdomen is flat. Bowel sounds are normal.     Palpations: Abdomen is soft.     Tenderness: There is abdominal tenderness in the suprapubic area and left lower quadrant. There is no right CVA tenderness or left CVA tenderness.  Musculoskeletal:        General: Normal range of motion.  Skin:    General: Skin is warm and dry.  Neurological:     General: No focal deficit present.     Mental Status: She is alert and oriented to person, place, and time.  Psychiatric:        Mood and Affect: Mood normal.        Behavior: Behavior normal.        Thought Content: Thought content normal.        Judgment: Judgment normal.        Assessment & Plan:   Assessment and Plan    Interstitial cystitis without hematuria Symptoms of dysuria, urgency, and left-sided groin pain suggest interstitial cystitis. Normal urinalysis excludes UTI. No  hematuria or significant dietary triggers. Previous cystitis and yeast infections noted.  - Prescribed amitriptyline 10 mg at bedtime for two weeks. - Advised discontinuation of AZO - Consider referral to UroGYN if symptoms persist.       Darleene Shape, NP   I personally spent a total of 30 minutes in the care of the patient today including preparing to see the patient, getting/reviewing separately obtained history, performing a medically appropriate exam/evaluation, counseling and educating, placing orders, and documenting clinical information in the EHR.

## 2024-09-06 ENCOUNTER — Other Ambulatory Visit: Payer: Self-pay

## 2024-09-08 ENCOUNTER — Telehealth: Payer: Self-pay | Admitting: Internal Medicine

## 2024-09-08 ENCOUNTER — Other Ambulatory Visit: Payer: Self-pay

## 2024-09-08 ENCOUNTER — Other Ambulatory Visit: Payer: Self-pay | Admitting: Adult Health

## 2024-09-08 ENCOUNTER — Other Ambulatory Visit (HOSPITAL_COMMUNITY): Payer: Self-pay

## 2024-09-08 MED ORDER — OYSTER SHELL 500 MG PO TABS
500.0000 mg | ORAL_TABLET | Freq: Two times a day (BID) | ORAL | 3 refills | Status: AC
  Filled 2024-09-08 – 2024-09-14 (×2): qty 60, 30d supply, fill #0
  Filled 2024-10-17 – 2024-10-18 (×2): qty 60, 30d supply, fill #1
  Filled 2024-11-16 – 2024-11-17 (×2): qty 60, 30d supply, fill #2

## 2024-09-08 NOTE — Telephone Encounter (Signed)
 Okay for refill?

## 2024-09-09 ENCOUNTER — Other Ambulatory Visit: Payer: Self-pay

## 2024-09-10 ENCOUNTER — Other Ambulatory Visit: Payer: Self-pay

## 2024-09-10 ENCOUNTER — Other Ambulatory Visit (HOSPITAL_COMMUNITY): Payer: Self-pay

## 2024-09-10 MED ORDER — AMLODIPINE BESYLATE 5 MG PO TABS
5.0000 mg | ORAL_TABLET | Freq: Every day | ORAL | 0 refills | Status: DC
  Filled 2024-09-10 – 2024-09-15 (×5): qty 15, 15d supply, fill #0

## 2024-09-10 MED ORDER — CARVEDILOL 25 MG PO TABS
25.0000 mg | ORAL_TABLET | Freq: Two times a day (BID) | ORAL | 0 refills | Status: DC
  Filled 2024-09-10 – 2024-09-15 (×5): qty 30, 15d supply, fill #0

## 2024-09-11 ENCOUNTER — Other Ambulatory Visit (HOSPITAL_COMMUNITY): Payer: Self-pay

## 2024-09-12 ENCOUNTER — Other Ambulatory Visit (HOSPITAL_COMMUNITY): Payer: Self-pay

## 2024-09-13 ENCOUNTER — Other Ambulatory Visit: Payer: Self-pay

## 2024-09-14 ENCOUNTER — Other Ambulatory Visit: Payer: Self-pay

## 2024-09-14 ENCOUNTER — Other Ambulatory Visit (HOSPITAL_COMMUNITY): Payer: Self-pay

## 2024-09-15 ENCOUNTER — Other Ambulatory Visit: Payer: Self-pay

## 2024-09-15 ENCOUNTER — Other Ambulatory Visit (HOSPITAL_COMMUNITY): Payer: Self-pay

## 2024-09-17 ENCOUNTER — Other Ambulatory Visit (HOSPITAL_COMMUNITY): Payer: Self-pay

## 2024-09-22 ENCOUNTER — Encounter: Payer: Self-pay | Admitting: Adult Health

## 2024-09-22 NOTE — Telephone Encounter (Signed)
 Please advise

## 2024-09-23 ENCOUNTER — Other Ambulatory Visit (HOSPITAL_COMMUNITY): Payer: Self-pay

## 2024-09-23 ENCOUNTER — Other Ambulatory Visit: Payer: Self-pay

## 2024-09-24 ENCOUNTER — Ambulatory Visit: Payer: Self-pay

## 2024-09-24 ENCOUNTER — Other Ambulatory Visit: Payer: Self-pay

## 2024-09-24 ENCOUNTER — Other Ambulatory Visit (HOSPITAL_COMMUNITY): Payer: Self-pay

## 2024-09-24 MED ORDER — HYDROXYZINE PAMOATE 50 MG PO CAPS
50.0000 mg | ORAL_CAPSULE | Freq: Every day | ORAL | 0 refills | Status: DC
  Filled 2024-09-24 (×2): qty 30, 30d supply, fill #0

## 2024-09-24 NOTE — Telephone Encounter (Signed)
*  STAT* If patient is at the pharmacy, call can be transferred to refill team.   1. Which medications need to be refilled? (please list name of each medication and dose if known)   amLODipine  (NORVASC ) 5 MG tablet  carvedilol  (COREG ) 25 MG tablet   2. Would you like to learn more about the convenience, safety, & potential cost savings by using the [facility]?   3. Are you open to using the [facility] (Type [facility]. ).  4. Which pharmacy/location (including street and city if local pharmacy) is medication to be sent to?  [place] [facility]   5. Do they need a 30 day or 90 day supply?   Patient stated she still has some medication.  Patient has appointment scheduled with Dr. Mona on 3/4.

## 2024-09-24 NOTE — Telephone Encounter (Signed)
 FYI Only or Action Required?: FYI only for provider: appointment scheduled on 09/29/2024.  Patient was last seen in primary care on 09/03/2024 by Merna Huxley, NP.  Called Nurse Triage reporting Pelvic Pain.  Symptoms began several months ago.  Interventions attempted: Prescription medications: antibiotics, amytriptyline.  Symptoms are: unchanged.  Triage Disposition: See PCP Within 2 Weeks  Patient/caregiver understands and will follow disposition?: Yes  Message from Lauren C sent at 09/24/2024  4:13 PM EST  Reason for Triage: Pt calling regarding worsening interstitial cystitis sxs, wanting appt next week. [phone]   Reason for Disposition  [1] Pelvic pain is a chronic symptom (recurrent or ongoing AND [2] present > 4 weeks)  Answer Assessment - Initial Assessment Questions 1. LOCATION: Where does it hurt?      Feeling of pelvic  2. RADIATION: Does the pain shoot anywhere else? (e.g., lower back, groin, thighs)     denies 3. ONSET: When did the pain begin? (e.g., minutes, hours or days ago)      About two months ago 4. SUDDEN: Gradual or sudden onset?     gradual 5. PATTERN Does the pain come and go, or is it constant?     constant 6. SEVERITY: How bad is the pain?  (e.g., Scale 1-10; mild, moderate, or severe)     moderate 7. RECURRENT SYMPTOM: Have you ever had this type of pelvic pain before? If Yes, ask: When was the last time? and What happened that time?      Yes, UTI cystitis 8. CAUSE: What do you think is causing the pelvic pain?     Has been diagnosed with cystitis after recurrent UTI's  10. OTHER SYMPTOMS: Has there been any other symptoms? (e.g., fever, constipation, diarrhea, urine problems, vaginal bleeding, vaginal discharge, or vomiting?       Burning at times  Protocols used: Pelvic Pain - [facility]

## 2024-09-27 ENCOUNTER — Other Ambulatory Visit: Payer: Self-pay

## 2024-09-27 ENCOUNTER — Other Ambulatory Visit (HOSPITAL_COMMUNITY): Payer: Self-pay

## 2024-09-27 MED ORDER — CARVEDILOL 25 MG PO TABS
25.0000 mg | ORAL_TABLET | Freq: Two times a day (BID) | ORAL | 0 refills | Status: AC
  Filled 2024-09-27: qty 180, 90d supply, fill #0

## 2024-09-27 MED ORDER — AMLODIPINE BESYLATE 5 MG PO TABS
5.0000 mg | ORAL_TABLET | Freq: Every day | ORAL | 0 refills | Status: AC
  Filled 2024-09-27: qty 90, 90d supply, fill #0

## 2024-09-27 NOTE — Addendum Note (Signed)
 Addended by: MEMORY DELON POUR on: 09/27/2024 07:27 AM   Modules accepted: Orders

## 2024-09-27 NOTE — Telephone Encounter (Signed)
 Pt scheduled to see Dr. Mona 12/22/24, refills sent.

## 2024-09-29 ENCOUNTER — Ambulatory Visit: Admitting: Adult Health

## 2024-09-29 ENCOUNTER — Other Ambulatory Visit (HOSPITAL_COMMUNITY): Payer: Self-pay

## 2024-09-29 ENCOUNTER — Other Ambulatory Visit: Payer: Self-pay

## 2024-09-29 ENCOUNTER — Encounter: Payer: Self-pay | Admitting: Adult Health

## 2024-09-29 VITALS — BP 110/70 | HR 66 | Temp 98.4°F | Ht 63.0 in | Wt 192.0 lb

## 2024-09-29 DIAGNOSIS — N3010 Interstitial cystitis (chronic) without hematuria: Secondary | ICD-10-CM | POA: Diagnosis not present

## 2024-09-29 LAB — POCT URINALYSIS DIPSTICK
Bilirubin, UA: NEGATIVE
Blood, UA: NEGATIVE
Glucose, UA: NEGATIVE
Ketones, UA: NEGATIVE
Leukocytes, UA: NEGATIVE
Nitrite, UA: NEGATIVE
Protein, UA: NEGATIVE
Spec Grav, UA: 1.02 (ref 1.010–1.025)
Urobilinogen, UA: 0.2 U/dL
pH, UA: 5.5 (ref 5.0–8.0)

## 2024-09-29 MED ORDER — HYDROXYZINE PAMOATE 50 MG PO CAPS
50.0000 mg | ORAL_CAPSULE | Freq: Every day | ORAL | 1 refills | Status: AC
  Filled 2024-09-29 – 2024-10-18 (×3): qty 90, 90d supply, fill #0

## 2024-09-29 NOTE — Progress Notes (Signed)
 Subjective:    Patient ID: Amber Brewer, female    DOB: 03/21/1951, 73 y.o.   MRN: [id number]  HPI  Discussed the use of AI scribe software for clinical note transcription with the patient, who gave verbal consent to proceed.  History of Present Illness   Amber Brewer is a 73 year old female who presents for follow up.    She was seen about a month ago for follow up after being seen by another provider for urinary burning, urgency, pelvic pain with occasional radiation, and cramping that began in October. Earlier pain was sharp, worse with bending, and distinct from her usual UTI-type urgency. Urinalysis showed no bacteria.   She was placed on Elavil  10 mg for IC about a month ago but had to stop this  due to motor impairment and feeling out of control. She was then sent in hydroxzine 50 mg QHS about a week ago an since starting this she has had daily improvement in her symptoms and is no longer experiencing symptoms.       Review of Systems See HPI   Past Medical History:  Diagnosis Date   Allergy    Anemia    age 68 only   Asthma    Bowen's disease    ~ 17 yrs ago   Chicken pox    DDD (degenerative disc disease), cervical    Depression    Diabetes mellitus without complication (HCC)    on metformin  - goes back andf forth   Gallstones    GERD (gastroesophageal reflux disease)    Hx of melanoma in situ 2002   Hx: UTI (urinary tract infection)    Hyperlipidemia    Hypertension    Hypothyroidism    IBS (irritable bowel syndrome)    with D   Melanoma (HCC)    x2    Nephrolithiasis    Osteoarthritis    PVC's (premature ventricular contractions)    Status post dilation of esophageal narrowing     Social History   Socioeconomic History   Marital status: Married    Spouse name: Not on file   Number of children: 2   Years of education: 13   Highest education level: Not on file  Occupational History   Occupation: licensed optician/retired    Employer:  OTHER    Comment: Paediatric Nurse  Tobacco Use   Smoking status: Never   Smokeless tobacco: Never  Vaping Use   Vaping status: Never Used  Substance and Sexual Activity   Alcohol use: No   Drug use: No   Sexual activity: Yes    Birth control/protection: Post-menopausal, Surgical  Other Topics Concern   Not on file  Social History Narrative   epworth sleepiness scale = 8 (01/18/2016)   Lives at home with her husband and disabled son   Right handed   Caffeine: decaf   Social Drivers of Corporate Investment Banker Strain: Low Risk  (06/09/2024)   Overall Financial Resource Strain (CARDIA)    Difficulty of Paying Living Expenses: Not hard at all  Food Insecurity: No Food Insecurity (06/09/2024)   Hunger Vital Sign    Worried About Running Out of Food in the Last Year: Never true    Ran Out of Food in the Last Year: Never true  Transportation Needs: No Transportation Needs (06/09/2024)   PRAPARE - Administrator, Civil Service (Medical): No    Lack of Transportation (Non-Medical): No  Physical Activity: Insufficiently  Active (06/09/2024)   Exercise Vital Sign    Days of Exercise per Week: 3 days    Minutes of Exercise per Session: 30 min  Stress: Stress Concern Present (06/09/2024)   [employer] of Occupational Health - Occupational Stress Questionnaire    Feeling of Stress: To some extent  Social Connections: Socially Integrated (06/09/2024)   Social Connection and Isolation Panel    Frequency of Communication with Friends and Family: More than three times a week    Frequency of Social Gatherings with Friends and Family: More than three times a week    Attends Religious Services: More than 4 times per year    Active Member of [employer] or Organizations: Yes    Attends Engineer, Structural: More than 4 times per year    Marital Status: Married  Catering Manager Violence: Not At Risk (06/09/2024)   Humiliation, Afraid, Rape, and Kick questionnaire    Fear of  Current or Ex-Partner: No    Emotionally Abused: No    Physically Abused: No    Sexually Abused: No    Past Surgical History:  Procedure Laterality Date   ABDOMINAL HYSTERECTOMY  1974   APPENDECTOMY  1977   BLADDER SURGERY  1974   BREAST EXCISIONAL BIOPSY     CATARACT EXTRACTION Bilateral 2015   CHOLECYSTECTOMY  1987   COLONOSCOPY     RECTAL SURGERY     ~17 yrs ago- Bowens dx per pt with surgery   removal of melanoma in situ      Family History  Problem Relation Age of Onset   Hypertension Mother    Thyroid  disease Mother    Arthritis Mother    Depression Mother    Hearing loss Mother    Miscarriages / Stillbirths Mother    Heart attack Father    Heart disease Father    Alcohol abuse Father    Arthritis Father    Depression Father    Drug abuse Father    Hypertension Father    Alcohol abuse Sister    Arthritis Sister    Depression Sister    Hearing loss Sister    Hypotension Sister    Depression Sister    Heart disease Sister    Hyperlipidemia Sister    Hypertension Maternal Aunt    Hypertension Maternal Uncle    Clotting disorder Maternal Uncle    Prostate cancer Maternal Uncle    Colon cancer Maternal Uncle    Colon polyps Maternal Uncle    Diabetes Maternal Uncle        all uncles   Arthritis Maternal Grandmother    Asthma Maternal Grandmother    Breast cancer Maternal Grandmother    Stomach cancer Maternal Grandmother    Hearing loss Maternal Grandmother    Diabetes Maternal Grandfather    Heart disease Maternal Grandfather    Arthritis Maternal Grandfather    Hearing loss Maternal Grandfather    Hypertension Maternal Grandfather    Kidney disease Maternal Grandfather    Hyperlipidemia Paternal Grandmother        also MI   Breast cancer Paternal Grandmother    Arthritis Paternal Grandmother    Heart attack Paternal Grandmother    Hyperlipidemia Paternal Grandfather        also MI   Heart disease Paternal Grandfather    Heart attack Paternal  Grandfather    Endometriosis Daughter    Hypertension Son    Esophageal cancer Neg Hx    Rectal cancer Neg Hx  Allergies  Allergen Reactions   Prednisone Shortness Of Breath   Codeine Phosphate Nausea And Vomiting   Sulfa Antibiotics Nausea And Vomiting    Current Outpatient Medications on File Prior to Visit  Medication Sig Dispense Refill   albuterol  (VENTOLIN  HFA) 108 (90 Base) MCG/ACT inhaler Inhale 2 puffs into the lungs every 4 (four) hours as needed for wheezing or shortness of breath. 6.7 g 1   amLODipine  (NORVASC ) 5 MG tablet Take 1 tablet (5 mg total) by mouth daily. 90 tablet 0   Azelaic Acid 15 % gel Apply 1 Application topically 2 (two) times daily.     Biotin 1000 MCG CHEW Chew by mouth.     blood glucose meter kit and supplies Dispense based on patient and insurance preference. Use up to four times daily as directed. (FOR ICD-10 E10.9, E11.9). 1 each 0   budesonide (RHINOCORT AQUA) 32 MCG/ACT nasal spray Place 1 spray into both nostrils daily as needed for allergies.      calcium  carbonate (OSCAL) 1500 (600 Ca) MG TABS tablet Take by mouth 2 (two) times daily with a meal.     carvedilol  (COREG ) 25 MG tablet Take 1 tablet (25 mg total) by mouth 2 (two) times daily. 180 tablet 0   Cholecalciferol  (VITAMIN D) 50 MCG (2000 UT) tablet Take 2,000 Units by mouth daily.     Cholecalciferol  (VITAMIN D3) 50 MCG (2000 UT) capsule Take 1 capsule (2,000 Units total) by mouth daily. 90 capsule 3   citalopram  (CELEXA ) 10 MG tablet Take 1 tablet (10 mg total) by mouth every morning. 90 tablet 1   Continuous Glucose Receiver (FREESTYLE LIBRE 3 READER) DEVI Use with libre sensor to check blood sugar 1 each 0   Continuous Glucose Sensor (FREESTYLE LIBRE 3 PLUS SENSOR) MISC Change sensor every 15 days. 6 each 3   cycloSPORINE  (RESTASIS ) 0.05 % ophthalmic emulsion Place 1 drop into both eyes 2 (two) times daily.     estradiol  (VIVELLE -DOT) 0.05 MG/24HR patch Place 1 patch (0.05 mg total)  onto the skin 2 (two) times a week. 8 patch 10   famotidine  (PEPCID ) 40 MG tablet Take 1 tablet (40 mg total) by mouth daily before breakfast. 90 tablet 3   Fluticasone -Umeclidin-Vilant (TRELEGY ELLIPTA ) 200-62.5-25 MCG/ACT AEPB Inhale 1 puff into the lungs every other day. 84 each 3   hydrochlorothiazide  (HYDRODIURIL ) 25 MG tablet Take 1 tablet (25 mg total) by mouth every morning. 90 tablet 3   irbesartan  (AVAPRO ) 150 MG tablet Take 1 tablet (150 mg total) by mouth in the morning and at bedtime. 180 tablet 3   Lancets (ONETOUCH DELICA PLUS LANCET33G) MISC USE TO check blood glucose UP TO four times daily AS DIRECTED 100 each 0   levothyroxine  (SYNTHROID ) 50 MCG tablet Take 1 tablet (50 mcg total) by mouth every morning. 90 tablet 0   loratadine (CLARITIN) 5 MG chewable tablet Chew 5 mg by mouth daily.     metFORMIN  (GLUCOPHAGE ) 500 MG tablet Take 1 tablet (500 mg total) by mouth 2 (two) times daily with a meal. 180 tablet 11   metroNIDAZOLE  (METROGEL ) 0.75 % gel Apply 1 Application topically 2 (two) times daily. 45 g 0   metroNIDAZOLE  (METROGEL ) 0.75 % gel Apply topically 2 (two) times daily. 45 g 0   minoxidil (ROGAINE) 2 % external solution Apply topically daily.     ONETOUCH ULTRA test strip USE TO check blood glucose UP TO four times daily AS DIRECTED 100 strip 0  Oyster Shell 500 MG TABS Take 1 tablet (500 mg total) by mouth 2 (two) times daily. 180 tablet 3   potassium chloride  (KLOR-CON ) 10 MEQ tablet Take 1 tablet (10 mEq total) by mouth every evening. 90 tablet 3   rosuvastatin  (CRESTOR ) 10 MG tablet Take 1 tablet (10 mg total) by mouth every other evening. 90 tablet 2   triamcinolone  (NASACORT ) 55 MCG/ACT AERO nasal inhaler Place 1 spray into the nose 2 (two) times daily. 50.7 mL 3   Turmeric 500 MG CAPS SMARTSIG:1 Capsule(s) By Mouth Every Evening     [DISCONTINUED] cetirizine  (ZYRTEC ) 10 MG tablet Take 1 tablet (10 mg total) by mouth daily as needed (Can take an extra dose during  flare ups.). 180 tablet 1   No current facility-administered medications on file prior to visit.    BP 110/70   Pulse 66   Temp 98.4 F (36.9 C) (Oral)   Ht 5' 3 (1.6 m)   Wt 192 lb (87.1 kg)   SpO2 97%   BMI 34.01 kg/m       Objective:   Physical Exam Vitals and nursing note reviewed.  Constitutional:      Appearance: Normal appearance.  Pulmonary:     Effort: Pulmonary effort is normal.     Breath sounds: Normal breath sounds.  Abdominal:     General: Abdomen is flat. Bowel sounds are normal.     Palpations: Abdomen is soft.     Tenderness: There is no abdominal tenderness.  Skin:    General: Skin is warm and dry.  Neurological:     General: No focal deficit present.     Mental Status: She is alert and oriented to person, place, and time.  Psychiatric:        Mood and Affect: Mood normal.        Behavior: Behavior normal.        Thought Content: Thought content normal.        Judgment: Judgment normal.       Assessment & Plan:  Assessment and Plan    Interstitial cystitis (chronic) Chronic interstitial cystitis with dysuria, urgency, pelvic pain. Symptoms improved with hydroxyzine . No bacterial infection in urinalysis. Discussed long-term hydroxyzine  use if symptoms return. Considered pelvic floor physical therapy to reduce medication dependency. - Continue hydroxyzine  for three months and then can try stopping it.  - Consider long-term hydroxyzine  if symptoms return. - Discuss pelvic floor physical therapy if symptoms persist and she is interested in this but would like to wait      Amber Quinnell, NP  I personally spent a total of 31 minutes in the care of the patient today including preparing to see the patient, getting/reviewing separately obtained history, performing a medically appropriate exam/evaluation, counseling and educating, placing orders, and documenting clinical information in the EHR.

## 2024-09-30 ENCOUNTER — Other Ambulatory Visit (HOSPITAL_COMMUNITY): Payer: Self-pay

## 2024-10-17 ENCOUNTER — Other Ambulatory Visit (HOSPITAL_COMMUNITY): Payer: Self-pay

## 2024-10-18 ENCOUNTER — Other Ambulatory Visit: Payer: Self-pay

## 2024-10-18 ENCOUNTER — Other Ambulatory Visit (HOSPITAL_COMMUNITY): Payer: Self-pay

## 2024-10-18 ENCOUNTER — Other Ambulatory Visit: Payer: Self-pay | Admitting: Allergy and Immunology

## 2024-10-18 MED ORDER — TRELEGY ELLIPTA 200-62.5-25 MCG/ACT IN AEPB
1.0000 | INHALATION_SPRAY | RESPIRATORY_TRACT | 3 refills | Status: AC
  Filled 2024-10-18: qty 60, 60d supply, fill #0

## 2024-10-19 ENCOUNTER — Other Ambulatory Visit (HOSPITAL_COMMUNITY): Payer: Self-pay

## 2024-10-19 ENCOUNTER — Other Ambulatory Visit: Payer: Self-pay

## 2024-10-22 ENCOUNTER — Other Ambulatory Visit (HOSPITAL_COMMUNITY): Payer: Self-pay

## 2024-10-29 ENCOUNTER — Other Ambulatory Visit (HOSPITAL_COMMUNITY): Payer: Self-pay

## 2024-10-29 ENCOUNTER — Other Ambulatory Visit: Payer: Self-pay

## 2024-11-03 ENCOUNTER — Encounter: Payer: Self-pay | Admitting: Adult Health

## 2024-11-03 ENCOUNTER — Ambulatory Visit: Admitting: Adult Health

## 2024-11-03 VITALS — BP 122/74 | HR 64 | Temp 97.8°F | Ht 63.0 in | Wt 194.0 lb

## 2024-11-03 DIAGNOSIS — E119 Type 2 diabetes mellitus without complications: Secondary | ICD-10-CM

## 2024-11-03 DIAGNOSIS — I10 Essential (primary) hypertension: Secondary | ICD-10-CM | POA: Diagnosis not present

## 2024-11-03 DIAGNOSIS — Z7984 Long term (current) use of oral hypoglycemic drugs: Secondary | ICD-10-CM

## 2024-11-03 DIAGNOSIS — N3010 Interstitial cystitis (chronic) without hematuria: Secondary | ICD-10-CM

## 2024-11-03 LAB — POCT GLYCOSYLATED HEMOGLOBIN (HGB A1C): Hemoglobin A1C: 5.9 % — AB (ref 4.0–5.6)

## 2024-11-03 NOTE — Progress Notes (Signed)
 "  Subjective:    Patient ID: Amber Brewer, female    DOB: 1951/09/11, 74 y.o.   MRN: [id number]  HPI 74 year old female who  has a past medical history of Allergy, Anemia, Asthma, Bowen's disease, Chicken pox, DDD (degenerative disc disease), cervical, Depression, Diabetes mellitus without complication (HCC), Gallstones, GERD (gastroesophageal reflux disease), melanoma in situ (2002), UTI (urinary tract infection), Hyperlipidemia, Hypertension, Hypothyroidism, IBS (irritable bowel syndrome), Melanoma (HCC), Nephrolithiasis, Osteoarthritis, PVC's (premature ventricular contractions), and Status post dilation of esophageal narrowing.  DM Type 2 -formally diagnosed in July 2023 at her CPE when her A1c was 6.9.  At this time she was on metformin  250 daily for prediabetes.  This was changed to 500 mg twice daily.  She has been tolerating this medication well and has been working on lifestyle modifications.  She continues to use the libre CGM but did not bring her reader with her.  Lab Results  Component Value Date   HGBA1C 5.9 (A) 11/03/2024   HGBA1C 6.8 (H) 05/20/2024   HGBA1C 6.0 (A) 02/18/2024   Hypertension-managed with Norvasc  5 mg daily, Coreg  25 mg twice daily, HCTZ 25 mg daily, and Avapro  150 mg daily.  He does take a potassium supplement of 10 meq .  She checks her blood pressures at home with readings mostly in the 120s over 70s BP Readings from Last 3 Encounters:  11/03/24 122/74  09/29/24 110/70  09/03/24 120/70   IC - she was tried Elavil  but this was stopped due to motor impairment and she was switched to Atarax  which worked well for her until recently when she developed diarrhea. She stopped the medication and the diarrhea went away. Unfortunately, her pain has come back. Urination helps to some degree but the pain really does not go away.    Review of Systems See HPI   Past Medical History:  Diagnosis Date   Allergy    Anemia    age 9 only   Asthma    Bowen's disease     ~ 17 yrs ago   Chicken pox    DDD (degenerative disc disease), cervical    Depression    Diabetes mellitus without complication (HCC)    on metformin  - goes back andf forth   Gallstones    GERD (gastroesophageal reflux disease)    Hx of melanoma in situ 2002   Hx: UTI (urinary tract infection)    Hyperlipidemia    Hypertension    Hypothyroidism    IBS (irritable bowel syndrome)    with D   Melanoma (HCC)    x2    Nephrolithiasis    Osteoarthritis    PVC's (premature ventricular contractions)    Status post dilation of esophageal narrowing     Social History   Socioeconomic History   Marital status: Married    Spouse name: Not on file   Number of children: 2   Years of education: 13   Highest education level: Not on file  Occupational History   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  Tobacco Use   Smoking status: Never   Smokeless tobacco: Never  Vaping Use   Vaping status: Never Used  Substance and Sexual Activity   Alcohol use: No   Drug use: No   Sexual activity: Yes    Birth control/protection: Post-menopausal, Surgical  Other Topics Concern   Not on file  Social History Narrative   epworth sleepiness scale =  8 (01/18/2016)   Lives at home with her husband and disabled son   Right handed   Caffeine: decaf   Social Drivers of Health   Tobacco Use: Low Risk (11/03/2024)   Patient History    Smoking Tobacco Use: Never    Smokeless Tobacco Use: Never    Passive Exposure: Not on file  Financial Resource Strain: Low Risk (06/09/2024)   Overall Financial Resource Strain (CARDIA)    Difficulty of Paying Living Expenses: Not hard at all  Food Insecurity: No Food Insecurity (06/09/2024)   Epic    Worried About Radiation Protection Practitioner of Food in the Last Year: Never true    Ran Out of Food in the Last Year: Never true  Transportation Needs: No Transportation Needs (06/09/2024)   Epic    Lack of Transportation (Medical): No     Lack of Transportation (Non-Medical): No  Physical Activity: Insufficiently Active (06/09/2024)   Exercise Vital Sign    Days of Exercise per Week: 3 days    Minutes of Exercise per Session: 30 min  Stress: Stress Concern Present (06/09/2024)   [employer] of Occupational Health - Occupational Stress Questionnaire    Feeling of Stress: To some extent  Social Connections: Socially Integrated (06/09/2024)   Social Connection and Isolation Panel    Frequency of Communication with Friends and Family: More than three times a week    Frequency of Social Gatherings with Friends and Family: More than three times a week    Attends Religious Services: More than 4 times per year    Active Member of [employer] or Organizations: Yes    Attends Banker Meetings: More than 4 times per year    Marital Status: Married  Catering Manager Violence: Not At Risk (06/09/2024)   Epic    Fear of Current or Ex-Partner: No    Emotionally Abused: No    Physically Abused: No    Sexually Abused: No  Depression (PHQ2-9): Low Risk (09/29/2024)   Depression (PHQ2-9)    PHQ-2 Score: 0  Alcohol Screen: Low Risk (06/09/2024)   Alcohol Screen    Last Alcohol Screening Score (AUDIT): 0  Housing: Unknown (06/09/2024)   Epic    Unable to Pay for Housing in the Last Year: No    Number of Times Moved in the Last Year: Not on file    Homeless in the Last Year: No  Utilities: Not At Risk (06/09/2024)   Epic    Threatened with loss of utilities: No  Health Literacy: Adequate Health Literacy (06/09/2024)   B1300 Health Literacy    Frequency of need for help with medical instructions: Never    Past Surgical History:  Procedure Laterality Date   ABDOMINAL HYSTERECTOMY  1974   APPENDECTOMY  1977   BLADDER SURGERY  1974   BREAST EXCISIONAL BIOPSY     CATARACT EXTRACTION Bilateral 2015   CHOLECYSTECTOMY  1987   COLONOSCOPY     RECTAL SURGERY     ~17 yrs ago- Bowens dx per pt with surgery   removal of melanoma  in situ      Family History  Problem Relation Age of Onset   Hypertension Mother    Thyroid  disease Mother    Arthritis Mother    Depression Mother    Hearing loss Mother    Miscarriages / Stillbirths Mother    Heart attack Father    Heart disease Father    Alcohol abuse Father    Arthritis Father  Depression Father    Drug abuse Father    Hypertension Father    Alcohol abuse Sister    Arthritis Sister    Depression Sister    Hearing loss Sister    Hypotension Sister    Depression Sister    Heart disease Sister    Hyperlipidemia Sister    Hypertension Maternal Aunt    Hypertension Maternal Uncle    Clotting disorder Maternal Uncle    Prostate cancer Maternal Uncle    Colon cancer Maternal Uncle    Colon polyps Maternal Uncle    Diabetes Maternal Uncle        all uncles   Arthritis Maternal Grandmother    Asthma Maternal Grandmother    Breast cancer Maternal Grandmother    Stomach cancer Maternal Grandmother    Hearing loss Maternal Grandmother    Diabetes Maternal Grandfather    Heart disease Maternal Grandfather    Arthritis Maternal Grandfather    Hearing loss Maternal Grandfather    Hypertension Maternal Grandfather    Kidney disease Maternal Grandfather    Hyperlipidemia Paternal Grandmother        also MI   Breast cancer Paternal Grandmother    Arthritis Paternal Grandmother    Heart attack Paternal Grandmother    Hyperlipidemia Paternal Grandfather        also MI   Heart disease Paternal Grandfather    Heart attack Paternal Grandfather    Endometriosis Daughter    Hypertension Son    Esophageal cancer Neg Hx    Rectal cancer Neg Hx     Allergies[1]  Medications Ordered Prior to Encounter[2]  BP 122/74   Pulse 64   Temp 97.8 F (36.6 C) (Oral)   Ht 5' 3 (1.6 m)   Wt 194 lb (88 kg)   SpO2 94%   BMI 34.37 kg/m       Objective:   Physical Exam Vitals and nursing note reviewed.  Constitutional:      Appearance: Normal appearance.   Cardiovascular:     Rate and Rhythm: Normal rate and regular rhythm.     Pulses: Normal pulses.     Heart sounds: Normal heart sounds.  Pulmonary:     Effort: Pulmonary effort is normal.     Breath sounds: Normal breath sounds.  Musculoskeletal:        General: Normal range of motion.  Skin:    General: Skin is warm and dry.  Neurological:     General: No focal deficit present.     Mental Status: She is alert and oriented to person, place, and time.  Psychiatric:        Mood and Affect: Mood normal.        Behavior: Behavior normal.        Thought Content: Thought content normal.        Judgment: Judgment normal.        Assessment & Plan:  1. Diabetes mellitus treated with oral medication (HCC) (Primary)  - POC HgB A1c- 5.9 - has improved. Continue with Metformin  500 mg BID  - Follow up in 3 months  2. Essential hypertension - Well controlled.   3. IC (interstitial cystitis) - She wants to try Claritin which I am fine with, there is some evidence that it can help with IC  - We again discussed Pelvic floor PT and she is not ready to do this  - She can follow up as needed and will reevaluate at her next appointment  Darleene Shape, NP       [1]  Allergies Allergen Reactions   Prednisone Shortness Of Breath   Codeine Phosphate Nausea And Vomiting   Sulfa Antibiotics Nausea And Vomiting  [2]  Current Outpatient Medications on File Prior to Visit  Medication Sig Dispense Refill   albuterol  (VENTOLIN  HFA) 108 (90 Base) MCG/ACT inhaler Inhale 2 puffs into the lungs every 4 (four) hours as needed for wheezing or shortness of breath. 6.7 g 1   amLODipine  (NORVASC ) 5 MG tablet Take 1 tablet (5 mg total) by mouth daily. 90 tablet 0   blood glucose meter kit and supplies Dispense based on patient and insurance preference. Use up to four times daily as directed. (FOR ICD-10 E10.9, E11.9). 1 each 0   budesonide (RHINOCORT AQUA) 32 MCG/ACT nasal spray Place 1 spray into both  nostrils daily as needed for allergies.      calcium  carbonate (OSCAL) 1500 (600 Ca) MG TABS tablet Take by mouth 2 (two) times daily with a meal.     carvedilol  (COREG ) 25 MG tablet Take 1 tablet (25 mg total) by mouth 2 (two) times daily. 180 tablet 0   Cholecalciferol  (VITAMIN D) 50 MCG (2000 UT) tablet Take 2,000 Units by mouth daily.     Cholecalciferol  (VITAMIN D3) 50 MCG (2000 UT) capsule Take 1 capsule (2,000 Units total) by mouth daily. 90 capsule 3   citalopram  (CELEXA ) 10 MG tablet Take 1 tablet (10 mg total) by mouth every morning. 90 tablet 1   Continuous Glucose Receiver (FREESTYLE LIBRE 3 READER) DEVI Use with libre sensor to check blood sugar 1 each 0   Continuous Glucose Sensor (FREESTYLE LIBRE 3 PLUS SENSOR) MISC Change sensor every 15 days. 6 each 3   cycloSPORINE  (RESTASIS ) 0.05 % ophthalmic emulsion Place 1 drop into both eyes 2 (two) times daily.     estradiol  (VIVELLE -DOT) 0.05 MG/24HR patch Place 1 patch (0.05 mg total) onto the skin 2 (two) times a week. 8 patch 10   famotidine  (PEPCID ) 40 MG tablet Take 1 tablet (40 mg total) by mouth daily before breakfast. 90 tablet 3   Fluticasone -Umeclidin-Vilant (TRELEGY ELLIPTA ) 200-62.5-25 MCG/ACT AEPB Inhale 1 puff into the lungs every other day. 60 each 3   hydrochlorothiazide  (HYDRODIURIL ) 25 MG tablet Take 1 tablet (25 mg total) by mouth every morning. 90 tablet 3   hydrOXYzine  (VISTARIL ) 50 MG capsule Take 1 capsule (50 mg total) by mouth at bedtime. 90 capsule 1   irbesartan  (AVAPRO ) 150 MG tablet Take 1 tablet (150 mg total) by mouth in the morning and at bedtime. 180 tablet 3   Lancets (ONETOUCH DELICA PLUS LANCET33G) MISC USE TO check blood glucose UP TO four times daily AS DIRECTED 100 each 0   levothyroxine  (SYNTHROID ) 50 MCG tablet Take 1 tablet (50 mcg total) by mouth every morning. 90 tablet 0   loratadine (CLARITIN) 5 MG chewable tablet Chew 5 mg by mouth daily.     metFORMIN  (GLUCOPHAGE ) 500 MG tablet Take 1 tablet  (500 mg total) by mouth 2 (two) times daily with a meal. 180 tablet 11   metroNIDAZOLE  (METROGEL ) 0.75 % gel Apply 1 Application topically 2 (two) times daily. 45 g 0   metroNIDAZOLE  (METROGEL ) 0.75 % gel Apply topically 2 (two) times daily. 45 g 0   minoxidil (ROGAINE) 2 % external solution Apply topically daily.     ONETOUCH ULTRA test strip USE TO check blood glucose UP TO four times daily AS DIRECTED 100 strip  0   Oyster Shell 500 MG TABS Take 1 tablet (500 mg total) by mouth 2 (two) times daily. 180 tablet 3   potassium chloride  (KLOR-CON ) 10 MEQ tablet Take 1 tablet (10 mEq total) by mouth every evening. 90 tablet 3   rosuvastatin  (CRESTOR ) 10 MG tablet Take 1 tablet (10 mg total) by mouth every other evening. 90 tablet 2   triamcinolone  (NASACORT ) 55 MCG/ACT AERO nasal inhaler Place 1 spray into the nose 2 (two) times daily. 50.7 mL 3   Turmeric 500 MG CAPS SMARTSIG:1 Capsule(s) By Mouth Every Evening     Azelaic Acid 15 % gel Apply 1 Application topically 2 (two) times daily. (Patient not taking: Reported on 11/03/2024)     [DISCONTINUED] cetirizine  (ZYRTEC ) 10 MG tablet Take 1 tablet (10 mg total) by mouth daily as needed (Can take an extra dose during flare ups.). 180 tablet 1   No current facility-administered medications on file prior to visit.   "

## 2024-11-03 NOTE — Patient Instructions (Signed)
 Health Maintenance Due  Topic Date Due   Zoster Vaccines- Shingrix (1 of 2) Never done   DTaP/Tdap/Td (3 - Td or Tdap) 01/28/2021       09/29/2024   10:27 AM 06/09/2024    9:53 AM 05/20/2024    1:02 PM  Depression screen PHQ 2/9  Decreased Interest 0 0 3  Down, Depressed, Hopeless 0 0 3  PHQ - 2 Score 0 0 6  Altered sleeping 0 0 2  Tired, decreased energy 0 0 3  Change in appetite 0 0 0  Feeling bad or failure about yourself  0 0 1  Trouble concentrating 0 0 2  Moving slowly or fidgety/restless 0 0 0  Suicidal thoughts 0 0 0  PHQ-9 Score 0 0  14   Difficult doing work/chores Not difficult at all Not difficult at all Somewhat difficult     Data saved with a previous flowsheet row definition

## 2024-11-08 ENCOUNTER — Other Ambulatory Visit: Payer: Self-pay

## 2024-11-10 ENCOUNTER — Other Ambulatory Visit: Payer: Self-pay | Admitting: Adult Health

## 2024-11-10 ENCOUNTER — Other Ambulatory Visit: Payer: Self-pay

## 2024-11-10 ENCOUNTER — Other Ambulatory Visit (HOSPITAL_COMMUNITY): Payer: Self-pay

## 2024-11-10 MED ORDER — LEVOTHYROXINE SODIUM 50 MCG PO TABS
50.0000 ug | ORAL_TABLET | Freq: Every morning | ORAL | 0 refills | Status: AC
  Filled 2024-11-10 (×2): qty 90, 90d supply, fill #0

## 2024-11-12 ENCOUNTER — Other Ambulatory Visit (HOSPITAL_COMMUNITY): Payer: Self-pay

## 2024-11-16 ENCOUNTER — Other Ambulatory Visit (HOSPITAL_COMMUNITY): Payer: Self-pay

## 2024-11-16 ENCOUNTER — Other Ambulatory Visit: Payer: Self-pay | Admitting: Internal Medicine

## 2024-11-16 ENCOUNTER — Other Ambulatory Visit: Payer: Self-pay

## 2024-11-16 DIAGNOSIS — I10 Essential (primary) hypertension: Secondary | ICD-10-CM

## 2024-11-16 MED ORDER — IRBESARTAN 150 MG PO TABS
150.0000 mg | ORAL_TABLET | Freq: Two times a day (BID) | ORAL | 0 refills | Status: AC
  Filled 2024-11-16 – 2024-11-17 (×2): qty 60, 30d supply, fill #0

## 2024-11-17 ENCOUNTER — Other Ambulatory Visit (HOSPITAL_COMMUNITY): Payer: Self-pay

## 2024-11-17 ENCOUNTER — Other Ambulatory Visit: Payer: Self-pay

## 2024-11-18 ENCOUNTER — Other Ambulatory Visit (HOSPITAL_COMMUNITY): Payer: Self-pay

## 2024-11-26 ENCOUNTER — Other Ambulatory Visit: Payer: Self-pay

## 2024-12-14 ENCOUNTER — Ambulatory Visit: Payer: PPO | Admitting: Allergy and Immunology

## 2024-12-21 ENCOUNTER — Ambulatory Visit: Admitting: Allergy and Immunology

## 2024-12-22 ENCOUNTER — Ambulatory Visit: Admitting: Internal Medicine

## 2025-02-01 ENCOUNTER — Ambulatory Visit: Admitting: Adult Health

## 2025-06-15 ENCOUNTER — Ambulatory Visit
